# Patient Record
Sex: Male | Born: 1950 | Race: Black or African American | Hispanic: No | Smoking: Current some day smoker
Health system: Southern US, Community
[De-identification: ages and names within clinical notes are randomized; demographics above are authoritative.]

## PROBLEM LIST (undated history)

## (undated) DIAGNOSIS — E7800 Pure hypercholesterolemia, unspecified: Secondary | ICD-10-CM

## (undated) DIAGNOSIS — K219 Gastro-esophageal reflux disease without esophagitis: Secondary | ICD-10-CM

## (undated) DIAGNOSIS — C801 Malignant (primary) neoplasm, unspecified: Secondary | ICD-10-CM

## (undated) DIAGNOSIS — I10 Essential (primary) hypertension: Secondary | ICD-10-CM

## (undated) DIAGNOSIS — N189 Chronic kidney disease, unspecified: Secondary | ICD-10-CM

## (undated) DIAGNOSIS — J449 Chronic obstructive pulmonary disease, unspecified: Secondary | ICD-10-CM

## (undated) DIAGNOSIS — M1990 Unspecified osteoarthritis, unspecified site: Secondary | ICD-10-CM

## (undated) HISTORY — PX: KNEE SURGERY: SHX244

## (undated) HISTORY — PX: TOTAL SHOULDER ARTHROPLASTY: SHX126

## (undated) HISTORY — PX: VASCULAR SURGERY: SHX849

## (undated) HISTORY — PX: CARDIAC CATHETERIZATION: SHX172

## (undated) HISTORY — PX: COLON RESECTION: SHX5231

## (undated) HISTORY — PX: TOTAL HIP ARTHROPLASTY: SHX124

## (undated) HISTORY — PX: JOINT REPLACEMENT: SHX530

## (undated) HISTORY — PX: COLON SURGERY: SHX602

---

## 1999-02-28 ENCOUNTER — Emergency Department (HOSPITAL_COMMUNITY): Admission: EM | Admit: 1999-02-28 | Discharge: 1999-02-28 | Payer: Self-pay | Admitting: Emergency Medicine

## 1999-03-01 ENCOUNTER — Encounter: Payer: Self-pay | Admitting: Emergency Medicine

## 1999-03-01 ENCOUNTER — Emergency Department (HOSPITAL_COMMUNITY): Admission: EM | Admit: 1999-03-01 | Discharge: 1999-03-01 | Payer: Self-pay | Admitting: Emergency Medicine

## 1999-03-05 ENCOUNTER — Emergency Department (HOSPITAL_COMMUNITY): Admission: EM | Admit: 1999-03-05 | Discharge: 1999-03-05 | Payer: Self-pay | Admitting: Emergency Medicine

## 1999-04-23 ENCOUNTER — Ambulatory Visit (HOSPITAL_BASED_OUTPATIENT_CLINIC_OR_DEPARTMENT_OTHER): Admission: RE | Admit: 1999-04-23 | Discharge: 1999-04-24 | Payer: Self-pay | Admitting: Orthopedic Surgery

## 2001-12-15 ENCOUNTER — Encounter: Payer: Self-pay | Admitting: Surgery

## 2001-12-15 ENCOUNTER — Inpatient Hospital Stay (HOSPITAL_COMMUNITY): Admission: RE | Admit: 2001-12-15 | Discharge: 2001-12-18 | Payer: Self-pay | Admitting: Surgery

## 2004-05-12 ENCOUNTER — Ambulatory Visit: Payer: Self-pay | Admitting: Internal Medicine

## 2004-05-27 ENCOUNTER — Ambulatory Visit: Payer: Self-pay | Admitting: Internal Medicine

## 2004-09-12 ENCOUNTER — Ambulatory Visit: Payer: Self-pay | Admitting: Internal Medicine

## 2004-10-23 ENCOUNTER — Ambulatory Visit: Payer: Self-pay | Admitting: Internal Medicine

## 2005-01-15 ENCOUNTER — Ambulatory Visit: Payer: Self-pay | Admitting: Internal Medicine

## 2005-01-22 ENCOUNTER — Ambulatory Visit: Payer: Self-pay | Admitting: Internal Medicine

## 2007-08-31 ENCOUNTER — Emergency Department (HOSPITAL_COMMUNITY): Admission: EM | Admit: 2007-08-31 | Discharge: 2007-08-31 | Payer: Self-pay | Admitting: Emergency Medicine

## 2007-09-09 ENCOUNTER — Inpatient Hospital Stay (HOSPITAL_COMMUNITY): Admission: AD | Admit: 2007-09-09 | Discharge: 2007-09-11 | Payer: Self-pay | Admitting: Orthopedic Surgery

## 2009-10-10 ENCOUNTER — Ambulatory Visit: Payer: Self-pay | Admitting: Internal Medicine

## 2009-10-10 DIAGNOSIS — E785 Hyperlipidemia, unspecified: Secondary | ICD-10-CM | POA: Insufficient documentation

## 2009-10-10 DIAGNOSIS — R7309 Other abnormal glucose: Secondary | ICD-10-CM

## 2009-10-10 DIAGNOSIS — I10 Essential (primary) hypertension: Secondary | ICD-10-CM

## 2009-10-10 DIAGNOSIS — F528 Other sexual dysfunction not due to a substance or known physiological condition: Secondary | ICD-10-CM | POA: Insufficient documentation

## 2009-10-10 DIAGNOSIS — Z860100 Personal history of colon polyps, unspecified: Secondary | ICD-10-CM | POA: Insufficient documentation

## 2009-10-11 LAB — CONVERTED CEMR LAB
ALT: 20 U/L
AST: 22 U/L
Albumin: 3.6 g/dL
Alkaline Phosphatase: 59 U/L
BUN: 22 mg/dL
Bilirubin, Direct: 0.2 mg/dL
CO2: 24 meq/L
Calcium: 9 mg/dL
Chloride: 105 meq/L
Cholesterol: 112 mg/dL
Creatinine, Ser: 2.3 mg/dL — ABNORMAL HIGH
Direct LDL: 58.7 mg/dL
GFR calc non Af Amer: 30.71 mL/min
Glucose, Bld: 191 mg/dL — ABNORMAL HIGH
HDL: 26.2 mg/dL — ABNORMAL LOW
Hgb A1c MFr Bld: 7.1 % — ABNORMAL HIGH
PSA: 10.43 ng/mL — ABNORMAL HIGH
Potassium: 4.3 meq/L
Sodium: 139 meq/L
TSH: 0.96 u[IU]/mL
Testosterone: 274 ng/dL — ABNORMAL LOW
Total Bilirubin: 0.5 mg/dL
Total CHOL/HDL Ratio: 4
Total Protein: 6.3 g/dL
Triglycerides: 275 mg/dL — ABNORMAL HIGH
VLDL: 55 mg/dL — ABNORMAL HIGH

## 2009-11-01 ENCOUNTER — Ambulatory Visit: Payer: Self-pay | Admitting: Internal Medicine

## 2009-11-04 ENCOUNTER — Encounter: Payer: Self-pay | Admitting: Internal Medicine

## 2009-11-04 DIAGNOSIS — R972 Elevated prostate specific antigen [PSA]: Secondary | ICD-10-CM

## 2009-11-04 LAB — CONVERTED CEMR LAB
PSA, Free Pct: 18 — ABNORMAL LOW
PSA, Free: 2.1 ng/mL
PSA: 11.92 ng/mL — ABNORMAL HIGH

## 2009-11-06 ENCOUNTER — Ambulatory Visit: Payer: Self-pay | Admitting: Internal Medicine

## 2009-11-06 DIAGNOSIS — N183 Chronic kidney disease, stage 3 (moderate): Secondary | ICD-10-CM

## 2010-03-11 NOTE — Miscellaneous (Signed)
Summary: Urology Referral Order

## 2010-03-11 NOTE — Assessment & Plan Note (Signed)
Summary: PT WILL COME IN FASTING OK PER DOC/NJR   Vital Signs:  Patient profile:   60 year old male Height:      70 inches Weight:      228 pounds BMI:     32.83 Pulse rate:   80 / minute Pulse rhythm:   regular Resp:     12 per minute BP sitting:   152 / 88  (left arm) Cuff size:   regular  Vitals Entered By: Gladis Riffle, RN Nov 09, 2009 10:01 AM)  CC: to re-establish and have ears checked for itching Is Patient Diabetic? No Comments medication list is from import list, pt not sure names but takes BP med, cholesterol med, and prediabetes med   CC:  to re-establish and have ears checked for itching.  History of Present Illness: I have not seen in several years DM---tolerating meds---has had some f/u at [facility]: tolerating meds  Lipids: tolerating meds, needs f/u  ED: requests more viagra  All other systems reviewed and were negative   Preventive Screening-Counseling & Management  Alcohol-Tobacco     Smoking Status: current     Packs/Day: <0.25  Comments: 1-2 cigs per day  Current Problems (verified): 1)  Erectile Dysfunction  (ICD-302.72) 2)  Preventive Health Care  (ICD-V70.0) 3)  Colonic Polyps, Hx of  (ICD-V12.72) 4)  Prediabetes  (ICD-790.29) 5)  Hypertension  (ICD-401.9) 6)  Hyperlipidemia  (ICD-272.4)  Current Medications (verified): 1)  Lisinopril 40 Mg Tabs (Lisinopril) .... Take 1 Tablet By Mouth Once A Day 2)  Simvastatin 40 Mg Tabs (Simvastatin) .... Take 1 Tablet By Mouth At Bedtime 3)  Metformin Hcl 500 Mg Tabs (Metformin Hcl) .... 1/2 Tablet Once A Day 4)  Viagra 100 Mg Tabs (Sildenafil Citrate) .... One Tablet As Needed  Allergies (verified): No Known Drug Allergies  Past History:  Family History: Last updated: 11-09-2009 father deceased age 78 yo stroke Mother deceased CHF age 44  Social History: Last updated: 11/09/2009 disability Divorced 4 dtrs healthy Current Smoker 1ppd  Risk Factors: Smoking Status: current  (11/09/2009) Packs/Day: <0.25 (11-09-2009)  Past Medical History: Hyperlipidemia Hypertension prediabetic Colonic polyps, hx of  Past Surgical History: Colon polypectomy Total hip replacement--bilateral Inguinal herniorrhaphy  Family History: father deceased age 71 yo stroke Mother deceased CHF age 31  Social History: disability Divorced 4 dtrs healthy Current Smoker 1ppdSmoking Status:  current Packs/Day:  <0.25  Physical Exam  General:  alert and well-developed.   Head:  normocephalic and atraumatic.   Eyes:  pupils equal and pupils round.   Ears:  R ear normal and L ear normal.   Lungs:  normal respiratory effort and no intercostal retractions.   Heart:  normal rate and regular rhythm.   Abdomen:  soft and non-tender.   Msk:  No deformity or scoliosis noted of thoracic or lumbar spine.   Neurologic:  cranial nerves II-XII intact and gait normal.   Skin:  turgor normal and color normal.   Psych:  good eye contact and not anxious appearing.     Impression & Recommendations:  Problem # 1:  PREDIABETES (ICD-790.29) check labs His updated medication list for this problem includes:    Metformin Hcl 500 Mg Tabs (Metformin hcl) .Marland Kitchen... 1/2 tablet once a day  Orders: TLB-A1C / Hgb A1C (Glycohemoglobin) (83036-A1C)  Problem # 2:  HYPERTENSION (ICD-401.9) not controlled see meds side effects discussed His updated medication list for this problem includes:    Lisinopril 40 Mg Tabs (  Lisinopril) .Marland Kitchen... Take 1 tablet by mouth once a day  BP today: 152/88  Problem # 3:  HYPERLIPIDEMIA (ICD-272.4) check labs today His updated medication list for this problem includes:    Simvastatin 40 Mg Tabs (Simvastatin) .Marland Kitchen... Take 1 tablet by mouth at bedtime  Orders: Venipuncture (66440) TLB-Lipid Panel (80061-LIPID) TLB-BMP (Basic Metabolic Panel-BMET) (80048-METABOL) TLB-Hepatic/Liver Function Pnl (80076-HEPATIC) TLB-TSH (Thyroid Stimulating Hormone) (84443-TSH)  Problem #  4:  ERECTILE DYSFUNCTION (ICD-302.72) check labs Orders: TLB-Testosterone, Total (84403-TESTO)  Complete Medication List: 1)  Lisinopril 40 Mg Tabs (Lisinopril) .... Take 1 tablet by mouth once a day 2)  Simvastatin 40 Mg Tabs (Simvastatin) .... Take 1 tablet by mouth at bedtime 3)  Metformin Hcl 500 Mg Tabs (Metformin hcl) .... 1/2 tablet once a day 4)  Viagra 100 Mg Tabs (Sildenafil citrate) .... One tablet as needed 5)  Viagra 100 Mg Tabs (Sildenafil citrate) .... 1/2-1 by mouth 30 minutes prior to sexual intercourse 6)  Cipro 500 Mg Tabs (Ciprofloxacin hcl) .... Two times a day for 10 days  Other Orders: Tdap => 51yrs IM (34742) Admin 1st Vaccine (59563) TLB-PSA (Prostate Specific Antigen) (84153-PSA)  Patient Instructions: 1)  Please schedule a follow-up appointment in 4 months. medicare wellness visit Prescriptions: VIAGRA 100 MG TABS (SILDENAFIL CITRATE) 1/2-1 by mouth 30 minutes prior to sexual intercourse  #10 x 1   Entered and Authorized by:   Birdie Sons MD   Signed by:   Birdie Sons MD on 10/10/2009   Method used:   Print then Give to Patient   RxID:   [id number]   Prevention & Chronic Care Immunizations   Influenza vaccine: Not documented    Tetanus booster: 10/10/2009: Tdap    Pneumococcal vaccine: Not documented    H. zoster vaccine: Not documented  Colorectal Screening   Hemoccult: Not documented    Colonoscopy: Not documented  Other Screening   PSA: Not documented   PSA ordered.   Smoking status: current  (10/10/2009)  Lipids   Total Cholesterol: Not documented   LDL: Not documented   LDL Direct: Not documented   HDL: Not documented   Triglycerides: Not documented    SGOT (AST): Not documented   SGPT (ALT): Not documented   Alkaline phosphatase: Not documented   Total bilirubin: Not documented  Hypertension   Last Blood Pressure: 152 / 88  (10/10/2009)   Serum creatinine: Not documented   Serum potassium Not  documented  Self-Management Support :    Hypertension self-management support: Not documented    Lipid self-management support: Not documented     Immunizations Administered:  Tetanus Vaccine:    Vaccine Type: Tdap    Site: left deltoid    Mfr: GlaxoSmithKline    Dose: 0.5 ml    Route: IM    Given by: Gladis Riffle, RN    Exp. Date: 11/29/2011    Lot #: [id number]    VIS given: 12/28/07 version given October 10, 2009.

## 2010-03-11 NOTE — Assessment & Plan Note (Signed)
Summary: FU PSA LABS/PS   Vital Signs:  Patient profile:   60 year old male Height:      70 inches (177.80 cm) Weight:      216.50 pounds (98.41 kg) BMI:     31.18 Temp:     98.7 degrees F (37.06 degrees C) oral BP sitting:   130 / 70  (left arm) Cuff size:   large  Vitals Entered By: Lucious Groves CMA (November 06, 2009 9:15 AM) CC: Follow-up PSA labs./kb Is Patient Diabetic? Yes Pain Assessment Patient in pain? no      Comments Patient decline flu [facility]. [facility]   CC:  Follow-up PSA labs./kb.  History of Present Illness: elevated psa--see a/p  Current Medications (verified): 1)  Lisinopril 40 Mg Tabs (Lisinopril) .... Take 1 Tablet By Mouth Once A Day 2)  Crestor 20 Mg Tabs (Rosuvastatin Calcium) .... 0.5 Tab By Mouth Qhs 3)  Glyburide 5 Mg Tabs (Glyburide) .... 2 By Mouth Bid 4)  Viagra 100 Mg Tabs (Sildenafil Citrate) .... One Tablet As Needed 5)  Viagra 100 Mg Tabs (Sildenafil Citrate) .... 1/2-1 By Mouth 30 Minutes Prior To Sexual Intercourse 6)  Hydrocodone-Acetaminophen 5-500 Mg Tabs (Hydrocodone-Acetaminophen) .Marland Kitchen.. 1 By Mouth Two Times A Day Prn 7)  Norvasc 10 Mg Tabs (Amlodipine Besylate) .... 0.5 Tab By Mouth Qd  Allergies (verified): No Known Drug Allergies   Impression & Recommendations:  Problem # 1:  ELEVATED PROSTATE SPECIFIC ANTIGEN (ICD-790.93) discussed at length---have made appt with urology will copy labs and [facility] all [facility] counseling about elevated psa---18 minutes  Complete Medication List: 1)  Lisinopril 40 Mg Tabs (Lisinopril) .... Take 1 tablet by mouth once a day 2)  Crestor 20 Mg Tabs (Rosuvastatin calcium) .... 0.5 tab by mouth qhs 3)  Glyburide 5 Mg Tabs (Glyburide) .... 2 by mouth bid 4)  Viagra 100 Mg Tabs (Sildenafil citrate) .... One tablet as needed 5)  Viagra 100 Mg Tabs (Sildenafil citrate) .... 1/2-1 by mouth 30 minutes prior to sexual intercourse 6)   Hydrocodone-acetaminophen 5-500 Mg Tabs (Hydrocodone-acetaminophen) .Marland Kitchen.. 1 by mouth two times a day prn 7)  Norvasc 10 Mg Tabs (Amlodipine besylate) .... 0.5 tab by mouth qd   Not Administered:    Influenza Vaccine # 1 not given due to: declined

## 2010-06-24 NOTE — Op Note (Signed)
[name], [name]                   ACCOUNT NO.:  [id number]   MEDICAL RECORD NO.:  [id number]          PATIENT TYPE:  OIB   LOCATION:  0098                         FACILITY:  [facility]   PHYSICIAN:  Ollen Gross, M.D.    DATE OF BIRTH:  Nov 22, 1950   DATE OF PROCEDURE:  09/08/2007  DATE OF DISCHARGE:                               OPERATIVE REPORT   PREOPERATIVE DIAGNOSIS:  Left tibial plateau fracture.   POSTOPERATIVE DIAGNOSIS:  Left tibial plateau fracture.   OPERATION/PROCEDURE:  Open reduction and internal fixation, left tibial  plateau fracture.   SURGEON:  Ollen Gross, M.D.   ASSISTANT:  Alexzandrew L. Perkins, P.A.C.   ANESTHESIA:  General.   ESTIMATED BLOOD LOSS:  Minimal.   DRAINS:  None.   TOURNIQUET TIME:  41 minutes at 300 mmHg.   COMPLICATIONS:  None.   CONDITION:  Stable to recovery.   BRIEF CLINICAL NOTE:  [name] is a 60 year old male who sustained a  displaced left tibial plateau fracture approximately a week ago.  He had  moderate swelling initially.  It is felt that we had to let the swelling  go down prior to doing the surgery.  The swelling is adequately reduced  and he presents now for open reduction and internal fixation of  displaced intra-articular comminuted fracture.   PROCEDURE IN DETAIL:  After successful administration of general  anesthetic, a tourniquet was placed high on his left thigh and left  lower extremity was prepped and draped in the usual sterile fashion.  Extremity wrapped in Esmarch and tourniquet inflated to 300 mmHg.  A  right angle incision was made starting distally along the tibial crest,  coursing proximal to the joint line and coursing posterolaterally with  the fibular head.  Skin cut with a 10 blade through subcutaneous tissue.  Subcutaneous flap was elevated.  I then incised the periosteum and  subtalar periosteum, elevated tissue to reveal the bony structures.  There is a fracture of the anterolateral aspect and  another  fragment  posterolaterally.  I elevated the lateral meniscus to see the joint and  there was a depression of about 3-4 mm.  Elevated joint and packed  cancellus bone graft underneath to maintain that height.  The trial for  the long poly Ace locking plate is placed and a 2-hole plate is felt to  be sufficient.  Under fluoroscopic guidance, we reduced the fracture and  placed the plate and then placed one locking screw proximally and one  locking screw distally to hold the plate in place.  We fluoroscoped AP  and multiple obliques and lateral showed excellent reduction of this  fracture.  I then placed another locking screw proximally at the joint  lines, one just below that, 5.5 mm, then another locking screw halfway  down the plate coursing superomedially and then a cortical screw with  the distal-most hole in the plate.  All the screws had excellent  purchase.  We placed the knee through a range of motion and the segment  was very stable.  There is also posterolateral fragment that  was  affected that could not be captured by the plate.  I placed a guide pin  for a 6.5 mm cannulated screw through this fragment, reduced it and then  passed a guide pin all the way to the medial cortex of the tibia.  The  length is 80 mm.  An 80 mm 6.55 cannulated screw was placed with a  washer over the guide pin and tightened down with excellent purchase.  This really reduced the fracture nicely.  The guide pin is then removed.  Fluoroscopy views taken AP, two obliques and lateral showing near  anatomic reduction of this fracture.  The wound is then copiously  irrigated with saline solution.  The meniscus repaired with an  interrupted #1 Vicryl.  The periosteal flap is placed over the plate and  then the edges are repaired with interrupted #1 Vicryl.  This completely  covered the plate.  The tourniquet released.  Total time of 41 minutes.  Minimal bleeding was encountered.  Wounds further  irrigated and  subcutaneous tissue closed with interrupted 2-0 Vicryl and skin with  staples.  The incisions cleaned and dried and a bulky sterile dressing  applied.  He is placed back into a knee immobilizer, awakened and  transferred to recovery in stable condition.      Ollen Gross, M.D.  Electronically Signed     FA/MEDQ  D:  09/08/2007  T:  09/08/2007  Job:  [id number]

## 2010-06-27 NOTE — Op Note (Signed)
NAMEVON, QUINTANAR                             ACCOUNT NO.:  [id number]   MEDICAL RECORD NO.:  [id number]                   PATIENT TYPE:  INP   LOCATION:  0457                                 FACILITY:  [facility]   PHYSICIAN:  Sandria Bales. Ezzard Standing, M.D.               DATE OF BIRTH:  [birth year]   DATE OF PROCEDURE:  12/15/2001  DATE OF DISCHARGE:                                 OPERATIVE REPORT   PREOPERATIVE DIAGNOSIS:  Ventral incisional hernia.   POSTOPERATIVE DIAGNOSIS:  8 x 13 cm ventral incisional hernia.   PROCEDURE:  Laparoscopic repair of ventral incisional hernia with  enterolysis of adhesions and placement of 16 x 21 cm dual mesh.   SURGEON:  Sandria Bales. Ezzard Standing, M.D.   FIRST ASSISTANT:  Anselm Pancoast. Zachery Dakins, M.D.   ANESTHESIA:  General endotracheal.   ESTIMATED BLOOD LOSS:  50 cc.   DRAINS:  None.   HISTORY OF PRESENT ILLNESS:  Mr. Tanney is a 60 year old black male whose been  treated both in [place] and in the [place]. His most recent operation  was a laparotomy for removal of a benign colonic lesion at the [facility]. He  has developed an upper abdominal would incisional hernia which is to the  left of the umbilicus, to the left of his midline incision and probably 4 or  5 cm in diameter. Extensively counselled the patient about open versus  laparoscopic hernia repair. He now comes to the operating room for an  attempted laparoscopic ventral incisional hernia repair.   DESCRIPTION OF PROCEDURE:  The patient was given 1 gm of Ancef at the  initiation of the procedure, he had had a Foley catheter in place. His arms  were tucked by his side. He has PAS stockings in place and underwent general  endotracheal anesthesia. His abdomen was prepped with Betadine solution and  then sterilely draped and an Ioban drape was placed on the abdominal wall. I  then went through a left upper quadrant incision and got into the abdominal  cavity in an open manner. I placed a zero degree  scope through a 10 mm  Hasson trocar. The Hasson trocar was secured with a #0 Vicryl suture. I then  carried out a laparoscopic exploration. He had a lot of omentum stuck to the  midline of his incision. I could get around to the pelvis fairly easily. I  placed three additional trocars, the left lower quadrant a 5 mm trocar, the  right lower quadrant a 5 mm trocar and the right upper quadrant a 5 mm  trocar. I probably spent a good 45 minutes lysing adhesions and taking the  omentum down from the anterior abdominal wall. I ran into a few little  bleeders which controlled with Bovie electrocautery but I thought I was well  away from the bowel during this dissection. After the omentum had been taken  down, I inspected the omentum and bowel and there was a little bit of blood,  there was no active bleeding and there was no obvious bowel injury. I then  could identify what was a Swiss cheese defect of the abdominal wall. Using  the spinal needle, I measured this as an approximately 8 cm x 13 cm hernia  in the upper midline incision. I then tried to go in 3-4 cm in all  directions around the hernia and this was measured out to about 16 x 21 cm  and used a Gore-Tex dual mesh of that size cut to the dimensions of the  hernia. I then placed eight #0 Novofil sutures in a clockwise fashion around  the mesh. I inserted into the abdominal cavity. I was able to grab each of  the mesh with an Endocatch suture grabber continuously in a clockwise  fashion. Then we cinched the mesh up to the anterior abdominal wall and tied  it to the mesh in the stab wounds in the abdominal wall that we had done.   I then used the [name] surgical tacker and tacked 34 tacks around the edges of  this mesh and felt an outer rim that was about every centimeter and a half  on the outer edges and then an inner rim on the mesh tacked it down  smoothly. The mesh lay smoothly. I thought all the edges were close so there  was no way  small bowel could slip around for a bowel obstruction. I looked  at the bowel and the mesentery had been taken down and this again looked  good. I then removed each trocar under direct visualization. I actually used  an Endoclose to close the left upper quadrant trocar under direct  visualization. The other trocars I just removed. I painted the larger  incision and closed it with 5-0 Monocryl suture. The smaller incisions I  just closed with tinctured Benzoin and Steri-Strips and sterilely dressed  all the wounds. The patient was transferred to recovery room in good  condition wearing an abdominal binder. Mr. Hing tolerated the procedure well.  Sponge and needle count were correct at the end of the case. His estimated  blood loss was 50 cc.                                               Sandria Bales. Ezzard Standing, M.D.    DHN/MEDQ  D:  12/15/2001  T:  12/16/2001  Job:  [id number]
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
## 2010-06-27 NOTE — Discharge Summary (Signed)
NAMEVINCENZO, [name]                             ACCOUNT NO.:  [id number]   MEDICAL RECORD NO.:  [id number]                   PATIENT TYPE:  INP   LOCATION:  0457                                 FACILITY:  [facility]   PHYSICIAN:  Sandria Bales. Ezzard Standing, M.D.               DATE OF BIRTH:  [birth year]   DATE OF ADMISSION:  12/15/2001  DATE OF DISCHARGE:  12/18/2001                                 DISCHARGE SUMMARY   DISCHARGE DIAGNOSES:  1. Ventral incisional hernia approximately 8 x 13-cm in size.  2. 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  3. History of cigarette smoking.  4. Hypertension.  5. Benign prostatic hypertrophy.   OPERATION:  The patient underwent a laparoscopic ventral hernia repair on  12/15/2001.   HISTORY OF PRESENT ILLNESS:  [name] is a 60 year old black male who has no  true identified physician in [place] that he sees.  He is treated through  the [facility].  I did a right inguinal hernia on him in 1997.  However, in  April of this year, 2003, in [place] he was found to have blood in his stool.  He apparently was found to have a colonic polyp for which he had a  laparotomy and resection of the polyp and a piece of his colon.  He  subsequently developed an abdominal wound incisional hernia and presented  with complaints of discomfort and enlargement of this hernia and wants to  have this hernia repaired.   ALLERGIES:  He has no allergies.   MEDICATIONS:  1. Aleve p.r.n.  2. Nifedipine 30 mg daily.  3. Terazosin 2 mg nightly.   PAST MEDICAL HISTORY:  __________ significant symptoms.  He smokes  cigarettes and has a history of hypertension.   PHYSICAL EXAMINATION:  On physical exam he has a bulging out to the left of  his midline which is approximately 4 cm in diameter on physical exam, felt  to be an abdominal wall hernia.   HOSPITAL COURSE:  On the day of admission he was taken to the operating room  where he underwent a laparoscopic  exploration and repair of a ventral  incisional hernia.  Actually, the hernia was approximately 8 x 13-cm in  total size and was more of a Swiss cheese but had multiple defects rather  than one large defect.  Over this I placed a 16 x 21 cm dual Gore-Tex mesh.   Postoperatively the patient did well.  By his first postoperative day he was  up moving.  His blood pressure was well-controlled.  By the third  postoperative day he had tolerated his diet, he had passed some gas.  He  felt comfortable enough to go home.  He was sent home on an abdominal  binder.   He was given Tylox for pain.  He is to do no strenuous activity.  He can  wear his binder for at least one month.  He is going to see me in one to two  weeks for followup.  Call for any interval problem.  He is to do no heavy  lifting for four weeks.  No driving for two or three days until comfortable.                                               Sandria Bales. Ezzard Standing, M.D.    DHN/MEDQ  D:  12/28/2001  T:  12/28/2001  Job:  [id number]   cc:   [facility]

## 2010-11-07 LAB — URINALYSIS, ROUTINE W REFLEX MICROSCOPIC
Glucose, UA: 100 — AB
Leukocytes, UA: NEGATIVE
pH: 6

## 2010-11-07 LAB — GLUCOSE, CAPILLARY
Glucose-Capillary: 106 — ABNORMAL HIGH
Glucose-Capillary: 142 — ABNORMAL HIGH
Glucose-Capillary: 162 — ABNORMAL HIGH
Glucose-Capillary: 198 — ABNORMAL HIGH
Glucose-Capillary: 108 — ABNORMAL HIGH
Glucose-Capillary: 155 — ABNORMAL HIGH

## 2010-11-07 LAB — COMPREHENSIVE METABOLIC PANEL
ALT: 21
AST: 28
Albumin: 3.5
Alkaline Phosphatase: 56
Chloride: 107
GFR calc Af Amer: 43 — ABNORMAL LOW
Potassium: 4.4
Sodium: 139
Total Protein: 6.9

## 2010-11-07 LAB — HEMOGLOBIN AND HEMATOCRIT, BLOOD
HCT: 35.3 — ABNORMAL LOW
Hemoglobin: 11.7 — ABNORMAL LOW
Hemoglobin: 13.2

## 2010-11-07 LAB — CBC
Platelets: 290
RDW: 14.6
WBC: 10

## 2010-11-07 LAB — PROTIME-INR
INR: 1
INR: 1.7 — ABNORMAL HIGH
Prothrombin Time: 14.2

## 2010-11-07 LAB — URINE MICROSCOPIC-ADD ON

## 2011-05-18 ENCOUNTER — Emergency Department (HOSPITAL_COMMUNITY)
Admission: EM | Admit: 2011-05-18 | Discharge: 2011-05-18 | Disposition: A | Discharge status: Home / Self Care | Payer: Medicare Other | Attending: Emergency Medicine | Admitting: Emergency Medicine

## 2011-05-18 ENCOUNTER — Encounter (HOSPITAL_COMMUNITY): Payer: Self-pay | Admitting: Emergency Medicine

## 2011-05-18 DIAGNOSIS — E7800 Pure hypercholesterolemia, unspecified: Secondary | ICD-10-CM | POA: Diagnosis not present

## 2011-05-18 DIAGNOSIS — F17200 Nicotine dependence, unspecified, uncomplicated: Secondary | ICD-10-CM | POA: Diagnosis not present

## 2011-05-18 DIAGNOSIS — E119 Type 2 diabetes mellitus without complications: Secondary | ICD-10-CM | POA: Insufficient documentation

## 2011-05-18 DIAGNOSIS — I10 Essential (primary) hypertension: Secondary | ICD-10-CM | POA: Diagnosis not present

## 2011-05-18 DIAGNOSIS — L209 Atopic dermatitis, unspecified: Secondary | ICD-10-CM

## 2011-05-18 DIAGNOSIS — L2089 Other atopic dermatitis: Secondary | ICD-10-CM | POA: Insufficient documentation

## 2011-05-18 HISTORY — DX: Essential (primary) hypertension: I10

## 2011-05-18 HISTORY — DX: Pure hypercholesterolemia, unspecified: E78.00

## 2011-05-18 MED ORDER — DIPHENHYDRAMINE HCL 25 MG PO TABS: 25.0000 mg | ORAL_TABLET | Freq: Three times a day (TID) | ORAL | Status: DC | PRN

## 2011-05-18 MED ORDER — HYDROCORTISONE 1 % EX CREA: TOPICAL_CREAM | CUTANEOUS | Status: AC

## 2011-05-18 NOTE — ED Notes (Signed)
States no new meds. But has recently changed body soaps

## 2011-05-18 NOTE — ED Provider Notes (Signed)
History     CSN: [id number]  Arrival date & time 05/18/11  1245   First MD Initiated Contact with Patient 05/18/11 1311      Chief Complaint  Patient presents with  . Rash    (Consider location/radiation/quality/duration/timing/severity/associated sxs/prior treatment) HPI Comments: Patient reports pruritic rash to right neck, upper back, left lower leg, and left upper thigh.  The rash began about 10 days ago. States that he did use new towels about 2 weeks ago without washing them.  Denies any other change in personal hygiene products, detergents, colognes, or any environmental exposures.  Denies fevers, CP, SOB, abdominal pain.  States he otherwise feels very well.    Patient is a 61 y.o. male presenting with rash. The history is provided by the patient.  Rash     Past Medical History  Diagnosis Date  . Diabetes mellitus   . Hypertension   . High cholesterol     Past Surgical History  Procedure Date  . Total hip arthroplasty     bilat  . Knee surgery   . Colon resection   . Total shoulder arthroplasty     right    History reviewed. No pertinent family history.  History  Substance Use Topics  . Smoking status: Current Some Day Smoker    Types: Cigarettes  . Smokeless tobacco: Not on file  . Alcohol Use: No      Review of Systems  Constitutional: Negative for fever and fatigue.  Respiratory: Negative for cough and shortness of breath.   Cardiovascular: Negative for chest pain.  Gastrointestinal: Negative for abdominal pain.  Skin: Positive for rash.  All other systems reviewed and are negative.    Allergies  Review of patient's allergies indicates no known allergies.  Home Medications   Current Outpatient Rx  Name Route Sig Dispense Refill  . AMLODIPINE BESYLATE 10 MG PO TABS Oral Take 10 mg by mouth daily.    . GLYBURIDE 5 MG PO TABS Oral Take 5 mg by mouth daily with breakfast.    . LISINOPRIL 40 MG PO TABS Oral Take 40 mg by mouth daily.    Marland Kitchen  ROSUVASTATIN CALCIUM 20 MG PO TABS Oral Take 10 mg by mouth daily.      BP 144/74  Pulse 84  Temp(Src) 98.6 F (37 C) (Oral)  Resp 20  SpO2 96%  Physical Exam  Nursing note and vitals reviewed. Constitutional: He is oriented to person, place, and time. He appears well-developed and well-nourished.  HENT:  Head: Normocephalic and atraumatic.  Neck: Neck supple.  Cardiovascular: Normal rate, regular rhythm and normal heart sounds.   Pulmonary/Chest: Breath sounds normal. No respiratory distress. He has no wheezes. He has no rales. He exhibits no tenderness.  Abdominal: Soft. Bowel sounds are normal. There is no tenderness.  Neurological: He is alert and oriented to person, place, and time.  Skin: Rash noted.       ED Course  Procedures (including critical care time)  Labs Reviewed - No data to display No results found.   1. Atopic dermatitis       MDM  Afebrile, nontoxic patient with pruritic rash on small areas around his body.  No e/o infestation, no e/o systemic illness.  Pt has not tried anything for his symptoms.  Pt d/c home with benadryl and hydrocortisone cream.  Patient verbalizes understanding and agrees with plan.          Rise Patience, [place] 05/18/11 1355

## 2011-05-18 NOTE — ED Notes (Signed)
Pt repots rash starting on his neck about a week ago and spreading to his left arm and leg and back and groin area, itches sometimes, doesn't know anyone else having rash, denies new medications, started using dial b/c previous soap made him itch, denies new shampoos or laundry detergents.

## 2011-05-18 NOTE — Discharge Instructions (Signed)
Please follow up with your primary care provider.  Return to the ER immediately if you develop redness, swelling, pus draining from the wound, or fevers greater than 100.4.  You may return to the ER at any time for worsening condition or any new symptoms that concern you.  Contact Dermatitis Contact dermatitis is a rash that happens when something touches the skin. You touched something that irritates your skin, or you have allergies to something you touched. HOME CARE   Avoid the thing that caused your rash.   Keep your rash away from hot water, soap, sunlight, chemicals, and other things that might bother it.   Do not scratch your rash.   You can take cool baths to help stop itching.   Only take medicine as told by your doctor.   Keep all doctor visits as told.  GET HELP RIGHT AWAY IF:   Your rash is not better after 3 days.   Your rash gets worse.   Your rash is puffy (swollen), tender, red, sore, or warm.   You have problems with your medicine.  MAKE SURE YOU:   Understand these instructions.   Will watch your condition.   Will get help right away if you are not doing well or get worse.  Document Released: 11/23/2008 Document Revised: 01/15/2011 Document Reviewed: 07/01/2010 [facility] Patient Information 2012 [place], [place].

## 2011-05-19 NOTE — ED Provider Notes (Signed)
Medical screening examination/treatment/procedure(s) were performed by non-physician practitioner and as supervising physician I was immediately available for consultation/collaboration.  Kodi Guerrera, MD 05/19/11 2317 

## 2011-12-04 ENCOUNTER — Other Ambulatory Visit: Payer: Self-pay | Admitting: Internal Medicine

## 2011-12-04 NOTE — Telephone Encounter (Signed)
Medication refill from med 4 home in MD folder

## 2012-01-24 ENCOUNTER — Encounter (HOSPITAL_COMMUNITY): Payer: Self-pay | Admitting: Emergency Medicine

## 2012-01-24 ENCOUNTER — Emergency Department (INDEPENDENT_AMBULATORY_CARE_PROVIDER_SITE_OTHER)
Admission: EM | Admit: 2012-01-24 | Discharge: 2012-01-24 | Disposition: A | Discharge status: Home / Self Care | Payer: Medicare Other | Source: Home / Self Care | Attending: Emergency Medicine | Admitting: Emergency Medicine

## 2012-01-24 DIAGNOSIS — L2089 Other atopic dermatitis: Secondary | ICD-10-CM

## 2012-01-24 DIAGNOSIS — IMO0002 Reserved for concepts with insufficient information to code with codable children: Secondary | ICD-10-CM

## 2012-01-24 DIAGNOSIS — L209 Atopic dermatitis, unspecified: Secondary | ICD-10-CM

## 2012-01-24 DIAGNOSIS — S30811A Abrasion of abdominal wall, initial encounter: Secondary | ICD-10-CM

## 2012-01-24 MED ORDER — BACITRACIN ZINC 500 UNIT/GM EX OINT: TOPICAL_OINTMENT | Freq: Two times a day (BID) | CUTANEOUS | Status: DC

## 2012-01-24 MED ORDER — AQUAPHOR EX OINT: TOPICAL_OINTMENT | CUTANEOUS | Status: DC | PRN

## 2012-01-24 MED ORDER — TRIAMCINOLONE ACETONIDE 0.1 % EX CREA: TOPICAL_CREAM | Freq: Two times a day (BID) | CUTANEOUS | Status: DC

## 2012-01-24 NOTE — ED Notes (Signed)
Reports rash to arms and back, onset one week ago.  Patient reports itching

## 2012-01-24 NOTE — ED Provider Notes (Signed)
History     CSN: [id number]  Arrival date & time 01/24/12  1138   First MD Initiated Contact with Patient 01/24/12 1245      Chief Complaint  Patient presents with  . Rash    (Consider location/radiation/quality/duration/timing/severity/associated sxs/prior treatment) HPI Comments: Patient presents urgent care complaining of a somewhat itchy rash that appears on both of his upper arms mainly in near his elbows and the posterior aspect of both of his upper extremities some on his upper back it had been bothering him for about a week. He denies having used any new lotions or creams although he does admit that he applies and frequently. Also has this new lead develop tenderness on his left inguinal area and feels like the little bump is somewhat tender at touch. Patient denies any systemic symptoms such as fevers generalized malaise arthralgias or myalgias. Denies any allergy symptoms such as upper respiratory symptoms as a runny nose sneezing or sore throat.   Patient is a 61 y.o. male presenting with rash. The history is provided by the patient.  Rash  This is a new problem. The current episode started more than 1 week ago. The problem has not changed since onset.There has been no fever. The rash is present on the torso, right arm and left arm. The patient is experiencing no pain. The pain has been constant since onset. Associated symptoms include blisters and itching. Pertinent negatives include no pain and no weeping. He has tried nothing for the symptoms. The treatment provided no relief.    Past Medical History  Diagnosis Date  . Diabetes mellitus   . Hypertension   . High cholesterol     Past Surgical History  Procedure Date  . Total hip arthroplasty     bilat  . Knee surgery   . Colon resection   . Total shoulder arthroplasty     right    No family history on file.  History  Substance Use Topics  . Smoking status: Current Some Day Smoker    Types: Cigarettes  .  Smokeless tobacco: Not on file  . Alcohol Use: No      Review of Systems  Constitutional: Negative for fever, chills, diaphoresis, activity change and fatigue.  HENT: Negative for facial swelling, neck pain and neck stiffness.   Gastrointestinal: Negative for rectal pain.  Musculoskeletal: Negative for myalgias, back pain, joint swelling and arthralgias.  Skin: Positive for itching and rash. Negative for color change, pallor and wound.    Allergies  Review of patient's allergies indicates no known allergies.  Home Medications   Current Outpatient Rx  Name  Route  Sig  Dispense  Refill  . AMLODIPINE BESYLATE 10 MG PO TABS   Oral   Take 10 mg by mouth daily.         . GLYBURIDE 5 MG PO TABS   Oral   Take 5 mg by mouth daily with breakfast.         . HYDROCORTISONE 1 % EX CREA      Apply to affected area 2 times daily   15 g   0   . LISINOPRIL 40 MG PO TABS   Oral   Take 40 mg by mouth daily.         Marland Kitchen ROSUVASTATIN CALCIUM 20 MG PO TABS   Oral   Take 10 mg by mouth daily.         Marland Kitchen BACITRACIN ZINC 500 UNIT/GM EX OINT   Topical  Apply topically 2 (two) times daily. Apply bid affected area for 7 days   120 g   0   . DIPHENHYDRAMINE HCL 25 MG PO TABS   Oral   Take 1 tablet (25 mg total) by mouth every 8 (eight) hours as needed for itching.   20 tablet   0   . AQUAPHOR EX OINT   Topical   Apply topically as needed for dry skin. Apply daily after showers   420 g   0   . TRIAMCINOLONE ACETONIDE 0.1 % EX CREA   Topical   Apply topically 2 (two) times daily. Apply bid affected  areas   30 g   0     BP 135/72  Pulse 73  Temp 97.8 F (36.6 C) (Oral)  Resp 17  SpO2 97%  Physical Exam  Nursing note and vitals reviewed. Constitutional: Vital signs are normal. He appears well-developed and well-nourished.  Non-toxic appearance. He does not have a sickly appearance. He does not appear ill. No distress.  Pulmonary/Chest: Effort normal and breath  sounds normal.  Skin: Skin is warm. Rash noted. There is erythema.       ED Course  Procedures (including critical care time)  Labs Reviewed - No data to display No results found.   1. Atopic dermatitis and related condition   2. Abrasion of groin       MDM  Vague global mildly pruritic rash to the dorsal aspect of both his upper extremities upper back consistent with a topic dermatitis. Patient also has a new lead develop rash in his left inguinal region with tenderness appears to be superficial abrasions with some linear character to it so suspect trauma induced or friction. Prescribe both a course of steroid cream for 2 weeks to be a topic dermatitis along with aggressive skin hydration with Aquaphor ointment after showers. And a topical antibiotic to the newly develop left groin rash. Exam was not consistent with intertriginous candida gaseous or a localized infection such as a partial or folliculitis etc. Patient agree with treatment plan and followup care as necessary. This is his second visit to urgent care related to dermatological conditions I also advised him to followup with primary care Dr.       Jimmie Molly, MD 01/24/12 [phone]

## 2013-12-05 ENCOUNTER — Emergency Department (INDEPENDENT_AMBULATORY_CARE_PROVIDER_SITE_OTHER)
Admission: EM | Admit: 2013-12-05 | Discharge: 2013-12-05 | Disposition: A | Discharge status: Home / Self Care | Payer: Medicare Other | Source: Home / Self Care | Attending: Family Medicine | Admitting: Family Medicine

## 2013-12-05 ENCOUNTER — Encounter (HOSPITAL_COMMUNITY): Payer: Self-pay | Admitting: Emergency Medicine

## 2013-12-05 DIAGNOSIS — L0390 Cellulitis, unspecified: Secondary | ICD-10-CM | POA: Diagnosis not present

## 2013-12-05 DIAGNOSIS — B9562 Methicillin resistant Staphylococcus aureus infection as the cause of diseases classified elsewhere: Secondary | ICD-10-CM | POA: Diagnosis not present

## 2013-12-05 MED ORDER — MINOCYCLINE HCL 100 MG PO CAPS: 100.0000 mg | ORAL_CAPSULE | Freq: Two times a day (BID) | ORAL | Status: DC

## 2013-12-05 NOTE — ED Notes (Signed)
Pt states that he was biten by spider on right forearm and left leg. Pt is in no acute distress at this time.

## 2013-12-05 NOTE — Discharge Instructions (Signed)
Wash with sponge as adivised, take all of medicine, return if needed.

## 2013-12-05 NOTE — ED Provider Notes (Signed)
CSN: [id number]     Arrival date & time 12/05/13  1004 History   First MD Initiated Contact with Patient 12/05/13 1008     Chief Complaint  Patient presents with  . Insect Bite   (Consider location/radiation/quality/duration/timing/severity/associated sxs/prior Treatment) Patient is a 63 y.o. male presenting with rash. The history is provided by the patient.  Rash Location:  Shoulder/arm and leg Shoulder/arm rash location:  R forearm Leg rash location:  L upper leg Quality: painful and redness   Pain details:    Severity:  Mild Severity:  Mild Onset quality:  Gradual Duration:  1 week Progression:  Worsening Chronicity:  New Context: insect bite/sting   Context comment:  Redness and pustular lesions x2 on skin as noted. local cellulitis. Associated symptoms: induration     Past Medical History  Diagnosis Date  . Diabetes mellitus   . Hypertension   . High cholesterol    Past Surgical History  Procedure Laterality Date  . Total hip arthroplasty      bilat  . Knee surgery    . Colon resection    . Total shoulder arthroplasty      right   History reviewed. No pertinent family history. History  Substance Use Topics  . Smoking status: Current Some Day Smoker -- 0.03 packs/day    Types: Cigarettes  . Smokeless tobacco: Not on file  . Alcohol Use: No    Review of Systems  Skin: Positive for rash.    Allergies  Pravastatin and Simvastatin  Home Medications   Prior to Admission medications   Medication Sig Start Date End Date Taking? Authorizing Provider  amLODipine (NORVASC) 10 MG tablet Take 10 mg by mouth daily.   Yes Historical Provider, MD  atorvastatin (LIPITOR) 20 MG tablet Take 20 mg by mouth daily.   Yes Historical Provider, MD  furosemide (LASIX) 40 MG tablet Take 40 mg by mouth.   Yes Historical Provider, MD  glipiZIDE (GLUCOTROL) 10 MG tablet Take 10 mg by mouth 2 (two) times daily before a meal.   Yes Historical Provider, MD   HYDROcodone-acetaminophen (NORCO/VICODIN) 5-325 MG per tablet Take 1 tablet by mouth 2 (two) times daily.   Yes Historical Provider, MD  lisinopril (PRINIVIL,ZESTRIL) 40 MG tablet Take 40 mg by mouth daily.   Yes Historical Provider, MD  Omega-3 Fatty Acids (FISH OIL) 1000 MG CAPS Take 1,000 mg by mouth.   Yes Historical Provider, MD  sodium bicarbonate 650 MG tablet Take 1,300 mg by mouth 2 (two) times daily.   Yes Historical Provider, MD  bacitracin ointment Apply topically 2 (two) times daily. Apply bid affected area for 7 days 01/24/12   Rosana Hoes, MD  diphenhydrAMINE (BENADRYL) 25 MG tablet Take 1 tablet (25 mg total) by mouth every 8 (eight) hours as needed for itching. 05/18/11 06/17/11  Clayton Bibles, PA-C  glyBURIDE (DIABETA) 5 MG tablet Take 5 mg by mouth daily with breakfast.    Historical Provider, MD  mineral oil-hydrophilic petrolatum (AQUAPHOR) ointment Apply topically as needed for dry skin. Apply daily after showers 01/24/12   Rosana Hoes, MD  minocycline (MINOCIN,DYNACIN) 100 MG capsule Take 1 capsule (100 mg total) by mouth 2 (two) times daily. 12/05/13   Billy Fischer, MD  rosuvastatin (CRESTOR) 20 MG tablet Take 10 mg by mouth daily.    Historical Provider, MD  triamcinolone cream (KENALOG) 0.1 % Apply topically 2 (two) times daily. Apply bid affected  areas 01/24/12   Rosana Hoes, MD  BP 169/75  Pulse 86  Temp(Src) 98.2 F (36.8 C) (Oral)  Resp 16  Ht 5\' 11"  (1.803 m)  Wt 210 lb (95.255 kg)  BMI 29.30 kg/m2  SpO2 95% Physical Exam  Nursing note and vitals reviewed. Constitutional: He is oriented to person, place, and time. He appears well-developed and well-nourished.  Neck: Normal range of motion. Neck supple.  Neurological: He is alert and oriented to person, place, and time.  Skin: Skin is warm and dry. There is erythema.  2 pustular skin lesions on right arm and left upper thigh.    ED Course  Procedures (including critical care time) Labs Review Labs Reviewed -  No data to display  Imaging Review No results found.   MDM   1. Cellulitis due to MRSA        Billy Fischer, MD 12/05/13 1035

## 2013-12-21 DIAGNOSIS — R351 Nocturia: Secondary | ICD-10-CM | POA: Diagnosis not present

## 2013-12-21 DIAGNOSIS — R339 Retention of urine, unspecified: Secondary | ICD-10-CM | POA: Diagnosis not present

## 2013-12-21 DIAGNOSIS — R972 Elevated prostate specific antigen [PSA]: Secondary | ICD-10-CM | POA: Diagnosis not present

## 2015-02-26 ENCOUNTER — Other Ambulatory Visit (HOSPITAL_COMMUNITY): Payer: Self-pay

## 2015-02-26 ENCOUNTER — Other Ambulatory Visit: Payer: Self-pay

## 2015-03-08 ENCOUNTER — Inpatient Hospital Stay (HOSPITAL_COMMUNITY)
Admission: EM | Admit: 2015-03-08 | Discharge: 2015-03-14 | DRG: 673 | Disposition: A | Discharge status: Home / Self Care | Payer: Medicare Other | Attending: Internal Medicine | Admitting: Internal Medicine

## 2015-03-08 ENCOUNTER — Encounter (HOSPITAL_COMMUNITY): Payer: Self-pay | Admitting: Family Medicine

## 2015-03-08 ENCOUNTER — Emergency Department (HOSPITAL_COMMUNITY): Payer: Medicare Other

## 2015-03-08 DIAGNOSIS — N179 Acute kidney failure, unspecified: Secondary | ICD-10-CM | POA: Diagnosis not present

## 2015-03-08 DIAGNOSIS — E875 Hyperkalemia: Secondary | ICD-10-CM | POA: Diagnosis present

## 2015-03-08 DIAGNOSIS — J9601 Acute respiratory failure with hypoxia: Secondary | ICD-10-CM

## 2015-03-08 DIAGNOSIS — R350 Frequency of micturition: Secondary | ICD-10-CM | POA: Diagnosis not present

## 2015-03-08 DIAGNOSIS — N390 Urinary tract infection, site not specified: Secondary | ICD-10-CM | POA: Diagnosis not present

## 2015-03-08 DIAGNOSIS — I129 Hypertensive chronic kidney disease with stage 1 through stage 4 chronic kidney disease, or unspecified chronic kidney disease: Secondary | ICD-10-CM | POA: Diagnosis present

## 2015-03-08 DIAGNOSIS — R339 Retention of urine, unspecified: Secondary | ICD-10-CM | POA: Diagnosis present

## 2015-03-08 DIAGNOSIS — Z96643 Presence of artificial hip joint, bilateral: Secondary | ICD-10-CM | POA: Diagnosis present

## 2015-03-08 DIAGNOSIS — E538 Deficiency of other specified B group vitamins: Secondary | ICD-10-CM | POA: Diagnosis present

## 2015-03-08 DIAGNOSIS — Z888 Allergy status to other drugs, medicaments and biological substances status: Secondary | ICD-10-CM

## 2015-03-08 DIAGNOSIS — N189 Chronic kidney disease, unspecified: Secondary | ICD-10-CM

## 2015-03-08 DIAGNOSIS — D649 Anemia, unspecified: Secondary | ICD-10-CM | POA: Diagnosis present

## 2015-03-08 DIAGNOSIS — Z6830 Body mass index (BMI) 30.0-30.9, adult: Secondary | ICD-10-CM

## 2015-03-08 DIAGNOSIS — N183 Chronic kidney disease, stage 3 unspecified: Secondary | ICD-10-CM | POA: Diagnosis present

## 2015-03-08 DIAGNOSIS — J441 Chronic obstructive pulmonary disease with (acute) exacerbation: Secondary | ICD-10-CM | POA: Diagnosis not present

## 2015-03-08 DIAGNOSIS — J438 Other emphysema: Secondary | ICD-10-CM | POA: Diagnosis not present

## 2015-03-08 DIAGNOSIS — C189 Malignant neoplasm of colon, unspecified: Secondary | ICD-10-CM | POA: Diagnosis not present

## 2015-03-08 DIAGNOSIS — N185 Chronic kidney disease, stage 5: Secondary | ICD-10-CM

## 2015-03-08 DIAGNOSIS — E1165 Type 2 diabetes mellitus with hyperglycemia: Secondary | ICD-10-CM | POA: Diagnosis present

## 2015-03-08 DIAGNOSIS — Z923 Personal history of irradiation: Secondary | ICD-10-CM

## 2015-03-08 DIAGNOSIS — N289 Disorder of kidney and ureter, unspecified: Secondary | ICD-10-CM

## 2015-03-08 DIAGNOSIS — I3139 Other pericardial effusion (noninflammatory): Secondary | ICD-10-CM | POA: Diagnosis present

## 2015-03-08 DIAGNOSIS — Z8249 Family history of ischemic heart disease and other diseases of the circulatory system: Secondary | ICD-10-CM

## 2015-03-08 DIAGNOSIS — D72829 Elevated white blood cell count, unspecified: Secondary | ICD-10-CM | POA: Diagnosis present

## 2015-03-08 DIAGNOSIS — E669 Obesity, unspecified: Secondary | ICD-10-CM | POA: Diagnosis present

## 2015-03-08 DIAGNOSIS — N186 End stage renal disease: Secondary | ICD-10-CM

## 2015-03-08 DIAGNOSIS — F17210 Nicotine dependence, cigarettes, uncomplicated: Secondary | ICD-10-CM | POA: Diagnosis present

## 2015-03-08 DIAGNOSIS — E8889 Other specified metabolic disorders: Secondary | ICD-10-CM | POA: Diagnosis not present

## 2015-03-08 DIAGNOSIS — I10 Essential (primary) hypertension: Secondary | ICD-10-CM | POA: Diagnosis present

## 2015-03-08 DIAGNOSIS — E872 Acidosis: Secondary | ICD-10-CM | POA: Diagnosis present

## 2015-03-08 DIAGNOSIS — J449 Chronic obstructive pulmonary disease, unspecified: Secondary | ICD-10-CM | POA: Diagnosis present

## 2015-03-08 DIAGNOSIS — N184 Chronic kidney disease, stage 4 (severe): Secondary | ICD-10-CM | POA: Diagnosis present

## 2015-03-08 DIAGNOSIS — E1122 Type 2 diabetes mellitus with diabetic chronic kidney disease: Secondary | ICD-10-CM | POA: Diagnosis not present

## 2015-03-08 DIAGNOSIS — T380X5A Adverse effect of glucocorticoids and synthetic analogues, initial encounter: Secondary | ICD-10-CM | POA: Diagnosis present

## 2015-03-08 DIAGNOSIS — I313 Pericardial effusion (noninflammatory): Secondary | ICD-10-CM | POA: Diagnosis present

## 2015-03-08 DIAGNOSIS — D631 Anemia in chronic kidney disease: Secondary | ICD-10-CM | POA: Diagnosis present

## 2015-03-08 DIAGNOSIS — Z96611 Presence of right artificial shoulder joint: Secondary | ICD-10-CM | POA: Diagnosis present

## 2015-03-08 DIAGNOSIS — Z66 Do not resuscitate: Secondary | ICD-10-CM | POA: Diagnosis present

## 2015-03-08 DIAGNOSIS — R338 Other retention of urine: Secondary | ICD-10-CM | POA: Diagnosis present

## 2015-03-08 DIAGNOSIS — E7800 Pure hypercholesterolemia, unspecified: Secondary | ICD-10-CM | POA: Diagnosis present

## 2015-03-08 DIAGNOSIS — N401 Enlarged prostate with lower urinary tract symptoms: Secondary | ICD-10-CM | POA: Diagnosis present

## 2015-03-08 DIAGNOSIS — Z7984 Long term (current) use of oral hypoglycemic drugs: Secondary | ICD-10-CM

## 2015-03-08 DIAGNOSIS — R0602 Shortness of breath: Secondary | ICD-10-CM | POA: Diagnosis not present

## 2015-03-08 DIAGNOSIS — Z86018 Personal history of other benign neoplasm: Secondary | ICD-10-CM

## 2015-03-08 DIAGNOSIS — Z8546 Personal history of malignant neoplasm of prostate: Secondary | ICD-10-CM

## 2015-03-08 DIAGNOSIS — N320 Bladder-neck obstruction: Secondary | ICD-10-CM | POA: Diagnosis present

## 2015-03-08 DIAGNOSIS — Z9049 Acquired absence of other specified parts of digestive tract: Secondary | ICD-10-CM

## 2015-03-08 HISTORY — DX: Malignant (primary) neoplasm, unspecified: C80.1

## 2015-03-08 HISTORY — DX: Chronic obstructive pulmonary disease, unspecified: J44.9

## 2015-03-08 HISTORY — DX: Chronic kidney disease, unspecified: N18.9

## 2015-03-08 LAB — CBC WITH DIFFERENTIAL/PLATELET
BASOS ABS: 0 10*3/uL (ref 0.0–0.1)
BASOS PCT: 0 %
Eosinophils Absolute: 0.3 10*3/uL (ref 0.0–0.7)
Eosinophils Relative: 3 %
HEMATOCRIT: 29.6 % — AB (ref 39.0–52.0)
HEMOGLOBIN: 9.2 g/dL — AB (ref 13.0–17.0)
LYMPHS PCT: 17 %
Lymphs Abs: 1.3 10*3/uL (ref 0.7–4.0)
MCH: 31.2 pg (ref 26.0–34.0)
MCHC: 31.1 g/dL (ref 30.0–36.0)
MCV: 100.3 fL — AB (ref 78.0–100.0)
MONO ABS: 0.6 10*3/uL (ref 0.1–1.0)
Monocytes Relative: 8 %
NEUTROS ABS: 5.8 10*3/uL (ref 1.7–7.7)
NEUTROS PCT: 72 %
Platelets: 304 10*3/uL (ref 150–400)
RBC: 2.95 MIL/uL — AB (ref 4.22–5.81)
RDW: 14.7 % (ref 11.5–15.5)
WBC: 8.1 10*3/uL (ref 4.0–10.5)

## 2015-03-08 LAB — URINALYSIS, ROUTINE W REFLEX MICROSCOPIC
Bilirubin Urine: NEGATIVE
Glucose, UA: NEGATIVE mg/dL
HGB URINE DIPSTICK: NEGATIVE
KETONES UR: NEGATIVE mg/dL
LEUKOCYTES UA: NEGATIVE
NITRITE: POSITIVE — AB
Specific Gravity, Urine: 1.012 (ref 1.005–1.030)
pH: 7 (ref 5.0–8.0)

## 2015-03-08 LAB — BASIC METABOLIC PANEL
ANION GAP: 9 (ref 5–15)
BUN: 40 mg/dL — ABNORMAL HIGH (ref 6–20)
CO2: 25 mmol/L (ref 22–32)
Calcium: 8.6 mg/dL — ABNORMAL LOW (ref 8.9–10.3)
Chloride: 106 mmol/L (ref 101–111)
Creatinine, Ser: 5.4 mg/dL — ABNORMAL HIGH (ref 0.61–1.24)
GFR, EST AFRICAN AMERICAN: 12 mL/min — AB (ref >60)
GFR, EST NON AFRICAN AMERICAN: 10 mL/min — AB (ref >60)
GLUCOSE: 81 mg/dL (ref 65–99)
POTASSIUM: 4.9 mmol/L (ref 3.5–5.1)
Sodium: 140 mmol/L (ref 135–145)

## 2015-03-08 LAB — URINE MICROSCOPIC-ADD ON: RBC / HPF: NONE SEEN RBC/hpf (ref 0–5)

## 2015-03-08 LAB — I-STAT CG4 LACTIC ACID, ED
Lactic Acid, Venous: 0.87 mmol/L (ref 0.5–2.0)
Lactic Acid, Venous: 0.82 mmol/L (ref 0.5–2.0)

## 2015-03-08 LAB — I-STAT TROPONIN, ED: TROPONIN I, POC: 0.02 ng/mL (ref 0.00–0.08)

## 2015-03-08 MED ORDER — IPRATROPIUM-ALBUTEROL 0.5-2.5 (3) MG/3ML IN SOLN
3.0000 mL | Freq: Once | RESPIRATORY_TRACT | Status: AC
  Administered 2015-03-08: 3 mL via RESPIRATORY_TRACT
  Filled 2015-03-08: qty 3

## 2015-03-08 MED ORDER — SODIUM CHLORIDE 0.9 % IV BOLUS (SEPSIS)
1000.0000 mL | Freq: Once | INTRAVENOUS | Status: AC
  Administered 2015-03-08: 1000 mL via INTRAVENOUS

## 2015-03-08 MED ORDER — DEXTROSE 5 % IV SOLN
1.0000 g | Freq: Once | INTRAVENOUS | Status: AC
  Administered 2015-03-08: 1 g via INTRAVENOUS
  Filled 2015-03-08: qty 10

## 2015-03-08 MED ORDER — IOHEXOL 300 MG/ML  SOLN: 25.0000 mL | Freq: Once | INTRAMUSCULAR | Status: DC | PRN

## 2015-03-08 NOTE — ED Provider Notes (Signed)
CSN: [id number]     Arrival date & time 03/08/15  1622 History   First MD Initiated Contact with Patient 03/08/15 1945     Chief Complaint  Patient presents with  . Urinary Frequency     (Consider location/radiation/quality/duration/timing/severity/associated sxs/prior Treatment) The history is provided by the patient.  [name] is a 65 y.o. male hx of DM, HTN, HL, prostate and colon cancer here with abdominal bloating, dysuria, urinary incontinence. Patient states that for the last 3-4 days, he has been having some dysuria and urinary incontinence. His doctor called in [place] for him that did not relieve his symptoms. He also noticed progressive bloating in his abdomen but is still passing gas and had a normal bowel movement today. Denies any fever or vomiting. Patient had radiation for prostate cancer in the last radiation was 3 weeks ago. Currently is not on chemotherapy. He had previous inguinal hernia repair as well as colon cancer resection. He has some cough as well and subjective shortness of breath. Has subjective chills but no fever at home.    Past Medical History  Diagnosis Date  . Diabetes mellitus   . Hypertension   . High cholesterol    Past Surgical History  Procedure Laterality Date  . Total hip arthroplasty      bilat  . Knee surgery    . Colon resection    . Total shoulder arthroplasty      right   History reviewed. No pertinent family history. Social History  Substance Use Topics  . Smoking status: Current Some Day Smoker -- 0.03 packs/day    Types: Cigarettes  . Smokeless tobacco: None  . Alcohol Use: No    Review of Systems  Genitourinary: Positive for frequency.  All other systems reviewed and are negative.     Allergies  Pravastatin and Simvastatin  Home Medications   Prior to Admission medications   Medication Sig Start Date End Date Taking? Authorizing Provider  amLODipine (NORVASC) 10 MG tablet Take 10 mg by mouth daily.   Yes  Historical Provider, MD  atorvastatin (LIPITOR) 20 MG tablet Take 10 mg by mouth daily.   Yes Historical Provider, MD  calcium acetate (PHOSLO) 667 MG capsule Take 1,334 mg by mouth 3 (three) times daily with meals.   Yes Historical Provider, MD  Cholecalciferol (VITAMIN D-3) 1000 units CAPS Take 1 capsule by mouth 2 (two) times daily.   Yes Historical Provider, MD  furosemide (LASIX) 20 MG tablet Take 10 mg by mouth daily.   Yes Historical Provider, MD  glipiZIDE (GLUCOTROL) 10 MG tablet Take 20 mg by mouth 2 (two) times daily before a meal.    Yes Historical Provider, MD  lisinopril (PRINIVIL,ZESTRIL) 40 MG tablet Take 20 mg by mouth daily.   Yes Historical Provider, MD  phenazopyridine (PYRIDIUM) 100 MG tablet Take 100 mg by mouth 3 (three) times daily.   Yes Historical Provider, MD  sodium bicarbonate 650 MG tablet Take 1,300 mg by mouth 2 (two) times daily.   Yes Historical Provider, MD  tamsulosin (FLOMAX) 0.4 MG CAPS capsule Take 0.4 mg by mouth daily.   Yes Historical Provider, MD  bacitracin ointment Apply topically 2 (two) times daily. Apply bid affected area for 7 days Patient not taking: Reported on 03/08/2015 01/24/12   Rosana Hoes, MD  diphenhydrAMINE (BENADRYL) 25 MG tablet Take 1 tablet (25 mg total) by mouth every 8 (eight) hours as needed for itching. 05/18/11 06/17/11  Clayton Bibles, PA-C  mineral  oil-hydrophilic petrolatum (AQUAPHOR) ointment Apply topically as needed for dry skin. Apply daily after showers Patient not taking: Reported on 03/08/2015 01/24/12   Rosana Hoes, MD  minocycline (MINOCIN,DYNACIN) 100 MG capsule Take 1 capsule (100 mg total) by mouth 2 (two) times daily. Patient not taking: Reported on 03/08/2015 12/05/13   Billy Fischer, MD  triamcinolone cream (KENALOG) 0.1 % Apply topically 2 (two) times daily. Apply bid affected  areas Patient not taking: Reported on 03/08/2015 01/24/12   Rosana Hoes, MD   BP 139/76 mmHg  Pulse 91  Temp(Src) 98.6 F (37 C) (Oral)  Resp 17   SpO2 93% Physical Exam  Constitutional: He is oriented to person, place, and time.  Chronically ill, uncomfortable   HENT:  Head: Normocephalic.  MM dry   Eyes: Conjunctivae are normal. Pupils are equal, round, and reactive to light.  Neck: Normal range of motion.  Cardiovascular: Normal rate, regular rhythm and normal heart sounds.   Pulmonary/Chest: Effort normal.  Mild diffuse wheezing, no retractions   Abdominal:  Distended, mild diffuse tenderness, no rebound. No CVAT   Musculoskeletal: Normal range of motion. He exhibits no edema or tenderness.  Neurological: He is alert and oriented to person, place, and time.  Skin: Skin is warm and dry.  Psychiatric: He has a normal mood and affect. His behavior is normal. Thought content normal.  Nursing note and vitals reviewed.   ED Course  Procedures (including critical care time) Labs Review Labs Reviewed  URINALYSIS, ROUTINE W REFLEX MICROSCOPIC (NOT AT [facility]) - Abnormal; Notable for the following:    Protein, ur >300 (*)    Nitrite POSITIVE (*)    All other components within normal limits  URINE MICROSCOPIC-ADD ON - Abnormal; Notable for the following:    Squamous Epithelial / LPF 0-5 (*)    Bacteria, UA RARE (*)    All other components within normal limits  BASIC METABOLIC PANEL - Abnormal; Notable for the following:    BUN 40 (*)    Creatinine, Ser 5.40 (*)    Calcium 8.6 (*)    GFR calc non Af Amer 10 (*)    GFR calc Af Amer 12 (*)    All other components within normal limits  CBC WITH DIFFERENTIAL/PLATELET - Abnormal; Notable for the following:    RBC 2.95 (*)    Hemoglobin 9.2 (*)    HCT 29.6 (*)    MCV 100.3 (*)    All other components within normal limits  HEPATIC FUNCTION PANEL  LIPASE, BLOOD  I-STAT CG4 LACTIC ACID, ED  I-STAT TROPOININ, ED  I-STAT CG4 LACTIC ACID, ED    Imaging Review Ct Abdomen Pelvis Wo Contrast  03/08/2015  CLINICAL DATA:  Abdominal pain and bloating. Bladder leakage. Undergoing  radiation therapy for colon carcinoma. EXAM: CT ABDOMEN AND PELVIS WITHOUT CONTRAST TECHNIQUE: Multidetector CT imaging of the abdomen and pelvis was performed following the standard protocol without IV contrast. COMPARISON:  None. FINDINGS: Lower chest:  Normal heart size.  Small pericardial effusion noted. Hepatobiliary: No mass visualized on this un-enhanced exam. Gallbladder is unremarkable. Will Pancreas: No mass or inflammatory process identified on this un-enhanced exam. Spleen: Within normal limits in size. Adrenals/Urinary Tract: A 3 cm lesion is seen in the lower pole of the right kidney and a 2 cm lesion is seen in the upper pole the left kidney which have higher than fluid attenuation. These could represent a hemorrhagic cysts or solid renal masses. Other low-attenuation lesions cannot be characterized  but likely represent small renal cysts. No evidence of ureteral calculi or hydronephrosis. The urinary bladder is markedly distended, but otherwise unremarkable in appearance on this noncontrast study. Stomach/Bowel: No evidence of obstruction, inflammatory process, or abnormal fluid collections. Surgical clips seen from previous partial right colectomy. Surgical mesh seen in anterior abdominal wall, without evidence of recurrent hernia. Vascular/Lymphatic: No pathologically enlarged lymph nodes. No evidence of abdominal aortic aneurysm. Reproductive: No mass or other significant abnormality. Other: Bilateral hip prostheses result in significant beam hardening artifact through the inferior pelvis. Musculoskeletal:  No suspicious bone lesions identified. IMPRESSION: No evidence of urolithiasis or hydronephrosis. Markedly distended urinary bladder ; recommend clinical correlation for urinary retention. Indeterminate lesions in lower pole of right kidney in upper pole of left kidney, which could represent hemorrhagic cysts or solid renal masses. Nonemergent outpatient abdomen MRI without and with contrast  recommended for further characterization. Small pericardial effusion incidentally noted. Electronically Signed   By: Earle Gell M.D.   On: 03/08/2015 22:04   Dg Chest 2 View  03/08/2015  CLINICAL DATA:  Shortness of breath and congestion for 3 days. History of hypertension, diabetes, smoker. EXAM: CHEST  2 VIEW COMPARISON:  None. FINDINGS: Mild hyperinflation. Normal heart size and pulmonary vascularity. No focal airspace disease or consolidation in the lungs. No blunting of costophrenic angles. No pneumothorax. Degenerative changes in the spine. Mild thoracic scoliosis convex towards the right. IMPRESSION: No active cardiopulmonary disease. Electronically Signed   By: Lucienne Capers M.D.   On: 03/08/2015 22:47   I have personally reviewed and evaluated these images and lab results as part of my medical decision-making.   EKG Interpretation   Date/Time:  Friday March 08 2015 20:04:07 EST Ventricular Rate:  97 PR Interval:  176 QRS Duration: 67 QT Interval:  340 QTC Calculation: 432 R Axis:   -19 Text Interpretation:  Sinus rhythm Ventricular premature complex Low  voltage, extremity leads No significant change since last tracing  Confirmed by Iasha Mccalister  MD, Abiageal Blowe ([id number]) on 03/08/2015 9:47:39 PM      MDM   Final diagnoses:  None   [name] is a 65 y.o. male here with abdominal distention, cough. Consider SBO vs pyelo vs UTI vs bronchitis. Will get labs, CXR, UA, CT ab/pel.   10:51 PM Patient has acute renal failure. Cr 5.4, baseline around 2. UA + UTI. CT showed urinary retention. Foley placed. Has incidental small pericardial effusion on CT but CXR showed no obvious pulmonary edema or enlarged pericardium. Will admit to tele for monitoring for acute renal failure, UTI, retention.      Wandra Arthurs, MD 03/08/15 [phone]

## 2015-03-08 NOTE — H&P (Signed)
PCP:  [facility] [place] Dr. Gilmer Mor with Rad/onc  Referring provider Darl Householder   Chief Complaint: Abdominal pain and distention  HPI: [name] is a 65 y.o. male   has a past medical history of Diabetes mellitus; Hypertension; High cholesterol; Cancer ([place]); Chronic kidney disease; COPD (chronic obstructive pulmonary disease) ([place]); and Shortness of breath dyspnea.   Presented with abdominal pain and distention as well as some urinary incontinence. He has known history of colon cancer status post radiation therapy as well as history of prostate followed by urology at [facility] he has been prescribed in the past Flomax.  He has been having increased frequency of urination and straining. Reports some dysuria as well. He called his PCP and was called in P[place]  but this did not help. Patient had abdominal bloating but able to pass gas and have normal BMs.  Patient did endorse mild coughing non productive and wheezing slight dyspnea worse for the past 1 week. He continues to smoke he denies any fever or chills. IN ER: Creatinine was noted up to 5.4 from baseline of 2 he was noted to be anemic with hemoglobin down to 9.2 active calcium within normal limits 0.87 CT of abdomen and pelvis showed distended urinary bladder worrisome for urinary transient but no hydronephrosis was noted. Lesions in the lower  pole of the left kidney could not rule out solid masses recommend follow-up MRI. CT scan results also significant for small pericardial effusion which was not confirmed by chest x-ray.    Regarding pertinent past history: Patient is status post partial colectomy for benign tumor in April 2003 and his known history of benign prostatic hypertrophy and prostate cancer sp radiation therapy 3 weeks ago followed by [facility]. He has history of diabetes on by mouth medications. Patient apparently has history of chronic kidney disease stage III had elevated creatinine in the past  dating back to 2009 at baseline his creatinine is 2.0.   Hospitalist was called for admission for urinary retention resulting in acute on chronic renal failure  Review of Systems:    Pertinent positives include:  chills, wheezing. abdominal pain, dyspnea on exertion, dysuria, straining with urination  Constitutional:  No weight loss, night sweats, Fevers, fatigue, weight loss  HEENT:  No headaches, Difficulty swallowing,Tooth/dental problems,Sore throat,  No sneezing, itching, ear ache, nasal congestion, post nasal drip,  Cardio-vascular:  No chest pain, Orthopnea, PND, anasarca, dizziness, palpitations.no Bilateral lower extremity swelling  GI:  No heartburn, indigestion, nausea, vomiting, diarrhea, change in bowel habits, loss of appetite, melena, blood in stool, hematemesis Resp:  no shortness of breath at rest. No  No excess mucus, no productive cough, No non-productive cough, No coughing up of blood. No change in color of mucus.No Skin:  no rash or lesions. No jaundice GU:  no  change in color of urine, no urgency or frequency. No straining to urinate.  No flank pain.  Musculoskeletal:  No joint pain or no joint swelling. No decreased range of motion. No back pain.  Psych:  No change in mood or affect. No depression or anxiety. No memory loss.  Neuro: no localizing neurological complaints, no tingling, no weakness, no double vision, no gait abnormality, no slurred speech, no confusion  Otherwise ROS are negative except for above, 10 systems were reviewed  Past Medical History: Past Medical History  Diagnosis Date  . Diabetes mellitus   . Hypertension   . High cholesterol   .  Cancer ([place])   . Chronic kidney disease   . COPD (chronic obstructive pulmonary disease) ([place])   . Shortness of breath dyspnea    Past Surgical History  Procedure Laterality Date  . Total hip arthroplasty      bilat  . Knee surgery    . Colon resection    . Total shoulder arthroplasty       right     Medications: Prior to Admission medications   Medication Sig Start Date End Date Taking? Authorizing Provider  amLODipine (NORVASC) 10 MG tablet Take 10 mg by mouth daily.   Yes Historical Provider, MD  atorvastatin (LIPITOR) 20 MG tablet Take 10 mg by mouth daily.   Yes Historical Provider, MD  calcium acetate (PHOSLO) 667 MG capsule Take 1,334 mg by mouth 3 (three) times daily with meals.   Yes Historical Provider, MD  Cholecalciferol (VITAMIN D-3) 1000 units CAPS Take 1 capsule by mouth 2 (two) times daily.   Yes Historical Provider, MD  furosemide (LASIX) 20 MG tablet Take 10 mg by mouth daily.   Yes Historical Provider, MD  glipiZIDE (GLUCOTROL) 10 MG tablet Take 20 mg by mouth 2 (two) times daily before a meal.    Yes Historical Provider, MD  lisinopril (PRINIVIL,ZESTRIL) 40 MG tablet Take 20 mg by mouth daily.   Yes Historical Provider, MD  phenazopyridine (PYRIDIUM) 100 MG tablet Take 100 mg by mouth 3 (three) times daily.   Yes Historical Provider, MD  sodium bicarbonate 650 MG tablet Take 1,300 mg by mouth 2 (two) times daily.   Yes Historical Provider, MD  tamsulosin (FLOMAX) 0.4 MG CAPS capsule Take 0.4 mg by mouth daily.   Yes Historical Provider, MD  bacitracin ointment Apply topically 2 (two) times daily. Apply bid affected area for 7 days Patient not taking: Reported on 03/08/2015 01/24/12   Rosana Hoes, MD  diphenhydrAMINE (BENADRYL) 25 MG tablet Take 1 tablet (25 mg total) by mouth every 8 (eight) hours as needed for itching. 05/18/11 06/17/11  Clayton Bibles, PA-C  mineral oil-hydrophilic petrolatum (AQUAPHOR) ointment Apply topically as needed for dry skin. Apply daily after showers Patient not taking: Reported on 03/08/2015 01/24/12   Rosana Hoes, MD  minocycline (MINOCIN,DYNACIN) 100 MG capsule Take 1 capsule (100 mg total) by mouth 2 (two) times daily. Patient not taking: Reported on 03/08/2015 12/05/13   Billy Fischer, MD  triamcinolone cream (KENALOG) 0.1 % Apply  topically 2 (two) times daily. Apply bid affected  areas Patient not taking: Reported on 03/08/2015 01/24/12   Rosana Hoes, MD    Allergies:   Allergies  Allergen Reactions  . Pravastatin     Muscle pain  . Simvastatin     Muscle pain    Social History:  Ambulatory  independentlyd Lives at home alone divorced    reports that he has been smoking Cigarettes.  He has been smoking about 0.03 packs per day. He does not have any smokeless tobacco history on file. He reports that he does not drink alcohol or use illicit drugs.    Family History: family history includes CAD in his father; Heart failure in his mother. There is no history of Prostate cancer or Kidney cancer.    Physical Exam: Patient Vitals for the past 24 hrs:  BP Temp Temp src Pulse Resp SpO2  03/08/15 2300 142/78 mmHg - - 89 16 94 %  03/08/15 2130 139/76 mmHg - - 91 17 93 %  03/08/15 2115 152/73 mmHg - - 109 (!)  29 93 %  03/08/15 2100 149/80 mmHg - - 93 21 93 %  03/08/15 2045 162/69 mmHg - - 94 20 94 %  03/08/15 2015 131/74 mmHg - - 88 18 94 %  03/08/15 2000 142/79 mmHg - - 92 23 95 %  03/08/15 1945 161/92 mmHg - - 95 24 93 %  03/08/15 1938 131/72 mmHg 98.6 F (37 C) Oral 115 18 94 %  03/08/15 1640 142/65 mmHg 99.2 F (37.3 C) Oral 87 18 100 %    1. General:  in No Acute distress 2. Psychological: Alert and Oriented 3. Head/ENT:  Dry Mucous Membranes                          Head Non traumatic, neck supple                          Normal Dentition 4. SKIN: decreased Skin turgor,  Skin clean Dry and intact no rash 5. Heart: Regular rate and rhythm no Murmur, Rub or gallop 6. Lungs: Some wheezes no crackles   7. Abdomen: Soft, non-tender, somewhat distended 8. Lower extremities: no clubbing, cyanosis, or edema 9. Neurologically Grossly intact, moving all 4 extremities equally 10. MSK: Normal range of motion  body mass index is unknown because there is no weight on file.   Labs on Admission:    Results for orders placed or performed during the hospital encounter of 03/08/15 (from the past 24 hour(s))  Urinalysis, Routine w reflex microscopic-may I&O cath if menses (not at [facility])     Status: Abnormal   Collection Time: 03/08/15  4:44 PM  Result Value Ref Range   Color, Urine YELLOW YELLOW   APPearance CLEAR CLEAR   Specific Gravity, Urine 1.012 1.005 - 1.030   pH 7.0 5.0 - 8.0   Glucose, UA NEGATIVE NEGATIVE mg/dL   Hgb urine dipstick NEGATIVE NEGATIVE   Bilirubin Urine NEGATIVE NEGATIVE   Ketones, ur NEGATIVE NEGATIVE mg/dL   Protein, ur >300 (A) NEGATIVE mg/dL   Nitrite POSITIVE (A) NEGATIVE   Leukocytes, UA NEGATIVE NEGATIVE  Urine microscopic-add on     Status: Abnormal   Collection Time: 03/08/15  4:44 PM  Result Value Ref Range   Squamous Epithelial / LPF 0-5 (A) NONE SEEN   WBC, UA 0-5 0 - 5 WBC/hpf   RBC / HPF NONE SEEN 0 - 5 RBC/hpf   Bacteria, UA RARE (A) NONE SEEN  I-stat troponin, ED     Status: None   Collection Time: 03/08/15  8:10 PM  Result Value Ref Range   Troponin i, poc 0.02 0.00 - 0.08 ng/mL   Comment 3          I-Stat CG4 Lactic Acid, ED     Status: None   Collection Time: 03/08/15  8:13 PM  Result Value Ref Range   Lactic Acid, Venous 0.87 0.5 - 2.0 mmol/L  Basic metabolic panel     Status: Abnormal   Collection Time: 03/08/15  8:22 PM  Result Value Ref Range   Sodium 140 135 - 145 mmol/L   Potassium 4.9 3.5 - 5.1 mmol/L   Chloride 106 101 - 111 mmol/L   CO2 25 22 - 32 mmol/L   Glucose, Bld 81 65 - 99 mg/dL   BUN 40 (H) 6 - 20 mg/dL   Creatinine, Ser 5.40 (H) 0.61 - 1.24 mg/dL   Calcium 8.6 (L) 8.9 -  10.3 mg/dL   GFR calc non Af Amer 10 (L) >60 mL/min   GFR calc Af Amer 12 (L) >60 mL/min   Anion gap 9 5 - 15  CBC with Differential     Status: Abnormal   Collection Time: 03/08/15  8:22 PM  Result Value Ref Range   WBC 8.1 4.0 - 10.5 K/uL   RBC 2.95 (L) 4.22 - 5.81 MIL/uL   Hemoglobin 9.2 (L) 13.0 - 17.0 g/dL   HCT 29.6 (L) 39.0 -  52.0 %   MCV 100.3 (H) 78.0 - 100.0 fL   MCH 31.2 26.0 - 34.0 pg   MCHC 31.1 30.0 - 36.0 g/dL   RDW 14.7 11.5 - 15.5 %   Platelets 304 150 - 400 K/uL   Neutrophils Relative % 72 %   Neutro Abs 5.8 1.7 - 7.7 K/uL   Lymphocytes Relative 17 %   Lymphs Abs 1.3 0.7 - 4.0 K/uL   Monocytes Relative 8 %   Monocytes Absolute 0.6 0.1 - 1.0 K/uL   Eosinophils Relative 3 %   Eosinophils Absolute 0.3 0.0 - 0.7 K/uL   Basophils Relative 0 %   Basophils Absolute 0.0 0.0 - 0.1 K/uL  I-Stat CG4 Lactic Acid, ED     Status: None   Collection Time: 03/08/15 11:15 PM  Result Value Ref Range   Lactic Acid, Venous 0.82 0.5 - 2.0 mmol/L    UA no evidence of UTI, WBC 0-5 positive nitrites and rare bacteria  Lab Results  Component Value Date   HGBA1C 7.1* 10/10/2009    CrCl cannot be calculated (Unknown ideal weight.).  BNP (last 3 results) No results for input(s): PROBNP in the last 8760 hours.  Other results:  I have pearsonaly reviewed this: ECG REPORT  Rate: 97  Rhythm: Normal sinus rhythm with low voltage ST&T Change:  No ischemic changes QTC 432  There were no vitals filed for this visit.   Cultures: No results found for: SDES, SPECREQUEST, CULT, REPTSTATUS   Radiological Exams on Admission: Ct Abdomen Pelvis Wo Contrast  03/08/2015  CLINICAL DATA:  Abdominal pain and bloating. Bladder leakage. Undergoing radiation therapy for colon carcinoma. EXAM: CT ABDOMEN AND PELVIS WITHOUT CONTRAST TECHNIQUE: Multidetector CT imaging of the abdomen and pelvis was performed following the standard protocol without IV contrast. COMPARISON:  None. FINDINGS: Lower chest:  Normal heart size.  Small pericardial effusion noted. Hepatobiliary: No mass visualized on this un-enhanced exam. Gallbladder is unremarkable. Will Pancreas: No mass or inflammatory process identified on this un-enhanced exam. Spleen: Within normal limits in size. Adrenals/Urinary Tract: A 3 cm lesion is seen in the lower pole of the  right kidney and a 2 cm lesion is seen in the upper pole the left kidney which have higher than fluid attenuation. These could represent a hemorrhagic cysts or solid renal masses. Other low-attenuation lesions cannot be characterized but likely represent small renal cysts. No evidence of ureteral calculi or hydronephrosis. The urinary bladder is markedly distended, but otherwise unremarkable in appearance on this noncontrast study. Stomach/Bowel: No evidence of obstruction, inflammatory process, or abnormal fluid collections. Surgical clips seen from previous partial right colectomy. Surgical mesh seen in anterior abdominal wall, without evidence of recurrent hernia. Vascular/Lymphatic: No pathologically enlarged lymph nodes. No evidence of abdominal aortic aneurysm. Reproductive: No mass or other significant abnormality. Other: Bilateral hip prostheses result in significant beam hardening artifact through the inferior pelvis. Musculoskeletal:  No suspicious bone lesions identified. IMPRESSION: No evidence of urolithiasis or hydronephrosis.  Markedly distended urinary bladder ; recommend clinical correlation for urinary retention. Indeterminate lesions in lower pole of right kidney in upper pole of left kidney, which could represent hemorrhagic cysts or solid renal masses. Nonemergent outpatient abdomen MRI without and with contrast recommended for further characterization. Small pericardial effusion incidentally noted. Electronically Signed   By: Earle Gell M.D.   On: 03/08/2015 22:04   Dg Chest 2 View  03/08/2015  CLINICAL DATA:  Shortness of breath and congestion for 3 days. History of hypertension, diabetes, smoker. EXAM: CHEST  2 VIEW COMPARISON:  None. FINDINGS: Mild hyperinflation. Normal heart size and pulmonary vascularity. No focal airspace disease or consolidation in the lungs. No blunting of costophrenic angles. No pneumothorax. Degenerative changes in the spine. Mild thoracic scoliosis convex towards  the right. IMPRESSION: No active cardiopulmonary disease. Electronically Signed   By: Lucienne Capers M.D.   On: 03/08/2015 22:47    Chart has been reviewed  Family not  at  Bedside   Assessment/Plan  65 year old gentleman with a history of BPH as well as prostate cancer sp radiation therapy, chronic kidney disease stage III hypertension and diabetes being admitted for acute on chronic renal failure in the setting of urinary retention and mild COPD exacerbation  Present on Admission:  . Acute on chronic renal failure (HCC) most likely secondary to urinary retention, place Foley catheter obtain urine electrolytes, follow creatinine he will need follow up with urology and given chronic disease also nephrology . Will hold ACE inhibitor. If no improvement despite resolution of obstruction we'll need consult nephrology at this point no indication for dialysis  . Anemia - obtain anemia panel Hemoccult stool  . Urinary retention - Place Foley will need follow-up with urology  . Pericardial effusion - CT scan has a propensity to overestimate pericardial effusion but given dyspnea and low voltage EKG Will obtain echogram  . COPD (chronic obstructive pulmonary disease) Exacerbation  (HCC) - albuterol when necessary as well as Atrovent and Mucinex scheduled, prednisone taper, doxycycline 100 PO BID recommend tobacco cessation, make sure has  . Tobacco abuse  -spoke about importance of quitting . Essential hypertension - hold his ACE inhibitor continue otherwise home medications Renal lesion - once renal function improves will need MRI per renal  protocol  Prophylaxis: hep [place]  CODE STATUS:  DNR/DNI as per patient    Disposition: To home once workup is complete and patient is stable  Other plan as per orders.  I have spent a total of 57 min on this admission    Kem Hensen 03/09/2015, 12:18 AM    [facility]  Pager [phone]   after 2 AM please page floor coverage PA If 7AM-7PM,  please contact the day team taking care of the patient  [email or web address]  Password [email or web address]

## 2015-03-08 NOTE — ED Notes (Signed)
Pt here for bladder leakage. sts recent radiation for colon cancer and it started after that. Sts abd bloating.

## 2015-03-09 DIAGNOSIS — R350 Frequency of micturition: Secondary | ICD-10-CM | POA: Diagnosis present

## 2015-03-09 DIAGNOSIS — Z9049 Acquired absence of other specified parts of digestive tract: Secondary | ICD-10-CM | POA: Diagnosis not present

## 2015-03-09 DIAGNOSIS — E1165 Type 2 diabetes mellitus with hyperglycemia: Secondary | ICD-10-CM | POA: Diagnosis present

## 2015-03-09 DIAGNOSIS — E669 Obesity, unspecified: Secondary | ICD-10-CM | POA: Diagnosis present

## 2015-03-09 DIAGNOSIS — Z96611 Presence of right artificial shoulder joint: Secondary | ICD-10-CM | POA: Diagnosis present

## 2015-03-09 DIAGNOSIS — N179 Acute kidney failure, unspecified: Secondary | ICD-10-CM | POA: Diagnosis not present

## 2015-03-09 DIAGNOSIS — R0600 Dyspnea, unspecified: Secondary | ICD-10-CM | POA: Diagnosis not present

## 2015-03-09 DIAGNOSIS — N320 Bladder-neck obstruction: Secondary | ICD-10-CM | POA: Diagnosis present

## 2015-03-09 DIAGNOSIS — E538 Deficiency of other specified B group vitamins: Secondary | ICD-10-CM | POA: Diagnosis present

## 2015-03-09 DIAGNOSIS — N289 Disorder of kidney and ureter, unspecified: Secondary | ICD-10-CM | POA: Diagnosis present

## 2015-03-09 DIAGNOSIS — R338 Other retention of urine: Secondary | ICD-10-CM | POA: Diagnosis present

## 2015-03-09 DIAGNOSIS — E1122 Type 2 diabetes mellitus with diabetic chronic kidney disease: Secondary | ICD-10-CM | POA: Diagnosis not present

## 2015-03-09 DIAGNOSIS — Z86018 Personal history of other benign neoplasm: Secondary | ICD-10-CM | POA: Diagnosis not present

## 2015-03-09 DIAGNOSIS — E875 Hyperkalemia: Secondary | ICD-10-CM | POA: Diagnosis present

## 2015-03-09 DIAGNOSIS — Z8249 Family history of ischemic heart disease and other diseases of the circulatory system: Secondary | ICD-10-CM | POA: Diagnosis not present

## 2015-03-09 DIAGNOSIS — Z96643 Presence of artificial hip joint, bilateral: Secondary | ICD-10-CM | POA: Diagnosis present

## 2015-03-09 DIAGNOSIS — I120 Hypertensive chronic kidney disease with stage 5 chronic kidney disease or end stage renal disease: Secondary | ICD-10-CM | POA: Diagnosis not present

## 2015-03-09 DIAGNOSIS — E7800 Pure hypercholesterolemia, unspecified: Secondary | ICD-10-CM | POA: Diagnosis present

## 2015-03-09 DIAGNOSIS — N189 Chronic kidney disease, unspecified: Secondary | ICD-10-CM | POA: Diagnosis not present

## 2015-03-09 DIAGNOSIS — N390 Urinary tract infection, site not specified: Secondary | ICD-10-CM | POA: Diagnosis present

## 2015-03-09 DIAGNOSIS — D649 Anemia, unspecified: Secondary | ICD-10-CM | POA: Diagnosis not present

## 2015-03-09 DIAGNOSIS — F17210 Nicotine dependence, cigarettes, uncomplicated: Secondary | ICD-10-CM | POA: Diagnosis present

## 2015-03-09 DIAGNOSIS — E119 Type 2 diabetes mellitus without complications: Secondary | ICD-10-CM | POA: Diagnosis not present

## 2015-03-09 DIAGNOSIS — J441 Chronic obstructive pulmonary disease with (acute) exacerbation: Secondary | ICD-10-CM | POA: Diagnosis present

## 2015-03-09 DIAGNOSIS — Z6830 Body mass index (BMI) 30.0-30.9, adult: Secondary | ICD-10-CM | POA: Diagnosis not present

## 2015-03-09 DIAGNOSIS — Z7984 Long term (current) use of oral hypoglycemic drugs: Secondary | ICD-10-CM | POA: Diagnosis not present

## 2015-03-09 DIAGNOSIS — I10 Essential (primary) hypertension: Secondary | ICD-10-CM | POA: Diagnosis not present

## 2015-03-09 DIAGNOSIS — D631 Anemia in chronic kidney disease: Secondary | ICD-10-CM | POA: Diagnosis present

## 2015-03-09 DIAGNOSIS — Z888 Allergy status to other drugs, medicaments and biological substances status: Secondary | ICD-10-CM | POA: Diagnosis not present

## 2015-03-09 DIAGNOSIS — N401 Enlarged prostate with lower urinary tract symptoms: Secondary | ICD-10-CM | POA: Diagnosis present

## 2015-03-09 DIAGNOSIS — I319 Disease of pericardium, unspecified: Secondary | ICD-10-CM | POA: Diagnosis not present

## 2015-03-09 DIAGNOSIS — R339 Retention of urine, unspecified: Secondary | ICD-10-CM | POA: Diagnosis not present

## 2015-03-09 DIAGNOSIS — Z720 Tobacco use: Secondary | ICD-10-CM | POA: Diagnosis not present

## 2015-03-09 DIAGNOSIS — E1129 Type 2 diabetes mellitus with other diabetic kidney complication: Secondary | ICD-10-CM | POA: Diagnosis not present

## 2015-03-09 DIAGNOSIS — Z923 Personal history of irradiation: Secondary | ICD-10-CM | POA: Diagnosis not present

## 2015-03-09 DIAGNOSIS — T380X5A Adverse effect of glucocorticoids and synthetic analogues, initial encounter: Secondary | ICD-10-CM | POA: Diagnosis present

## 2015-03-09 DIAGNOSIS — N185 Chronic kidney disease, stage 5: Secondary | ICD-10-CM | POA: Diagnosis not present

## 2015-03-09 DIAGNOSIS — I129 Hypertensive chronic kidney disease with stage 1 through stage 4 chronic kidney disease, or unspecified chronic kidney disease: Secondary | ICD-10-CM | POA: Diagnosis not present

## 2015-03-09 DIAGNOSIS — J9601 Acute respiratory failure with hypoxia: Secondary | ICD-10-CM | POA: Diagnosis not present

## 2015-03-09 DIAGNOSIS — E872 Acidosis: Secondary | ICD-10-CM | POA: Diagnosis present

## 2015-03-09 DIAGNOSIS — Z8546 Personal history of malignant neoplasm of prostate: Secondary | ICD-10-CM | POA: Diagnosis not present

## 2015-03-09 DIAGNOSIS — E8889 Other specified metabolic disorders: Secondary | ICD-10-CM | POA: Diagnosis present

## 2015-03-09 DIAGNOSIS — D72829 Elevated white blood cell count, unspecified: Secondary | ICD-10-CM | POA: Diagnosis present

## 2015-03-09 DIAGNOSIS — J449 Chronic obstructive pulmonary disease, unspecified: Secondary | ICD-10-CM | POA: Diagnosis not present

## 2015-03-09 DIAGNOSIS — N183 Chronic kidney disease, stage 3 (moderate): Secondary | ICD-10-CM | POA: Diagnosis not present

## 2015-03-09 DIAGNOSIS — N184 Chronic kidney disease, stage 4 (severe): Secondary | ICD-10-CM | POA: Diagnosis not present

## 2015-03-09 DIAGNOSIS — Z66 Do not resuscitate: Secondary | ICD-10-CM | POA: Diagnosis present

## 2015-03-09 LAB — IRON AND TIBC
IRON: 55 ug/dL (ref 45–182)
Saturation Ratios: 23 % (ref 17.9–39.5)
TIBC: 244 ug/dL — ABNORMAL LOW (ref 250–450)
UIBC: 189 ug/dL

## 2015-03-09 LAB — TSH: TSH: 1.419 u[IU]/mL (ref 0.350–4.500)

## 2015-03-09 LAB — COMPREHENSIVE METABOLIC PANEL
ALBUMIN: 2.7 g/dL — AB (ref 3.5–5.0)
ALT: 13 U/L — ABNORMAL LOW (ref 17–63)
ANION GAP: 11 (ref 5–15)
AST: 17 U/L (ref 15–41)
Alkaline Phosphatase: 50 U/L (ref 38–126)
BUN: 37 mg/dL — ABNORMAL HIGH (ref 6–20)
CO2: 23 mmol/L (ref 22–32)
Calcium: 8.7 mg/dL — ABNORMAL LOW (ref 8.9–10.3)
Chloride: 108 mmol/L (ref 101–111)
Creatinine, Ser: 5.07 mg/dL — ABNORMAL HIGH (ref 0.61–1.24)
GFR calc non Af Amer: 11 mL/min — ABNORMAL LOW (ref >60)
GFR, EST AFRICAN AMERICAN: 12 mL/min — AB (ref >60)
GLUCOSE: 122 mg/dL — AB (ref 65–99)
POTASSIUM: 4.7 mmol/L (ref 3.5–5.1)
SODIUM: 142 mmol/L (ref 135–145)
TOTAL PROTEIN: 6 g/dL — AB (ref 6.5–8.1)
Total Bilirubin: 0.4 mg/dL (ref 0.3–1.2)

## 2015-03-09 LAB — CBC
HEMATOCRIT: 27.6 % — AB (ref 39.0–52.0)
HEMOGLOBIN: 8.5 g/dL — AB (ref 13.0–17.0)
MCH: 30.8 pg (ref 26.0–34.0)
MCHC: 30.8 g/dL (ref 30.0–36.0)
MCV: 100 fL (ref 78.0–100.0)
Platelets: 313 10*3/uL (ref 150–400)
RBC: 2.76 MIL/uL — AB (ref 4.22–5.81)
RDW: 14.8 % (ref 11.5–15.5)
WBC: 8.2 10*3/uL (ref 4.0–10.5)

## 2015-03-09 LAB — FERRITIN: FERRITIN: 348 ng/mL — AB (ref 24–336)

## 2015-03-09 LAB — GLUCOSE, CAPILLARY
GLUCOSE-CAPILLARY: 429 mg/dL — AB (ref 65–99)
GLUCOSE-CAPILLARY: 230 mg/dL — AB (ref 65–99)
Glucose-Capillary: 123 mg/dL — ABNORMAL HIGH (ref 65–99)
Glucose-Capillary: 231 mg/dL — ABNORMAL HIGH (ref 65–99)
Glucose-Capillary: 392 mg/dL — ABNORMAL HIGH (ref 65–99)

## 2015-03-09 LAB — PHOSPHORUS: Phosphorus: 4 mg/dL (ref 2.5–4.6)

## 2015-03-09 LAB — CREATININE, URINE, RANDOM: CREATININE, URINE: 39.9 mg/dL

## 2015-03-09 LAB — VITAMIN B12: Vitamin B-12: 117 pg/mL — ABNORMAL LOW (ref 180–914)

## 2015-03-09 LAB — FOLATE: Folate: 5.5 ng/mL — ABNORMAL LOW (ref >5.9)

## 2015-03-09 LAB — RETICULOCYTES
RBC.: 2.76 MIL/uL — AB (ref 4.22–5.81)
RETIC COUNT ABSOLUTE: 46.9 10*3/uL (ref 19.0–186.0)
Retic Ct Pct: 1.7 % (ref 0.4–3.1)

## 2015-03-09 LAB — MAGNESIUM: Magnesium: 1.6 mg/dL — ABNORMAL LOW (ref 1.7–2.4)

## 2015-03-09 LAB — SODIUM, URINE, RANDOM: Sodium, Ur: 87 mmol/L

## 2015-03-09 LAB — LIPASE, BLOOD: LIPASE: 54 U/L — AB (ref 11–51)

## 2015-03-09 MED ORDER — HEPARIN SODIUM (PORCINE) 5000 UNIT/ML IJ SOLN
5000.0000 [IU] | Freq: Three times a day (TID) | INTRAMUSCULAR | Status: DC
  Administered 2015-03-09 – 2015-03-14 (×15): 5000 [IU] via SUBCUTANEOUS
  Filled 2015-03-09 (×13): qty 1

## 2015-03-09 MED ORDER — INSULIN ASPART 100 UNIT/ML ~~LOC~~ SOLN
0.0000 [IU] | Freq: Three times a day (TID) | SUBCUTANEOUS | Status: DC
  Administered 2015-03-09 (×2): 15 [IU] via SUBCUTANEOUS
  Administered 2015-03-10: 11 [IU] via SUBCUTANEOUS
  Administered 2015-03-10: 5 [IU] via SUBCUTANEOUS
  Administered 2015-03-10: 8 [IU] via SUBCUTANEOUS
  Administered 2015-03-11: 5 [IU] via SUBCUTANEOUS
  Administered 2015-03-11 (×2): 3 [IU] via SUBCUTANEOUS
  Administered 2015-03-12: 2 [IU] via SUBCUTANEOUS
  Administered 2015-03-12: 8 [IU] via SUBCUTANEOUS
  Administered 2015-03-13: 5 [IU] via SUBCUTANEOUS
  Administered 2015-03-14 (×2): 2 [IU] via SUBCUTANEOUS
  Administered 2015-03-14: 3 [IU] via SUBCUTANEOUS

## 2015-03-09 MED ORDER — ACETAMINOPHEN 650 MG RE SUPP: 650.0000 mg | Freq: Four times a day (QID) | RECTAL | Status: DC | PRN

## 2015-03-09 MED ORDER — ALBUTEROL SULFATE (2.5 MG/3ML) 0.083% IN NEBU
2.5000 mg | INHALATION_SOLUTION | RESPIRATORY_TRACT | Status: DC | PRN
  Administered 2015-03-12: 2.5 mg via RESPIRATORY_TRACT
  Filled 2015-03-09 (×3): qty 3

## 2015-03-09 MED ORDER — INSULIN ASPART 100 UNIT/ML ~~LOC~~ SOLN
0.0000 [IU] | Freq: Every day | SUBCUTANEOUS | Status: DC
  Administered 2015-03-09 – 2015-03-11 (×3): 2 [IU] via SUBCUTANEOUS

## 2015-03-09 MED ORDER — SODIUM CHLORIDE 0.9 % IV SOLN
INTRAVENOUS | Status: DC
  Administered 2015-03-09 – 2015-03-10 (×2): via INTRAVENOUS

## 2015-03-09 MED ORDER — DOXYCYCLINE HYCLATE 100 MG PO TABS
100.0000 mg | ORAL_TABLET | Freq: Two times a day (BID) | ORAL | Status: DC
  Administered 2015-03-09 – 2015-03-14 (×12): 100 mg via ORAL
  Filled 2015-03-09 (×12): qty 1

## 2015-03-09 MED ORDER — SODIUM CHLORIDE 0.9% FLUSH
3.0000 mL | Freq: Two times a day (BID) | INTRAVENOUS | Status: DC
  Administered 2015-03-09 – 2015-03-14 (×7): 3 mL via INTRAVENOUS

## 2015-03-09 MED ORDER — NICOTINE 21 MG/24HR TD PT24
21.0000 mg | MEDICATED_PATCH | Freq: Every day | TRANSDERMAL | Status: DC
  Administered 2015-03-09 – 2015-03-14 (×6): 21 mg via TRANSDERMAL
  Filled 2015-03-09 (×6): qty 1

## 2015-03-09 MED ORDER — IPRATROPIUM BROMIDE 0.02 % IN SOLN
0.5000 mg | Freq: Four times a day (QID) | RESPIRATORY_TRACT | Status: DC
  Filled 2015-03-09: qty 2.5

## 2015-03-09 MED ORDER — TAMSULOSIN HCL 0.4 MG PO CAPS
0.4000 mg | ORAL_CAPSULE | Freq: Every day | ORAL | Status: DC
  Administered 2015-03-09 – 2015-03-14 (×6): 0.4 mg via ORAL
  Filled 2015-03-09 (×6): qty 1

## 2015-03-09 MED ORDER — IPRATROPIUM BROMIDE 0.02 % IN SOLN
0.5000 mg | Freq: Four times a day (QID) | RESPIRATORY_TRACT | Status: DC | PRN
  Administered 2015-03-09: 0.5 mg via RESPIRATORY_TRACT
  Filled 2015-03-09: qty 2.5

## 2015-03-09 MED ORDER — CALCIUM ACETATE (PHOS BINDER) 667 MG PO CAPS
1334.0000 mg | ORAL_CAPSULE | Freq: Three times a day (TID) | ORAL | Status: DC
  Administered 2015-03-09: 1334 mg via ORAL
  Administered 2015-03-09: 667 mg via ORAL
  Administered 2015-03-09 – 2015-03-14 (×12): 1334 mg via ORAL
  Filled 2015-03-09 (×15): qty 2

## 2015-03-09 MED ORDER — PREDNISONE 20 MG PO TABS
40.0000 mg | ORAL_TABLET | Freq: Two times a day (BID) | ORAL | Status: DC
  Administered 2015-03-09 (×2): 40 mg via ORAL
  Filled 2015-03-09 (×2): qty 2

## 2015-03-09 MED ORDER — INSULIN ASPART 100 UNIT/ML ~~LOC~~ SOLN
0.0000 [IU] | Freq: Three times a day (TID) | SUBCUTANEOUS | Status: DC
  Administered 2015-03-09: 3 [IU] via SUBCUTANEOUS

## 2015-03-09 MED ORDER — INSULIN ASPART 100 UNIT/ML ~~LOC~~ SOLN
0.0000 [IU] | Freq: Three times a day (TID) | SUBCUTANEOUS | Status: DC
Start: 2015-03-09 — End: 2015-03-09

## 2015-03-09 MED ORDER — ATORVASTATIN CALCIUM 10 MG PO TABS
10.0000 mg | ORAL_TABLET | Freq: Every day | ORAL | Status: DC
  Administered 2015-03-09 – 2015-03-14 (×6): 10 mg via ORAL
  Filled 2015-03-09 (×6): qty 1

## 2015-03-09 MED ORDER — IPRATROPIUM-ALBUTEROL 0.5-2.5 (3) MG/3ML IN SOLN
3.0000 mL | RESPIRATORY_TRACT | Status: DC
  Administered 2015-03-09 – 2015-03-11 (×14): 3 mL via RESPIRATORY_TRACT
  Filled 2015-03-09 (×15): qty 3

## 2015-03-09 MED ORDER — AMLODIPINE BESYLATE 10 MG PO TABS
10.0000 mg | ORAL_TABLET | Freq: Every day | ORAL | Status: DC
  Administered 2015-03-09 – 2015-03-14 (×6): 10 mg via ORAL
  Filled 2015-03-09 (×6): qty 1

## 2015-03-09 MED ORDER — GUAIFENESIN ER 600 MG PO TB12
600.0000 mg | ORAL_TABLET | Freq: Two times a day (BID) | ORAL | Status: DC
  Administered 2015-03-09 – 2015-03-14 (×12): 600 mg via ORAL
  Filled 2015-03-09 (×12): qty 1

## 2015-03-09 MED ORDER — FOLIC ACID 1 MG PO TABS
1.0000 mg | ORAL_TABLET | Freq: Every day | ORAL | Status: DC
  Administered 2015-03-09 – 2015-03-14 (×6): 1 mg via ORAL
  Filled 2015-03-09 (×6): qty 1

## 2015-03-09 MED ORDER — SODIUM BICARBONATE 650 MG PO TABS
1300.0000 mg | ORAL_TABLET | Freq: Two times a day (BID) | ORAL | Status: DC
  Administered 2015-03-09 – 2015-03-14 (×12): 1300 mg via ORAL
  Filled 2015-03-09 (×12): qty 2

## 2015-03-09 MED ORDER — INSULIN ASPART 100 UNIT/ML ~~LOC~~ SOLN: 0.0000 [IU] | Freq: Every day | SUBCUTANEOUS | Status: DC

## 2015-03-09 MED ORDER — VITAMIN B-12 100 MCG PO TABS
500.0000 ug | ORAL_TABLET | Freq: Every day | ORAL | Status: DC
  Administered 2015-03-09 – 2015-03-14 (×6): 500 ug via ORAL
  Filled 2015-03-09 (×6): qty 5

## 2015-03-09 MED ORDER — DIPHENHYDRAMINE HCL 25 MG PO CAPS
25.0000 mg | ORAL_CAPSULE | Freq: Once | ORAL | Status: AC
  Administered 2015-03-10: 25 mg via ORAL
  Filled 2015-03-09: qty 1

## 2015-03-09 MED ORDER — ONDANSETRON HCL 4 MG PO TABS
4.0000 mg | ORAL_TABLET | Freq: Four times a day (QID) | ORAL | Status: DC | PRN
  Filled 2015-03-09: qty 1

## 2015-03-09 MED ORDER — PREDNISONE 20 MG PO TABS
40.0000 mg | ORAL_TABLET | Freq: Every day | ORAL | Status: DC
  Administered 2015-03-10 – 2015-03-12 (×3): 40 mg via ORAL
  Filled 2015-03-09 (×3): qty 2

## 2015-03-09 MED ORDER — ACETAMINOPHEN 325 MG PO TABS: 650.0000 mg | ORAL_TABLET | Freq: Four times a day (QID) | ORAL | Status: DC | PRN

## 2015-03-09 MED ORDER — HYDROCODONE-ACETAMINOPHEN 5-325 MG PO TABS
1.0000 | ORAL_TABLET | ORAL | Status: DC | PRN
  Administered 2015-03-09 – 2015-03-13 (×11): 2 via ORAL
  Filled 2015-03-09 (×11): qty 2

## 2015-03-09 MED ORDER — ONDANSETRON HCL 4 MG/2ML IJ SOLN: 4.0000 mg | Freq: Four times a day (QID) | INTRAMUSCULAR | Status: DC | PRN

## 2015-03-09 NOTE — Progress Notes (Signed)
Called ED nurse for report. 

## 2015-03-09 NOTE — Progress Notes (Signed)
New Admission Note  Arrival:  Via Stretcher Mental Orientation: Alert & oriented X 4 Telemetry: Assessment: On tele Skin: Old Skin Scar on Mid abdomen [id number]  AC Pain: c/o of pain meds given Safety measures:  verbalized understanding. Bed in lowest position. Non-skid socks on.  Family:  No family at bedside. Orders have been reviewed and implemented. Will continue to monitor.

## 2015-03-09 NOTE — Progress Notes (Signed)
PROGRESS NOTE  [name] [id number] DOB: 02-05-1951 DOA: 03/08/2015 PCP: [facility]   HPI: 65 yo M with DM, HLD, prior partial colectomy for benign tumor, and prostate cancer recently finished XRT admitted 1/27 with abdominal pain and acute urinary retention.   Subjective / 24 H Interval events - abdominal discomfort improved - endorsing worsening wheezing this morning, and coughing  Assessment/Plan: Active Problems:   Essential hypertension   CHRONIC KIDNEY DISEASE STAGE III (MODERATE)   Diabetes mellitus (HCC)   Anemia   Acute on chronic renal failure (HCC)   Urinary retention   Kidney lesion, native, left   Pericardial effusion   COPD (chronic obstructive pulmonary disease) (HCC)   Tobacco abuse   Acute renal failure (ARF) (HCC)   Acute on chronic renal failure (HCC) on CKD III - most likely secondary to urinary retention - Foley placed overnight with good UOP, renal function with slight improvement today - hold ACEI - hydration overnight   Anemia due to CKD, folate and B12 deficiency - multifactorial in the setting of CKD, folate and B12 are low as well - start folic acid and [id number]  Urinary retention  - Place Foley will need it on discharge and outpatient follow-up with urology  Pericardial effusion  - CT scan has a propensity to overestimate pericardial effusion but given dyspnea and low voltage EKG Will obtain echogram   COPD (chronic obstructive pulmonary disease) Exacerbation (HCC)  - continue Doxycycline, prednisone - schedule duonebs given wheezing today   Tobacco abuse  - counseled again today   Essential hypertension  - hold his ACE inhibitor continue otherwise home medications  Renal lesion  - once renal function improves will need MRI per renal protocol, however this can be done in outpatient setting  DM - hyperglycemic today due to steroids likely - increase sliding scale to moderate  - change steroids to daily instead of  BID   Diet: Diet Carb Modified Fluid consistency:: Thin; Room service appropriate?: Yes Fluids: NS DVT Prophylaxis: heparin  Code Status: DNR Family Communication: no family bedside  Disposition Plan: home when ready  Barriers to discharge: COPD exacerbation, renal failure  Consultants:  None   Procedures:  None    Antibiotics Doxycycline 1/27 >>   Studies  Ct Abdomen Pelvis Wo Contrast  03/08/2015  CLINICAL DATA:  Abdominal pain and bloating. Bladder leakage. Undergoing radiation therapy for colon carcinoma. EXAM: CT ABDOMEN AND PELVIS WITHOUT CONTRAST TECHNIQUE: Multidetector CT imaging of the abdomen and pelvis was performed following the standard protocol without IV contrast. COMPARISON:  None. FINDINGS: Lower chest:  Normal heart size.  Small pericardial effusion noted. Hepatobiliary: No mass visualized on this un-enhanced exam. Gallbladder is unremarkable. Will Pancreas: No mass or inflammatory process identified on this un-enhanced exam. Spleen: Within normal limits in size. Adrenals/Urinary Tract: A 3 cm lesion is seen in the lower pole of the right kidney and a 2 cm lesion is seen in the upper pole the left kidney which have higher than fluid attenuation. These could represent a hemorrhagic cysts or solid renal masses. Other low-attenuation lesions cannot be characterized but likely represent small renal cysts. No evidence of ureteral calculi or hydronephrosis. The urinary bladder is markedly distended, but otherwise unremarkable in appearance on this noncontrast study. Stomach/Bowel: No evidence of obstruction, inflammatory process, or abnormal fluid collections. Surgical clips seen from previous partial right colectomy. Surgical mesh seen in anterior abdominal wall, without evidence of recurrent hernia. Vascular/Lymphatic: No pathologically enlarged lymph  nodes. No evidence of abdominal aortic aneurysm. Reproductive: No mass or other significant abnormality. Other: Bilateral  hip prostheses result in significant beam hardening artifact through the inferior pelvis. Musculoskeletal:  No suspicious bone lesions identified. IMPRESSION: No evidence of urolithiasis or hydronephrosis. Markedly distended urinary bladder ; recommend clinical correlation for urinary retention. Indeterminate lesions in lower pole of right kidney in upper pole of left kidney, which could represent hemorrhagic cysts or solid renal masses. Nonemergent outpatient abdomen MRI without and with contrast recommended for further characterization. Small pericardial effusion incidentally noted. Electronically Signed   By: Earle Gell M.D.   On: 03/08/2015 22:04   Dg Chest 2 View  03/08/2015  CLINICAL DATA:  Shortness of breath and congestion for 3 days. History of hypertension, diabetes, smoker. EXAM: CHEST  2 VIEW COMPARISON:  None. FINDINGS: Mild hyperinflation. Normal heart size and pulmonary vascularity. No focal airspace disease or consolidation in the lungs. No blunting of costophrenic angles. No pneumothorax. Degenerative changes in the spine. Mild thoracic scoliosis convex towards the right. IMPRESSION: No active cardiopulmonary disease. Electronically Signed   By: Lucienne Capers M.D.   On: 03/08/2015 22:47    Objective  Filed Vitals:   03/08/15 2300 03/09/15 0123 03/09/15 0510 03/09/15 0935  BP: 142/78 155/79 161/82 176/87  Pulse: 89 95 94 121  Temp:  98.7 F (37.1 C) 98.5 F (36.9 C) 98.2 F (36.8 C)  TempSrc:  Oral Oral Oral  Resp: 16 18 16 18   Height:  5' 11.5" (1.816 m)    Weight:  99.111 kg (218 lb 8 oz)    SpO2: 94% 94% 93% 96%    Intake/Output Summary (Last 24 hours) at 03/09/15 1133 Last data filed at 03/09/15 0900  Gross per 24 hour  Intake    240 ml  Output   1250 ml  Net  -1010 ml   Filed Weights   03/09/15 0123  Weight: 99.111 kg (218 lb 8 oz)    Exam:  GENERAL: NAD, audible wheezing   HEENT: no scleral icterus, PERRL  NECK: supple, no LAD  LUNGS: diffuse  wheezing bilateral lung fields  HEART: RRR without MRG  ABDOMEN: soft, non tender  EXTREMITIES: no clubbing / cyanosis  NEUROLOGIC: non focal   Data Reviewed: Basic Metabolic Panel:  Recent Labs Lab 03/08/15 2022 03/09/15 0442  NA 140 142  K 4.9 4.7  CL 106 108  CO2 25 23  GLUCOSE 81 122*  BUN 40* 37*  CREATININE 5.40* 5.07*  CALCIUM 8.6* 8.7*  MG  --  1.6*  PHOS  --  4.0   Liver Function Tests:  Recent Labs Lab 03/09/15 0442  AST 17  ALT 13*  ALKPHOS 50  BILITOT 0.4  PROT 6.0*  ALBUMIN 2.7*    Recent Labs Lab 03/09/15 0442  LIPASE 54*   CBC:  Recent Labs Lab 03/08/15 2022 03/09/15 0442  WBC 8.1 8.2  NEUTROABS 5.8  --   HGB 9.2* 8.5*  HCT 29.6* 27.6*  MCV 100.3* 100.0  PLT 304 313   CBG:  Recent Labs Lab 03/09/15 0130 03/09/15 0733  GLUCAP 123* 231*    Scheduled Meds: . amLODipine  10 mg Oral Daily  . atorvastatin  10 mg Oral Daily  . calcium acetate  1,334 mg Oral TID WC  . diphenhydrAMINE  25 mg Oral Once  . doxycycline  100 mg Oral Q12H  . guaiFENesin  600 mg Oral BID  . heparin  5,000 Units Subcutaneous 3 times per day  .  insulin aspart  0-5 Units Subcutaneous QHS  . insulin aspart  0-9 Units Subcutaneous TID WC  . insulin aspart  0-9 Units Subcutaneous TID WC  . ipratropium-albuterol  3 mL Nebulization Q4H  . nicotine  21 mg Transdermal Daily  . predniSONE  40 mg Oral BID  . sodium bicarbonate  1,300 mg Oral BID  . sodium chloride flush  3 mL Intravenous Q12H  . tamsulosin  0.4 mg Oral Daily   Continuous Infusions:   Marzetta Board, MD [facility] Pager [phone]. If 7 PM - 7 AM, please contact night-coverage at [email or web address], password [facility] 03/09/2015, 11:33 AM  LOS: 0 days

## 2015-03-10 DIAGNOSIS — J449 Chronic obstructive pulmonary disease, unspecified: Secondary | ICD-10-CM

## 2015-03-10 LAB — URINE CULTURE: Culture: 8000

## 2015-03-10 LAB — BASIC METABOLIC PANEL
ANION GAP: 7 (ref 5–15)
BUN: 56 mg/dL — ABNORMAL HIGH (ref 6–20)
CALCIUM: 8.5 mg/dL — AB (ref 8.9–10.3)
CO2: 20 mmol/L — AB (ref 22–32)
CREATININE: 5.18 mg/dL — AB (ref 0.61–1.24)
Chloride: 106 mmol/L (ref 101–111)
GFR, EST AFRICAN AMERICAN: 12 mL/min — AB (ref >60)
GFR, EST NON AFRICAN AMERICAN: 10 mL/min — AB (ref >60)
Glucose, Bld: 246 mg/dL — ABNORMAL HIGH (ref 65–99)
Potassium: 5.7 mmol/L — ABNORMAL HIGH (ref 3.5–5.1)
SODIUM: 133 mmol/L — AB (ref 135–145)

## 2015-03-10 LAB — GLUCOSE, CAPILLARY
GLUCOSE-CAPILLARY: 234 mg/dL — AB (ref 65–99)
GLUCOSE-CAPILLARY: 266 mg/dL — AB (ref 65–99)
GLUCOSE-CAPILLARY: 318 mg/dL — AB (ref 65–99)
GLUCOSE-CAPILLARY: 240 mg/dL — AB (ref 65–99)

## 2015-03-10 MED ORDER — SODIUM CHLORIDE 0.9 % IV SOLN
INTRAVENOUS | Status: DC
  Administered 2015-03-11: via INTRAVENOUS

## 2015-03-10 MED ORDER — DOCUSATE SODIUM 100 MG PO CAPS
100.0000 mg | ORAL_CAPSULE | Freq: Every day | ORAL | Status: DC | PRN
  Administered 2015-03-10 – 2015-03-11 (×3): 100 mg via ORAL
  Filled 2015-03-10 (×3): qty 1

## 2015-03-10 MED ORDER — POLYETHYLENE GLYCOL 3350 17 G PO PACK
17.0000 g | PACK | Freq: Every day | ORAL | Status: DC | PRN
  Administered 2015-03-10 – 2015-03-11 (×3): 17 g via ORAL
  Filled 2015-03-10 (×3): qty 1

## 2015-03-10 NOTE — Progress Notes (Signed)
PROGRESS NOTE  [name] [id number] DOB: 1950-02-25 DOA: 03/08/2015 PCP: [facility]   HPI: 65 yo M with DM, HLD, prior partial colectomy for benign tumor, and prostate cancer recently finished XRT admitted 1/27 with abdominal pain and acute urinary retention.   Subjective / 24 H Interval events - he is less short of breath today - no abdominal pain, nausea or vomiting - tells me that he wants all his care in [place]. Urology and Nephrology.  Assessment/Plan: Active Problems:   Essential hypertension   CHRONIC KIDNEY DISEASE STAGE III (MODERATE)   Diabetes mellitus (HCC)   Anemia   Acute on chronic renal failure (HCC)   Urinary retention   Kidney lesion, native, left   Pericardial effusion   COPD (chronic obstructive pulmonary disease) (HCC)   Tobacco abuse   Acute renal failure (ARF) (HCC)   Acute on chronic renal failure (HCC) on CKD III - most likely secondary to urinary retention - Foley placed in the ED with good UOP, renal function initially improving 1/28 however Cr is climbing slightly 1/29 and K is elevated to 5.7 - continue to hold ACEI - nephrology consulted, discussed with Dr. Jonnie Finner  Anemia due to CKD, folate and B12 deficiency - multifactorial in the setting of CKD, folate and B12 are low as well - start folic acid and 123456  Urinary retention  - Place Foley will need it on discharge and outpatient follow-up with urology  Pericardial effusion  - CT scan has a propensity to overestimate pericardial effusion but given dyspnea and low voltage EKG Will obtain echogram  - 2D echo still pending today   COPD (chronic obstructive pulmonary disease) Exacerbation (HCC)  - continue Doxycycline, prednisone - schedule duonebs  - wheezing still present however improved today  Tobacco abuse  - counseled again today, states that nicotine patches work and he has no cravings. - will prescribe on d/c  Essential hypertension  - hold his ACE  inhibitor continue otherwise home medications  Renal lesion  - once renal function improves will need MRI per renal protocol, however this can be done in outpatient setting  DM - hyperglycemic due to steroids likely, improved today after increasing sliding scale to moderate yesterday   Diet: Diet Carb Modified Fluid consistency:: Thin; Room service appropriate?: Yes Fluids: NS DVT Prophylaxis: heparin  Code Status: DNR Family Communication: no family bedside  Disposition Plan: home when ready, TBD Barriers to discharge: COPD exacerbation, renal failure  Consultants:  None   Procedures:  None    Antibiotics Doxycycline 1/27 >>   Studies  Ct Abdomen Pelvis Wo Contrast  03/08/2015  CLINICAL DATA:  Abdominal pain and bloating. Bladder leakage. Undergoing radiation therapy for colon carcinoma. EXAM: CT ABDOMEN AND PELVIS WITHOUT CONTRAST TECHNIQUE: Multidetector CT imaging of the abdomen and pelvis was performed following the standard protocol without IV contrast. COMPARISON:  None. FINDINGS: Lower chest:  Normal heart size.  Small pericardial effusion noted. Hepatobiliary: No mass visualized on this un-enhanced exam. Gallbladder is unremarkable. Will Pancreas: No mass or inflammatory process identified on this un-enhanced exam. Spleen: Within normal limits in size. Adrenals/Urinary Tract: A 3 cm lesion is seen in the lower pole of the right kidney and a 2 cm lesion is seen in the upper pole the left kidney which have higher than fluid attenuation. These could represent a hemorrhagic cysts or solid renal masses. Other low-attenuation lesions cannot be characterized but likely represent small renal cysts. No evidence of ureteral calculi  or hydronephrosis. The urinary bladder is markedly distended, but otherwise unremarkable in appearance on this noncontrast study. Stomach/Bowel: No evidence of obstruction, inflammatory process, or abnormal fluid collections. Surgical clips seen from  previous partial right colectomy. Surgical mesh seen in anterior abdominal wall, without evidence of recurrent hernia. Vascular/Lymphatic: No pathologically enlarged lymph nodes. No evidence of abdominal aortic aneurysm. Reproductive: No mass or other significant abnormality. Other: Bilateral hip prostheses result in significant beam hardening artifact through the inferior pelvis. Musculoskeletal:  No suspicious bone lesions identified. IMPRESSION: No evidence of urolithiasis or hydronephrosis. Markedly distended urinary bladder ; recommend clinical correlation for urinary retention. Indeterminate lesions in lower pole of right kidney in upper pole of left kidney, which could represent hemorrhagic cysts or solid renal masses. Nonemergent outpatient abdomen MRI without and with contrast recommended for further characterization. Small pericardial effusion incidentally noted. Electronically Signed   By: Earle Gell M.D.   On: 03/08/2015 22:04   Dg Chest 2 View  03/08/2015  CLINICAL DATA:  Shortness of breath and congestion for 3 days. History of hypertension, diabetes, smoker. EXAM: CHEST  2 VIEW COMPARISON:  None. FINDINGS: Mild hyperinflation. Normal heart size and pulmonary vascularity. No focal airspace disease or consolidation in the lungs. No blunting of costophrenic angles. No pneumothorax. Degenerative changes in the spine. Mild thoracic scoliosis convex towards the right. IMPRESSION: No active cardiopulmonary disease. Electronically Signed   By: Lucienne Capers M.D.   On: 03/08/2015 22:47    Objective  Filed Vitals:   03/10/15 0409 03/10/15 0448 03/10/15 0849 03/10/15 0941  BP:  145/74 150/73   Pulse:  97 95   Temp:  98.3 F (36.8 C) 98.5 F (36.9 C)   TempSrc:  Oral Oral   Resp:  16 18   Height:      Weight:      SpO2: 98% 93% 91% 86%    Intake/Output Summary (Last 24 hours) at 03/10/15 1010 Last data filed at 03/10/15 0849  Gross per 24 hour  Intake   2085 ml  Output   3650 ml    Net  -1565 ml   Filed Weights   03/09/15 0123 03/09/15 2016  Weight: 99.111 kg (218 lb 8 oz) 99.4 kg (219 lb 2.2 oz)    Exam:  GENERAL: NAD  HEENT: no scleral icterus, PERRL  NECK: supple, no LAD  LUNGS: diffuse wheezing bilateral lung fields, improved  HEART: RRR without MRG  ABDOMEN: soft, non tender  EXTREMITIES: no clubbing / cyanosis  NEUROLOGIC: non focal   Data Reviewed: Basic Metabolic Panel:  Recent Labs Lab 03/08/15 2022 03/09/15 0442 03/10/15 0507  NA 140 142 133*  K 4.9 4.7 5.7*  CL 106 108 106  CO2 25 23 20*  GLUCOSE 81 122* 246*  BUN 40* 37* 56*  CREATININE 5.40* 5.07* 5.18*  CALCIUM 8.6* 8.7* 8.5*  MG  --  1.6*  --   PHOS  --  4.0  --    Liver Function Tests:  Recent Labs Lab 03/09/15 0442  AST 17  ALT 13*  ALKPHOS 50  BILITOT 0.4  PROT 6.0*  ALBUMIN 2.7*    Recent Labs Lab 03/09/15 0442  LIPASE 54*   CBC:  Recent Labs Lab 03/08/15 2022 03/09/15 0442  WBC 8.1 8.2  NEUTROABS 5.8  --   HGB 9.2* 8.5*  HCT 29.6* 27.6*  MCV 100.3* 100.0  PLT 304 313   CBG:  Recent Labs Lab 03/09/15 0733 03/09/15 1138 03/09/15 1651 03/09/15 2127 03/10/15  0741  GLUCAP 231* 429* 392* 230* 234*    Scheduled Meds: . amLODipine  10 mg Oral Daily  . atorvastatin  10 mg Oral Daily  . calcium acetate  1,334 mg Oral TID WC  . doxycycline  100 mg Oral Q12H  . folic acid  1 mg Oral Daily  . guaiFENesin  600 mg Oral BID  . heparin  5,000 Units Subcutaneous 3 times per day  . insulin aspart  0-15 Units Subcutaneous TID WC  . insulin aspart  0-5 Units Subcutaneous QHS  . ipratropium-albuterol  3 mL Nebulization Q4H  . nicotine  21 mg Transdermal Daily  . predniSONE  40 mg Oral Q breakfast  . sodium bicarbonate  1,300 mg Oral BID  . sodium chloride flush  3 mL Intravenous Q12H  . tamsulosin  0.4 mg Oral Daily  . vitamin B-12  500 mcg Oral Daily   Continuous Infusions:   Marzetta Board, MD [facility] Pager [phone]. If 7  PM - 7 AM, please contact night-coverage at [email or web address], password [facility] 03/10/2015, 10:10 AM  LOS: 1 day

## 2015-03-10 NOTE — Progress Notes (Signed)
Utilization review completed.  

## 2015-03-10 NOTE — Consult Note (Signed)
Renal Service Consult Note [facility]  [name] 03/10/2015 [name] Requesting Physician:  Dr Cruzita Lederer  Reason for Consult:  Acute on CRF HPI: The patient is a 65 y.o. year-old with hx of HTN, DM2, COPD/ tobacco, HL, CKD who presented with abd pain/ distension and urinary incontinence.  In ED creat was up for 2 to 5.4, Hb down to 9.2.  CT abd/pelv showed distended bladder w/o hydro.  He has recent history of prostate cancer treated w radiation f/b [facility].  He was admitted and foley cath was placed. Creatinine came down some then went up by 0.10. Renal service asked to see for a/c renal failure.   Patient had prostate radiation 5 1/2 weeks, M-F , completed 2 wks ago.  Last week started to feel bad with lightheadedness and fatigue. Then developed some dysuria and was given Pyridium but still felt bad so came to ED on 1/27 here. Creat up 5 and renal US showed no hydronephrosis. UA on 1/27 was normal. He was found to have urinary retention and foley cath placed.  He had been having to wear diapers at night at home due to incontinence/ dripping of urine.   UA 1/27 > pos nitrite, >300 prot, pH 7.0, no rbcs, 0-5 wbcs, 1.012 CXR 1/27 no active disease  Date   Creat  eGFR  2009   1.94  36 2011   2.3  30 Mar 08, 2015  5.40 Jan 28   5.07 Jan 29   5.18  12   Prior hospital admissions: 12/2001 > vent hernia repair, hx partial colectomy for benign tumor Apr 2003, BPH, HTN, tobacco  ROS  no n/v/d  no abd pain  good appetite  Past Medical History  Past Medical History  Diagnosis Date  . Diabetes mellitus   . Hypertension   . High cholesterol   . Cancer ([place])   . Chronic kidney disease   . COPD (chronic obstructive pulmonary disease) ([place])   . Shortness of breath dyspnea    Past Surgical History  Past Surgical History  Procedure Laterality Date  . Total hip arthroplasty      bilat  . Knee surgery    . Colon resection    . Total shoulder arthroplasty       right   Family History  Family History  Problem Relation Age of Onset  . Heart failure Mother   . CAD Father   . Prostate cancer Neg Hx   . Kidney cancer Neg Hx    Social History  reports that he has been smoking Cigarettes.  He has been smoking about 0.03 packs per day. He does not have any smokeless tobacco history on file. He reports that he does not drink alcohol or use illicit drugs. Allergies  Allergies  Allergen Reactions  . Pravastatin     Muscle pain  . Simvastatin     Muscle pain   Home medications Prior to Admission medications   Medication Sig Start Date End Date Taking? Authorizing Provider  amLODipine (NORVASC) 10 MG tablet Take 10 mg by mouth daily.   Yes Historical Provider, MD  atorvastatin (LIPITOR) 20 MG tablet Take 10 mg by mouth daily.   Yes Historical Provider, MD  calcium acetate (PHOSLO) 667 MG capsule Take 1,334 mg by mouth 3 (three) times daily with meals.   Yes Historical Provider, MD  Cholecalciferol (VITAMIN D-3) 1000 units CAPS Take 1 capsule by mouth 2 (two) times daily.   Yes  Historical Provider, MD  furosemide (LASIX) 20 MG tablet Take 10 mg by mouth daily.   Yes Historical Provider, MD  glipiZIDE (GLUCOTROL) 10 MG tablet Take 20 mg by mouth 2 (two) times daily before a meal.    Yes Historical Provider, MD  lisinopril (PRINIVIL,ZESTRIL) 40 MG tablet Take 20 mg by mouth daily.   Yes Historical Provider, MD  phenazopyridine (PYRIDIUM) 100 MG tablet Take 100 mg by mouth 3 (three) times daily.   Yes Historical Provider, MD  sodium bicarbonate 650 MG tablet Take 1,300 mg by mouth 2 (two) times daily.   Yes Historical Provider, MD  tamsulosin (FLOMAX) 0.4 MG CAPS capsule Take 0.4 mg by mouth daily.   Yes Historical Provider, MD  bacitracin ointment Apply topically 2 (two) times daily. Apply bid affected area for 7 days Patient not taking: Reported on 03/08/2015 01/24/12   Rosana Hoes, MD  diphenhydrAMINE (BENADRYL) 25 MG tablet Take 1 tablet (25 mg  total) by mouth every 8 (eight) hours as needed for itching. 05/18/11 06/17/11  Clayton Bibles, PA-C  mineral oil-hydrophilic petrolatum (AQUAPHOR) ointment Apply topically as needed for dry skin. Apply daily after showers Patient not taking: Reported on 03/08/2015 01/24/12   Rosana Hoes, MD  minocycline (MINOCIN,DYNACIN) 100 MG capsule Take 1 capsule (100 mg total) by mouth 2 (two) times daily. Patient not taking: Reported on 03/08/2015 12/05/13   Billy Fischer, MD  triamcinolone cream (KENALOG) 0.1 % Apply topically 2 (two) times daily. Apply bid affected  areas Patient not taking: Reported on 03/08/2015 01/24/12   Rosana Hoes, MD   Liver Function Tests  Recent Labs Lab 03/09/15 0442  AST 17  ALT 13*  ALKPHOS 50  BILITOT 0.4  PROT 6.0*  ALBUMIN 2.7*    Recent Labs Lab 03/09/15 0442  LIPASE 54*   CBC  Recent Labs Lab 03/08/15 2022 03/09/15 0442  WBC 8.1 8.2  NEUTROABS 5.8  --   HGB 9.2* 8.5*  HCT 29.6* 27.6*  MCV 100.3* 100.0  PLT 304 478   Basic Metabolic Panel  Recent Labs Lab 03/08/15 2022 03/09/15 0442 03/10/15 0507  NA 140 142 133*  K 4.9 4.7 5.7*  CL 106 108 106  CO2 25 23 20*  GLUCOSE 81 122* 246*  BUN 40* 37* 56*  CREATININE 5.40* 5.07* 5.18*  CALCIUM 8.6* 8.7* 8.5*  PHOS  --  4.0  --     Filed Vitals:   03/10/15 0448 03/10/15 0849 03/10/15 0941 03/10/15 1233  BP: 145/74 150/73    Pulse: 97 95    Temp: 98.3 F (36.8 C) 98.5 F (36.9 C)    TempSrc: Oral Oral    Resp: 16 18    Height:      Weight:      SpO2: 93% 91% 86% 92%   Exam No distress, calm No rash, cyanosis or gangrene Sclera anicteric, throat clear No jvd or bruits Chest occ scattered wheezing, no rales, good air movement Cor reg tachy, soft SEM no RG ABd soft ntnd no ascites or hsm, obese GU foley in place, clear yellow urine MS no joint changes Ext no LE or UE edema Neuro R handed, nonfocal , Ox 3  A 1/27 > pos nitrite, >300 prot, pH 7.0, no rbcs, 0-5 wbcs, 1.012 CXR 1/27 no  active disease  Date   Creat  eGFR  2009   1.94  36 2011   2.3  30 Mar 08, 2015  5.40 Jan 28   5.07 Jan  29   5.18  12   Assessment: 1 Acute on chronic renal failure - unclear baseline, last creat here was 2.3 in 2011. F/B nephrologist at [facility] who has been talking w him about dialysis. Last appt 3 most ago. He says he has been resistant to consider dialysis. Not uremic now, hopefully w relief of obstruciton and holding lasix/ Acei and giving some IVF's he will recover some.  Either way he says he wants to move his kidney doctor to [place].  No vol excess, plan resume IVF's and repeat creat in am.  [facility] Monday.  2 Prostate Ca s/p XRT recently 3 BPH w urine retention, foley in place, good UOP 4 DM2 on oral meds 5 HTN acei on hold 6 Tobacco/ COPD 7 Volume no vol excess, CXR clear   Plan - IVF's, foley, [facility] records on Monday.   Kelly Splinter MD [employer] pager [phone]    cell [phone] 03/10/2015, 2:22 PM

## 2015-03-11 ENCOUNTER — Inpatient Hospital Stay (HOSPITAL_COMMUNITY): Payer: Medicare Other

## 2015-03-11 DIAGNOSIS — N185 Chronic kidney disease, stage 5: Secondary | ICD-10-CM

## 2015-03-11 DIAGNOSIS — R0600 Dyspnea, unspecified: Secondary | ICD-10-CM

## 2015-03-11 LAB — BASIC METABOLIC PANEL
ANION GAP: 8 (ref 5–15)
BUN: 60 mg/dL — ABNORMAL HIGH (ref 6–20)
CHLORIDE: 107 mmol/L (ref 101–111)
CO2: 21 mmol/L — AB (ref 22–32)
CREATININE: 5.1 mg/dL — AB (ref 0.61–1.24)
Calcium: 8.8 mg/dL — ABNORMAL LOW (ref 8.9–10.3)
GFR calc non Af Amer: 11 mL/min — ABNORMAL LOW (ref >60)
GFR, EST AFRICAN AMERICAN: 12 mL/min — AB (ref >60)
Glucose, Bld: 135 mg/dL — ABNORMAL HIGH (ref 65–99)
POTASSIUM: 5.1 mmol/L (ref 3.5–5.1)
Sodium: 136 mmol/L (ref 135–145)

## 2015-03-11 LAB — CBC
HEMATOCRIT: 26.1 % — AB (ref 39.0–52.0)
HEMOGLOBIN: 8.4 g/dL — AB (ref 13.0–17.0)
MCH: 31.8 pg (ref 26.0–34.0)
MCHC: 32.2 g/dL (ref 30.0–36.0)
MCV: 98.9 fL (ref 78.0–100.0)
Platelets: 313 10*3/uL (ref 150–400)
RBC: 2.64 MIL/uL — AB (ref 4.22–5.81)
RDW: 14.7 % (ref 11.5–15.5)
WBC: 13.1 10*3/uL — AB (ref 4.0–10.5)

## 2015-03-11 LAB — HEMOGLOBIN A1C
Hgb A1c MFr Bld: 6.2 % — ABNORMAL HIGH (ref 4.8–5.6)
Mean Plasma Glucose: 131 mg/dL

## 2015-03-11 LAB — GLUCOSE, CAPILLARY
GLUCOSE-CAPILLARY: 240 mg/dL — AB (ref 65–99)
Glucose-Capillary: 190 mg/dL — ABNORMAL HIGH (ref 65–99)
Glucose-Capillary: 194 mg/dL — ABNORMAL HIGH (ref 65–99)

## 2015-03-11 MED ORDER — IPRATROPIUM-ALBUTEROL 0.5-2.5 (3) MG/3ML IN SOLN
3.0000 mL | Freq: Four times a day (QID) | RESPIRATORY_TRACT | Status: DC
  Administered 2015-03-12 – 2015-03-14 (×8): 3 mL via RESPIRATORY_TRACT
  Filled 2015-03-11 (×9): qty 3

## 2015-03-11 MED ORDER — BENZONATATE 100 MG PO CAPS
100.0000 mg | ORAL_CAPSULE | Freq: Two times a day (BID) | ORAL | Status: DC | PRN
  Administered 2015-03-11 (×2): 100 mg via ORAL
  Filled 2015-03-11 (×2): qty 1

## 2015-03-11 MED ORDER — SODIUM CHLORIDE 0.9 % IV SOLN
510.0000 mg | Freq: Once | INTRAVENOUS | Status: AC
  Administered 2015-03-11: 510 mg via INTRAVENOUS
  Filled 2015-03-11 (×2): qty 17

## 2015-03-11 NOTE — Progress Notes (Signed)
  Echocardiogram 2D Echocardiogram has been performed.  Diamond Nickel 03/11/2015, 8:57 AM

## 2015-03-11 NOTE — Progress Notes (Signed)
PROGRESS NOTE  AMINE DRABIK [id number] DOB: 07/21/1950 DOA: 03/08/2015 PCP: [facility]   HPI: 65 yo M with DM, HLD, prior partial colectomy for benign tumor, and prostate cancer recently finished XRT admitted 1/27 with abdominal pain and acute urinary retention.   Subjective / 24 H Interval events - States breathing is better today, but still SOB with ambulation - No abdominal pain, nausea, or vomiting   Assessment/Plan: Active Problems:   Essential hypertension   CHRONIC KIDNEY DISEASE STAGE III (MODERATE)   Diabetes mellitus (HCC)   Anemia   Acute on chronic renal failure (HCC)   Urinary retention   Kidney lesion, native, left   Pericardial effusion   COPD (chronic obstructive pulmonary disease) (HCC)   Tobacco abuse   Acute renal failure (ARF) (HCC)  Acute on chronic renal failure (HCC) on CKD III - Most likely secondary to urinary retention and underlying chronic renal disease - Foley placed in the ED with good UOP, renal function initially improving 1/28, however Cr increased slightly on 1/29, and decreased to 5.10 on 1/30 - K was elevated to 5.7 on 1/29, and has decreased to 5.1 on 1/30 - Continue to hold ACEI - Nephrology consulted, discussed with Dr. Posey Pronto,  - vein mapping and VVS consult today  Anemia due to CKD, folate and B12 deficiency - Multifactorial in the setting of CKD, folate and B12 are low as well - Continue folic acid and 123456 - one dose Feraheme and will start ESA   Urinary retention  - Continue use of Foley, good UOP, will need it on discharge and outpatient follow-up with urology  Pericardial effusion  - CT scan has a propensity to overestimate pericardial effusion but given dyspnea and low voltage EKG, obtained 2D echocardiogram which shows normal EF and grade II diastolic dysfunction  COPD (chronic obstructive pulmonary disease) Exacerbation (HCC)  - Continue Doxycycline and Prednisone - Continue scheduled duonebs  -  Wheezing still present however improved today - mild leukocytosis likely steroid induced  Tobacco abuse  - Counseled again today - Has stated that nicotine patches work and he has no cravings - Will prescribe on d/c  Essential hypertension  - Hold his ACE inhibitor, continue otherwise home medications - BP fair today at 142/75, continue current regimen   Renal lesion  - Once renal function improves will need MRI per renal protocol, however this can be done in outpatient setting  DM - CBGs 130-190s, continue current regimen    Diet: Diet Carb Modified Fluid consistency:: Thin; Room service appropriate?: Yes Fluids: None DVT Prophylaxis: Heparin  Code Status: DNR Family Communication: None at bedside Disposition Plan: Home when stable, 1-2 days  Barriers to discharge: COPD exacerbation, renal failure  Consultants:  Nephrology   Procedures:  2D echo Study Conclusions - Left ventricle: The cavity size was normal. Wall thickness wasincreased in a pattern of mild LVH. Systolic function was normal.The estimated ejection fraction was in the range of 60% to 65%.Features are consistent with a pseudonormal left ventricularfilling pattern, with concomitant abnormal relaxation andincreased filling pressure (grade 2 diastolic dysfunction).   Antibiotics Doxycycline started 1/28 >>   Studies  No results found.  Objective  Filed Vitals:   03/11/15 0007 03/11/15 0306 03/11/15 0548 03/11/15 0927  BP:   138/86 142/75  Pulse:   107 112  Temp:   98.3 F (36.8 C) 98.4 F (36.9 C)  TempSrc:   Oral Oral  Resp:   18   Height:  Weight:      SpO2: 97% 89% 92% 90%    Intake/Output Summary (Last 24 hours) at 03/11/15 1043 Last data filed at 03/11/15 0900  Gross per 24 hour  Intake   1710 ml  Output   1875 ml  Net   -165 ml   Filed Weights   03/09/15 0123 03/09/15 2016  Weight: 99.111 kg (218 lb 8 oz) 99.4 kg (219 lb 2.2 oz)    Exam:  GENERAL: NAD  HEENT:  no scleral icterus, PERRL  NECK: supple, no LAD  LUNGS: diffuse wheezing bilateral lung fields, improved  HEART: RRR without MRG  ABDOMEN: soft, non tender  EXTREMITIES: no clubbing / cyanosis, no LE edema  NEUROLOGIC: non focal  PSYCHIATRIC: normal mood and affect   Data Reviewed: Basic Metabolic Panel:  Recent Labs Lab 03/08/15 2022 03/09/15 0442 03/10/15 0507 03/11/15 0550  NA 140 142 133* 136  K 4.9 4.7 5.7* 5.1  CL 106 108 106 107  CO2 25 23 20* 21*  GLUCOSE 81 122* 246* 135*  BUN 40* 37* 56* 60*  CREATININE 5.40* 5.07* 5.18* 5.10*  CALCIUM 8.6* 8.7* 8.5* 8.8*  MG  --  1.6*  --   --   PHOS  --  4.0  --   --    Liver Function Tests:  Recent Labs Lab 03/09/15 0442  AST 17  ALT 13*  ALKPHOS 50  BILITOT 0.4  PROT 6.0*  ALBUMIN 2.7*    Recent Labs Lab 03/09/15 0442  LIPASE 54*   CBC:  Recent Labs Lab 03/08/15 2022 03/09/15 0442 03/11/15 0550  WBC 8.1 8.2 13.1*  NEUTROABS 5.8  --   --   HGB 9.2* 8.5* 8.4*  HCT 29.6* 27.6* 26.1*  MCV 100.3* 100.0 98.9  PLT 304 313 313   CBG:  Recent Labs Lab 03/10/15 0741 03/10/15 1200 03/10/15 1655 03/10/15 2055 03/11/15 0738  GLUCAP 234* 266* 318* 240* 190*    Recent Results (from the past 240 hour(s))  Urine culture     Status: None   Collection Time: 03/08/15  4:42 PM  Result Value Ref Range Status   Specimen Description URINE, CLEAN CATCH  Final   Special Requests NONE  Final   Culture 8,000 COLONIES/mL INSIGNIFICANT GROWTH  Final   Report Status 03/10/2015 FINAL  Final     Scheduled Meds: . amLODipine  10 mg Oral Daily  . atorvastatin  10 mg Oral Daily  . calcium acetate  1,334 mg Oral TID WC  . doxycycline  100 mg Oral Q12H  . ferumoxytol  510 mg Intravenous Once  . folic acid  1 mg Oral Daily  . guaiFENesin  600 mg Oral BID  . heparin  5,000 Units Subcutaneous 3 times per day  . insulin aspart  0-15 Units Subcutaneous TID WC  . insulin aspart  0-5 Units Subcutaneous QHS  .  ipratropium-albuterol  3 mL Nebulization Q4H  . nicotine  21 mg Transdermal Daily  . predniSONE  40 mg Oral Q breakfast  . sodium bicarbonate  1,300 mg Oral BID  . sodium chloride flush  3 mL Intravenous Q12H  . tamsulosin  0.4 mg Oral Daily  . vitamin B-12  500 mcg Oral Daily   Continuous Infusions:    Marzetta Board, MD [facility] Pager [phone]. If 7 PM - 7 AM, please contact night-coverage at [email or web address], password [facility] 03/11/2015, 10:43 AM  LOS: 2 days

## 2015-03-11 NOTE — Progress Notes (Signed)
Patient ID: [name], male   DOB: September 18, 1950, 65 y.o.   MRN: [id number]  [facility] Progress Note   Assessment/ Plan:   1. Acute renal failure on chronic kidney disease stage IV: Suspected to be from a combination of bladder outlet obstruction status post relief with Foley catheter and hemodynamically mediated in the face of ongoing ACE inhibitor/furosemide. Renal function essentially unchanged overnight but fortunately without any acute electrolyte abnormality (he is on sodium bicarbonate for chronic metabolic acidosis of CKD), no uremic symptoms or critical volume overload to prompt need to intervene with dialysis. Will consult with vascular surgery for dialysis access planning. Discontinue intravenous fluids. Awaiting 2-D echocardiogram to evaluate his pericardial effusion further. 2. Prostate cancer status post radiation therapy: With urinary retention and status post Foley catheter placement with good urine output. 3. Hypertension: Diuretics and ACE inhibitor currently on hold because of acute renal failure, 4. 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 5. Metabolic bone disease: Phosphorus levels acceptable at 4.0 on calcium acetate 3 times a day before meals, await records regarding PTH.  Subjective:   Reports to be feeling fair-denies any chest pain or shortness of breath    Objective:   BP 142/75 mmHg  Pulse 112  Temp(Src) 98.4 F (36.9 C) (Oral)  Resp 18  Ht 5' 11.5" (1.816 m)  Wt 99.4 kg (219 lb 2.2 oz)  BMI 30.14 kg/m2  SpO2 90%  Intake/Output Summary (Last 24 hours) at 03/11/15 0949 Last data filed at 03/11/15 0900  Gross per 24 hour  Intake   1710 ml  Output   1875 ml  Net   -165 ml   Weight change:   Physical Exam: Gen: Comfortably resting in bed-watching television CVS: Pulse regular tachycardia, S1 and S2 without murmur or rub Resp:  Decreased breath sounds over bases otherwise clear to auscultation Abd: Soft, obese, nontender, bowel sounds normal Ext: No lower extremity edema noted  Imaging: No results found.  Labs: BMET  Recent Labs Lab 03/08/15 2022 03/09/15 0442 03/10/15 0507 03/11/15 0550  NA 140 142 133* 136  K 4.9 4.7 5.7* 5.1  CL 106 108 106 107  CO2 25 23 20* 21*  GLUCOSE 81 122* 246* 135*  BUN 40* 37* 56* 60*  CREATININE 5.40* 5.07* 5.18* 5.10*  CALCIUM 8.6* 8.7* 8.5* 8.8*  PHOS  --  4.0  --   --    CBC  Recent Labs Lab 03/08/15 2022 03/09/15 0442 03/11/15 0550  WBC 8.1 8.2 13.1*  NEUTROABS 5.8  --   --   HGB 9.2* 8.5* 8.4*  HCT 29.6* 27.6* 26.1*  MCV 100.3* 100.0 98.9  PLT 304 313 313   Medications:    . amLODipine  10 mg Oral Daily  . atorvastatin  10 mg Oral Daily  . calcium acetate  1,334 mg Oral TID WC  . doxycycline  100 mg Oral Q12H  . folic acid  1 mg Oral Daily  . guaiFENesin  600 mg Oral BID  . heparin  5,000 Units Subcutaneous 3 times per day  . insulin aspart  0-15 Units Subcutaneous TID WC  . insulin aspart  0-5 Units Subcutaneous QHS  . ipratropium-albuterol  3 mL Nebulization Q4H  . nicotine  21 mg Transdermal Daily  . predniSONE  40 mg Oral Q breakfast  . sodium bicarbonate  1,300 mg Oral BID  . sodium chloride  flush  3 mL Intravenous Q12H  . tamsulosin  0.4 mg Oral Daily  . vitamin B-12  500 mcg Oral Daily   Elmarie Shiley, MD 03/11/2015, 9:49 AM

## 2015-03-11 NOTE — Consult Note (Signed)
Hospital Consult    Reason for Consult:  In need of permanent HD access Referring Physician:  Dr. Posey Pronto MRN #:  [id number]  History of Present Illness: This is a 65 y.o. male who presented to the hospital 3 days ago with abdominal distension and pain as well as some urinary incontinence having to wear diapers at night.   He did have a CT scan, which revealed a distended bladder w/o hydronephrosis.  A foley catheter was placed.  In the ER his creatinine was up to 5.4 from 2.  After the foley was placed, his creatinine did improve, however it did worsen again.  Also on CT scan, he was found to have a pericardial effusion.  He underwent an echo this morning to evaluate this.  Pt has hx of colon cancer and Prostate cancer undergoing prostate radiation ~ 6 weeks ago and was completed 2 weeks ago at [facility].    The pt is on a statin for cholesterol management.   He is on glipidize for diabetes.  He is on Norvasc and was on Lisinopril (until discontinued this hospitalization) for hypertension.  He does use inhalers for COPD and states he is going to quit smoking.  He smokes about 1 pack every 2.5 days.  Past Medical History  Diagnosis Date  . Diabetes mellitus   . Hypertension   . High cholesterol   . Cancer ([place])   . Chronic kidney disease   . COPD (chronic obstructive pulmonary disease) ([place])   . Shortness of breath dyspnea     Past Surgical History  Procedure Laterality Date  . Total hip arthroplasty      bilat  . Knee surgery    . Colon resection    . Total shoulder arthroplasty      right    Allergies  Allergen Reactions  . Pravastatin     Muscle pain  . Simvastatin     Muscle pain    Prior to Admission medications   Medication Sig Start Date End Date Taking? Authorizing Provider  amLODipine (NORVASC) 10 MG tablet Take 10 mg by mouth daily.   Yes Historical Provider, MD  atorvastatin (LIPITOR) 20 MG tablet Take 10 mg by mouth daily.   Yes Historical Provider, MD    calcium acetate (PHOSLO) 667 MG capsule Take 1,334 mg by mouth 3 (three) times daily with meals.   Yes Historical Provider, MD  Cholecalciferol (VITAMIN D-3) 1000 units CAPS Take 1 capsule by mouth 2 (two) times daily.   Yes Historical Provider, MD  furosemide (LASIX) 20 MG tablet Take 10 mg by mouth daily.   Yes Historical Provider, MD  glipiZIDE (GLUCOTROL) 10 MG tablet Take 20 mg by mouth 2 (two) times daily before a meal.    Yes Historical Provider, MD  lisinopril (PRINIVIL,ZESTRIL) 40 MG tablet Take 20 mg by mouth daily.   Yes Historical Provider, MD  phenazopyridine (PYRIDIUM) 100 MG tablet Take 100 mg by mouth 3 (three) times daily.   Yes Historical Provider, MD  sodium bicarbonate 650 MG tablet Take 1,300 mg by mouth 2 (two) times daily.   Yes Historical Provider, MD  tamsulosin (FLOMAX) 0.4 MG CAPS capsule Take 0.4 mg by mouth daily.   Yes Historical Provider, MD  bacitracin ointment Apply topically 2 (two) times daily. Apply bid affected area for 7 days Patient not taking: Reported on 03/08/2015 01/24/12   Rosana Hoes, MD  diphenhydrAMINE (BENADRYL) 25 MG tablet Take 1 tablet (25 mg total) by mouth every  8 (eight) hours as needed for itching. 05/18/11 06/17/11  Clayton Bibles, PA-C  mineral oil-hydrophilic petrolatum (AQUAPHOR) ointment Apply topically as needed for dry skin. Apply daily after showers Patient not taking: Reported on 03/08/2015 01/24/12   Rosana Hoes, MD  minocycline (MINOCIN,DYNACIN) 100 MG capsule Take 1 capsule (100 mg total) by mouth 2 (two) times daily. Patient not taking: Reported on 03/08/2015 12/05/13   Billy Fischer, MD  triamcinolone cream (KENALOG) 0.1 % Apply topically 2 (two) times daily. Apply bid affected  areas Patient not taking: Reported on 03/08/2015 01/24/12   Rosana Hoes, MD    Social History   Social History  . Marital Status: Legally Separated    Spouse Name: N/A  . Number of Children: N/A  . Years of Education: N/A   Occupational History  . Not on  file.   Social History Main Topics  . Smoking status: Current Some Day Smoker -- 0.03 packs/day    Types: Cigarettes  . Smokeless tobacco: Not on file  . Alcohol Use: No  . Drug Use: No  . Sexual Activity: Not on file   Other Topics Concern  . Not on file   Social History Narrative     Family History  Problem Relation Age of Onset  . Heart failure Mother   . CAD Father   . Prostate cancer Neg Hx   . Kidney cancer Neg Hx     ROS: [x]  Positive   [ ]  Negative   [ ]  All sytems reviewed and are negative  Cardiovascular: []  chest pain/pressure []  palpitations []  SOB lying flat [x]  DOE []  pain in legs while walking []  pain in legs at rest []  pain in legs at night []  non-healing ulcers []  hx of DVT []  swelling in legs  Pulmonary: [x]   Cough/COPD [x]  wheezing []  home O2  Neurologic: []  weakness in []  arms []  legs []  numbness in []  arms []  legs []  hx of CVA []  mini stroke [] difficulty speaking or slurred speech []  temporary loss of vision in one eye []  dizziness  Hematologic: []  hx of cancer []  bleeding problems []  problems with blood clotting easily [x]  anemic  Endocrine:   [x]  diabetes []  thyroid disease  GI []  vomiting blood []  blood in stool [x]  abdominal pain  GU: [x]  CKD/renal failure []  HD--[]  M/W/F or []  T/T/S [x]  burning with urination [x]  straining with urination []  blood in urine  Psychiatric: []  anxiety []  depression  Musculoskeletal: []  arthritis []  joint pain  Integumentary: []  rashes []  ulcers  Constitutional: []  fever [x]  chills   Physical Examination  Filed Vitals:   03/11/15 0548 03/11/15 0927  BP: 138/86 142/75  Pulse: 107 112  Temp: 98.3 F (36.8 C) 98.4 F (36.9 C)  Resp: 18    Body mass index is 30.14 kg/(m^2).  General:  WDWN in NAD Gait: Not observed HENT: WNL, normocephalic Pulmonary: normal non-labored breathing, without Rales, rhonchi,  wheezing Cardiac: regular, without  Murmurs, rubs or gallops;  without carotid bruits Abdomen:  soft, NT/ND, no masses; well healed laparotomy scar Skin: without rashes Vascular Exam/Pulses:  Right Left  Radial 2+ (normal) 2+ (normal)  Ulnar Unable to palpate  Unable to palpate   Femoral 2+ (normal) 2+ (normal)  Popliteal Unable to palpate  Unable to palpate   DP 2+ (normal) 2+ (normal)  PT 2+ (normal) Unable to palpate    Extremities: without ischemic changes, without Gangrene , without cellulitis; without open wounds;  Musculoskeletal: no muscle wasting or  atrophy  Neurologic: A&O X 3; moving all extremities equally. Speech is fluent/normal Psychiatric:  Normal affect Lymph:  No inguinal lympadenopathy   CBC    Component Value Date/Time   WBC 13.1* 03/11/2015 0550   RBC 2.64* 03/11/2015 0550   RBC 2.76* 03/09/2015 0442   HGB 8.4* 03/11/2015 0550   HCT 26.1* 03/11/2015 0550   PLT 313 03/11/2015 0550   MCV 98.9 03/11/2015 0550   MCH 31.8 03/11/2015 0550   MCHC 32.2 03/11/2015 0550   RDW 14.7 03/11/2015 0550   LYMPHSABS 1.3 03/08/2015 2022   MONOABS 0.6 03/08/2015 2022   EOSABS 0.3 03/08/2015 2022   BASOSABS 0.0 03/08/2015 2022    BMET    Component Value Date/Time   NA 136 03/11/2015 0550   K 5.1 03/11/2015 0550   CL 107 03/11/2015 0550   CO2 21* 03/11/2015 0550   GLUCOSE 135* 03/11/2015 0550   BUN 60* 03/11/2015 0550   CREATININE 5.10* 03/11/2015 0550   CALCIUM 8.8* 03/11/2015 0550   GFRNONAA 11* 03/11/2015 0550   GFRAA 12* 03/11/2015 0550    COAGS: Lab Results  Component Value Date   INR 1.7* 09/11/2007   INR 1.5 09/10/2007   INR 1.1 09/09/2007     Non-Invasive Vascular Imaging:   Vein mapping ordered and pending.  Statin:  Yes.   Beta Blocker:  No. Aspirin:  No. ACEI:  Yes.   (was on this at home, but has been d/c'd due to renal dysfunction) ARB:  No. Other antiplatelets/anticoagulants:  Yes.   SQ heparin for DVT prophylaxis.   ASSESSMENT/PLAN: This is a 65 y.o. male with acute renal failure on CKD  4 in need of permanent HD access who is right arm dominant.   -his vein mapping is pending, but once done, we will determine best plan for access -He does have an IV in the left antecubital space.  We will have them move this to the left arm and restrict left arm. -discussed fistula vs graft and steal syndrome with pt as well as if he needs HD sooner, he would require tunneled catheter.   Leontine Locket, PA-C [facility] [phone]  Most likely multifactorial CKD.  Awaiting vein map.  Most likely left arm access later this week. 2+ brachial and radial pulses on exam.  No obvious veins visible on skin surface  Will follow up post vein map  Access options discussed with pt  [name], MD [facility] [place]: [phone] Pager: [phone]

## 2015-03-12 ENCOUNTER — Inpatient Hospital Stay (HOSPITAL_COMMUNITY): Payer: Medicare Other

## 2015-03-12 DIAGNOSIS — N185 Chronic kidney disease, stage 5: Secondary | ICD-10-CM

## 2015-03-12 LAB — RENAL FUNCTION PANEL
ALBUMIN: 2.5 g/dL — AB (ref 3.5–5.0)
ANION GAP: 10 (ref 5–15)
BUN: 66 mg/dL — AB (ref 6–20)
CALCIUM: 8.8 mg/dL — AB (ref 8.9–10.3)
CO2: 21 mmol/L — ABNORMAL LOW (ref 22–32)
Chloride: 106 mmol/L (ref 101–111)
Creatinine, Ser: 5.18 mg/dL — ABNORMAL HIGH (ref 0.61–1.24)
GFR calc Af Amer: 12 mL/min — ABNORMAL LOW (ref >60)
GFR calc non Af Amer: 10 mL/min — ABNORMAL LOW (ref >60)
GLUCOSE: 106 mg/dL — AB (ref 65–99)
PHOSPHORUS: 4.4 mg/dL (ref 2.5–4.6)
Potassium: 5.3 mmol/L — ABNORMAL HIGH (ref 3.5–5.1)
SODIUM: 137 mmol/L (ref 135–145)

## 2015-03-12 LAB — GLUCOSE, CAPILLARY
GLUCOSE-CAPILLARY: 96 mg/dL (ref 65–99)
GLUCOSE-CAPILLARY: 138 mg/dL — AB (ref 65–99)
GLUCOSE-CAPILLARY: 165 mg/dL — AB (ref 65–99)
Glucose-Capillary: 256 mg/dL — ABNORMAL HIGH (ref 65–99)

## 2015-03-12 MED ORDER — PREDNISONE 20 MG PO TABS
30.0000 mg | ORAL_TABLET | Freq: Every day | ORAL | Status: DC
  Administered 2015-03-13 – 2015-03-14 (×2): 30 mg via ORAL
  Filled 2015-03-12 (×2): qty 1

## 2015-03-12 MED ORDER — FUROSEMIDE 20 MG PO TABS
20.0000 mg | ORAL_TABLET | Freq: Every day | ORAL | Status: DC
  Administered 2015-03-12 – 2015-03-14 (×2): 20 mg via ORAL
  Filled 2015-03-12 (×3): qty 1

## 2015-03-12 MED ORDER — DEXTROSE 5 % IV SOLN
1.5000 g | INTRAVENOUS | Status: AC
  Filled 2015-03-12: qty 1.5

## 2015-03-12 MED ORDER — DARBEPOETIN ALFA 100 MCG/0.5ML IJ SOSY
100.0000 ug | PREFILLED_SYRINGE | INTRAMUSCULAR | Status: DC
  Administered 2015-03-12: 100 ug via SUBCUTANEOUS
  Filled 2015-03-12: qty 0.5

## 2015-03-12 NOTE — Care Management Important Message (Signed)
Important Message  Patient Details  Name: [name] MRN: [id number] Date of Birth: 02/14/1950   Medicare Important Message Given:  Yes    Barb Merino Shatavia Santor 03/12/2015, 3:32 PM

## 2015-03-12 NOTE — Progress Notes (Signed)
PROGRESS NOTE  [name] [id number] DOB: 1950/03/19 DOA: 03/08/2015 PCP: [facility]  HPI: 65 yo M with DM, HLD, prior partial colectomy for benign tumor, and prostate cancer recently finished XRT admitted 1/27 with abdominal pain and acute urinary retention.   Interim Summary Patient was admitted on 1/27 with post-obstructive renal failure in the setting of BPH as well as prostate cancer status post recent radiation therapy at the [facility]. Foley catheter was placed with improvement on his urine output and decompression of his bladder, however with essentially no improvement in his renal function. Nephrology was consulted at that time. Vascular surgery also consulted, patient is to undergo vein mapping and left arm AV access tomorrow. He also had COPD exacerbation on admission, and this is improving.  Subjective / 24 H Interval events - States his breathing is continuing to improve but still has intermittent SOB with ambulation - Denies HA, abdominal pain, nausea, or vomiting  Assessment/Plan: Active Problems:   Essential hypertension   CHRONIC KIDNEY DISEASE STAGE III (MODERATE)   Diabetes mellitus (HCC)   Anemia   Acute on chronic renal failure (HCC)   Urinary retention   Kidney lesion, native, left   Pericardial effusion   COPD (chronic obstructive pulmonary disease) (HCC)   Tobacco abuse   Acute renal failure (ARF) (HCC)   Acute on chronic renal failure (HCC) on CKD III - Most likely secondary to urinary retention and underlying chronic renal disease - Foley placed in the ED with good UOP, Cr remains elevated however - Continue to hold ACEI - Nephrology consulted, appreciate input - Vein mapping and VVS consult 1/30, pending results, plan for AV access tomorrow 02/01   Anemia due to CKD, folate and B12 deficiency - Multifactorial in the setting of CKD, folate and B12 are low as well - Continue folic acid and [id number] - One dose Feraheme 1/30 and will  start ESA   Urinary retention Due to BPH/prostate cancer status post XRT - Continue use of Foley, good UOP, will need it on discharge and outpatient follow-up with urology - Patient wants to establish with urology here in [place], will give information about local group on discharge  Pericardial effusion  - CT scan has a propensity to overestimate pericardial effusion but given dyspnea and low voltage EKG, obtained 2D echocardiogram which shows normal EF and grade II diastolic dysfunction and no evidence of significant pericardial effusion   COPD (chronic obstructive pulmonary disease) Exacerbation (HCC)  - Continue Doxycycline and Prednisone - Continue scheduled duonebs  - Wheezing still present however markedly improved today - Mild leukocytosis likely steroid induced - Start tapering the prednisone tomorrow   Tobacco abuse  - Counseled again today - Has stated that nicotine patches work and he has no cravings - Will prescribe on d/c  Essential hypertension  - Hold his ACE inhibitor, continue otherwise home medications - BP today at 145/71, continue current regimen   Renal lesion  - Once renal function improves will need MRI per renal protocol, however this can be done in outpatient setting  DM - CBGs 130-190s, continue current regimen  - CBG 106 on 1/31    Diet: Diet Carb Modified Fluid consistency:: Thin; Room service appropriate?: Yes Diet NPO time specified Except for: Sips with Meds Fluids: None DVT Prophylaxis: Heparin  Code Status: DNR Family Communication: None at bedside Disposition Plan: Remain inpatient, AV access planned for 02/01 Barriers to discharge: COPD exacerbation, renal failure  Consultants:  Nephrology  VVS  Procedures:  2D echo Study Conclusions - Left ventricle: The cavity size was normal. Wall thickness wasincreased in a pattern of mild LVH. Systolic function was normal.The estimated ejection fraction was in the range of 60%  to 65%.Features are consistent with a pseudonormal left ventricularfilling pattern, with concomitant abnormal relaxation andincreased filling pressure (grade 2 diastolic dysfunction).   Antibiotics Doxycycline started 1/28 >>   Studies  No results found.  Objective  Filed Vitals:   03/11/15 1920 03/12/15 0030 03/12/15 0809 03/12/15 0837  BP:   145/71   Pulse:   84   Temp:   98.6 F (37 C)   TempSrc:   Oral   Resp:   17   Height:      Weight:      SpO2: 88% 81% 97% 90%    Intake/Output Summary (Last 24 hours) at 03/12/15 1128 Last data filed at 03/12/15 1003  Gross per 24 hour  Intake    480 ml  Output   3525 ml  Net  -3045 ml   Filed Weights   03/09/15 0123 03/09/15 2016  Weight: 99.111 kg (218 lb 8 oz) 99.4 kg (219 lb 2.2 oz)    Exam:  GENERAL: Awake, alert in NAD  LUNGS: Diffuse wheezing bilateral lung fields, markedly improved   HEART: RRR without MRG  ABDOMEN: Soft, non tender  EXTREMITIES: No LE edema  Data Reviewed: Basic Metabolic Panel:  Recent Labs Lab 03/08/15 2022 03/09/15 0442 03/10/15 0507 03/11/15 0550 03/12/15 0734  NA 140 142 133* 136 137  K 4.9 4.7 5.7* 5.1 5.3*  CL 106 108 106 107 106  CO2 25 23 20* 21* 21*  GLUCOSE 81 122* 246* 135* 106*  BUN 40* 37* 56* 60* 66*  CREATININE 5.40* 5.07* 5.18* 5.10* 5.18*  CALCIUM 8.6* 8.7* 8.5* 8.8* 8.8*  MG  --  1.6*  --   --   --   PHOS  --  4.0  --   --  4.4   Liver Function Tests:  Recent Labs Lab 03/09/15 0442 03/12/15 0734  AST 17  --   ALT 13*  --   ALKPHOS 50  --   BILITOT 0.4  --   PROT 6.0*  --   ALBUMIN 2.7* 2.5*    Recent Labs Lab 03/09/15 0442  LIPASE 54*   CBC:  Recent Labs Lab 03/08/15 2022 03/09/15 0442 03/11/15 0550  WBC 8.1 8.2 13.1*  NEUTROABS 5.8  --   --   HGB 9.2* 8.5* 8.4*  HCT 29.6* 27.6* 26.1*  MCV 100.3* 100.0 98.9  PLT 304 313 313   CBG:  Recent Labs Lab 03/10/15 2055 03/11/15 0738 03/11/15 1143 03/11/15 1642 03/12/15 0808    GLUCAP 240* 190* 194* 240* 96    Recent Results (from the past 240 hour(s))  Urine culture     Status: None   Collection Time: 03/08/15  4:42 PM  Result Value Ref Range Status   Specimen Description URINE, CLEAN CATCH  Final   Special Requests NONE  Final   Culture 8,000 COLONIES/mL INSIGNIFICANT GROWTH  Final   Report Status 03/10/2015 FINAL  Final     Scheduled Meds: . amLODipine  10 mg Oral Daily  . atorvastatin  10 mg Oral Daily  . calcium acetate  1,334 mg Oral TID WC  . [START ON 03/13/2015] cefUROXime (ZINACEF)  IV  1.5 g Intravenous On Call to OR  . darbepoetin (ARANESP) injection - NON-DIALYSIS  100 mcg Subcutaneous Q  Tue-1800  . doxycycline  100 mg Oral Q12H  . folic acid  1 mg Oral Daily  . furosemide  20 mg Oral Daily  . guaiFENesin  600 mg Oral BID  . heparin  5,000 Units Subcutaneous 3 times per day  . insulin aspart  0-15 Units Subcutaneous TID WC  . insulin aspart  0-5 Units Subcutaneous QHS  . ipratropium-albuterol  3 mL Nebulization QID  . nicotine  21 mg Transdermal Daily  . predniSONE  40 mg Oral Q breakfast  . sodium bicarbonate  1,300 mg Oral BID  . sodium chloride flush  3 mL Intravenous Q12H  . tamsulosin  0.4 mg Oral Daily  . vitamin B-12  500 mcg Oral Daily   Continuous Infusions:   Marzetta Board, MD [facility] Pager [phone]. If 7 PM - 7 AM, please contact night-coverage at [email or web address], password [facility] 03/12/2015, 11:28 AM  LOS: 3 days

## 2015-03-12 NOTE — Progress Notes (Signed)
Right  Upper Extremity Vein Map    Cephalic  Segment Diameter Depth Comment  1. Axilla 2.27mm 14.17mm   2. Mid upper arm 4.38mm 2.43mm   3. Above [facility] 3.59mm 2.11mm Tortuous  4. In [facility] 5.4mm 3.50mm   5. Below AC 2.55mm 4.37mm Multiple branches  6. Mid forearm 3.64mm 2.64mm   7. Wrist 3.43mm 2.59mm                   Basilic  Segment Diameter Depth Comment  1. Axilla   Thrombosed  2. Mid upper arm   Unable to visualize  3. Above [facility]   Unable to visualize  4. In [facility]   Unable to visualize  5. Below AC   Unable to visualize  6. Mid forearm   Unable to visualize  7. Wrist   Unable to visualize                  Left Upper Extremity Vein Map    Cephalic  Segment Diameter Depth Comment  1. Axilla 3.4mm 10.54mm   2. Mid upper arm 2.40mm 2.78mm   3. Above [facility] 1.69mm 1.20mm   4. In [facility] 2.54mm 1.77mm   5. Below AC 2.25mm 4.65mm Multiple branches  6. Mid forearm 1.19mm 1.70mm Multiple branches  7. Wrist 2.63mm 2.68mm                   Basilic  Segment Diameter Depth Comment  1. Axilla 6.31mm 8.56mm   2. Mid upper arm 5.73mm 8.67mm Multiple branches  3. Above [facility] 5.43mm 5.93mm Branch  4. In AC 3.53mm 2.74mm Branch  5. Below AC 2.35mm 1.49mm Branch  6. Mid forearm 1.71mm 2.91mm   7. Wrist 2.77mm 2.58mm                   03/12/2015 2:38 PM [name], RVT, RDCS, RDMS

## 2015-03-12 NOTE — Consult Note (Signed)
Vein map still pending.  Plan for left arm AV access tomorrow fistula vs graft depending on vein map.  Plans discussed with pt  Consent  NPO p midnight  [name], MD [facility] [place] Office: [phone] Pager: [phone]

## 2015-03-12 NOTE — Progress Notes (Addendum)
Patient ID: F…LIX RIVERS, male   DOB: November 01, 1950, 65 y.o.   MRN: [id number]  [facility] Progress Note   Assessment/ Plan:   1. Acute renal failure on chronic kidney disease stage IV: Suspected to be from a combination of bladder outlet obstruction status post relief with Foley catheter and hemodynamically mediated in the face of ongoing ACE inhibitor/furosemide. Renal function unchanged and mild hyperkalemia noted. Trivial pericardial effusion on 2D Echo. Will restart furosemide at 20mg  daily for augmenting UOP and lowering potassium. Vein mapping pending. Appreciate input from vascular surgery. 2. Prostate cancer status post radiation therapy: With urinary retention and status post Foley catheter placement with good urine output. 3. Hypertension: Diuretics and ACE inhibitor held because of acute renal failure- restart low dose lasix 4. 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 5. Metabolic bone disease: Phosphorus levels acceptable at 4.0 on calcium acetate 3 times a day before meals, await records regarding PTH.  Subjective:   Reports to be feeling fair except for a productive cough-denies any chest pain or shortness of breath    Objective:   BP 145/71 mmHg  Pulse 84  Temp(Src) 98.6 F (37 C) (Oral)  Resp 17  Ht 5' 11.5" (1.816 m)  Wt 99.4 kg (219 lb 2.2 oz)  BMI 30.14 kg/m2  SpO2 90%  Intake/Output Summary (Last 24 hours) at 03/12/15 0959 Last data filed at 03/12/15 [id number]  Gross per 24 hour  Intake    360 ml  Output   3050 ml  Net  -2690 ml   Weight change:   Physical Exam: Gen: Comfortably resting in bed CVS: Pulse regular, normal rate, S1 and S2 without murmur or rub Resp: Decreased breath sounds over bases otherwise clear to auscultation Abd: Soft, obese, nontender, bowel sounds normal Ext: No lower extremity edema noted  Imaging: No results  found.  Labs: BMET  Recent Labs Lab 03/08/15 2022 03/09/15 0442 03/10/15 0507 03/11/15 0550 03/12/15 0734  NA 140 142 133* 136 137  K 4.9 4.7 5.7* 5.1 5.3*  CL 106 108 106 107 106  CO2 25 23 20* 21* 21*  GLUCOSE 81 122* 246* 135* 106*  BUN 40* 37* 56* 60* 66*  CREATININE 5.40* 5.07* 5.18* 5.10* 5.18*  CALCIUM 8.6* 8.7* 8.5* 8.8* 8.8*  PHOS  --  4.0  --   --  4.4   CBC  Recent Labs Lab 03/08/15 2022 03/09/15 0442 03/11/15 0550  WBC 8.1 8.2 13.1*  NEUTROABS 5.8  --   --   HGB 9.2* 8.5* 8.4*  HCT 29.6* 27.6* 26.1*  MCV 100.3* 100.0 98.9  PLT 304 313 313   Medications:    . amLODipine  10 mg Oral Daily  . atorvastatin  10 mg Oral Daily  . calcium acetate  1,334 mg Oral TID WC  . doxycycline  100 mg Oral Q12H  . folic acid  1 mg Oral Daily  . guaiFENesin  600 mg Oral BID  . heparin  5,000 Units Subcutaneous 3 times per day  . insulin aspart  0-15 Units Subcutaneous TID WC  . insulin aspart  0-5 Units Subcutaneous QHS  . ipratropium-albuterol  3 mL Nebulization QID  . nicotine  21 mg Transdermal Daily  . predniSONE  40 mg Oral Q breakfast  . sodium bicarbonate  1,300 mg Oral BID  . sodium chloride flush  3 mL Intravenous Q12H  .  tamsulosin  0.4 mg Oral Daily  . vitamin B-12  500 mcg Oral Daily   Elmarie Shiley, MD 03/12/2015, 9:59 AM

## 2015-03-13 ENCOUNTER — Inpatient Hospital Stay (HOSPITAL_COMMUNITY): Payer: Medicare Other | Admitting: Certified Registered Nurse Anesthetist

## 2015-03-13 ENCOUNTER — Encounter (HOSPITAL_COMMUNITY): Payer: Self-pay | Admitting: Certified Registered Nurse Anesthetist

## 2015-03-13 ENCOUNTER — Encounter (HOSPITAL_COMMUNITY)
Admission: EM | Disposition: A | Discharge status: Home / Self Care | Payer: Self-pay | Source: Home / Self Care | Attending: Internal Medicine

## 2015-03-13 ENCOUNTER — Other Ambulatory Visit: Payer: Self-pay | Admitting: *Deleted

## 2015-03-13 DIAGNOSIS — N184 Chronic kidney disease, stage 4 (severe): Secondary | ICD-10-CM

## 2015-03-13 DIAGNOSIS — N185 Chronic kidney disease, stage 5: Secondary | ICD-10-CM

## 2015-03-13 DIAGNOSIS — N189 Chronic kidney disease, unspecified: Secondary | ICD-10-CM

## 2015-03-13 DIAGNOSIS — N179 Acute kidney failure, unspecified: Secondary | ICD-10-CM

## 2015-03-13 DIAGNOSIS — I319 Disease of pericardium, unspecified: Secondary | ICD-10-CM

## 2015-03-13 DIAGNOSIS — I10 Essential (primary) hypertension: Secondary | ICD-10-CM

## 2015-03-13 DIAGNOSIS — Z4931 Encounter for adequacy testing for hemodialysis: Secondary | ICD-10-CM

## 2015-03-13 DIAGNOSIS — Z720 Tobacco use: Secondary | ICD-10-CM

## 2015-03-13 DIAGNOSIS — R339 Retention of urine, unspecified: Secondary | ICD-10-CM

## 2015-03-13 DIAGNOSIS — N186 End stage renal disease: Secondary | ICD-10-CM

## 2015-03-13 HISTORY — PX: AV FISTULA PLACEMENT: SHX1204

## 2015-03-13 LAB — SURGICAL PCR SCREEN
MRSA, PCR: POSITIVE — AB
Staphylococcus aureus: POSITIVE — AB

## 2015-03-13 LAB — BASIC METABOLIC PANEL
ANION GAP: 11 (ref 5–15)
Anion gap: 8 (ref 5–15)
BUN: 70 mg/dL — ABNORMAL HIGH (ref 6–20)
BUN: 68 mg/dL — ABNORMAL HIGH (ref 6–20)
CALCIUM: 9.1 mg/dL (ref 8.9–10.3)
CHLORIDE: 110 mmol/L (ref 101–111)
CO2: 21 mmol/L — AB (ref 22–32)
CO2: 22 mmol/L (ref 22–32)
Calcium: 9 mg/dL (ref 8.9–10.3)
Chloride: 107 mmol/L (ref 101–111)
Creatinine, Ser: 5.35 mg/dL — ABNORMAL HIGH (ref 0.61–1.24)
Creatinine, Ser: 5.46 mg/dL — ABNORMAL HIGH (ref 0.61–1.24)
GFR calc Af Amer: 12 mL/min — ABNORMAL LOW (ref >60)
GFR calc non Af Amer: 10 mL/min — ABNORMAL LOW (ref >60)
GFR, EST AFRICAN AMERICAN: 11 mL/min — AB (ref >60)
GFR, EST NON AFRICAN AMERICAN: 10 mL/min — AB (ref >60)
GLUCOSE: 90 mg/dL (ref 65–99)
GLUCOSE: 125 mg/dL — AB (ref 65–99)
POTASSIUM: 6.1 mmol/L — AB (ref 3.5–5.1)
Potassium: 5.6 mmol/L — ABNORMAL HIGH (ref 3.5–5.1)
SODIUM: 139 mmol/L (ref 135–145)
SODIUM: 140 mmol/L (ref 135–145)

## 2015-03-13 LAB — CBC
HCT: 27 % — ABNORMAL LOW (ref 39.0–52.0)
Hemoglobin: 8.9 g/dL — ABNORMAL LOW (ref 13.0–17.0)
MCH: 32.2 pg (ref 26.0–34.0)
MCHC: 33 g/dL (ref 30.0–36.0)
MCV: 97.8 fL (ref 78.0–100.0)
PLATELETS: 310 10*3/uL (ref 150–400)
RBC: 2.76 MIL/uL — AB (ref 4.22–5.81)
RDW: 14.8 % (ref 11.5–15.5)
WBC: 13.4 10*3/uL — AB (ref 4.0–10.5)

## 2015-03-13 LAB — GLUCOSE, CAPILLARY
GLUCOSE-CAPILLARY: 97 mg/dL (ref 65–99)
GLUCOSE-CAPILLARY: 115 mg/dL — AB (ref 65–99)
GLUCOSE-CAPILLARY: 234 mg/dL — AB (ref 65–99)
GLUCOSE-CAPILLARY: 176 mg/dL — AB (ref 65–99)
Glucose-Capillary: 158 mg/dL — ABNORMAL HIGH (ref 65–99)

## 2015-03-13 SURGERY — ARTERIOVENOUS (AV) FISTULA CREATION
Anesthesia: General | Site: Arm Upper | Laterality: Left

## 2015-03-13 MED ORDER — LIDOCAINE HCL (PF) 1 % IJ SOLN
INTRAMUSCULAR | Status: AC
  Filled 2015-03-13: qty 30

## 2015-03-13 MED ORDER — FENTANYL CITRATE (PF) 250 MCG/5ML IJ SOLN
INTRAMUSCULAR | Status: AC
  Filled 2015-03-13: qty 5

## 2015-03-13 MED ORDER — FENTANYL CITRATE (PF) 100 MCG/2ML IJ SOLN
INTRAMUSCULAR | Status: DC | PRN
  Administered 2015-03-13: 50 ug via INTRAVENOUS
  Administered 2015-03-13: 100 ug via INTRAVENOUS

## 2015-03-13 MED ORDER — ALBUTEROL SULFATE (2.5 MG/3ML) 0.083% IN NEBU: 2.5000 mg | INHALATION_SOLUTION | Freq: Once | RESPIRATORY_TRACT | Status: DC

## 2015-03-13 MED ORDER — MIDAZOLAM HCL 2 MG/2ML IJ SOLN
INTRAMUSCULAR | Status: AC
  Filled 2015-03-13: qty 2

## 2015-03-13 MED ORDER — HYDROMORPHONE HCL 1 MG/ML IJ SOLN
INTRAMUSCULAR | Status: AC
  Filled 2015-03-13: qty 1

## 2015-03-13 MED ORDER — LIDOCAINE HCL (CARDIAC) 20 MG/ML IV SOLN
INTRAVENOUS | Status: DC | PRN
  Administered 2015-03-13: 60 mg via INTRAVENOUS

## 2015-03-13 MED ORDER — CHLORHEXIDINE GLUCONATE CLOTH 2 % EX PADS
6.0000 | MEDICATED_PAD | Freq: Every day | CUTANEOUS | Status: DC
  Administered 2015-03-13 – 2015-03-14 (×2): 6 via TOPICAL

## 2015-03-13 MED ORDER — LIDOCAINE HCL (PF) 1 % IJ SOLN: INTRAMUSCULAR | Status: DC | PRN

## 2015-03-13 MED ORDER — SODIUM CHLORIDE 0.9 % IV SOLN
1.0000 g | Freq: Once | INTRAVENOUS | Status: AC
  Administered 2015-03-13: 1 g via INTRAVENOUS
  Filled 2015-03-13 (×3): qty 10

## 2015-03-13 MED ORDER — EPHEDRINE SULFATE 50 MG/ML IJ SOLN
INTRAMUSCULAR | Status: AC
  Filled 2015-03-13: qty 1

## 2015-03-13 MED ORDER — PROPOFOL 10 MG/ML IV BOLUS
INTRAVENOUS | Status: DC | PRN
  Administered 2015-03-13: 200 mg via INTRAVENOUS

## 2015-03-13 MED ORDER — FUROSEMIDE 10 MG/ML IJ SOLN
40.0000 mg | Freq: Once | INTRAMUSCULAR | Status: AC
  Administered 2015-03-13: 40 mg via INTRAVENOUS
  Filled 2015-03-13: qty 4

## 2015-03-13 MED ORDER — PROPOFOL 10 MG/ML IV BOLUS
INTRAVENOUS | Status: AC
  Filled 2015-03-13: qty 20

## 2015-03-13 MED ORDER — PHENYLEPHRINE 40 MCG/ML (10ML) SYRINGE FOR IV PUSH (FOR BLOOD PRESSURE SUPPORT)
PREFILLED_SYRINGE | INTRAVENOUS | Status: AC
  Filled 2015-03-13: qty 10

## 2015-03-13 MED ORDER — EPHEDRINE SULFATE 50 MG/ML IJ SOLN
INTRAMUSCULAR | Status: DC | PRN
  Administered 2015-03-13 (×4): 5 mg via INTRAVENOUS

## 2015-03-13 MED ORDER — SODIUM POLYSTYRENE SULFONATE 15 GM/60ML PO SUSP: 30.0000 g | Freq: Once | ORAL | Status: DC

## 2015-03-13 MED ORDER — 0.9 % SODIUM CHLORIDE (POUR BTL) OPTIME
TOPICAL | Status: DC | PRN
  Administered 2015-03-13: 1000 mL

## 2015-03-13 MED ORDER — LACTATED RINGERS IV SOLN: INTRAVENOUS | Status: DC | PRN

## 2015-03-13 MED ORDER — PHENYLEPHRINE HCL 10 MG/ML IJ SOLN
INTRAMUSCULAR | Status: DC | PRN
  Administered 2015-03-13 (×2): 40 ug via INTRAVENOUS
  Administered 2015-03-13: 80 ug via INTRAVENOUS
  Administered 2015-03-13 (×2): 40 ug via INTRAVENOUS

## 2015-03-13 MED ORDER — CEFAZOLIN SODIUM-DEXTROSE 2-3 GM-% IV SOLR
INTRAVENOUS | Status: DC | PRN
  Administered 2015-03-13: 2 g via INTRAVENOUS

## 2015-03-13 MED ORDER — SODIUM POLYSTYRENE SULFONATE 15 GM/60ML PO SUSP
30.0000 g | Freq: Once | ORAL | Status: AC
  Administered 2015-03-13: 30 g via ORAL
  Filled 2015-03-13: qty 120

## 2015-03-13 MED ORDER — HEPARIN SODIUM (PORCINE) 1000 UNIT/ML IJ SOLN
INTRAMUSCULAR | Status: DC | PRN
  Administered 2015-03-13: 5000 [IU] via INTRAVENOUS

## 2015-03-13 MED ORDER — MIDAZOLAM HCL 5 MG/5ML IJ SOLN
INTRAMUSCULAR | Status: DC | PRN
  Administered 2015-03-13: 2 mg via INTRAVENOUS

## 2015-03-13 MED ORDER — LIDOCAINE HCL (CARDIAC) 20 MG/ML IV SOLN
INTRAVENOUS | Status: AC
  Filled 2015-03-13: qty 5

## 2015-03-13 MED ORDER — SODIUM CHLORIDE 0.9 % IV SOLN
INTRAVENOUS | Status: DC
  Administered 2015-03-13: 14:00:00 via INTRAVENOUS

## 2015-03-13 MED ORDER — HEPARIN SODIUM (PORCINE) 5000 UNIT/ML IJ SOLN
INTRAMUSCULAR | Status: DC | PRN
  Administered 2015-03-13: 15:00:00

## 2015-03-13 MED ORDER — HYDROMORPHONE HCL 1 MG/ML IJ SOLN
0.2500 mg | INTRAMUSCULAR | Status: DC | PRN
  Administered 2015-03-13: 0.25 mg via INTRAVENOUS

## 2015-03-13 MED ORDER — MUPIROCIN 2 % EX OINT
1.0000 "application " | TOPICAL_OINTMENT | Freq: Two times a day (BID) | CUTANEOUS | Status: DC
  Administered 2015-03-13 – 2015-03-14 (×3): 1 via NASAL
  Filled 2015-03-13 (×2): qty 22

## 2015-03-13 SURGICAL SUPPLY — 39 items
ARMBAND PINK RESTRICT EXTREMIT (MISCELLANEOUS) ×2 IMPLANT
CANISTER SUCTION 2500CC (MISCELLANEOUS) ×2 IMPLANT
CANNULA VESSEL 3MM 2 BLNT TIP (CANNULA) ×4 IMPLANT
CLIP TI MEDIUM 6 (CLIP) ×2 IMPLANT
CLIP TI WIDE RED SMALL 6 (CLIP) ×4 IMPLANT
COVER PROBE W GEL 5X96 (DRAPES) IMPLANT
DECANTER SPIKE VIAL GLASS SM (MISCELLANEOUS) ×2 IMPLANT
DRAIN PENROSE 1/4X12 LTX STRL (WOUND CARE) ×2 IMPLANT
ELECT REM PT RETURN 9FT ADLT (ELECTROSURGICAL) ×2
ELECTRODE REM PT RTRN 9FT ADLT (ELECTROSURGICAL) ×1 IMPLANT
GLOVE BIO SURGEON STRL SZ 6.5 (GLOVE) ×4 IMPLANT
GLOVE BIO SURGEON STRL SZ7.5 (GLOVE) ×2 IMPLANT
GLOVE BIOGEL PI IND STRL 6.5 (GLOVE) ×4 IMPLANT
GLOVE BIOGEL PI IND STRL 7.5 (GLOVE) ×2 IMPLANT
GLOVE BIOGEL PI IND STRL 8 (GLOVE) ×1 IMPLANT
GLOVE BIOGEL PI INDICATOR 6.5 (GLOVE) ×4
GLOVE BIOGEL PI INDICATOR 7.5 (GLOVE) ×2
GLOVE BIOGEL PI INDICATOR 8 (GLOVE) ×1
GLOVE ECLIPSE 6.5 STRL STRAW (GLOVE) ×6 IMPLANT
GOWN STRL REUS W/ TWL LRG LVL3 (GOWN DISPOSABLE) ×6 IMPLANT
GOWN STRL REUS W/TWL LRG LVL3 (GOWN DISPOSABLE) ×6
KIT BASIN OR (CUSTOM PROCEDURE TRAY) ×2 IMPLANT
KIT ROOM TURNOVER OR (KITS) ×2 IMPLANT
LIQUID BAND (GAUZE/BANDAGES/DRESSINGS) ×2 IMPLANT
LOOP VESSEL MINI RED (MISCELLANEOUS) IMPLANT
NS IRRIG 1000ML POUR BTL (IV SOLUTION) ×2 IMPLANT
PACK CV ACCESS (CUSTOM PROCEDURE TRAY) ×2 IMPLANT
PAD ARMBOARD 7.5X6 YLW CONV (MISCELLANEOUS) ×4 IMPLANT
SPONGE SURGIFOAM ABS GEL 100 (HEMOSTASIS) IMPLANT
SUT PROLENE 7 0 BV 1 (SUTURE) ×2 IMPLANT
SUT SILK 2 0 SH (SUTURE) ×2 IMPLANT
SUT SILK 3 0 (SUTURE) ×1
SUT SILK 3-0 18XBRD TIE 12 (SUTURE) ×1 IMPLANT
SUT VIC AB 3-0 SH 27 (SUTURE) ×3
SUT VIC AB 3-0 SH 27X BRD (SUTURE) ×3 IMPLANT
SUT VICRYL 4-0 PS2 18IN ABS (SUTURE) ×10 IMPLANT
SYRINGE 20CC LL (MISCELLANEOUS) ×2 IMPLANT
UNDERPAD 30X30 INCONTINENT (UNDERPADS AND DIAPERS) ×2 IMPLANT
WATER STERILE IRR 1000ML POUR (IV SOLUTION) ×2 IMPLANT

## 2015-03-13 NOTE — H&P (View-Only) (Signed)
Vein map still pending.  Plan for left arm AV access tomorrow fistula vs graft depending on vein map.  Plans discussed with pt  Consent  NPO p midnight  [name], MD [facility] Office: [phone] Pager: [phone]

## 2015-03-13 NOTE — Transfer of Care (Signed)
Immediate Anesthesia Transfer of Care Note  Patient: [name]  Procedure(s) Performed: Procedure(s): Left arm basilic vein transposition . (Left)  Patient Location: PACU  Anesthesia Type:General  Level of Consciousness: awake, alert  and oriented  Airway & Oxygen Therapy: Patient Spontanous Breathing and Patient connected to nasal cannula oxygen  Post-op Assessment: Report given to RN and Post -op Vital signs reviewed and stable  Post vital signs: Reviewed and stable  Last Vitals:  Filed Vitals:   03/13/15 0900 03/13/15 1700  BP: 116/66 157/71  Pulse: 72 85  Temp: 36.7 C 36.7 C  Resp: 18 24    Complications: No apparent anesthesia complications

## 2015-03-13 NOTE — Progress Notes (Signed)
Patient ID: [name], male   DOB: [birth year], 65 y.o.   MRN: [id number]  [facility] Progress Note   Assessment/ Plan:   1. Acute renal failure on chronic kidney disease stage IV: Suspected to be from a combination of bladder outlet obstruction status post relief with Foley catheter and hemodynamically mediated in the face of ongoing ACE inhibitor/furosemide. Slightly higher creatinine noted from today's a.m. labs however, concerning hyperkalemia which has been treated with oral Kayexalate and intravenous furosemide-repeat labs pending at noon. He is then scheduled for left brachiobasilic transposition fistula later today. In my judgment, he does not have any compelling needs to start dialysis at this point and we can defer dialysis catheter placement for now. 2. Prostate cancer status post radiation therapy: With urinary retention and status post Foley catheter placement with good urine output. 3. Hypertension: Diuretics and ACE inhibitor held because of acute renal failure- restart low dose lasix 4. 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 5. Metabolic bone disease: Phosphorus levels acceptable at 4.4 on calcium acetate 3 times a day before meals.  Subjective:   Reports to be feeling well, denies any chest pain, nausea, vomiting or dysgeusia. Has some mild shortness of breath-not limiting to his ambulation.    Objective:   BP 116/66 mmHg  Pulse 72  Temp(Src) 98 F (36.7 C) (Oral)  Resp 18  Ht 5' 11.5" (1.816 m)  Wt 98.4 kg (216 lb 14.9 oz)  BMI 29.84 kg/m2  SpO2 95%  Intake/Output Summary (Last 24 hours) at 03/13/15 1025 Last data filed at 03/13/15 0900  Gross per 24 hour  Intake    960 ml  Output   2800 ml  Net  -1840 ml   Weight change:   Physical Exam: Gen: Comfortably resting in bed CVS: Pulse regular, normal rate, S1 and S2 without murmur or  rub Resp: Decreased breath sounds over bases otherwise clear to auscultation Abd: Soft, obese, nontender, bowel sounds normal Ext: No lower extremity edema noted  Imaging: No results found.  Labs: BMET  Recent Labs Lab 03/08/15 2022 03/09/15 0442 03/10/15 0507 03/11/15 0550 03/12/15 0734 03/13/15 0459  NA 140 142 133* 136 137 139  K 4.9 4.7 5.7* 5.1 5.3* 6.1*  CL 106 108 106 107 106 110  CO2 25 23 20* 21* 21* 21*  GLUCOSE 81 122* 246* 135* 106* 90  BUN 40* 37* 56* 60* 66* 70*  CREATININE 5.40* 5.07* 5.18* 5.10* 5.18* 5.35*  CALCIUM 8.6* 8.7* 8.5* 8.8* 8.8* 9.0  PHOS  --  4.0  --   --  4.4  --    CBC  Recent Labs Lab 03/08/15 2022 03/09/15 0442 03/11/15 0550 03/13/15 0459  WBC 8.1 8.2 13.1* 13.4*  NEUTROABS 5.8  --   --   --   HGB 9.2* 8.5* 8.4* 8.9*  HCT 29.6* 27.6* 26.1* 27.0*  MCV 100.3* 100.0 98.9 97.8  PLT 304 313 313 310   Medications:    . amLODipine  10 mg Oral Daily  . atorvastatin  10 mg Oral Daily  . calcium acetate  1,334 mg Oral TID WC  . calcium gluconate  1 g Intravenous Once  . cefUROXime (ZINACEF)  IV  1.5 g Intravenous To SS-Surg  . darbepoetin (ARANESP) injection - NON-DIALYSIS  100 mcg Subcutaneous Q Tue-1800  . doxycycline  100 mg Oral Q12H  . folic acid  1  mg Oral Daily  . furosemide  20 mg Oral Daily  . guaiFENesin  600 mg Oral BID  . heparin  5,000 Units Subcutaneous 3 times per day  . insulin aspart  0-15 Units Subcutaneous TID WC  . insulin aspart  0-5 Units Subcutaneous QHS  . ipratropium-albuterol  3 mL Nebulization QID  . nicotine  21 mg Transdermal Daily  . predniSONE  30 mg Oral Q breakfast  . sodium bicarbonate  1,300 mg Oral BID  . sodium chloride flush  3 mL Intravenous Q12H  . tamsulosin  0.4 mg Oral Daily  . vitamin B-12  500 mcg Oral Daily   Elmarie Shiley, MD 03/13/2015, 10:25 AM

## 2015-03-13 NOTE — Progress Notes (Signed)
[facility]  Pt with elevated potassium this morning.  Discussed with Dr Tat and Dr Posey Pronto.  They will try to drive potassium down this morning and try to attempt access procedure around noon today  Keep NPO Dr Posey Pronto wants [name] to hold off on catheter for now  Vein map looks like left basilic vein will be best option for fistula  [name] 03/13/2015 8:32 AM --  Laboratory Lab Results:  Recent Labs  03/11/15 0550 03/13/15 0459  WBC 13.1* 13.4*  HGB 8.4* 8.9*  HCT 26.1* 27.0*  PLT 313 310   BMET  Recent Labs  03/12/15 0734 03/13/15 0459  NA 137 139  K 5.3* 6.1*  CL 106 110  CO2 21* 21*  GLUCOSE 106* 90  BUN 66* 70*  CREATININE 5.18* 5.35*  CALCIUM 8.8* 9.0    COAG Lab Results  Component Value Date   INR 1.7* 09/11/2007   INR 1.5 09/10/2007   INR 1.1 09/09/2007   No results found for: PTT

## 2015-03-13 NOTE — Progress Notes (Signed)
CRITICAL VALUE ALERT  Critical value received:  K 6.1  Date of notification:  03/13/15  Time of notification:  0712  Critical value read back:Yes.    Nurse who received alert:  Beatrix Fetters  MD notified (1st page):  Dr. Carles Collet  Time of first page:  0715  Responding MD:  Dr. Carles Collet  Time MD responded:  [phone]

## 2015-03-13 NOTE — Anesthesia Procedure Notes (Signed)
Procedure Name: LMA Insertion Date/Time: 03/13/2015 2:32 PM Performed by: Clearnce Sorrel Pre-anesthesia Checklist: Patient identified, Timeout performed, Emergency Drugs available, Suction available and Patient being monitored Patient Re-evaluated:Patient Re-evaluated prior to inductionOxygen Delivery Method: Circle system utilized Preoxygenation: Pre-oxygenation with 100% oxygen Intubation Type: IV induction LMA: LMA inserted LMA Size: 5.0 Number of attempts: 1 Placement Confirmation: positive ETCO2 and breath sounds checked- equal and bilateral Tube secured with: Tape Dental Injury: Teeth and Oropharynx as per pre-operative assessment

## 2015-03-13 NOTE — Anesthesia Postprocedure Evaluation (Signed)
Anesthesia Post Note  Patient: [name]  Procedure(s) Performed: Procedure(s) (LRB): Left arm basilic vein transposition . (Left)  Patient location during evaluation: PACU Anesthesia Type: General Level of consciousness: awake and alert Pain management: pain level controlled Vital Signs Assessment: post-procedure vital signs reviewed and stable Respiratory status: spontaneous breathing, nonlabored ventilation, respiratory function stable and patient connected to nasal cannula oxygen Cardiovascular status: blood pressure returned to baseline and stable Postop Assessment: no signs of nausea or vomiting Anesthetic complications: no    Last Vitals:  Filed Vitals:   03/13/15 1750 03/13/15 1754  BP:    Pulse: 77 74  Temp:    Resp: 24 20    Last Pain:  Filed Vitals:   03/13/15 1754  PainSc: 0-No pain                 Takuya Lariccia,W. EDMOND

## 2015-03-13 NOTE — Op Note (Signed)
Procedure: Left basilic vein transposition fistula  Preoperative diagnosis: End-stage renal disease  Postoperative diagnosis: Same  Anesthesia: Gen.  Assistant: Gaye Alken, RNFA, Silva Bandy PA-c, Kayla Checkovich PA-student  Operative findings: 3-5 mm left basilic vein, 3 mm left brachial artery  Operative details: After obtaining informed consent, the patient was taken to the operating room. The patient was placed in supine position operating table. After induction of general anesthesia, the patient's entire left upper extremity was prepped and draped in the usual sterile fashion. Next ultrasound was used to identify the left basilic vein. A longitudinal incision was made just above the antecubital crease in order to expose the basilic vein. The vein was of good quality approximately 3-5 mm in diameter throughout its course. Several longitudinal skip incisions were made up the arm from just below the antecubital crease all the way up to the axilla to harvest the basilic vein. Care was taken to try to not injure any sensory nerves and all the motor nerves were identified and protected. The vein was dissected free circumferentially and small side branches ligated and divided between silk ties or clips. Next the brachial artery was exposed by deepening the basilic vein harvest incision just above the antecubital crease. The artery was approximately 3 mm in diameter. This was dissected free circumferentially. Vessel loops were placed around it. There was some spasm within the artery after dissecting it free. Next the distal basilic vein was ligated with a 2 silk tie and the vein transected. The vein was brought out throughout the skip incisions and gently distended with heparinized saline and marked for orientation. The vein was then tunneled subcutaneously in an arcing configuration out over the biceps muscle down to the level of the exposed brachial artery just above the antecubital crease. The patient  was given 5000 units of intravenous heparin. Vessel loops were used to control the artery proximally and distally. The vein was cut to length and sewn end of vein to side of artery using a running 7-0 Prolene suture. Just prior completion of the anastomosis, it was forebled backbled and thoroughly flushed. The anastomosis was secured Vesseloops were released.   Initially the flow was fairly sluggish due to spasm. However there was a palpable thrill after a few minutes of the proximal aspect of the fistula. The distal vein was also noted to fill fully. At this point all subcutaneous tissues were reapproximated using running 3-0 Vicryl suture. All skin incisions were closed with running 4  0 Vicryl subcuticular stitch. Dermabond was applied to all incisions. The patient tolerated the procedure well and there were no complications. Instrument sponge needle counts were correct at the end of the case. The patient was taken to the recovery room in stable condition.  [name], MD [facility] Office: [phone] Pager: [phone]

## 2015-03-13 NOTE — Anesthesia Preprocedure Evaluation (Addendum)
Anesthesia Evaluation  Patient identified by MRN, date of birth, ID band Patient awake    Reviewed: Allergy & Precautions, H&P , NPO status , Patient's Chart, lab work & pertinent test results  Airway Mallampati: III  TM Distance: >3 FB Neck ROM: Full    Dental no notable dental hx. (+) Edentulous Upper, Partial Lower, Dental Advisory Given   Pulmonary shortness of breath and with exertion, COPD,  COPD inhaler, Current Smoker,    Pulmonary exam normal breath sounds clear to auscultation       Cardiovascular hypertension, Pt. on medications  Rhythm:Regular Rate:Normal     Neuro/Psych negative neurological ROS  negative psych ROS   GI/Hepatic negative GI ROS, Neg liver ROS,   Endo/Other  diabetes, Type 2, Insulin Dependent, Oral Hypoglycemic Agents  Renal/GU Renal InsufficiencyRenal disease  negative genitourinary   Musculoskeletal   Abdominal   Peds  Hematology negative hematology ROS (+)   Anesthesia Other Findings   Reproductive/Obstetrics negative OB ROS                           Anesthesia Physical Anesthesia Plan  ASA: III  Anesthesia Plan: General   Post-op Pain Management:    Induction: Intravenous  Airway Management Planned: LMA  Additional Equipment:   Intra-op Plan:   Post-operative Plan: Extubation in OR  Informed Consent: I have reviewed the patients History and Physical, chart, labs and discussed the procedure including the risks, benefits and alternatives for the proposed anesthesia with the patient or authorized representative who has indicated his/her understanding and acceptance.   Dental advisory given  Plan Discussed with: Anesthesiologist and CRNA  Anesthesia Plan Comments:       Anesthesia Quick Evaluation

## 2015-03-13 NOTE — Progress Notes (Signed)
PROGRESS NOTE  [name] [id number] DOB: 02/03/1951 DOA: 03/08/2015 PCP: [place]  66 yo M with DM, HLD, prior partial colectomy for benign tumor, and prostate cancer recently finished XRT admitted 1/27 with abdominal pain and acute urinary retention.   Interim Summary Patient was admitted on 1/27 with post-obstructive renal failure in the setting of BPH as well as prostate cancer status post recent radiation therapy at the [facility]. Foley catheter was placed with improvement on his urine output and decompression of his bladder, however with essentially no improvement in his renal function. Nephrology was consulted at that time. Vascular surgery also consulted, patient is to undergo vein mapping and left arm AV access tomorrow. He also had COPD exacerbation on admission, and this is improving.  Subjective / 24 H Interval events - States his breathing is continuing to improve but still has intermittent SOB with ambulation - Denies  fevers, chills, chest pain, shortness breath, nausea, vomiting, diarrhea, abdominal pain, dysuria. Denies any headache or neck pain.   Acute on chronic renal failure (HCC) on CKD III - Most likely secondary to urinary retention and underlying chronic renal disease - Foley placed in the ED with good UOP, Cr remains elevated however - Continue to hold ACEI - Nephrology consulted, appreciate input - Vein mapping and VVS consult 1/30, pending results,  -03/12/15--AV graft surgery--Dr. Oneida Alar -03/12/15--case discussed with Dr. Graylon Gunning, Dr. Oneida Alar  Anemia due to CKD, folate and B12 deficiency - Multifactorial in the setting of CKD, folate and B12 are low as well - Continue folic acid and [id number] - One dose Feraheme 1/30 and will start ESA   Urinary retention Due to BPH/prostate cancer status post XRT - Continue use of Foley, good UOP,  -will need it on discharge and outpatient follow-up with urology - Patient wants to establish with  urology here in [place], will give information about local group on discharge  Pericardial effusion  - CT scan has a propensity to overestimate pericardial effusion but given dyspnea and low voltage EKG, obtained 2D echocardiogram which shows normal EF ([id number] grade II diastolic dysfunction and no evidence of significant pericardial effusion   COPD (chronic obstructive pulmonary disease) Exacerbation (HCC)  - Continue Doxycycline and Prednisone - Continue scheduled duonebs  - Wheezing still present however markedly improved today - Mild leukocytosis likely steroid induced - Start tapering the prednisone tomorrow   Tobacco abuse  - Counseled again today - Has stated that nicotine patches work and he has no cravings  Essential hypertension  - Hold his ACE inhibitor, continue amlodipine - BP today at 145/71, continue current regimen   Renal lesion  - Once renal function improves will need MRI per renal protocol, however this can be done in outpatient setting  DM2 - CBGs 130-190s, continue current regimen  - CBG 106 on 1/31  -03/09/2015 hemoglobin A1c 6.2 -Hold glipizide -Will need dose adjustment of glipizide due to worsening renal function   Diet: Diet Carb Modified Fluid consistency:: Thin; Room service appropriate?: Yes Diet NPO time specified Except for: Sips with Meds Fluids: None DVT Prophylaxis: Heparin  Code Status: DNR Family Communication: None at bedside Disposition Plan: Remain inpatient, AV access planned for 02/01 Barriers to discharge: COPD exacerbation, renal failure  Consultants:  Nephrology  VVS  Procedures:  2D echo Study Conclusions - Left ventricle: The cavity size was normal. Wall thickness wasincreased in a pattern of mild LVH. Systolic function was  normal.The estimated ejection fraction was in the range of 60% to 65%.Features are consistent with a pseudonormal left ventricularfilling pattern, with concomitant abnormal  relaxation andincreased filling pressure (grade 2 diastolic dysfunction).   Antibiotics Doxycycline started 1/28 >>   Studies   Imaging Results (Last 48 hours)            Procedures/Studies: Ct Abdomen Pelvis Wo Contrast  03/08/2015  CLINICAL DATA:  Abdominal pain and bloating. Bladder leakage. Undergoing radiation therapy for colon carcinoma. EXAM: CT ABDOMEN AND PELVIS WITHOUT CONTRAST TECHNIQUE: Multidetector CT imaging of the abdomen and pelvis was performed following the standard protocol without IV contrast. COMPARISON:  None. FINDINGS: Lower chest:  Normal heart size.  Small pericardial effusion noted. Hepatobiliary: No mass visualized on this un-enhanced exam. Gallbladder is unremarkable. Will Pancreas: No mass or inflammatory process identified on this un-enhanced exam. Spleen: Within normal limits in size. Adrenals/Urinary Tract: A 3 cm lesion is seen in the lower pole of the right kidney and a 2 cm lesion is seen in the upper pole the left kidney which have higher than fluid attenuation. These could represent a hemorrhagic cysts or solid renal masses. Other low-attenuation lesions cannot be characterized but likely represent small renal cysts. No evidence of ureteral calculi or hydronephrosis. The urinary bladder is markedly distended, but otherwise unremarkable in appearance on this noncontrast study. Stomach/Bowel: No evidence of obstruction, inflammatory process, or abnormal fluid collections. Surgical clips seen from previous partial right colectomy. Surgical mesh seen in anterior abdominal wall, without evidence of recurrent hernia. Vascular/Lymphatic: No pathologically enlarged lymph nodes. No evidence of abdominal aortic aneurysm. Reproductive: No mass or other significant abnormality. Other: Bilateral hip prostheses result in significant beam hardening artifact through the inferior pelvis. Musculoskeletal:  No suspicious bone lesions identified. IMPRESSION: No evidence of  urolithiasis or hydronephrosis. Markedly distended urinary bladder ; recommend clinical correlation for urinary retention. Indeterminate lesions in lower pole of right kidney in upper pole of left kidney, which could represent hemorrhagic cysts or solid renal masses. Nonemergent outpatient abdomen MRI without and with contrast recommended for further characterization. Small pericardial effusion incidentally noted. Electronically Signed   By: Earle Gell M.D.   On: 03/08/2015 22:04   Dg Chest 2 View  03/08/2015  CLINICAL DATA:  Shortness of breath and congestion for 3 days. History of hypertension, diabetes, smoker. EXAM: CHEST  2 VIEW COMPARISON:  None. FINDINGS: Mild hyperinflation. Normal heart size and pulmonary vascularity. No focal airspace disease or consolidation in the lungs. No blunting of costophrenic angles. No pneumothorax. Degenerative changes in the spine. Mild thoracic scoliosis convex towards the right. IMPRESSION: No active cardiopulmonary disease. Electronically Signed   By: Lucienne Capers M.D.   On: 03/08/2015 22:47         Subjective: Patient states that his breathing is improved. Denies fevers, chills, chest pain, nausea, vomiting, diarrhea, abdominal pain.  Objective: Filed Vitals:   03/12/15 2124 03/13/15 0526 03/13/15 0813 03/13/15 0900  BP: 168/77 154/86  116/66  Pulse: 88 79  72  Temp: 98.1 F (36.7 C) 98.5 F (36.9 C)  98 F (36.7 C)  TempSrc: Oral Oral  Oral  Resp: 19 17  18   Height:      Weight: 98.4 kg (216 lb 14.9 oz)     SpO2: 95% 94% 91% 95%    Intake/Output Summary (Last 24 hours) at 03/13/15 1449 Last data filed at 03/13/15 0900  Gross per 24 hour  Intake    720 ml  Output  2000 ml  Net  -1280 ml   Weight change:  Exam:   General:  Pt is alert, follows commands appropriately, not in acute distress  HEENT: No icterus, No thrush, No neck mass, [place]/AT  Cardiovascular: RRR, S1/S2, no rubs, no gallops  Respiratory: Bibasilar wheezing.  Good air movement  Abdomen: Soft/+BS, non tender, non distended, no guarding  Extremities: trace LE edema, No lymphangitis, No petechiae, No rashes, no synovitis  Data Reviewed: Basic Metabolic Panel:  Recent Labs Lab 03/09/15 0442 03/10/15 0507 03/11/15 0550 03/12/15 0734 03/13/15 0459 03/13/15 1220  NA 142 133* 136 137 139 140  K 4.7 5.7* 5.1 5.3* 6.1* 5.6*  CL 108 106 107 106 110 107  CO2 23 20* 21* 21* 21* 22  GLUCOSE 122* 246* 135* 106* 90 125*  BUN 37* 56* 60* 66* 70* 68*  CREATININE 5.07* 5.18* 5.10* 5.18* 5.35* 5.46*  CALCIUM 8.7* 8.5* 8.8* 8.8* 9.0 9.1  MG 1.6*  --   --   --   --   --   PHOS 4.0  --   --  4.4  --   --    Liver Function Tests:  Recent Labs Lab 03/09/15 0442 03/12/15 0734  AST 17  --   ALT 13*  --   ALKPHOS 50  --   BILITOT 0.4  --   PROT 6.0*  --   ALBUMIN 2.7* 2.5*    Recent Labs Lab 03/09/15 0442  LIPASE 54*   No results for input(s): AMMONIA in the last 168 hours. CBC:  Recent Labs Lab 03/08/15 2022 03/09/15 0442 03/11/15 0550 03/13/15 0459  WBC 8.1 8.2 13.1* 13.4*  NEUTROABS 5.8  --   --   --   HGB 9.2* 8.5* 8.4* 8.9*  HCT 29.6* 27.6* 26.1* 27.0*  MCV 100.3* 100.0 98.9 97.8  PLT 304 313 313 310   Cardiac Enzymes: No results for input(s): CKTOTAL, CKMB, CKMBINDEX, TROPONINI in the last 168 hours. BNP: Invalid input(s): POCBNP CBG:  Recent Labs Lab 03/12/15 1639 03/12/15 2123 03/13/15 0734 03/13/15 1144 03/13/15 1321  GLUCAP 256* 165* 97 115* 158*    Recent Results (from the past 240 hour(s))  Urine culture     Status: None   Collection Time: 03/08/15  4:42 PM  Result Value Ref Range Status   Specimen Description URINE, CLEAN CATCH  Final   Special Requests NONE  Final   Culture 8,000 COLONIES/mL INSIGNIFICANT GROWTH  Final   Report Status 03/10/2015 FINAL  Final  Surgical pcr screen     Status: Abnormal   Collection Time: 03/13/15  5:45 AM  Result Value Ref Range Status   MRSA, PCR POSITIVE (A)  NEGATIVE Final    Comment: RESULT CALLED TO, READ BACK BY AND VERIFIED WITH: J. SANDERS RN 10:55 03/13/15 ([email or web address])    Staphylococcus aureus POSITIVE (A) NEGATIVE Final    Comment:        The Xpert SA Assay (FDA approved for NASAL specimens in patients over 61 years of age), is one component of a comprehensive surveillance program.  Test performance has been validated by [facility] for patients greater than or equal to 55 year old. It is not intended to diagnose infection nor to guide or monitor treatment.      Scheduled Meds: . albuterol  2.5 mg Nebulization Once  . [MAR Hold] amLODipine  10 mg Oral Daily  . [MAR Hold] atorvastatin  10 mg Oral Daily  . [MAR Hold] calcium acetate  1,334 mg  Oral TID WC  . calcium gluconate  1 g Intravenous Once  . cefUROXime (ZINACEF)  IV  1.5 g Intravenous To SS-Surg  . [MAR Hold] Chlorhexidine Gluconate Cloth  6 each Topical Q0600  . [MAR Hold] darbepoetin (ARANESP) injection - NON-DIALYSIS  100 mcg Subcutaneous Q Tue-1800  . [MAR Hold] doxycycline  100 mg Oral Q12H  . [MAR Hold] folic acid  1 mg Oral Daily  . [MAR Hold] furosemide  20 mg Oral Daily  . [MAR Hold] guaiFENesin  600 mg Oral BID  . [MAR Hold] heparin  5,000 Units Subcutaneous 3 times per day  . [MAR Hold] insulin aspart  0-15 Units Subcutaneous TID WC  . [MAR Hold] insulin aspart  0-5 Units Subcutaneous QHS  . [MAR Hold] ipratropium-albuterol  3 mL Nebulization QID  . [MAR Hold] mupirocin ointment  1 application Nasal BID  . [MAR Hold] nicotine  21 mg Transdermal Daily  . [MAR Hold] predniSONE  30 mg Oral Q breakfast  . [MAR Hold] sodium bicarbonate  1,300 mg Oral BID  . [MAR Hold] sodium chloride flush  3 mL Intravenous Q12H  . [MAR Hold] tamsulosin  0.4 mg Oral Daily  . [MAR Hold] vitamin B-12  500 mcg Oral Daily   Continuous Infusions: . sodium chloride       Jilliam Bellmore, DO  [facility] Pager [phone]  If 7PM-7AM, please contact  night-coverage [email or web address] Password [email or web address] 03/13/2015, 2:49 PM   LOS: 4 days

## 2015-03-13 NOTE — Interval H&P Note (Signed)
History and Physical Interval Note:  03/13/2015 1:58 PM  [name]  has presented today for surgery, with the diagnosis of Stage IV Chronic Kidney Disease N18.4  The various methods of treatment have been discussed with the patient and family. After consideration of risks, benefits and other options for treatment, the patient has consented to  Procedure(s): ARTERIOVENOUS (AV) FISTULA CREATION (Left) as a surgical intervention .  The patient's history has been reviewed, patient examined, no change in status, stable for surgery.  I have reviewed the patient's chart and labs.  Questions were answered to the patient's satisfaction.     [name]

## 2015-03-14 ENCOUNTER — Encounter (HOSPITAL_COMMUNITY): Payer: Self-pay | Admitting: Vascular Surgery

## 2015-03-14 ENCOUNTER — Telehealth: Payer: Self-pay | Admitting: Vascular Surgery

## 2015-03-14 DIAGNOSIS — J441 Chronic obstructive pulmonary disease with (acute) exacerbation: Secondary | ICD-10-CM

## 2015-03-14 DIAGNOSIS — J9601 Acute respiratory failure with hypoxia: Secondary | ICD-10-CM

## 2015-03-14 DIAGNOSIS — J438 Other emphysema: Secondary | ICD-10-CM

## 2015-03-14 LAB — BASIC METABOLIC PANEL
Anion gap: 12 (ref 5–15)
BUN: 72 mg/dL — ABNORMAL HIGH (ref 6–20)
CHLORIDE: 102 mmol/L (ref 101–111)
CO2: 24 mmol/L (ref 22–32)
CREATININE: 5.53 mg/dL — AB (ref 0.61–1.24)
Calcium: 8.7 mg/dL — ABNORMAL LOW (ref 8.9–10.3)
GFR calc non Af Amer: 10 mL/min — ABNORMAL LOW (ref >60)
GFR, EST AFRICAN AMERICAN: 11 mL/min — AB (ref >60)
Glucose, Bld: 121 mg/dL — ABNORMAL HIGH (ref 65–99)
POTASSIUM: 5.6 mmol/L — AB (ref 3.5–5.1)
SODIUM: 138 mmol/L (ref 135–145)

## 2015-03-14 LAB — GLUCOSE, CAPILLARY
GLUCOSE-CAPILLARY: 123 mg/dL — AB (ref 65–99)
GLUCOSE-CAPILLARY: 140 mg/dL — AB (ref 65–99)
Glucose-Capillary: 189 mg/dL — ABNORMAL HIGH (ref 65–99)

## 2015-03-14 MED ORDER — GLIPIZIDE 10 MG PO TABS: 10.0000 mg | ORAL_TABLET | Freq: Every day | ORAL | Status: DC

## 2015-03-14 MED ORDER — IPRATROPIUM-ALBUTEROL 0.5-2.5 (3) MG/3ML IN SOLN
3.0000 mL | Freq: Four times a day (QID) | RESPIRATORY_TRACT | Status: DC
  Administered 2015-03-14: 3 mL via RESPIRATORY_TRACT

## 2015-03-14 MED ORDER — FOLIC ACID 1 MG PO TABS: 1.0000 mg | ORAL_TABLET | Freq: Every day | ORAL | Status: DC

## 2015-03-14 MED ORDER — DOXYCYCLINE HYCLATE 100 MG PO TABS: 100.0000 mg | ORAL_TABLET | Freq: Two times a day (BID) | ORAL | Status: DC

## 2015-03-14 MED ORDER — CYANOCOBALAMIN 500 MCG PO TABS: 500.0000 ug | ORAL_TABLET | Freq: Every day | ORAL | Status: DC

## 2015-03-14 MED ORDER — SODIUM POLYSTYRENE SULFONATE 15 GM/60ML PO SUSP
45.0000 g | Freq: Once | ORAL | Status: AC
  Administered 2015-03-14: 45 g via ORAL
  Filled 2015-03-14: qty 180

## 2015-03-14 MED ORDER — FUROSEMIDE 20 MG PO TABS: 20.0000 mg | ORAL_TABLET | Freq: Every day | ORAL | Status: DC

## 2015-03-14 MED ORDER — PREDNISONE 10 MG PO TABS: 30.0000 mg | ORAL_TABLET | Freq: Every day | ORAL | Status: DC

## 2015-03-14 NOTE — Telephone Encounter (Signed)
-----   Message from Mena Goes, RN sent at 03/13/2015  5:13 PM EST ----- Regarding: schedule   ----- Message -----    From: Alvia Grove, PA-C    Sent: 03/13/2015   4:43 PM      To: Vvs Charge Pool  S/p left BVT 03/13/15  F/u with Dr. Oneida Alar in 6 weeks with duplex  Thanks Maudie Mercury

## 2015-03-14 NOTE — Progress Notes (Signed)
[name] to be D/C'd Home per MD order.  Discussed prescriptions and follow up appointments with the patient. Prescriptions given to patient, medication list explained in detail. Pt verbalized understanding.    Medication List    STOP taking these medications        bacitracin ointment     diphenhydrAMINE 25 MG tablet  Commonly known as:  BENADRYL     lisinopril 40 MG tablet  Commonly known as:  PRINIVIL,ZESTRIL     mineral oil-hydrophilic petrolatum ointment     minocycline 100 MG capsule  Commonly known as:  MINOCIN,DYNACIN     phenazopyridine 100 MG tablet  Commonly known as:  PYRIDIUM     triamcinolone cream 0.1 %  Commonly known as:  KENALOG      TAKE these medications        amLODipine 10 MG tablet  Commonly known as:  NORVASC  Take 10 mg by mouth daily.     atorvastatin 20 MG tablet  Commonly known as:  LIPITOR  Take 10 mg by mouth daily.     calcium acetate 667 MG capsule  Commonly known as:  PHOSLO  Take 1,334 mg by mouth 3 (three) times daily with meals.     cyanocobalamin 500 MCG tablet  Take 1 tablet (500 mcg total) by mouth daily.     doxycycline 100 MG tablet  Commonly known as:  VIBRA-TABS  Take 1 tablet (100 mg total) by mouth every 12 (twelve) hours.     folic acid 1 MG tablet  Commonly known as:  FOLVITE  Take 1 tablet (1 mg total) by mouth daily.     furosemide 20 MG tablet  Commonly known as:  LASIX  Take 1 tablet (20 mg total) by mouth daily.     glipiZIDE 10 MG tablet  Commonly known as:  GLUCOTROL  Take 1 tablet (10 mg total) by mouth daily before breakfast.     predniSONE 10 MG tablet  Commonly known as:  DELTASONE  Take 3 tablets (30 mg total) by mouth daily with breakfast. And decrease by one tablet daily  Start taking on:  03/15/2015     sodium bicarbonate 650 MG tablet  Take 1,300 mg by mouth 2 (two) times daily.     tamsulosin 0.4 MG Caps capsule  Commonly known as:  FLOMAX  Take 0.4 mg by mouth daily.     Vitamin  D-3 1000 units Caps  Take 1 capsule by mouth 2 (two) times daily.        Filed Vitals:   03/14/15 1506 03/14/15 1648  BP:  146/74  Pulse: 85 89  Temp:  98.4 F (36.9 C)  Resp: 18 17    Skin clean, dry and intact without evidence of skin break down, no evidence of skin tears noted. IV catheter discontinued intact. Site without signs and symptoms of complications. Dressing and pressure applied. Pt denies pain at this time. No complaints noted.  An After Visit Summary was printed and given to the patient. Patient will be escorted via WC, and D/C home via private auto once patient's daughter arrives.   [name] 03/14/2015 6:35 PM

## 2015-03-14 NOTE — Progress Notes (Addendum)
     Patient doing well palpable thrill left BVT .  Incisions intact and palpable radial pulse.  Do not stick fistula for 12 weeks    S/P Left basilic vein transposition fistula  [name], Paul MAUREEN PA-C  Agree with above.  Easily palpable thrill.  No steal symptoms.  Will sign off  Follow up with me 4-6 weeks with fistula [name]  Paul Greek, MD [facility]: [phone] Pager: [phone]

## 2015-03-14 NOTE — Progress Notes (Signed)
SATURATION QUALIFICATIONS:   Patient Saturations on Room Air at Rest = 94% Patient Saturations on Room Air while Ambulating = 85%-92%

## 2015-03-14 NOTE — Progress Notes (Signed)
Patient ID: [name], male   DOB: 1950/02/29, 65 y.o.   MRN: [id number]  [facility] Progress Note   Assessment/ Plan:   1. Acute renal failure on chronic kidney disease stage IV: From BOO and hemodynamically mediated acute renal failure (previously on diuretics/ACE inhibitor)-appears to have established a new renal baseline and now status post left brachiobasilic fistula. No acute dialysis needs at this time. Would treat his hyperkalemia with a dose of Kayexalate and consider discharge later today to be followed as an outpatient by myself in the renal clinic. 2.  Prostate cancer status post radiation therapy: With urinary retention and status post Foley catheter placement with good urine output. To establish himself with urology locally for follow-up.  3. Hypertension: Blood pressures remained fairly controlled off of ACE inhibitors-continue to monitor for adjustment of diuretic therapy.  4. 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 5. Metabolic bone disease: Phosphorus levels acceptable at 4.4 on calcium acetate 3 times a day before meals. 6. Hyperkalemia: Mild, treated with a single dose of Kayexalate.   Subjective:   Reports to be feeling well-denies any obvious problems overnight. Discomfort over left upper arm.     Objective:   BP 135/70 mmHg  Pulse 85  Temp(Src) 98.1 F (36.7 C) (Oral)  Resp 18  Ht 5' 11.5" (1.816 m)  Wt 98.4 kg (216 lb 14.9 oz)  BMI 29.84 kg/m2  SpO2 94%  Intake/Output Summary (Last 24 hours) at 03/14/15 0955 Last data filed at 03/14/15 0320  Gross per 24 hour  Intake    360 ml  Output    615 ml  Net   -255 ml   Weight change:   Physical Exam: Gen: Comfortably resting in bed CVS: Pulse regular, normal rate, S1 and S2 without murmur or rub Resp: Decreased breath sounds over bases otherwise clear to auscultation Abd: Soft, obese,  nontender, bowel sounds normal Ext: No lower extremity edema noted. Left brachiobasilic fistula with palpable thrill. Surgical wounds intact.   Imaging: No results found.  Labs: BMET  Recent Labs Lab 03/09/15 0442 03/10/15 0507 03/11/15 0550 03/12/15 0734 03/13/15 0459 03/13/15 1220 03/14/15 0750  NA 142 133* 136 137 139 140 138  K 4.7 5.7* 5.1 5.3* 6.1* 5.6* 5.6*  CL 108 106 107 106 110 107 102  CO2 23 20* 21* 21* 21* 22 24  GLUCOSE 122* 246* 135* 106* 90 125* 121*  BUN 37* 56* 60* 66* 70* 68* 72*  CREATININE 5.07* 5.18* 5.10* 5.18* 5.35* 5.46* 5.53*  CALCIUM 8.7* 8.5* 8.8* 8.8* 9.0 9.1 8.7*  PHOS 4.0  --   --  4.4  --   --   --    CBC  Recent Labs Lab 03/08/15 2022 03/09/15 0442 03/11/15 0550 03/13/15 0459  WBC 8.1 8.2 13.1* 13.4*  NEUTROABS 5.8  --   --   --   HGB 9.2* 8.5* 8.4* 8.9*  HCT 29.6* 27.6* 26.1* 27.0*  MCV 100.3* 100.0 98.9 97.8  PLT 304 313 313 310   Medications:    . albuterol  2.5 mg Nebulization Once  . amLODipine  10 mg Oral Daily  . atorvastatin  10 mg Oral Daily  . calcium acetate  1,334 mg Oral TID WC  . Chlorhexidine Gluconate Cloth  6 each Topical Q0600  . darbepoetin (ARANESP) injection - NON-DIALYSIS  100 mcg Subcutaneous Q Tue-1800  . doxycycline  100 mg Oral Q12H  . folic acid  1 mg Oral Daily  . furosemide  20 mg Oral Daily  . guaiFENesin  600 mg Oral BID  . heparin  5,000 Units Subcutaneous 3 times per day  . insulin aspart  0-15 Units Subcutaneous TID WC  . insulin aspart  0-5 Units Subcutaneous QHS  . ipratropium-albuterol  3 mL Nebulization Q6H  . mupirocin ointment  1 application Nasal BID  . nicotine  21 mg Transdermal Daily  . predniSONE  30 mg Oral Q breakfast  . sodium bicarbonate  1,300 mg Oral BID  . sodium chloride flush  3 mL Intravenous Q12H  . sodium polystyrene  45 g Oral Once  . tamsulosin  0.4 mg Oral Daily  . vitamin B-12  500 mcg Oral Daily   Elmarie Shiley, MD 03/14/2015, 9:55 AM

## 2015-03-14 NOTE — Care Management Important Message (Signed)
Important Message  Patient Details  Name: SCHAEFER OSBURN MRN: [id number] Date of Birth: [birth year]   Medicare Important Message Given:  Yes    Jadie Comas, Rory Percy, RN 03/14/2015, 10:26 AM

## 2015-03-14 NOTE — Telephone Encounter (Signed)
LM for pt re appt, dpm °

## 2015-03-14 NOTE — Discharge Summary (Addendum)
Physician Discharge Summary  [name] [id number] DOB: [birth year] DOA: 03/08/2015  PCP: [place] date: 03/08/2015 Discharge date: 03/14/2015  Recommendations for Outpatient Follow-up:  1. Pt will need to follow up with PCP in 2 weeks post discharge 2. Please obtain BMP in one week Discharge Diagnoses:  Acute on chronic renal failure (HCC) on CKD III - Most likely secondary to urinary retention and underlying chronic renal disease - Foley placed in the ED with good UOP, Cr remains elevated however - Continue to hold ACEI - Nephrology consulted, appreciate input - Vein mapping and VVS consult 1/30, pending results,  -03/13/15--AV graft surgery--Dr. Oneida Alar -03/14/15--case discussed with Dr. Lenna Sciara. Patel-cleared pt for d/c with close outpt followup  Anemia due to CKD, folate and B12 deficiency - Multifactorial in the setting of CKD, folate and B12 are low as well - Continue folic acid and [id number] after d/c - One dose Feraheme 1/30  Urinary retention Due to BPH/prostate cancer status post XRT - Continue use of Foley, good UOP,  -maintain on discharge and outpatient follow-up with urology - Patient wants to establish with urology here in [place], will give information about local group on discharge -appt set up with [facility] for 03/21/15@845A   Pericardial effusion  - CT scan has a propensity to overestimate pericardial effusion but given dyspnea and low voltage EKG, obtained 2D echocardiogram which shows normal EF ([id number] grade II diastolic dysfunction and no evidence of significant pericardial effusion  Acute respiratory failure with hypoxia -Secondary to COPD exacerbation -Improved with doxycycline and prednisone -Stable on room air COPD (chronic obstructive pulmonary disease) Exacerbation (HCC)  - Continue Doxycycline and Prednisone -Home with prednisone taper - Continue scheduled duonebs  - Wheezing improved - Mild leukocytosis likely steroid  induced - Home with doxycycline 2 additional days (1 wk of tx)  Tobacco abuse  - Counseled again today - Has stated that nicotine patches work and he has no cravings  Essential hypertension  - Hold his ACE inhibitor, continue amlodipine -will not restart lisinopril in setting of hyperkalemia  Renal lesion  - Once renal function improves will need MRI per renal protocol, however this can be done in outpatient setting  DM2 - CBGs 130-190s, continue current regimen  - CBG 106 on 1/31  -03/09/2015 hemoglobin A1c 6.2 -Hold glipizide during hospitalization -Decreased of glipizide due to worsening renal function--follow up with PCP for continued adjustments  Discharge Condition: stable  Disposition: home Follow-up Information    Follow up with Ulla Potash., MD On 04/03/2015.   Specialty:  Nephrology   Why:  Your appointment is at 1:45 PM- You will be contacted by the office regarding labs to be drawn prior to your visit.   Contact information:   [address] [place] [address] [phone]       Follow up with [name], MD In 6 weeks.   Specialties:  Vascular Surgery, Cardiology   Why:  Our office will call you to arrange an appointment   Contact information:   [place] [address] [phone]       Follow up with Karen Kays, NP.   Specialty:  Nurse Practitioner   Why:  03/21/15@0845    Contact information:   [address] [place] [address] [phone]       Diet:renal, low potassium Wt Readings from Last 3 Encounters:  03/12/15 98.4 kg (216 lb 14.9 oz)  12/05/13 95.255 kg (210 lb)  11/06/09 98.204 kg (  216 lb 8 oz)    History of present illness:  Patient was admitted on 1/27 with post-obstructive renal failure in the setting of BPH as well as prostate cancer status post recent radiation therapy at the [facility]. Foley catheter was placed with improvement on his urine output and decompression of his bladder, however  with essentially no improvement in his renal function. Nephrology was consulted at that time. Vascular surgery also consulted, patient is to undergo vein mapping and left arm AV access tomorrow. He also had COPD exacerbation on admission, and this is improving with prednisone and doxycycline. The patient will be discharged with prednisone taper and doxycycline for 2 additional days.    Consultants: nephrololgy VVS  Discharge Exam: Filed Vitals:   03/14/15 0758 03/14/15 0923  BP: 135/70   Pulse: 73 85  Temp: 98.1 F (36.7 C)   Resp: 17 18   Filed Vitals:   03/13/15 2152 03/14/15 0628 03/14/15 0758 03/14/15 0923  BP:  148/68 135/70   Pulse:  72 73 85  Temp:  97.9 F (36.6 C) 98.1 F (36.7 C)   TempSrc:  Oral Oral   Resp:  16 17 18   Height:      Weight:      SpO2: 95% 94% 92% 94%   General: A&O x 3, NAD, pleasant, cooperative Cardiovascular: RRR, no rub, no gallop, no S3 Respiratory: Bibasilar rales. Minimal bibasilar wheeze. Good air movement. Abdomen:soft, nontender, nondistended, positive bowel sounds Extremities: No edema, No lymphangitis, no petechiae  Discharge Instructions      Discharge Instructions    Increase activity slowly    Complete by:  As directed             Medication List    STOP taking these medications        bacitracin ointment     diphenhydrAMINE 25 MG tablet  Commonly known as:  BENADRYL     lisinopril 40 MG tablet  Commonly known as:  PRINIVIL,ZESTRIL     mineral oil-hydrophilic petrolatum ointment     minocycline 100 MG capsule  Commonly known as:  MINOCIN,DYNACIN     phenazopyridine 100 MG tablet  Commonly known as:  PYRIDIUM     triamcinolone cream 0.1 %  Commonly known as:  KENALOG      TAKE these medications        amLODipine 10 MG tablet  Commonly known as:  NORVASC  Take 10 mg by mouth daily.     atorvastatin 20 MG tablet  Commonly known as:  LIPITOR  Take 10 mg by mouth daily.     calcium acetate 667 MG  capsule  Commonly known as:  PHOSLO  Take 1,334 mg by mouth 3 (three) times daily with meals.     cyanocobalamin 500 MCG tablet  Take 1 tablet (500 mcg total) by mouth daily.     doxycycline 100 MG tablet  Commonly known as:  VIBRA-TABS  Take 1 tablet (100 mg total) by mouth every 12 (twelve) hours.     folic acid 1 MG tablet  Commonly known as:  FOLVITE  Take 1 tablet (1 mg total) by mouth daily.     furosemide 20 MG tablet  Commonly known as:  LASIX  Take 1 tablet (20 mg total) by mouth daily.     glipiZIDE 10 MG tablet  Commonly known as:  GLUCOTROL  Take 1 tablet (10 mg total) by mouth daily before breakfast.     predniSONE 10 MG tablet  Commonly known as:  DELTASONE  Take 3 tablets (30 mg total) by mouth daily with breakfast. And decrease by one tablet daily  Start taking on:  03/15/2015     sodium bicarbonate 650 MG tablet  Take 1,300 mg by mouth 2 (two) times daily.     tamsulosin 0.4 MG Caps capsule  Commonly known as:  FLOMAX  Take 0.4 mg by mouth daily.     Vitamin D-3 1000 units Caps  Take 1 capsule by mouth 2 (two) times daily.         The results of significant diagnostics from this hospitalization (including imaging, microbiology, ancillary and laboratory) are listed below for reference.    Significant Diagnostic Studies: Ct Abdomen Pelvis Wo Contrast  03/08/2015  CLINICAL DATA:  Abdominal pain and bloating. Bladder leakage. Undergoing radiation therapy for colon carcinoma. EXAM: CT ABDOMEN AND PELVIS WITHOUT CONTRAST TECHNIQUE: Multidetector CT imaging of the abdomen and pelvis was performed following the standard protocol without IV contrast. COMPARISON:  None. FINDINGS: Lower chest:  Normal heart size.  Small pericardial effusion noted. Hepatobiliary: No mass visualized on this un-enhanced exam. Gallbladder is unremarkable. Will Pancreas: No mass or inflammatory process identified on this un-enhanced exam. Spleen: Within normal limits in size.  Adrenals/Urinary Tract: A 3 cm lesion is seen in the lower pole of the right kidney and a 2 cm lesion is seen in the upper pole the left kidney which have higher than fluid attenuation. These could represent a hemorrhagic cysts or solid renal masses. Other low-attenuation lesions cannot be characterized but likely represent small renal cysts. No evidence of ureteral calculi or hydronephrosis. The urinary bladder is markedly distended, but otherwise unremarkable in appearance on this noncontrast study. Stomach/Bowel: No evidence of obstruction, inflammatory process, or abnormal fluid collections. Surgical clips seen from previous partial right colectomy. Surgical mesh seen in anterior abdominal wall, without evidence of recurrent hernia. Vascular/Lymphatic: No pathologically enlarged lymph nodes. No evidence of abdominal aortic aneurysm. Reproductive: No mass or other significant abnormality. Other: Bilateral hip prostheses result in significant beam hardening artifact through the inferior pelvis. Musculoskeletal:  No suspicious bone lesions identified. IMPRESSION: No evidence of urolithiasis or hydronephrosis. Markedly distended urinary bladder ; recommend clinical correlation for urinary retention. Indeterminate lesions in lower pole of right kidney in upper pole of left kidney, which could represent hemorrhagic cysts or solid renal masses. Nonemergent outpatient abdomen MRI without and with contrast recommended for further characterization. Small pericardial effusion incidentally noted. Electronically Signed   By: Earle Gell M.D.   On: 03/08/2015 22:04   Dg Chest 2 View  03/08/2015  CLINICAL DATA:  Shortness of breath and congestion for 3 days. History of hypertension, diabetes, smoker. EXAM: CHEST  2 VIEW COMPARISON:  None. FINDINGS: Mild hyperinflation. Normal heart size and pulmonary vascularity. No focal airspace disease or consolidation in the lungs. No blunting of costophrenic angles. No pneumothorax.  Degenerative changes in the spine. Mild thoracic scoliosis convex towards the right. IMPRESSION: No active cardiopulmonary disease. Electronically Signed   By: Lucienne Capers M.D.   On: 03/08/2015 22:47     Microbiology: Recent Results (from the past 240 hour(s))  Urine culture     Status: None   Collection Time: 03/08/15  4:42 PM  Result Value Ref Range Status   Specimen Description URINE, CLEAN CATCH  Final   Special Requests NONE  Final   Culture 8,000 COLONIES/mL INSIGNIFICANT GROWTH  Final   Report Status 03/10/2015 FINAL  Final  Surgical pcr screen  Status: Abnormal   Collection Time: 03/13/15  5:45 AM  Result Value Ref Range Status   MRSA, PCR POSITIVE (A) NEGATIVE Final    Comment: RESULT CALLED TO, READ BACK BY AND VERIFIED WITH: J. SANDERS RN 10:55 03/13/15 ([email or web address])    Staphylococcus aureus POSITIVE (A) NEGATIVE Final    Comment:        The Xpert SA Assay (FDA approved for NASAL specimens in patients over 34 years of age), is one component of a comprehensive surveillance program.  Test performance has been validated by [facility] for patients greater than or equal to 23 year old. It is not intended to diagnose infection nor to guide or monitor treatment.      Labs: Basic Metabolic Panel:  Recent Labs Lab 03/09/15 0442  03/11/15 0550 03/12/15 0734 03/13/15 0459 03/13/15 1220 03/14/15 0750  NA 142  < > 136 137 139 140 138  K 4.7  < > 5.1 5.3* 6.1* 5.6* 5.6*  CL 108  < > 107 106 110 107 102  CO2 23  < > 21* 21* 21* 22 24  GLUCOSE 122*  < > 135* 106* 90 125* 121*  BUN 37*  < > 60* 66* 70* 68* 72*  CREATININE 5.07*  < > 5.10* 5.18* 5.35* 5.46* 5.53*  CALCIUM 8.7*  < > 8.8* 8.8* 9.0 9.1 8.7*  MG 1.6*  --   --   --   --   --   --   PHOS 4.0  --   --  4.4  --   --   --   < > = values in this interval not displayed. Liver Function Tests:  Recent Labs Lab 03/09/15 0442 03/12/15 0734  AST 17  --   ALT 13*  --   ALKPHOS 50  --   BILITOT 0.4  --     PROT 6.0*  --   ALBUMIN 2.7* 2.5*    Recent Labs Lab 03/09/15 0442  LIPASE 54*   No results for input(s): AMMONIA in the last 168 hours. CBC:  Recent Labs Lab 03/08/15 2022 03/09/15 0442 03/11/15 0550 03/13/15 0459  WBC 8.1 8.2 13.1* 13.4*  NEUTROABS 5.8  --   --   --   HGB 9.2* 8.5* 8.4* 8.9*  HCT 29.6* 27.6* 26.1* 27.0*  MCV 100.3* 100.0 98.9 97.8  PLT 304 313 313 310   Cardiac Enzymes: No results for input(s): CKTOTAL, CKMB, CKMBINDEX, TROPONINI in the last 168 hours. BNP: Invalid input(s): POCBNP CBG:  Recent Labs Lab 03/13/15 1144 03/13/15 1321 03/13/15 1830 03/13/15 2156 03/14/15 0757  GLUCAP 115* 158* 234* 176* 123*    Time coordinating discharge:  Greater than 30 minutes  Signed:  Devaney Segers, DO [facility] Pager: [phone] 03/14/2015, 11:30 AM

## 2015-03-18 ENCOUNTER — Telehealth: Payer: Self-pay | Admitting: Vascular Surgery

## 2015-03-18 NOTE — Telephone Encounter (Signed)
notified patient of change in time of his appointment on 03-13-15

## 2015-03-22 ENCOUNTER — Other Ambulatory Visit (HOSPITAL_COMMUNITY): Payer: Self-pay | Admitting: Urology

## 2015-03-22 ENCOUNTER — Telehealth: Payer: Self-pay | Admitting: *Deleted

## 2015-03-22 DIAGNOSIS — N139 Obstructive and reflux uropathy, unspecified: Secondary | ICD-10-CM | POA: Diagnosis not present

## 2015-03-22 DIAGNOSIS — N184 Chronic kidney disease, stage 4 (severe): Secondary | ICD-10-CM

## 2015-03-22 DIAGNOSIS — D4101 Neoplasm of uncertain behavior of right kidney: Secondary | ICD-10-CM

## 2015-03-22 DIAGNOSIS — D4102 Neoplasm of uncertain behavior of left kidney: Secondary | ICD-10-CM | POA: Diagnosis not present

## 2015-03-22 DIAGNOSIS — C61 Malignant neoplasm of prostate: Secondary | ICD-10-CM | POA: Diagnosis not present

## 2015-03-22 NOTE — Telephone Encounter (Signed)
Patient called in to triage to report occasional swelling in his left hand. He had a Left basilic vein transposition by Dr. Oneida Alar on 03-13-15. He is afebrile and is having no drainage, erythema or swelling around this incision. He describes the left hand swelling as intermittent; he has no pain or numbness in the hand. He is able to grasp objects without any difficulty. I discussed the need for elevation and also doing finger movement exercises to decrease fluid in his hand. He has his follow up appt with Dr. Oneida Alar on 04-18-15.  He will contact us if he has any more problems prior to that postop visit. Patient voiced understanding and agreement with this plan.

## 2015-03-26 NOTE — Telephone Encounter (Signed)
Patient called in wanting to know if he could take a shower now and get his left arm wet. I instructed him on this and he will use a plain Dial soap and gently pat the incision site dry. He voiced understanding and approval of this plan.

## 2015-03-27 DIAGNOSIS — N179 Acute kidney failure, unspecified: Secondary | ICD-10-CM | POA: Diagnosis not present

## 2015-04-01 ENCOUNTER — Ambulatory Visit (HOSPITAL_COMMUNITY)
Admission: RE | Admit: 2015-04-01 | Discharge: 2015-04-01 | Disposition: A | Discharge status: Home / Self Care | Payer: Medicare Other | Source: Ambulatory Visit | Attending: Urology | Admitting: Urology

## 2015-04-01 DIAGNOSIS — Z85038 Personal history of other malignant neoplasm of large intestine: Secondary | ICD-10-CM | POA: Insufficient documentation

## 2015-04-01 DIAGNOSIS — D49512 Neoplasm of unspecified behavior of left kidney: Secondary | ICD-10-CM | POA: Insufficient documentation

## 2015-04-01 DIAGNOSIS — N184 Chronic kidney disease, stage 4 (severe): Secondary | ICD-10-CM | POA: Diagnosis not present

## 2015-04-01 DIAGNOSIS — D49511 Neoplasm of unspecified behavior of right kidney: Secondary | ICD-10-CM | POA: Diagnosis not present

## 2015-04-01 DIAGNOSIS — N2889 Other specified disorders of kidney and ureter: Secondary | ICD-10-CM | POA: Diagnosis not present

## 2015-04-01 DIAGNOSIS — D4101 Neoplasm of uncertain behavior of right kidney: Secondary | ICD-10-CM

## 2015-04-01 DIAGNOSIS — D4102 Neoplasm of uncertain behavior of left kidney: Secondary | ICD-10-CM

## 2015-04-02 DIAGNOSIS — D631 Anemia in chronic kidney disease: Secondary | ICD-10-CM | POA: Diagnosis not present

## 2015-04-02 DIAGNOSIS — N2581 Secondary hyperparathyroidism of renal origin: Secondary | ICD-10-CM | POA: Diagnosis not present

## 2015-04-02 DIAGNOSIS — N184 Chronic kidney disease, stage 4 (severe): Secondary | ICD-10-CM | POA: Diagnosis not present

## 2015-04-02 DIAGNOSIS — I129 Hypertensive chronic kidney disease with stage 1 through stage 4 chronic kidney disease, or unspecified chronic kidney disease: Secondary | ICD-10-CM | POA: Diagnosis not present

## 2015-04-02 DIAGNOSIS — R339 Retention of urine, unspecified: Secondary | ICD-10-CM | POA: Diagnosis not present

## 2015-04-15 ENCOUNTER — Encounter: Payer: Self-pay | Admitting: Vascular Surgery

## 2015-04-18 ENCOUNTER — Ambulatory Visit (INDEPENDENT_AMBULATORY_CARE_PROVIDER_SITE_OTHER): Payer: Medicare Other | Admitting: Vascular Surgery

## 2015-04-18 ENCOUNTER — Encounter (HOSPITAL_COMMUNITY): Payer: BC Managed Care – PPO

## 2015-04-18 ENCOUNTER — Encounter: Payer: Self-pay | Admitting: Vascular Surgery

## 2015-04-18 VITALS — BP 126/67 | HR 87 | Temp 97.7°F | Resp 18 | Ht 71.5 in | Wt 214.4 lb

## 2015-04-18 DIAGNOSIS — N184 Chronic kidney disease, stage 4 (severe): Secondary | ICD-10-CM

## 2015-04-18 NOTE — Progress Notes (Signed)
Patient is 65 year old male who underwent left basilic vein transposition fistula 03/13/2015. He is not currently on hemodialysis. He states he was able to feel the thrill in the fistula on discharge from the hospital. However there is no thrill present currently. He does not recall when the thrill went away. He did state he was in the hospital recently with issues related to his diabetes. He was not unconscious and does not really recall any hypotensive episodes.   Physical exam:  Filed Vitals:   04/18/15 1157  BP: 126/67  Pulse: 87  Temp: 97.7 F (36.5 C)  TempSrc: Oral  Resp: 18  Height: 5' 11.5" (1.816 m)  Weight: 214 lb 6.4 oz (97.251 kg)  SpO2: 97%     Left upper extremity: thrombosed left  basilic vein transposition fistula no palpable thrill no audible bruit palpable cord within the vein   Assessment: Patient thrombosed fistula less than 6 weeks unknown etiology was a good quality vein difficult to know how long this is been thrombosed from the patient's history   Plan: Since the patient currently is not on hemodialysis. I would not place any access until he is close to the point of requiring it. His next option would be to consider placing a left upper arm AV graft. All this was discussed with the patient today. He'll call us on an as-needed basis if Dr. Posey Pronto thinks he needs an access at some point in the near future.  [name], MD [facility] Office: [phone] Pager: [phone]

## 2015-04-22 DIAGNOSIS — Z8546 Personal history of malignant neoplasm of prostate: Secondary | ICD-10-CM | POA: Diagnosis not present

## 2015-04-22 DIAGNOSIS — N401 Enlarged prostate with lower urinary tract symptoms: Secondary | ICD-10-CM | POA: Diagnosis not present

## 2015-04-22 DIAGNOSIS — Z0000 Encounter for general adult medical examination without abnormal findings: Secondary | ICD-10-CM | POA: Diagnosis not present

## 2015-04-22 DIAGNOSIS — R338 Other retention of urine: Secondary | ICD-10-CM | POA: Diagnosis not present

## 2015-04-22 DIAGNOSIS — C61 Malignant neoplasm of prostate: Secondary | ICD-10-CM | POA: Diagnosis not present

## 2015-05-27 DIAGNOSIS — N281 Cyst of kidney, acquired: Secondary | ICD-10-CM | POA: Diagnosis not present

## 2015-05-27 DIAGNOSIS — Z8546 Personal history of malignant neoplasm of prostate: Secondary | ICD-10-CM | POA: Diagnosis not present

## 2015-05-27 DIAGNOSIS — Z0000 Encounter for general adult medical examination without abnormal findings: Secondary | ICD-10-CM | POA: Diagnosis not present

## 2015-05-31 DIAGNOSIS — N2581 Secondary hyperparathyroidism of renal origin: Secondary | ICD-10-CM | POA: Diagnosis not present

## 2015-05-31 DIAGNOSIS — N184 Chronic kidney disease, stage 4 (severe): Secondary | ICD-10-CM | POA: Diagnosis not present

## 2015-05-31 DIAGNOSIS — N189 Chronic kidney disease, unspecified: Secondary | ICD-10-CM | POA: Diagnosis not present

## 2015-06-04 DIAGNOSIS — N2581 Secondary hyperparathyroidism of renal origin: Secondary | ICD-10-CM | POA: Diagnosis not present

## 2015-06-04 DIAGNOSIS — I129 Hypertensive chronic kidney disease with stage 1 through stage 4 chronic kidney disease, or unspecified chronic kidney disease: Secondary | ICD-10-CM | POA: Diagnosis not present

## 2015-06-04 DIAGNOSIS — N184 Chronic kidney disease, stage 4 (severe): Secondary | ICD-10-CM | POA: Diagnosis not present

## 2015-06-04 DIAGNOSIS — D631 Anemia in chronic kidney disease: Secondary | ICD-10-CM | POA: Diagnosis not present

## 2015-08-12 DIAGNOSIS — D631 Anemia in chronic kidney disease: Secondary | ICD-10-CM | POA: Diagnosis not present

## 2015-08-12 DIAGNOSIS — E669 Obesity, unspecified: Secondary | ICD-10-CM | POA: Diagnosis not present

## 2015-08-12 DIAGNOSIS — N185 Chronic kidney disease, stage 5: Secondary | ICD-10-CM | POA: Diagnosis not present

## 2015-08-12 DIAGNOSIS — N184 Chronic kidney disease, stage 4 (severe): Secondary | ICD-10-CM | POA: Diagnosis not present

## 2015-08-12 DIAGNOSIS — N189 Chronic kidney disease, unspecified: Secondary | ICD-10-CM | POA: Diagnosis not present

## 2015-08-12 DIAGNOSIS — I120 Hypertensive chronic kidney disease with stage 5 chronic kidney disease or end stage renal disease: Secondary | ICD-10-CM | POA: Diagnosis not present

## 2015-08-12 DIAGNOSIS — N2581 Secondary hyperparathyroidism of renal origin: Secondary | ICD-10-CM | POA: Diagnosis not present

## 2015-09-25 ENCOUNTER — Ambulatory Visit (HOSPITAL_COMMUNITY)
Admission: EM | Admit: 2015-09-25 | Discharge: 2015-09-25 | Disposition: A | Discharge status: Home / Self Care | Payer: Medicare Other | Attending: Family Medicine | Admitting: Family Medicine

## 2015-09-25 ENCOUNTER — Encounter (HOSPITAL_COMMUNITY): Payer: Self-pay | Admitting: *Deleted

## 2015-09-25 DIAGNOSIS — R21 Rash and other nonspecific skin eruption: Secondary | ICD-10-CM | POA: Diagnosis not present

## 2015-09-25 DIAGNOSIS — L821 Other seborrheic keratosis: Secondary | ICD-10-CM

## 2015-09-25 MED ORDER — HYDROCORTISONE 1 % EX CREA: 1.0000 "application " | TOPICAL_CREAM | Freq: Two times a day (BID) | CUTANEOUS | 0 refills | Status: DC

## 2015-09-25 NOTE — ED Provider Notes (Signed)
[place]    CSN: [id number] Arrival date & time: 09/25/15  1302  First Provider Contact:  First MD Initiated Contact with Patient 09/25/15 1358     History   Chief Complaint Chief Complaint  Patient presents with  . Abscess   HPI [name] is a 65 y.o. male presenting for inflammation on the left face.   He notes waking up 2 days of irregular redness and swelling that is minorly bothersome, somewhat itchy and painful. He notes no outdoor exposures, new creams/lotions, sunscreens, detergents/soaps. He denies numbness, tingling preceding or currently. He's tried nothing, but the areas are getting worse.   HPI  Past Medical History:  Diagnosis Date  . Cancer ([place])   . Chronic kidney disease   . COPD (chronic obstructive pulmonary disease) ([place])   . Diabetes mellitus   . High cholesterol   . Hypertension   . Shortness of breath dyspnea    Patient Active Problem List   Diagnosis Date Noted  . Acute respiratory failure with hypoxia ([place]) 03/14/2015  . Acute renal failure superimposed on stage 4 chronic kidney disease ([place]) 03/13/2015  . Acute renal failure (ARF) ([place]) 03/09/2015  . COPD exacerbation ([place])   . Diabetes mellitus ([place]) 03/08/2015  . Anemia 03/08/2015  . Acute on chronic renal failure ([place]) 03/08/2015  . Urinary retention 03/08/2015  . Kidney lesion, native, left 03/08/2015  . Pericardial effusion 03/08/2015  . COPD (chronic obstructive pulmonary disease) ([place]) 03/08/2015  . Tobacco abuse 03/08/2015  . CHRONIC KIDNEY DISEASE STAGE III (MODERATE) 11/06/2009  . ELEVATED PROSTATE SPECIFIC ANTIGEN 11/04/2009  . HYPERLIPIDEMIA 10/10/2009  . ERECTILE DYSFUNCTION 10/10/2009  . Essential hypertension 10/10/2009  . PREDIABETES 10/10/2009  . COLONIC POLYPS, HX OF 10/10/2009   Past Surgical History:  Procedure Laterality Date  . AV FISTULA PLACEMENT Left 03/13/2015   Procedure: Left arm basilic vein transposition .;  Surgeon: Elam Dutch, MD;   Location: [facility] OR;  Service: Vascular;  Laterality: Left;  . COLON RESECTION    . KNEE SURGERY    . TOTAL HIP ARTHROPLASTY     bilat  . TOTAL SHOULDER ARTHROPLASTY     right    Home Medications    Prior to Admission medications   Medication Sig Start Date End Date Taking? Authorizing Provider  amLODipine (NORVASC) 10 MG tablet Take 10 mg by mouth daily.    Historical Provider, MD  atorvastatin (LIPITOR) 20 MG tablet Take 10 mg by mouth daily.    Historical Provider, MD  bisacodyl (DULCOLAX) 5 MG EC tablet Take 5 mg by mouth daily as needed for moderate constipation.    Historical Provider, MD  calcium acetate (PHOSLO) 667 MG capsule Take 1,334 mg by mouth 3 (three) times daily with meals.    Historical Provider, MD  Cholecalciferol (VITAMIN D-3) 1000 units CAPS Take 1 capsule by mouth 2 (two) times daily.    Historical Provider, MD  doxycycline (VIBRA-TABS) 100 MG tablet Take 1 tablet (100 mg total) by mouth every 12 (twelve) hours. Patient not taking: Reported on 04/18/2015 03/14/15   Orson Eva, MD  folic acid (FOLVITE) 1 MG tablet Take 1 tablet (1 mg total) by mouth daily. Patient not taking: Reported on 04/18/2015 03/14/15   Orson Eva, MD  furosemide (LASIX) 20 MG tablet Take 1 tablet (20 mg total) by mouth daily. 03/14/15   Orson Eva, MD  glipiZIDE (GLUCOTROL) 10 MG tablet Take 1 tablet (10 mg total) by mouth daily before breakfast. 03/14/15  Orson Eva, MD  hydrocortisone cream 1 % Apply 1 application topically 2 (two) times daily. 09/25/15   Patrecia Pour, MD  oxybutynin (DITROPAN) 5 MG tablet 5 mg. Daily at bedtime 03/30/15   Historical Provider, MD  predniSONE (DELTASONE) 10 MG tablet Take 3 tablets (30 mg total) by mouth daily with breakfast. And decrease by one tablet daily Patient not taking: Reported on 04/18/2015 03/15/15   Orson Eva, MD  sodium bicarbonate 650 MG tablet Take 1,300 mg by mouth 2 (two) times daily.    Historical Provider, MD  tamsulosin (FLOMAX) 0.4 MG CAPS capsule Take 0.4 mg by  mouth daily.    Historical Provider, MD  vitamin B-12 500 MCG tablet Take 1 tablet (500 mcg total) by mouth daily. 03/14/15   Orson Eva, MD   Family History Family History  Problem Relation Age of Onset  . Heart failure Mother   . CAD Father   . Prostate cancer Neg Hx   . Kidney cancer Neg Hx     Social History Social History  Substance Use Topics  . Smoking status: Current Some Day Smoker    Packs/day: 0.03    Types: Cigarettes  . Smokeless tobacco: Not on file  . Alcohol use No     Allergies   Pravastatin and Simvastatin   Review of Systems Review of Systems As above  Physical Exam Triage Vital Signs ED Triage Vitals [09/25/15 1401]  Enc Vitals Group     BP 186/94     Pulse Rate 78     Resp      Temp 98.6 F (37 C)     Temp Source Oral     SpO2 100 %     Weight      Height      Head Circumference      Peak Flow      Pain Score      Pain Loc      Pain Edu?      Excl. in [place]?    No data found.   Updated Vital Signs BP 186/94 (BP Location: Right Arm)   Pulse 78   Temp 98.6 F (37 C) (Oral)   SpO2 100%   Physical Exam  Constitutional: He is oriented to person, place, and time. He appears well-developed and well-nourished. No distress.  HENT:  Head: Normocephalic and atraumatic.  Right Ear: External ear normal.  Left Ear: External ear normal.  Nose: Nose normal.  Mouth/Throat: Oropharynx is clear and moist. No oropharyngeal exudate.  Eyes: EOM are normal. Pupils are equal, round, and reactive to light. No scleral icterus.  left conjunctiva mildly injected with perilimbal sparing  Neck: Neck supple. No JVD present.  Cardiovascular: Normal rate, regular rhythm, normal heart sounds and intact distal pulses.   No murmur heard. Pulmonary/Chest: Effort normal and breath sounds normal. No respiratory distress.  Abdominal: Soft. Bowel sounds are normal. He exhibits no distension. There is no tenderness.  Musculoskeletal: Normal range of motion. He  exhibits no edema or tenderness.  Lymphadenopathy:    He has no cervical adenopathy.  Neurological: He is alert and oriented to person, place, and time. He exhibits normal muscle tone.  Skin: Skin is warm and dry.  Diffuse dermatosis papulosa nigra bilaterally with ~7mm erythema circumferentially around 5 of these areas on the left cheek. There's a single excoriated hyperpigmented papule without drainage, scabbing over. No vesicular lesions.   Vitals reviewed.    UC Treatments / Results  Labs (all  labs ordered are listed, but only abnormal results are displayed) Labs Reviewed - No data to display  EKG  EKG Interpretation None      Radiology No results found.  Procedures Procedures (including critical care time)  Medications Ordered in UC Medications - No data to display  Initial Impression / Assessment and Plan / UC Course  I have reviewed the triage vital signs and the nursing notes.  Pertinent labs & imaging results that were available during my care of the patient were reviewed by me and considered in my medical decision making (see chart for details).  Final Clinical Impressions(s) / UC Diagnoses   Final diagnoses:  Dermatosis papulosa nigra  Facial rash   65 y.o. male with irritation of dermatosis papulosa nigra lesions on the left face. No central lesion to suggest bite/infection. This irritation could be occurring due to scratching during sleep as symptoms began upon waking and worsen in the mornings. Will trial low potency steroid cream and he will follow up with PCP if not improving or new symptoms arise.   Doubt shingles with minimal pain, no vesicles, and involvement of both maxillary and mandibular trigeminal branches.   New Prescriptions New Prescriptions   HYDROCORTISONE CREAM 1 %    Apply 1 application topically 2 (two) times daily.     Patrecia Pour, MD 09/25/15 [phone]

## 2015-09-25 NOTE — Discharge Instructions (Signed)
Let's try the steroid cream to help with the inflammation of your skin. If this is not helping or your symptoms worsen over the next couple days, you need to see your PCP or return for care at the urgent care. If you experience any change in vision you need to proceed for urgent care.

## 2015-09-25 NOTE — ED Triage Notes (Signed)
Pty  Reports   Swelling   And   Tenderness to  l    Side  Of  Face     With  Swelling  Present   Symptoms

## 2015-10-30 DIAGNOSIS — D631 Anemia in chronic kidney disease: Secondary | ICD-10-CM | POA: Diagnosis not present

## 2015-10-30 DIAGNOSIS — N189 Chronic kidney disease, unspecified: Secondary | ICD-10-CM | POA: Diagnosis not present

## 2015-10-30 DIAGNOSIS — N2581 Secondary hyperparathyroidism of renal origin: Secondary | ICD-10-CM | POA: Diagnosis not present

## 2015-10-30 DIAGNOSIS — I120 Hypertensive chronic kidney disease with stage 5 chronic kidney disease or end stage renal disease: Secondary | ICD-10-CM | POA: Diagnosis not present

## 2015-10-30 DIAGNOSIS — Z6831 Body mass index (BMI) 31.0-31.9, adult: Secondary | ICD-10-CM | POA: Diagnosis not present

## 2015-10-30 DIAGNOSIS — N185 Chronic kidney disease, stage 5: Secondary | ICD-10-CM | POA: Diagnosis not present

## 2015-12-18 DIAGNOSIS — N185 Chronic kidney disease, stage 5: Secondary | ICD-10-CM | POA: Diagnosis not present

## 2015-12-31 DIAGNOSIS — Z23 Encounter for immunization: Secondary | ICD-10-CM | POA: Diagnosis not present

## 2015-12-31 DIAGNOSIS — I120 Hypertensive chronic kidney disease with stage 5 chronic kidney disease or end stage renal disease: Secondary | ICD-10-CM | POA: Diagnosis not present

## 2015-12-31 DIAGNOSIS — D631 Anemia in chronic kidney disease: Secondary | ICD-10-CM | POA: Diagnosis not present

## 2015-12-31 DIAGNOSIS — N185 Chronic kidney disease, stage 5: Secondary | ICD-10-CM | POA: Diagnosis not present

## 2015-12-31 DIAGNOSIS — N2581 Secondary hyperparathyroidism of renal origin: Secondary | ICD-10-CM | POA: Diagnosis not present

## 2016-01-06 ENCOUNTER — Encounter: Payer: Self-pay | Admitting: Vascular Surgery

## 2016-01-07 ENCOUNTER — Ambulatory Visit: Payer: Medicare Other | Admitting: Vascular Surgery

## 2016-01-07 DIAGNOSIS — I871 Compression of vein: Secondary | ICD-10-CM | POA: Diagnosis not present

## 2016-01-07 DIAGNOSIS — N186 End stage renal disease: Secondary | ICD-10-CM | POA: Diagnosis not present

## 2016-01-07 DIAGNOSIS — Z992 Dependence on renal dialysis: Secondary | ICD-10-CM | POA: Diagnosis not present

## 2016-01-08 DIAGNOSIS — N185 Chronic kidney disease, stage 5: Secondary | ICD-10-CM | POA: Diagnosis not present

## 2016-01-13 DIAGNOSIS — N186 End stage renal disease: Secondary | ICD-10-CM | POA: Diagnosis not present

## 2016-01-13 DIAGNOSIS — E1129 Type 2 diabetes mellitus with other diabetic kidney complication: Secondary | ICD-10-CM | POA: Diagnosis not present

## 2016-01-13 DIAGNOSIS — Z23 Encounter for immunization: Secondary | ICD-10-CM | POA: Diagnosis not present

## 2016-01-13 DIAGNOSIS — D509 Iron deficiency anemia, unspecified: Secondary | ICD-10-CM | POA: Diagnosis not present

## 2016-01-13 DIAGNOSIS — D631 Anemia in chronic kidney disease: Secondary | ICD-10-CM | POA: Diagnosis not present

## 2016-01-13 DIAGNOSIS — N2581 Secondary hyperparathyroidism of renal origin: Secondary | ICD-10-CM | POA: Diagnosis not present

## 2016-01-15 DIAGNOSIS — D631 Anemia in chronic kidney disease: Secondary | ICD-10-CM | POA: Diagnosis not present

## 2016-01-15 DIAGNOSIS — N186 End stage renal disease: Secondary | ICD-10-CM | POA: Diagnosis not present

## 2016-01-15 DIAGNOSIS — D509 Iron deficiency anemia, unspecified: Secondary | ICD-10-CM | POA: Diagnosis not present

## 2016-01-15 DIAGNOSIS — E1129 Type 2 diabetes mellitus with other diabetic kidney complication: Secondary | ICD-10-CM | POA: Diagnosis not present

## 2016-01-15 DIAGNOSIS — Z23 Encounter for immunization: Secondary | ICD-10-CM | POA: Diagnosis not present

## 2016-01-15 DIAGNOSIS — N2581 Secondary hyperparathyroidism of renal origin: Secondary | ICD-10-CM | POA: Diagnosis not present

## 2016-01-17 ENCOUNTER — Ambulatory Visit: Payer: Medicare Other | Admitting: Vascular Surgery

## 2016-01-17 DIAGNOSIS — E1129 Type 2 diabetes mellitus with other diabetic kidney complication: Secondary | ICD-10-CM | POA: Diagnosis not present

## 2016-01-17 DIAGNOSIS — D631 Anemia in chronic kidney disease: Secondary | ICD-10-CM | POA: Diagnosis not present

## 2016-01-17 DIAGNOSIS — Z23 Encounter for immunization: Secondary | ICD-10-CM | POA: Diagnosis not present

## 2016-01-17 DIAGNOSIS — N186 End stage renal disease: Secondary | ICD-10-CM | POA: Diagnosis not present

## 2016-01-17 DIAGNOSIS — N2581 Secondary hyperparathyroidism of renal origin: Secondary | ICD-10-CM | POA: Diagnosis not present

## 2016-01-17 DIAGNOSIS — D509 Iron deficiency anemia, unspecified: Secondary | ICD-10-CM | POA: Diagnosis not present

## 2016-01-20 DIAGNOSIS — D631 Anemia in chronic kidney disease: Secondary | ICD-10-CM | POA: Diagnosis not present

## 2016-01-20 DIAGNOSIS — Z23 Encounter for immunization: Secondary | ICD-10-CM | POA: Diagnosis not present

## 2016-01-20 DIAGNOSIS — N186 End stage renal disease: Secondary | ICD-10-CM | POA: Diagnosis not present

## 2016-01-20 DIAGNOSIS — E1129 Type 2 diabetes mellitus with other diabetic kidney complication: Secondary | ICD-10-CM | POA: Diagnosis not present

## 2016-01-20 DIAGNOSIS — N2581 Secondary hyperparathyroidism of renal origin: Secondary | ICD-10-CM | POA: Diagnosis not present

## 2016-01-20 DIAGNOSIS — D509 Iron deficiency anemia, unspecified: Secondary | ICD-10-CM | POA: Diagnosis not present

## 2016-01-24 DIAGNOSIS — D631 Anemia in chronic kidney disease: Secondary | ICD-10-CM | POA: Diagnosis not present

## 2016-01-24 DIAGNOSIS — E1129 Type 2 diabetes mellitus with other diabetic kidney complication: Secondary | ICD-10-CM | POA: Diagnosis not present

## 2016-01-24 DIAGNOSIS — N186 End stage renal disease: Secondary | ICD-10-CM | POA: Diagnosis not present

## 2016-01-24 DIAGNOSIS — Z23 Encounter for immunization: Secondary | ICD-10-CM | POA: Diagnosis not present

## 2016-01-24 DIAGNOSIS — D509 Iron deficiency anemia, unspecified: Secondary | ICD-10-CM | POA: Diagnosis not present

## 2016-01-24 DIAGNOSIS — N2581 Secondary hyperparathyroidism of renal origin: Secondary | ICD-10-CM | POA: Diagnosis not present

## 2016-01-27 DIAGNOSIS — E1129 Type 2 diabetes mellitus with other diabetic kidney complication: Secondary | ICD-10-CM | POA: Diagnosis not present

## 2016-01-27 DIAGNOSIS — D631 Anemia in chronic kidney disease: Secondary | ICD-10-CM | POA: Diagnosis not present

## 2016-01-27 DIAGNOSIS — D509 Iron deficiency anemia, unspecified: Secondary | ICD-10-CM | POA: Diagnosis not present

## 2016-01-27 DIAGNOSIS — N2581 Secondary hyperparathyroidism of renal origin: Secondary | ICD-10-CM | POA: Diagnosis not present

## 2016-01-27 DIAGNOSIS — N186 End stage renal disease: Secondary | ICD-10-CM | POA: Diagnosis not present

## 2016-01-27 DIAGNOSIS — Z23 Encounter for immunization: Secondary | ICD-10-CM | POA: Diagnosis not present

## 2016-01-29 DIAGNOSIS — E1129 Type 2 diabetes mellitus with other diabetic kidney complication: Secondary | ICD-10-CM | POA: Diagnosis not present

## 2016-01-29 DIAGNOSIS — N186 End stage renal disease: Secondary | ICD-10-CM | POA: Diagnosis not present

## 2016-01-29 DIAGNOSIS — D631 Anemia in chronic kidney disease: Secondary | ICD-10-CM | POA: Diagnosis not present

## 2016-01-29 DIAGNOSIS — D509 Iron deficiency anemia, unspecified: Secondary | ICD-10-CM | POA: Diagnosis not present

## 2016-01-29 DIAGNOSIS — Z23 Encounter for immunization: Secondary | ICD-10-CM | POA: Diagnosis not present

## 2016-01-29 DIAGNOSIS — N2581 Secondary hyperparathyroidism of renal origin: Secondary | ICD-10-CM | POA: Diagnosis not present

## 2016-01-30 ENCOUNTER — Encounter: Payer: Self-pay | Admitting: Vascular Surgery

## 2016-01-31 DIAGNOSIS — D509 Iron deficiency anemia, unspecified: Secondary | ICD-10-CM | POA: Diagnosis not present

## 2016-01-31 DIAGNOSIS — N2581 Secondary hyperparathyroidism of renal origin: Secondary | ICD-10-CM | POA: Diagnosis not present

## 2016-01-31 DIAGNOSIS — E1129 Type 2 diabetes mellitus with other diabetic kidney complication: Secondary | ICD-10-CM | POA: Diagnosis not present

## 2016-01-31 DIAGNOSIS — N186 End stage renal disease: Secondary | ICD-10-CM | POA: Diagnosis not present

## 2016-01-31 DIAGNOSIS — D631 Anemia in chronic kidney disease: Secondary | ICD-10-CM | POA: Diagnosis not present

## 2016-01-31 DIAGNOSIS — Z23 Encounter for immunization: Secondary | ICD-10-CM | POA: Diagnosis not present

## 2016-02-02 DIAGNOSIS — Z23 Encounter for immunization: Secondary | ICD-10-CM | POA: Diagnosis not present

## 2016-02-02 DIAGNOSIS — E1129 Type 2 diabetes mellitus with other diabetic kidney complication: Secondary | ICD-10-CM | POA: Diagnosis not present

## 2016-02-02 DIAGNOSIS — D631 Anemia in chronic kidney disease: Secondary | ICD-10-CM | POA: Diagnosis not present

## 2016-02-02 DIAGNOSIS — N2581 Secondary hyperparathyroidism of renal origin: Secondary | ICD-10-CM | POA: Diagnosis not present

## 2016-02-02 DIAGNOSIS — N186 End stage renal disease: Secondary | ICD-10-CM | POA: Diagnosis not present

## 2016-02-02 DIAGNOSIS — D509 Iron deficiency anemia, unspecified: Secondary | ICD-10-CM | POA: Diagnosis not present

## 2016-02-05 DIAGNOSIS — N2581 Secondary hyperparathyroidism of renal origin: Secondary | ICD-10-CM | POA: Diagnosis not present

## 2016-02-05 DIAGNOSIS — N186 End stage renal disease: Secondary | ICD-10-CM | POA: Diagnosis not present

## 2016-02-05 DIAGNOSIS — Z23 Encounter for immunization: Secondary | ICD-10-CM | POA: Diagnosis not present

## 2016-02-05 DIAGNOSIS — D509 Iron deficiency anemia, unspecified: Secondary | ICD-10-CM | POA: Diagnosis not present

## 2016-02-05 DIAGNOSIS — E1129 Type 2 diabetes mellitus with other diabetic kidney complication: Secondary | ICD-10-CM | POA: Diagnosis not present

## 2016-02-05 DIAGNOSIS — D631 Anemia in chronic kidney disease: Secondary | ICD-10-CM | POA: Diagnosis not present

## 2016-02-07 DIAGNOSIS — Z23 Encounter for immunization: Secondary | ICD-10-CM | POA: Diagnosis not present

## 2016-02-07 DIAGNOSIS — E1129 Type 2 diabetes mellitus with other diabetic kidney complication: Secondary | ICD-10-CM | POA: Diagnosis not present

## 2016-02-07 DIAGNOSIS — N2581 Secondary hyperparathyroidism of renal origin: Secondary | ICD-10-CM | POA: Diagnosis not present

## 2016-02-07 DIAGNOSIS — N186 End stage renal disease: Secondary | ICD-10-CM | POA: Diagnosis not present

## 2016-02-07 DIAGNOSIS — D631 Anemia in chronic kidney disease: Secondary | ICD-10-CM | POA: Diagnosis not present

## 2016-02-07 DIAGNOSIS — D509 Iron deficiency anemia, unspecified: Secondary | ICD-10-CM | POA: Diagnosis not present

## 2016-02-09 DIAGNOSIS — E1122 Type 2 diabetes mellitus with diabetic chronic kidney disease: Secondary | ICD-10-CM | POA: Diagnosis not present

## 2016-02-09 DIAGNOSIS — N186 End stage renal disease: Secondary | ICD-10-CM | POA: Diagnosis not present

## 2016-02-09 DIAGNOSIS — Z992 Dependence on renal dialysis: Secondary | ICD-10-CM | POA: Diagnosis not present

## 2016-02-09 DIAGNOSIS — D631 Anemia in chronic kidney disease: Secondary | ICD-10-CM | POA: Diagnosis not present

## 2016-02-09 DIAGNOSIS — E1129 Type 2 diabetes mellitus with other diabetic kidney complication: Secondary | ICD-10-CM | POA: Diagnosis not present

## 2016-02-09 DIAGNOSIS — D509 Iron deficiency anemia, unspecified: Secondary | ICD-10-CM | POA: Diagnosis not present

## 2016-02-09 DIAGNOSIS — N2581 Secondary hyperparathyroidism of renal origin: Secondary | ICD-10-CM | POA: Diagnosis not present

## 2016-02-09 DIAGNOSIS — Z23 Encounter for immunization: Secondary | ICD-10-CM | POA: Diagnosis not present

## 2016-02-11 ENCOUNTER — Encounter: Payer: Self-pay | Admitting: Vascular Surgery

## 2016-02-11 ENCOUNTER — Ambulatory Visit (INDEPENDENT_AMBULATORY_CARE_PROVIDER_SITE_OTHER): Payer: Medicare Other | Admitting: Vascular Surgery

## 2016-02-11 VITALS — BP 145/78 | HR 81 | Temp 98.6°F | Resp 16 | Ht 71.5 in | Wt 202.0 lb

## 2016-02-11 DIAGNOSIS — N186 End stage renal disease: Secondary | ICD-10-CM

## 2016-02-11 NOTE — Progress Notes (Signed)
Vascular and Vein Specialist of [place]  Patient name: [name] MRN: [id number] DOB: 1950-07-11 Sex: male  REASON FOR VISIT: Discuss access for hemodialysis  HPI: [name] is a 66 y.o. male seen today for discussion of access for hemodialysis. He initially had a left arm basilic vein transposition fistula with Dr. Oneida Alar in March 2017 while he was an inpatient. At that time he was stage IV renal. This subsequently failed and since he was not on hemodialysis decision was made to defer placement. He now has renal failure and is being dialyzed via a right IJ hemodialysis catheter. He is left-handed.  Past Medical History:  Diagnosis Date  . Cancer ([place])   . Chronic kidney disease   . COPD (chronic obstructive pulmonary disease) ([place])   . Diabetes mellitus   . High cholesterol   . Hypertension   . Shortness of breath dyspnea     Family History  Problem Relation Age of Onset  . Heart failure Mother   . CAD Father   . Prostate cancer Neg Hx   . Kidney cancer Neg Hx     SOCIAL HISTORY: Social History  Substance Use Topics  . Smoking status: Current Some Day Smoker    Packs/day: 0.03    Types: Cigarettes  . Smokeless tobacco: Never Used  . Alcohol use No    Allergies  Allergen Reactions  . Pravastatin     Muscle pain  . Simvastatin     Muscle pain    Current Outpatient Prescriptions  Medication Sig Dispense Refill  . amLODipine (NORVASC) 10 MG tablet Take 10 mg by mouth daily.    Marland Kitchen atorvastatin (LIPITOR) 20 MG tablet Take 10 mg by mouth daily.    . bisacodyl (DULCOLAX) 5 MG EC tablet Take 5 mg by mouth daily as needed for moderate constipation.    . calcium acetate (PHOSLO) 667 MG capsule Take 1,334 mg by mouth 3 (three) times daily with meals.    . Cholecalciferol (VITAMIN D-3) 1000 units CAPS Take 1 capsule by mouth 2 (two) times daily.    Marland Kitchen doxycycline (VIBRA-TABS) 100 MG tablet Take 1 tablet (100 mg total) by mouth every  12 (twelve) hours. 4 tablet 0  . folic acid (FOLVITE) 1 MG tablet Take 1 tablet (1 mg total) by mouth daily. 30 tablet 0  . furosemide (LASIX) 20 MG tablet Take 1 tablet (20 mg total) by mouth daily. 30 tablet 0  . glipiZIDE (GLUCOTROL) 10 MG tablet Take 1 tablet (10 mg total) by mouth daily before breakfast. 30 tablet 0  . hydrocortisone cream 1 % Apply 1 application topically 2 (two) times daily. 30 g 0  . oxybutynin (DITROPAN) 5 MG tablet 5 mg. Daily at bedtime    . predniSONE (DELTASONE) 10 MG tablet Take 3 tablets (30 mg total) by mouth daily with breakfast. And decrease by one tablet daily 6 tablet 0  . sodium bicarbonate 650 MG tablet Take 1,300 mg by mouth 2 (two) times daily.    . tamsulosin (FLOMAX) 0.4 MG CAPS capsule Take 0.4 mg by mouth daily.    . vitamin B-12 500 MCG tablet Take 1 tablet (500 mcg total) by mouth daily. 30 tablet 0   No current facility-administered medications for this visit.     REVIEW OF SYSTEMS:  [X]  denotes positive finding, [ ]  denotes negative finding Cardiac  Comments:  Chest pain or chest pressure:    Shortness of breath upon exertion:  Short of breath when lying flat:    Irregular heart rhythm:        Vascular    Pain in calf, thigh, or hip brought on by ambulation:    Pain in feet at night that wakes you up from your sleep:     Blood clot in your veins:    Leg swelling:           PHYSICAL EXAM: Vitals:   02/11/16 1535  BP: (!) 145/78  Pulse: 81  Resp: 16  Temp: 98.6 F (37 C)  TempSrc: Oral  SpO2: 95%  Weight: 202 lb (91.6 kg)  Height: 5' 11.5" (1.816 m)    GENERAL: The patient is a well-nourished male, in no acute distress. The vital signs are documented above. CARDIOVASCULAR: 2+ radial pulses bilaterally. Well-healed left antecubital incision with no thrill present. He does have a very large cephalic vein at the level of his wrist and this is very superficial throughout his forearm. PULMONARY: There is good air exchange    MUSCULOSKELETAL: There are no major deformities or cyanosis. NEUROLOGIC: No focal weakness or paresthesias are detected. SKIN: There are no ulcers or rashes noted. PSYCHIATRIC: The patient has a normal affect.  DATA:  Vein mapping shows patency of the cysts cephalic vein with good caliber throughout the forearm.  MEDICAL ISSUES: Discussed options with patient. Facility is an excellent candidate for a radiocephalic fistula. Image this with SonoSite myself does have a very nice caliber throughout the forearm extending into the antecubital vein. He currently undergoes hemodialysis on Monday Wednesday and Friday. Understands that one of my partners will be doing his surgery on a Tuesday or Thursday since he can be placed on our schedule.    Rosetta Posner, MD FACS [facility] and Vein Specialists of [facility] Tel [phone] Pager [phone]

## 2016-02-12 ENCOUNTER — Other Ambulatory Visit: Payer: Self-pay

## 2016-02-17 ENCOUNTER — Encounter (HOSPITAL_COMMUNITY): Payer: Self-pay | Admitting: *Deleted

## 2016-02-17 NOTE — Progress Notes (Signed)
Spoke with pt for pre-op call. Pt denies cardiac history, chest pain or sob. Pt is diabetic. He can't remember when or what is A1C was done. States his fasting blood sugar is usually between 124-145. Instructed pt to check blood sugar in the AM. If blood sugar is 70 or below, treat with 1/2 cup of clear juice (apple or cranberry) and recheck blood sugar 15 minutes after drinking juice.

## 2016-02-17 NOTE — Anesthesia Preprocedure Evaluation (Addendum)
Anesthesia Evaluation  Patient identified by MRN, date of birth, ID band Patient awake    Reviewed: Allergy & Precautions, NPO status , Patient's Chart, lab work & pertinent test results  History of Anesthesia Complications Negative for: history of anesthetic complications  Airway Mallampati: II  TM Distance: >3 FB Neck ROM: Full    Dental  (+) Poor Dentition, Missing, Chipped, Dental Advisory Given   Pulmonary COPD,  COPD inhaler, Current Smoker,    breath sounds clear to auscultation       Cardiovascular hypertension, Pt. on medications (-) angina Rhythm:Regular Rate:Normal  1/7 ECHO:  EF 60-65%, valves OK   Neuro/Psych negative neurological ROS     GI/Hepatic Neg liver ROS, GERD  Controlled,  Endo/Other  diabetes (glu 96), Oral Hypoglycemic Agents, Insulin Dependent  Renal/GU ESRF and DialysisRenal disease (MWF, K+ 4.0)   Prostate cancer    Musculoskeletal   Abdominal   Peds  Hematology  (+) Blood dyscrasia (Hb 10.5), anemia ,   Anesthesia Other Findings   Reproductive/Obstetrics                            Anesthesia Physical Anesthesia Plan  ASA: III  Anesthesia Plan: MAC   Post-op Pain Management:    Induction:   Airway Management Planned: Natural Airway  Additional Equipment:   Intra-op Plan:   Post-operative Plan:   Informed Consent: I have reviewed the patients History and Physical, chart, labs and discussed the procedure including the risks, benefits and alternatives for the proposed anesthesia with the patient or authorized representative who has indicated his/her understanding and acceptance.   Dental advisory given  Plan Discussed with: CRNA and Surgeon  Anesthesia Plan Comments: (Plan routine monitors, MAC)        Anesthesia Quick Evaluation

## 2016-02-18 ENCOUNTER — Encounter (HOSPITAL_COMMUNITY)
Admission: RE | Disposition: A | Discharge status: Home / Self Care | Payer: Self-pay | Source: Ambulatory Visit | Attending: Vascular Surgery

## 2016-02-18 ENCOUNTER — Encounter (HOSPITAL_COMMUNITY): Payer: Self-pay | Admitting: Certified Registered"

## 2016-02-18 ENCOUNTER — Ambulatory Visit (HOSPITAL_COMMUNITY)
Admission: RE | Admit: 2016-02-18 | Discharge: 2016-02-18 | Disposition: A | Discharge status: Home / Self Care | Payer: Medicare Other | Source: Ambulatory Visit | Attending: Vascular Surgery | Admitting: Vascular Surgery

## 2016-02-18 ENCOUNTER — Ambulatory Visit (HOSPITAL_COMMUNITY): Payer: Medicare Other | Admitting: Anesthesiology

## 2016-02-18 ENCOUNTER — Telehealth: Payer: Self-pay | Admitting: *Deleted

## 2016-02-18 ENCOUNTER — Other Ambulatory Visit: Payer: Self-pay | Admitting: *Deleted

## 2016-02-18 DIAGNOSIS — E1122 Type 2 diabetes mellitus with diabetic chronic kidney disease: Secondary | ICD-10-CM | POA: Insufficient documentation

## 2016-02-18 DIAGNOSIS — N186 End stage renal disease: Secondary | ICD-10-CM | POA: Insufficient documentation

## 2016-02-18 DIAGNOSIS — Z7984 Long term (current) use of oral hypoglycemic drugs: Secondary | ICD-10-CM | POA: Insufficient documentation

## 2016-02-18 DIAGNOSIS — I120 Hypertensive chronic kidney disease with stage 5 chronic kidney disease or end stage renal disease: Secondary | ICD-10-CM | POA: Insufficient documentation

## 2016-02-18 DIAGNOSIS — J449 Chronic obstructive pulmonary disease, unspecified: Secondary | ICD-10-CM | POA: Insufficient documentation

## 2016-02-18 DIAGNOSIS — E7800 Pure hypercholesterolemia, unspecified: Secondary | ICD-10-CM | POA: Diagnosis not present

## 2016-02-18 DIAGNOSIS — Z79899 Other long term (current) drug therapy: Secondary | ICD-10-CM | POA: Insufficient documentation

## 2016-02-18 DIAGNOSIS — Z8546 Personal history of malignant neoplasm of prostate: Secondary | ICD-10-CM | POA: Diagnosis not present

## 2016-02-18 DIAGNOSIS — F17210 Nicotine dependence, cigarettes, uncomplicated: Secondary | ICD-10-CM | POA: Insufficient documentation

## 2016-02-18 DIAGNOSIS — Z4931 Encounter for adequacy testing for hemodialysis: Secondary | ICD-10-CM

## 2016-02-18 DIAGNOSIS — N185 Chronic kidney disease, stage 5: Secondary | ICD-10-CM | POA: Diagnosis not present

## 2016-02-18 HISTORY — PX: AV FISTULA PLACEMENT: SHX1204

## 2016-02-18 HISTORY — DX: Gastro-esophageal reflux disease without esophagitis: K21.9

## 2016-02-18 LAB — GLUCOSE, CAPILLARY: Glucose-Capillary: 96 mg/dL (ref 65–99)

## 2016-02-18 LAB — SURGICAL PCR SCREEN
MRSA, PCR: NEGATIVE
STAPHYLOCOCCUS AUREUS: NEGATIVE

## 2016-02-18 LAB — POCT I-STAT 4, (NA,K, GLUC, HGB,HCT)
Glucose, Bld: 96 mg/dL (ref 65–99)
HEMATOCRIT: 31 % — AB (ref 39.0–52.0)
Hemoglobin: 10.5 g/dL — ABNORMAL LOW (ref 13.0–17.0)
Potassium: 4 mmol/L (ref 3.5–5.1)
Sodium: 136 mmol/L (ref 135–145)

## 2016-02-18 SURGERY — ARTERIOVENOUS (AV) FISTULA CREATION
Anesthesia: Monitor Anesthesia Care | Site: Arm Lower | Laterality: Right

## 2016-02-18 MED ORDER — MIDAZOLAM HCL 2 MG/2ML IJ SOLN
INTRAMUSCULAR | Status: AC
  Filled 2016-02-18: qty 2

## 2016-02-18 MED ORDER — PROPOFOL 10 MG/ML IV BOLUS
INTRAVENOUS | Status: DC | PRN
  Administered 2016-02-18: 20 mg via INTRAVENOUS

## 2016-02-18 MED ORDER — SODIUM CHLORIDE 0.9 % IV SOLN
INTRAVENOUS | Status: DC
  Administered 2016-02-18: 07:00:00 via INTRAVENOUS

## 2016-02-18 MED ORDER — LIDOCAINE HCL 1 % IJ SOLN
INTRAMUSCULAR | Status: DC | PRN
  Administered 2016-02-18: 4 mL

## 2016-02-18 MED ORDER — CHLORHEXIDINE GLUCONATE CLOTH 2 % EX PADS: 6.0000 | MEDICATED_PAD | Freq: Once | CUTANEOUS | Status: DC

## 2016-02-18 MED ORDER — MUPIROCIN 2 % EX OINT
1.0000 "application " | TOPICAL_OINTMENT | Freq: Once | CUTANEOUS | Status: AC
  Administered 2016-02-18: 1 via TOPICAL

## 2016-02-18 MED ORDER — FENTANYL CITRATE (PF) 100 MCG/2ML IJ SOLN: 25.0000 ug | INTRAMUSCULAR | Status: DC | PRN

## 2016-02-18 MED ORDER — PROMETHAZINE HCL 25 MG/ML IJ SOLN: 6.2500 mg | INTRAMUSCULAR | Status: DC | PRN

## 2016-02-18 MED ORDER — DEXTROSE 5 % IV SOLN
1.5000 g | INTRAVENOUS | Status: AC
  Administered 2016-02-18: 1.5 g via INTRAVENOUS

## 2016-02-18 MED ORDER — MIDAZOLAM HCL 5 MG/5ML IJ SOLN
INTRAMUSCULAR | Status: DC | PRN
  Administered 2016-02-18: 2 mg via INTRAVENOUS

## 2016-02-18 MED ORDER — FENTANYL CITRATE (PF) 100 MCG/2ML IJ SOLN
INTRAMUSCULAR | Status: DC | PRN
  Administered 2016-02-18: 50 ug via INTRAVENOUS

## 2016-02-18 MED ORDER — MIDAZOLAM HCL 2 MG/2ML IJ SOLN: 0.5000 mg | Freq: Once | INTRAMUSCULAR | Status: DC | PRN

## 2016-02-18 MED ORDER — 0.9 % SODIUM CHLORIDE (POUR BTL) OPTIME
TOPICAL | Status: DC | PRN
  Administered 2016-02-18: 1000 mL

## 2016-02-18 MED ORDER — PROPOFOL 10 MG/ML IV BOLUS
INTRAVENOUS | Status: AC
  Filled 2016-02-18: qty 20

## 2016-02-18 MED ORDER — PROPOFOL 500 MG/50ML IV EMUL
INTRAVENOUS | Status: DC | PRN
  Administered 2016-02-18: 75 ug/kg/min via INTRAVENOUS

## 2016-02-18 MED ORDER — MUPIROCIN 2 % EX OINT
TOPICAL_OINTMENT | CUTANEOUS | Status: AC
  Administered 2016-02-18: 1 via TOPICAL
  Filled 2016-02-18: qty 22

## 2016-02-18 MED ORDER — FENTANYL CITRATE (PF) 100 MCG/2ML IJ SOLN
INTRAMUSCULAR | Status: AC
  Filled 2016-02-18: qty 2

## 2016-02-18 MED ORDER — MEPERIDINE HCL 25 MG/ML IJ SOLN: 6.2500 mg | INTRAMUSCULAR | Status: DC | PRN

## 2016-02-18 MED ORDER — OXYCODONE-ACETAMINOPHEN 5-325 MG PO TABS: 1.0000 | ORAL_TABLET | Freq: Four times a day (QID) | ORAL | 0 refills | Status: DC | PRN

## 2016-02-18 MED ORDER — HEPARIN SODIUM (PORCINE) 1000 UNIT/ML IJ SOLN
2.1000 mL | Freq: Once | INTRAMUSCULAR | Status: AC
  Administered 2016-02-18: 2100 [IU] via INTRAVENOUS
  Filled 2016-02-18: qty 2.1

## 2016-02-18 MED ORDER — DEXTROSE 5 % IV SOLN
INTRAVENOUS | Status: AC
  Filled 2016-02-18: qty 1.5

## 2016-02-18 MED ORDER — LIDOCAINE HCL (PF) 1 % IJ SOLN
INTRAMUSCULAR | Status: AC
  Filled 2016-02-18: qty 30

## 2016-02-18 MED ORDER — LIDOCAINE 2% (20 MG/ML) 5 ML SYRINGE
INTRAMUSCULAR | Status: AC
  Filled 2016-02-18: qty 5

## 2016-02-18 MED ORDER — SODIUM CHLORIDE 0.9 % IV SOLN
INTRAVENOUS | Status: DC | PRN
  Administered 2016-02-18: 07:00:00

## 2016-02-18 SURGICAL SUPPLY — 25 items
ARMBAND PINK RESTRICT EXTREMIT (MISCELLANEOUS) ×3 IMPLANT
CANISTER SUCTION 2500CC (MISCELLANEOUS) ×3 IMPLANT
CLIP TI MEDIUM 6 (CLIP) ×3 IMPLANT
CLIP TI WIDE RED SMALL 6 (CLIP) ×3 IMPLANT
COVER PROBE W GEL 5X96 (DRAPES) IMPLANT
DERMABOND ADVANCED (GAUZE/BANDAGES/DRESSINGS) ×2
DERMABOND ADVANCED .7 DNX12 (GAUZE/BANDAGES/DRESSINGS) ×1 IMPLANT
ELECT REM PT RETURN 9FT ADLT (ELECTROSURGICAL) ×3
ELECTRODE REM PT RTRN 9FT ADLT (ELECTROSURGICAL) ×1 IMPLANT
GLOVE BIO SURGEON STRL SZ7.5 (GLOVE) ×3 IMPLANT
GOWN STRL REUS W/ TWL LRG LVL3 (GOWN DISPOSABLE) ×2 IMPLANT
GOWN STRL REUS W/ TWL XL LVL3 (GOWN DISPOSABLE) ×1 IMPLANT
GOWN STRL REUS W/TWL LRG LVL3 (GOWN DISPOSABLE) ×4
GOWN STRL REUS W/TWL XL LVL3 (GOWN DISPOSABLE) ×2
KIT BASIN OR (CUSTOM PROCEDURE TRAY) ×3 IMPLANT
KIT ROOM TURNOVER OR (KITS) ×3 IMPLANT
NS IRRIG 1000ML POUR BTL (IV SOLUTION) ×3 IMPLANT
PACK CV ACCESS (CUSTOM PROCEDURE TRAY) ×3 IMPLANT
PAD ARMBOARD 7.5X6 YLW CONV (MISCELLANEOUS) ×6 IMPLANT
SUT MNCRL AB 4-0 PS2 18 (SUTURE) ×3 IMPLANT
SUT PROLENE 6 0 BV (SUTURE) ×3 IMPLANT
SUT VIC AB 3-0 SH 27 (SUTURE) ×2
SUT VIC AB 3-0 SH 27X BRD (SUTURE) ×1 IMPLANT
UNDERPAD 30X30 (UNDERPADS AND DIAPERS) ×3 IMPLANT
WATER STERILE IRR 1000ML POUR (IV SOLUTION) ×3 IMPLANT

## 2016-02-18 NOTE — Op Note (Signed)
    OPERATIVE NOTE   PROCEDURE: right radiocephalic arteriovenous fistula placement  PRE-OPERATIVE DIAGNOSIS: esrd  POST-OPERATIVE DIAGNOSIS: same  SURGEON: Roald Lukacs C. Donzetta Matters, MD  ANESTHESIA: local and MAC  ESTIMATED BLOOD LOSS: 20 cc  FINDING(S): Vein dilated to 3.5 mm, 2.5 mm radial artery  SPECIMEN(S):  none  INDICATIONS:   [name] is a 66 y.o. male who presents with esrd.  The patient is scheduled for right radiocephalic arteriovenous fistula placement.  The patient is aware the risks include but are not limited to: bleeding, infection, steal syndrome, nerve damage, ischemic monomelic neuropathy, failure to mature, and need for additional procedures.  The patient is aware of the risks of the procedure and elects to proceed forward.  DESCRIPTION: After full informed written consent was obtained from the patient, the patient was brought back to the operating room and placed supine upon the operating table.  Prior to induction, the patient received IV antibiotics.   After obtaining adequate anesthesia, the patient was then prepped and draped in the standard fashion for a right arm access procedure.  I turned my attention first to identifying the patient's cephalic vein and radial artery.  Using SonoSite guidance, the location of these vessels were marked out on the skin.   At this point, I injected local anesthetic to obtain a field block of the antecubitum.  In total, I injected about 4 mL of 1% lidocaine plain. A longitudinal incision was created and the vein dissected for several cm's. The fascia was then incised and radial artery dissected and vessel loop placed around it. The vein was then divided, dilated to 3.50m and flushed with heparinized saline. The artery was clamped, opened longitudinally and similarly flushed proximally and distally. The vein was then spatulated and sewn end to side with 6-0 prolene suture. Prior to releasing the clamps flushing maneuvers were undertaken.  The anastomosis was completed and doppler used to identify continuous flow in the runoff vein. There was a palpable radial pulse that augmented with compression of the fistula. Hemostasis was obtained and the subcutaneous tissue was reapproximated with a running stitch of 3-0 Vicryl.  The skin was then reapproximated with a running subcuticular stitch of 4-0 Vicryl.  The skin was then cleaned, dried, and reinforced with Dermabond.  The patient tolerated this procedure well.   COMPLICATIONS: none immediate  CONDITION: stable for discharge   [name] C. CDonzetta Matters MD [facility] [place]: [phone]: [phone] 02/18/2016, 8:44 AM

## 2016-02-18 NOTE — Transfer of Care (Signed)
Immediate Anesthesia Transfer of Care Note  Patient: [name]  Procedure(s) Performed: Procedure(s): RADIOCEPHALIC ARTERIOVENOUS (AV) FISTULA CREATION (Right)  Patient Location: PACU  Anesthesia Type:MAC  Level of Consciousness: awake, oriented and patient cooperative  Airway & Oxygen Therapy: Patient Spontanous Breathing  Post-op Assessment: Report given to RN, Post -op Vital signs reviewed and stable and Patient moving all extremities  Post vital signs: Reviewed and stable  Last Vitals:  Vitals:   02/18/16 0624  BP: (!) 155/72  Pulse: 99  Resp: 20  Temp: 37.2 C    Last Pain: There were no vitals filed for this visit.    Patients Stated Pain Goal: 3 (38/88/75 7972)  Complications: No apparent anesthesia complications

## 2016-02-18 NOTE — Telephone Encounter (Signed)
[name], pharmacist at [facility] #[id number], called [name] to report that this patient is currently getting Hydrocodone 5-325mg  from the [facility] per Dr. Lucille Passy. Patient received his monthly prescription of 60 tablets yesterday. Patient just presented a Rx for postop Oxycodone 5-325mg  #30 1-2 tablets q 6 hours prn; he had right radiocephalic AVF creation this morning by Dr. Donzetta Matters.   I ran the patient info in the [place] and it appears that this patient is on a contract for pain medications with Dr. Waldemar Dickens. He has gotten monthly prescriptions of #60 tablets for at least the last 6 months.   With the patient just receiving his Hydrocodone yesterday, I have instructed the pharmacist not to fill our Rx from this morning. She will dispose of the prescription . She will instruct the patient to call me if he has any questions.

## 2016-02-18 NOTE — Addendum Note (Signed)
Addendum  created 02/18/16 1002 by Annye Asa, MD   Visit Navigator Flowsheet section accepted

## 2016-02-18 NOTE — Anesthesia Procedure Notes (Signed)
Procedure Name: MAC Date/Time: 02/18/2016 7:30 AM Performed by: Melina Copa, Catha Ontko R Pre-anesthesia Checklist: Patient identified, Emergency Drugs available, Suction available, Patient being monitored and Timeout performed Oxygen Delivery Method: Nasal cannula Placement Confirmation: positive ETCO2 Dental Injury: Teeth and Oropharynx as per pre-operative assessment

## 2016-02-18 NOTE — Progress Notes (Signed)
PATIENT STATED THAT HE DOES NOT HAVE ANYONE TO STAY WITH HIM AFTER HIS SURGERY TODAY.

## 2016-02-18 NOTE — Anesthesia Postprocedure Evaluation (Addendum)
Anesthesia Post Note  Patient: [name]  Procedure(s) Performed: Procedure(s) (LRB): RADIOCEPHALIC ARTERIOVENOUS (AV) FISTULA CREATION (Right)  Patient location during evaluation: PACU Anesthesia Type: MAC Level of consciousness: awake and alert, patient cooperative and oriented Pain management: pain level controlled Vital Signs Assessment: post-procedure vital signs reviewed and stable Respiratory status: spontaneous breathing, nonlabored ventilation and respiratory function stable Cardiovascular status: blood pressure returned to baseline and stable Postop Assessment: no signs of nausea or vomiting Anesthetic complications: no       Last Vitals:  Vitals:   02/18/16 0915 02/18/16 0918  BP: 128/72 (!) 160/79  Pulse: 91 91  Resp: 16   Temp: 37.2 C     Last Pain:  Vitals:   02/18/16 0918  PainSc: 0-No pain                 Marvine Encalade,E. Japhet Morgenthaler

## 2016-02-18 NOTE — H&P (Signed)
     History and Physical Update  The patient was interviewed and re-examined.  The patient's previous History and Physical has been reviewed and is unchanged.  There is no change in the plan of care other than patient does not have anyone to be with him tonight and will need overnight observation.   Devani Odonnel C. Donzetta Matters, MD [facility] Vein Specialists of [place] Office: [phone] Pager: [phone]   02/18/2016, 7:16 AM

## 2016-02-19 ENCOUNTER — Encounter (HOSPITAL_COMMUNITY): Payer: Self-pay | Admitting: Vascular Surgery

## 2016-02-19 DIAGNOSIS — E1129 Type 2 diabetes mellitus with other diabetic kidney complication: Secondary | ICD-10-CM | POA: Diagnosis not present

## 2016-02-28 ENCOUNTER — Emergency Department (HOSPITAL_COMMUNITY): Payer: Medicare Other

## 2016-02-28 ENCOUNTER — Inpatient Hospital Stay (HOSPITAL_COMMUNITY)
Admission: EM | Admit: 2016-02-28 | Discharge: 2016-03-05 | DRG: 190 | Disposition: A | Discharge status: Home / Self Care | Payer: Medicare Other | Attending: Family Medicine | Admitting: Family Medicine

## 2016-02-28 ENCOUNTER — Encounter (HOSPITAL_COMMUNITY): Payer: Self-pay | Admitting: Emergency Medicine

## 2016-02-28 DIAGNOSIS — E1122 Type 2 diabetes mellitus with diabetic chronic kidney disease: Secondary | ICD-10-CM | POA: Diagnosis present

## 2016-02-28 DIAGNOSIS — E872 Acidosis: Secondary | ICD-10-CM | POA: Diagnosis present

## 2016-02-28 DIAGNOSIS — Z96643 Presence of artificial hip joint, bilateral: Secondary | ICD-10-CM | POA: Diagnosis present

## 2016-02-28 DIAGNOSIS — D631 Anemia in chronic kidney disease: Secondary | ICD-10-CM | POA: Diagnosis present

## 2016-02-28 DIAGNOSIS — Z992 Dependence on renal dialysis: Secondary | ICD-10-CM | POA: Diagnosis not present

## 2016-02-28 DIAGNOSIS — I120 Hypertensive chronic kidney disease with stage 5 chronic kidney disease or end stage renal disease: Secondary | ICD-10-CM | POA: Diagnosis not present

## 2016-02-28 DIAGNOSIS — R0603 Acute respiratory distress: Secondary | ICD-10-CM

## 2016-02-28 DIAGNOSIS — R0902 Hypoxemia: Secondary | ICD-10-CM | POA: Diagnosis present

## 2016-02-28 DIAGNOSIS — J441 Chronic obstructive pulmonary disease with (acute) exacerbation: Secondary | ICD-10-CM | POA: Diagnosis present

## 2016-02-28 DIAGNOSIS — I5032 Chronic diastolic (congestive) heart failure: Secondary | ICD-10-CM | POA: Diagnosis present

## 2016-02-28 DIAGNOSIS — M898X9 Other specified disorders of bone, unspecified site: Secondary | ICD-10-CM | POA: Diagnosis present

## 2016-02-28 DIAGNOSIS — R339 Retention of urine, unspecified: Secondary | ICD-10-CM | POA: Diagnosis present

## 2016-02-28 DIAGNOSIS — I132 Hypertensive heart and chronic kidney disease with heart failure and with stage 5 chronic kidney disease, or end stage renal disease: Secondary | ICD-10-CM | POA: Diagnosis present

## 2016-02-28 DIAGNOSIS — Z7984 Long term (current) use of oral hypoglycemic drugs: Secondary | ICD-10-CM | POA: Diagnosis not present

## 2016-02-28 DIAGNOSIS — J111 Influenza due to unidentified influenza virus with other respiratory manifestations: Secondary | ICD-10-CM

## 2016-02-28 DIAGNOSIS — R0602 Shortness of breath: Secondary | ICD-10-CM | POA: Diagnosis present

## 2016-02-28 DIAGNOSIS — N2581 Secondary hyperparathyroidism of renal origin: Secondary | ICD-10-CM | POA: Diagnosis not present

## 2016-02-28 DIAGNOSIS — Z79899 Other long term (current) drug therapy: Secondary | ICD-10-CM | POA: Diagnosis not present

## 2016-02-28 DIAGNOSIS — N186 End stage renal disease: Secondary | ICD-10-CM | POA: Diagnosis not present

## 2016-02-28 DIAGNOSIS — Z96611 Presence of right artificial shoulder joint: Secondary | ICD-10-CM | POA: Diagnosis present

## 2016-02-28 DIAGNOSIS — F17210 Nicotine dependence, cigarettes, uncomplicated: Secondary | ICD-10-CM | POA: Diagnosis present

## 2016-02-28 LAB — BASIC METABOLIC PANEL
Anion gap: 13 (ref 5–15)
BUN: 36 mg/dL — ABNORMAL HIGH (ref 6–20)
CHLORIDE: 99 mmol/L — AB (ref 101–111)
CO2: 23 mmol/L (ref 22–32)
Calcium: 8.2 mg/dL — ABNORMAL LOW (ref 8.9–10.3)
Creatinine, Ser: 6.78 mg/dL — ABNORMAL HIGH (ref 0.61–1.24)
GFR calc non Af Amer: 7 mL/min — ABNORMAL LOW (ref >60)
GFR, EST AFRICAN AMERICAN: 9 mL/min — AB (ref >60)
Glucose, Bld: 182 mg/dL — ABNORMAL HIGH (ref 65–99)
POTASSIUM: 3.9 mmol/L (ref 3.5–5.1)
SODIUM: 135 mmol/L (ref 135–145)

## 2016-02-28 LAB — I-STAT CHEM 8, ED
BUN: 35 mg/dL — ABNORMAL HIGH (ref 6–20)
CHLORIDE: 99 mmol/L — AB (ref 101–111)
Calcium, Ion: 1 mmol/L — ABNORMAL LOW (ref 1.15–1.40)
Creatinine, Ser: 7.1 mg/dL — ABNORMAL HIGH (ref 0.61–1.24)
Glucose, Bld: 181 mg/dL — ABNORMAL HIGH (ref 65–99)
HEMATOCRIT: 37 % — AB (ref 39.0–52.0)
Hemoglobin: 12.6 g/dL — ABNORMAL LOW (ref 13.0–17.0)
POTASSIUM: 3.8 mmol/L (ref 3.5–5.1)
SODIUM: 133 mmol/L — AB (ref 135–145)
TCO2: 23 mmol/L (ref 0–100)

## 2016-02-28 LAB — INFLUENZA PANEL BY PCR (TYPE A & B)
Influenza A By PCR: POSITIVE — AB
Influenza B By PCR: NEGATIVE

## 2016-02-28 LAB — CBC WITH DIFFERENTIAL/PLATELET
BASOS ABS: 0 10*3/uL (ref 0.0–0.1)
Basophils Relative: 1 %
Eosinophils Absolute: 0 10*3/uL (ref 0.0–0.7)
Eosinophils Relative: 0 %
HEMATOCRIT: 36.1 % — AB (ref 39.0–52.0)
Hemoglobin: 11.7 g/dL — ABNORMAL LOW (ref 13.0–17.0)
LYMPHS ABS: 1.2 10*3/uL (ref 0.7–4.0)
LYMPHS PCT: 18 %
MCH: 32.4 pg (ref 26.0–34.0)
MCHC: 32.4 g/dL (ref 30.0–36.0)
MCV: 100 fL (ref 78.0–100.0)
Monocytes Absolute: 0.8 10*3/uL (ref 0.1–1.0)
Monocytes Relative: 12 %
NEUTROS ABS: 4.8 10*3/uL (ref 1.7–7.7)
Neutrophils Relative %: 69 %
Platelets: 294 10*3/uL (ref 150–400)
RBC: 3.61 MIL/uL — AB (ref 4.22–5.81)
RDW: 16.4 % — ABNORMAL HIGH (ref 11.5–15.5)
WBC: 6.9 10*3/uL (ref 4.0–10.5)

## 2016-02-28 LAB — GLUCOSE, CAPILLARY: Glucose-Capillary: 312 mg/dL — ABNORMAL HIGH (ref 65–99)

## 2016-02-28 LAB — CREATININE, SERUM
Creatinine, Ser: 7.12 mg/dL — ABNORMAL HIGH (ref 0.61–1.24)
GFR calc non Af Amer: 7 mL/min — ABNORMAL LOW (ref >60)
GFR, EST AFRICAN AMERICAN: 8 mL/min — AB (ref >60)

## 2016-02-28 LAB — CBC
HCT: 36.1 % — ABNORMAL LOW (ref 39.0–52.0)
Hemoglobin: 11.4 g/dL — ABNORMAL LOW (ref 13.0–17.0)
MCH: 32.1 pg (ref 26.0–34.0)
MCHC: 31.6 g/dL (ref 30.0–36.0)
MCV: 101.7 fL — AB (ref 78.0–100.0)
PLATELETS: 297 10*3/uL (ref 150–400)
RBC: 3.55 MIL/uL — ABNORMAL LOW (ref 4.22–5.81)
RDW: 16.4 % — AB (ref 11.5–15.5)
WBC: 7.7 10*3/uL (ref 4.0–10.5)

## 2016-02-28 LAB — BLOOD GAS, ARTERIAL
ACID-BASE DEFICIT: 2.7 mmol/L — AB (ref 0.0–2.0)
BICARBONATE: 23.2 mmol/L (ref 20.0–28.0)
Drawn by: 28338
FIO2: 0.28
O2 SAT: 89.9 %
PCO2 ART: 52.4 mmHg — AB (ref 32.0–48.0)
Patient temperature: 98.6
pH, Arterial: 7.269 — ABNORMAL LOW (ref 7.350–7.450)
pO2, Arterial: 68.2 mmHg — ABNORMAL LOW (ref 83.0–108.0)

## 2016-02-28 LAB — LACTIC ACID, PLASMA: Lactic Acid, Venous: 2.1 mmol/L (ref 0.5–1.9)

## 2016-02-28 MED ORDER — SODIUM CHLORIDE 0.9% FLUSH
3.0000 mL | Freq: Two times a day (BID) | INTRAVENOUS | Status: DC
  Administered 2016-02-29 – 2016-03-05 (×10): 3 mL via INTRAVENOUS

## 2016-02-28 MED ORDER — LEVOFLOXACIN IN D5W 500 MG/100ML IV SOLN
500.0000 mg | INTRAVENOUS | Status: DC
  Administered 2016-02-28: 500 mg via INTRAVENOUS
  Filled 2016-02-28: qty 100

## 2016-02-28 MED ORDER — ALBUTEROL (5 MG/ML) CONTINUOUS INHALATION SOLN: 10.0000 mg/h | INHALATION_SOLUTION | Freq: Once | RESPIRATORY_TRACT | Status: DC

## 2016-02-28 MED ORDER — SODIUM CHLORIDE 0.9 % IV SOLN
INTRAVENOUS | Status: DC
  Administered 2016-02-28: 22:00:00 via INTRAVENOUS

## 2016-02-28 MED ORDER — AMLODIPINE BESYLATE 10 MG PO TABS
10.0000 mg | ORAL_TABLET | Freq: Every day | ORAL | Status: DC
  Administered 2016-02-29 – 2016-03-05 (×6): 10 mg via ORAL
  Filled 2016-02-28 (×3): qty 1
  Filled 2016-02-28: qty 2
  Filled 2016-02-28 (×3): qty 1

## 2016-02-28 MED ORDER — INSULIN ASPART 100 UNIT/ML ~~LOC~~ SOLN
0.0000 [IU] | Freq: Three times a day (TID) | SUBCUTANEOUS | Status: DC
  Administered 2016-02-29 – 2016-03-03 (×5): 2 [IU] via SUBCUTANEOUS
  Administered 2016-03-04: 3 [IU] via SUBCUTANEOUS
  Administered 2016-03-05: 2 [IU] via SUBCUTANEOUS

## 2016-02-28 MED ORDER — INSULIN ASPART 100 UNIT/ML ~~LOC~~ SOLN
0.0000 [IU] | Freq: Every day | SUBCUTANEOUS | Status: DC
  Administered 2016-02-28: 4 [IU] via SUBCUTANEOUS
  Administered 2016-02-29 – 2016-03-04 (×3): 2 [IU] via SUBCUTANEOUS

## 2016-02-28 MED ORDER — PREDNISONE 20 MG PO TABS: 20.0000 mg | ORAL_TABLET | Freq: Two times a day (BID) | ORAL | 0 refills | Status: DC

## 2016-02-28 MED ORDER — FUROSEMIDE 20 MG PO TABS
20.0000 mg | ORAL_TABLET | Freq: Every day | ORAL | Status: DC
  Administered 2016-02-29: 20 mg via ORAL
  Filled 2016-02-28: qty 1

## 2016-02-28 MED ORDER — ALBUTEROL SULFATE HFA 108 (90 BASE) MCG/ACT IN AERS: 1.0000 | INHALATION_SPRAY | Freq: Four times a day (QID) | RESPIRATORY_TRACT | 0 refills | Status: DC | PRN

## 2016-02-28 MED ORDER — ACETAMINOPHEN 650 MG RE SUPP: 650.0000 mg | Freq: Four times a day (QID) | RECTAL | Status: DC | PRN

## 2016-02-28 MED ORDER — ALBUTEROL SULFATE (2.5 MG/3ML) 0.083% IN NEBU
5.0000 mg | INHALATION_SOLUTION | Freq: Once | RESPIRATORY_TRACT | Status: AC
  Administered 2016-02-28: 5 mg via RESPIRATORY_TRACT
  Filled 2016-02-28: qty 6

## 2016-02-28 MED ORDER — OSELTAMIVIR PHOSPHATE 30 MG PO CAPS
30.0000 mg | ORAL_CAPSULE | Freq: Once | ORAL | Status: AC
  Administered 2016-02-28: 30 mg via ORAL
  Filled 2016-02-28: qty 1

## 2016-02-28 MED ORDER — HYDROCOD POLST-CPM POLST ER 10-8 MG/5ML PO SUER: 5.0000 mL | Freq: Two times a day (BID) | ORAL | 0 refills | Status: DC

## 2016-02-28 MED ORDER — HYDRALAZINE HCL 20 MG/ML IJ SOLN
5.0000 mg | INTRAMUSCULAR | Status: DC | PRN
  Administered 2016-02-28: 5 mg via INTRAVENOUS
  Filled 2016-02-28: qty 1

## 2016-02-28 MED ORDER — ATORVASTATIN CALCIUM 10 MG PO TABS
10.0000 mg | ORAL_TABLET | Freq: Every day | ORAL | Status: DC
  Administered 2016-02-29 – 2016-03-05 (×6): 10 mg via ORAL
  Filled 2016-02-28 (×6): qty 1

## 2016-02-28 MED ORDER — OSELTAMIVIR PHOSPHATE 30 MG PO CAPS
30.0000 mg | ORAL_CAPSULE | Freq: Every day | ORAL | Status: AC
  Administered 2016-02-28 – 2016-03-02 (×4): 30 mg via ORAL
  Filled 2016-02-28 (×4): qty 1

## 2016-02-28 MED ORDER — IPRATROPIUM-ALBUTEROL 0.5-2.5 (3) MG/3ML IN SOLN
3.0000 mL | Freq: Three times a day (TID) | RESPIRATORY_TRACT | Status: DC
  Administered 2016-02-29 (×2): 3 mL via RESPIRATORY_TRACT
  Filled 2016-02-28 (×2): qty 3

## 2016-02-28 MED ORDER — ALBUTEROL SULFATE (2.5 MG/3ML) 0.083% IN NEBU
2.5000 mg | INHALATION_SOLUTION | RESPIRATORY_TRACT | Status: DC | PRN
  Administered 2016-03-02 – 2016-03-03 (×2): 2.5 mg via RESPIRATORY_TRACT
  Filled 2016-02-28 (×2): qty 3

## 2016-02-28 MED ORDER — HEPARIN SODIUM (PORCINE) 5000 UNIT/ML IJ SOLN
5000.0000 [IU] | Freq: Three times a day (TID) | INTRAMUSCULAR | Status: DC
  Administered 2016-02-28 – 2016-03-03 (×11): 5000 [IU] via SUBCUTANEOUS
  Filled 2016-02-28 (×12): qty 1

## 2016-02-28 MED ORDER — IPRATROPIUM-ALBUTEROL 0.5-2.5 (3) MG/3ML IN SOLN
3.0000 mL | Freq: Four times a day (QID) | RESPIRATORY_TRACT | Status: DC
  Administered 2016-02-28: 3 mL via RESPIRATORY_TRACT
  Filled 2016-02-28: qty 3

## 2016-02-28 MED ORDER — METHYLPREDNISOLONE SODIUM SUCC 125 MG IJ SOLR
125.0000 mg | Freq: Once | INTRAMUSCULAR | Status: AC
  Administered 2016-02-28: 125 mg via INTRAVENOUS
  Filled 2016-02-28: qty 2

## 2016-02-28 MED ORDER — ALBUTEROL SULFATE HFA 108 (90 BASE) MCG/ACT IN AERS: 2.0000 | INHALATION_SPRAY | RESPIRATORY_TRACT | Status: DC | PRN

## 2016-02-28 MED ORDER — ACETAMINOPHEN 325 MG PO TABS: 650.0000 mg | ORAL_TABLET | Freq: Four times a day (QID) | ORAL | Status: DC | PRN

## 2016-02-28 MED ORDER — PREDNISONE 20 MG PO TABS
40.0000 mg | ORAL_TABLET | Freq: Every day | ORAL | Status: DC
  Administered 2016-02-29 – 2016-03-05 (×6): 40 mg via ORAL
  Filled 2016-02-28 (×6): qty 2

## 2016-02-28 MED ORDER — TAMSULOSIN HCL 0.4 MG PO CAPS
0.4000 mg | ORAL_CAPSULE | Freq: Every day | ORAL | Status: DC
  Administered 2016-02-29 – 2016-03-05 (×6): 0.4 mg via ORAL
  Filled 2016-02-28 (×6): qty 1

## 2016-02-28 NOTE — H&P (Signed)
[facility] Admission History and Physical Service Pager: [phone]  Patient name: [name] Medical record number: [id number] Date of birth: 1950/04/08 Age: 66 y.o. Gender: male  Primary Care Provider: [facility] Consultants: nephrology Code Status: Full  Chief Complaint: cough, shortness of breath and increased sputum production  Assessment and Plan: [name] is a 67 y.o. male presenting with cough, shortness of breath and increased sputum production. PMH is significant for ESRD, HTN, DM, COPD, tobacco use  Respiratory distress- No evidence of respiratory failure. Blood oxygen levels in the los 90s on room air on presentation, this improved with nasal cannula to 98%. Shortness of breath is likely mulifactorial given history of COPD with continued smoking as well as + Influenza. While he did not have a fever here, his temperature is elevated to 99.4. WBC 6.9. Lactic acid 2.1. qSOFA is 1 for tachypnea to 26. While there is concern for development of sepsis with this score, he does not clinically appear to be septic at this time.  - admit to telemetry, Dr Andria Frames attending - supplemental oxygen as needed - will continue tamilfu- renally dosed at 30 qD x5 days - droplet precautions - follow ABG  COPD- concern for exacerbation as above - prednisone 40 qD x5 days - levoquin dosed per pharm x 5 days, given degree of shortness of breath on presentation and medical co-morbidities - duonebs q6, abluterol q2 prn - supplemental O2 as above  ESRD- missed dialysis today. No evidence of fluid overload on exam, K 3.9, Na 135 - nephrology consult, anticipate dialysis in the AM - follow AM renal function panel - continue home lasix 20 mg qD  Diabetes- no A1c in epic, on glipizide at home - sensitive SSI - ACHS CBGs - will order A1c  HTN- blood pressure stable in the ED - will continue home norvasc 10, lasix 20 mg qD - hydral 5 mg IV PRN  Urinary  retention - continue tamsulosin  FEN/GI: carb/heart healthy, IVF 75 cc/hr Prophylaxis: heparin  Disposition: pending clinical improvement  History of Present Illness:  [name] is a 66 y.o. male presenting with cough, shortness of breath and increased sputum production  He started to have tactile fevers yesterday with cough, increased sputum production and rhinorrea. He also reports shortness of breath that started at that time and continued to worsen prompting him to come to the ED today. He denies chest pain. He reports some nausea during that time but no emesis.  He does have ESRD and gets dialysis M/W/F. He did not go to dialysis today as he felt unwell. He denies missing dialysis on a regular basis.  He also reports a history of COPD and while he does not use oxygen at home, he reports needing oxygen at dialysis as his blood oxygen goes to the 90s and he develops shortness of breath.    Review Of Systems: Per HPI with the following additions: Denies diarrhea, headache  ROS  Patient Active Problem List   Diagnosis Date Noted  . Influenza 02/28/2016  . Acute respiratory failure with hypoxia ([place]) 03/14/2015  . Acute renal failure superimposed on stage 4 chronic kidney disease ([place]) 03/13/2015  . Acute renal failure (ARF) ([place]) 03/09/2015  . COPD exacerbation ([place])   . Diabetes mellitus ([place]) 03/08/2015  . Anemia 03/08/2015  . Acute on chronic renal failure ([place]) 03/08/2015  . Urinary retention 03/08/2015  . Kidney lesion, native, left 03/08/2015  . Pericardial effusion 03/08/2015  .  COPD (chronic obstructive pulmonary disease) ([place]) 03/08/2015  . Tobacco abuse 03/08/2015  . CHRONIC KIDNEY DISEASE STAGE III (MODERATE) 11/06/2009  . ELEVATED PROSTATE SPECIFIC ANTIGEN 11/04/2009  . HYPERLIPIDEMIA 10/10/2009  . ERECTILE DYSFUNCTION 10/10/2009  . Essential hypertension 10/10/2009  . PREDIABETES 10/10/2009  . COLONIC POLYPS, HX OF 10/10/2009    Past Medical History: Past  Medical History:  Diagnosis Date  . Cancer [facility])    prostate  . Chronic kidney disease    Dialysis M/w/F  . COPD (chronic obstructive pulmonary disease) ([place])   . Diabetes mellitus    Type 2   . GERD (gastroesophageal reflux disease)   . High cholesterol   . Hypertension   . Shortness of breath dyspnea     Past Surgical History: Past Surgical History:  Procedure Laterality Date  . AV FISTULA PLACEMENT Left 03/13/2015   Procedure: Left arm basilic vein transposition .;  Surgeon: Elam Dutch, MD;  Location: [place];  Service: Vascular;  Laterality: Left;  . AV FISTULA PLACEMENT Right 02/18/2016   Procedure: RADIOCEPHALIC ARTERIOVENOUS (AV) FISTULA CREATION;  Surgeon: Waynetta Sandy, MD;  Location: [place];  Service: Vascular;  Laterality: Right;  . COLON RESECTION    . KNEE SURGERY    . TOTAL HIP ARTHROPLASTY     bilat  . TOTAL SHOULDER ARTHROPLASTY     right    Social History: Social History  Substance Use Topics  . Smoking status: Current Some Day Smoker    Packs/day: 0.03    Types: Cigarettes  . Smokeless tobacco: Never Used  . Alcohol use No   Additional social history: + tobacco use, denies etoh or elicit drug use Please also refer to relevant sections of EMR.  Family History: Family History  Problem Relation Age of Onset  . Heart failure Mother   . CAD Father   . Prostate cancer Neg Hx   . Kidney cancer Neg Hx     Allergies and Medications: Allergies  Allergen Reactions  . Pravastatin Other (See Comments)    Muscle pain  . Simvastatin Other (See Comments)    Muscle pain   No current facility-administered medications on file prior to encounter.    Current Outpatient Prescriptions on File Prior to Encounter  Medication Sig Dispense Refill  . amLODipine (NORVASC) 10 MG tablet Take 10 mg by mouth daily.    Marland Kitchen atorvastatin (LIPITOR) 20 MG tablet Take 10 mg by mouth daily.    . furosemide (LASIX) 20 MG tablet Take 1 tablet (20 mg total) by mouth  daily. 30 tablet 0  . glipiZIDE (GLUCOTROL) 10 MG tablet Take 1 tablet (10 mg total) by mouth daily before breakfast. 30 tablet 0  . oxyCODONE-acetaminophen (ROXICET) 5-325 MG tablet Take 1-2 tablets by mouth every 6 (six) hours as needed for moderate pain or severe pain. 30 tablet 0  . tamsulosin (FLOMAX) 0.4 MG CAPS capsule Take 0.4 mg by mouth daily.      Objective: BP 163/87   Pulse 117   Temp 99.4 F (37.4 C) (Oral)   Resp 26   Ht 5\' 11"  (1.803 m)   Wt 202 lb (91.6 kg)   SpO2 98%   BMI 28.17 kg/m  Exam: General: NAD, lying in bed Eyes: EOMI, PERRL ENTM: MMM Cardiovascular: tachycardic, regular rhythm, + DP pulses Respiratory: diffusely coarse and rhochorus breath sounds, normal work of breathing Gastrointestinal: abdomen soft and non tender Extremities- no LE edema  Derm: no rashes or lesions noted Neuro: no focal  deficit Psych: normal mood and affect  Labs and Imaging: CBC BMET   Recent Labs Lab 02/28/16 1451 02/28/16 1819  WBC 6.9  --   HGB 11.7* 12.6*  HCT 36.1* 37.0*  PLT 294  --     Recent Labs Lab 02/28/16 1809 02/28/16 1819  NA 135 133*  K 3.9 3.8  CL 99* 99*  CO2 23  --   BUN 36* 35*  CREATININE 6.78* 7.10*  GLUCOSE 182* 181*  CALCIUM 8.2*  --      CXR  No active cardiopulmonary disease. Veatrice Bourbon, MD 02/28/2016, 7:38 PM PGY-3, [place] [facility] pager: [phone], text pages welcome

## 2016-02-28 NOTE — ED Notes (Signed)
Patient transported to X-ray 

## 2016-02-28 NOTE — ED Notes (Signed)
Pt c/o on ambulation to restroom; pt placed on 1L [place].

## 2016-02-28 NOTE — ED Notes (Signed)
Lab called and informed RN that BMP "hemolyzed." BMP reordered, phleb tech made aware.

## 2016-02-28 NOTE — Progress Notes (Signed)
Late entry: Pt lactic acid 2.1. MD made aware, stated to continue to monitor.   Eleanora Neighbor, RN

## 2016-02-28 NOTE — ED Triage Notes (Signed)
Pt BIB ems from home where pt reports flu like symptoms x 1 day; fever/chills, productive cough with yellow sputum. Pt wheezing/SOB; received 10 albuterol and 0.5 atrovent with EMS. Pt recieves dialysis M,W,F, did not receive dialysis today. A&Ox4; NAD noted at this time.

## 2016-02-28 NOTE — ED Provider Notes (Signed)
  Physical Exam  BP 172/81   Pulse 110   Resp (!) 31   Ht 5\' 11"  (1.803 m)   Wt 202 lb (91.6 kg)   SpO2 100%   BMI 28.17 kg/m   Physical Exam  ED Course  Procedures  MDM Received patient in signout from Dr. Jeneen Rinks. Cough and URI symptoms shortness of breath. Fevers with some yellow production. Wheezing shortness of breath. Not on oxygen at baseline. Has a resting tachycardia. Resting tachypnea. Sats running right around 90% on room air. Continued dyspnea. Will give another breathing treatment. Positive flu. Dialysis patient that did not receive dialysis today. Will admit to internal medicine.       Davonna Belling, MD 02/28/16 [phone]

## 2016-02-29 DIAGNOSIS — Z992 Dependence on renal dialysis: Secondary | ICD-10-CM

## 2016-02-29 DIAGNOSIS — N186 End stage renal disease: Secondary | ICD-10-CM

## 2016-02-29 LAB — LACTIC ACID, PLASMA
Lactic Acid, Venous: 3.1 mmol/L (ref 0.5–1.9)
Lactic Acid, Venous: 1 mmol/L (ref 0.5–1.9)
Lactic Acid, Venous: 0.6 mmol/L (ref 0.5–1.9)

## 2016-02-29 LAB — CBC
HEMATOCRIT: 33 % — AB (ref 39.0–52.0)
Hemoglobin: 10.3 g/dL — ABNORMAL LOW (ref 13.0–17.0)
MCH: 31.8 pg (ref 26.0–34.0)
MCHC: 31.2 g/dL (ref 30.0–36.0)
MCV: 101.9 fL — AB (ref 78.0–100.0)
PLATELETS: 281 10*3/uL (ref 150–400)
RBC: 3.24 MIL/uL — ABNORMAL LOW (ref 4.22–5.81)
RDW: 16.4 % — AB (ref 11.5–15.5)
WBC: 6.4 10*3/uL (ref 4.0–10.5)

## 2016-02-29 LAB — RENAL FUNCTION PANEL
Albumin: 2.7 g/dL — ABNORMAL LOW (ref 3.5–5.0)
Anion gap: 14 (ref 5–15)
BUN: 42 mg/dL — AB (ref 6–20)
CHLORIDE: 100 mmol/L — AB (ref 101–111)
CO2: 21 mmol/L — AB (ref 22–32)
Calcium: 8.3 mg/dL — ABNORMAL LOW (ref 8.9–10.3)
Creatinine, Ser: 7.27 mg/dL — ABNORMAL HIGH (ref 0.61–1.24)
GFR calc non Af Amer: 7 mL/min — ABNORMAL LOW (ref >60)
GFR, EST AFRICAN AMERICAN: 8 mL/min — AB (ref >60)
Glucose, Bld: 169 mg/dL — ABNORMAL HIGH (ref 65–99)
POTASSIUM: 4.4 mmol/L (ref 3.5–5.1)
Phosphorus: 7.6 mg/dL — ABNORMAL HIGH (ref 2.5–4.6)
Sodium: 135 mmol/L (ref 135–145)

## 2016-02-29 LAB — HEMOGLOBIN A1C
Hgb A1c MFr Bld: 5.5 % (ref 4.8–5.6)
MEAN PLASMA GLUCOSE: 111 mg/dL

## 2016-02-29 LAB — GLUCOSE, CAPILLARY
GLUCOSE-CAPILLARY: 94 mg/dL (ref 65–99)
Glucose-Capillary: 75 mg/dL (ref 65–99)
Glucose-Capillary: 217 mg/dL — ABNORMAL HIGH (ref 65–99)

## 2016-02-29 LAB — MRSA PCR SCREENING: MRSA by PCR: NEGATIVE

## 2016-02-29 MED ORDER — LIDOCAINE HCL (PF) 1 % IJ SOLN: 5.0000 mL | INTRAMUSCULAR | Status: DC | PRN

## 2016-02-29 MED ORDER — PENTAFLUOROPROP-TETRAFLUOROETH EX AERO: 1.0000 "application " | INHALATION_SPRAY | CUTANEOUS | Status: DC | PRN

## 2016-02-29 MED ORDER — SODIUM CHLORIDE 0.9 % IV SOLN
62.5000 mg | INTRAVENOUS | Status: DC
  Administered 2016-03-04: 62.5 mg via INTRAVENOUS
  Filled 2016-02-29 (×2): qty 5

## 2016-02-29 MED ORDER — PRO-STAT SUGAR FREE PO LIQD
30.0000 mL | Freq: Two times a day (BID) | ORAL | Status: DC
  Administered 2016-02-29 – 2016-03-05 (×9): 30 mL via ORAL
  Filled 2016-02-29 (×8): qty 30

## 2016-02-29 MED ORDER — HEPARIN SODIUM (PORCINE) 1000 UNIT/ML DIALYSIS: 1000.0000 [IU] | INTRAMUSCULAR | Status: DC | PRN

## 2016-02-29 MED ORDER — SODIUM CHLORIDE 0.9 % IV SOLN: 100.0000 mL | INTRAVENOUS | Status: DC | PRN

## 2016-02-29 MED ORDER — IPRATROPIUM-ALBUTEROL 0.5-2.5 (3) MG/3ML IN SOLN
3.0000 mL | RESPIRATORY_TRACT | Status: DC
  Administered 2016-02-29 – 2016-03-03 (×18): 3 mL via RESPIRATORY_TRACT
  Filled 2016-02-29 (×19): qty 3

## 2016-02-29 MED ORDER — ALTEPLASE 2 MG IJ SOLR: 2.0000 mg | Freq: Once | INTRAMUSCULAR | Status: DC | PRN

## 2016-02-29 MED ORDER — CALCITRIOL 0.5 MCG PO CAPS: 1.0000 ug | ORAL_CAPSULE | ORAL | Status: DC | PRN

## 2016-02-29 MED ORDER — LIDOCAINE-PRILOCAINE 2.5-2.5 % EX CREA: 1.0000 "application " | TOPICAL_CREAM | CUTANEOUS | Status: DC | PRN

## 2016-02-29 MED ORDER — SUCROFERRIC OXYHYDROXIDE 500 MG PO CHEW
500.0000 mg | CHEWABLE_TABLET | Freq: Three times a day (TID) | ORAL | Status: DC
  Administered 2016-02-29 – 2016-03-01 (×4): 500 mg via ORAL
  Filled 2016-02-29 (×16): qty 1

## 2016-02-29 NOTE — Procedures (Signed)
Patient seen and examined on Hemodialysis.  No issues today.  Feeling better. QB 400 UF goal 2 liters Treatment adjusted as needed.  Madelon Lips MD [facility] 4:28 PM

## 2016-02-29 NOTE — Progress Notes (Signed)
CRITICAL VALUE ALERT  Critical value received:  Lactic Acid 3.1  Date of notification:  02/29/2016  Time of notification:  0018  Critical value read back:Yes.    Nurse who received alert:  Jyl Heinz RN  Spoke with MD on call for Family Medicine at [place] and he stated to continue to monitor. Pt is supposed to have a dialysis treatment tomorrow per MD and stated levels can be elevated d/t missing his dialysis treatment. Pt is currently asymptomatic. Will continue to monitor.   Eleanora Neighbor, RN

## 2016-02-29 NOTE — Progress Notes (Signed)
Family Medicine Teaching Service Daily Progress Note Intern Pager: [phone]  Patient name: [name] Medical record number: [id number] Date of birth: 06/11/1950 Age: 66 y.o. Gender: male  Primary Care Provider: [facility] Consultants: Nephrology Code Status: Full  Pt Overview and Major Events to Date:  1/19- admission for flu and COPD exacerbation  Assessment and Plan: [name] is a 66 y.o. male presenting with cough, shortness of breath and increased sputum production found to have + flu also concern for COPD exacerbation. PMH is significant for ESRD, HTN, DM, COPD, tobacco useJerry ANSON [name] is a 66 y.o. male presenting with cough, shortness of breath and increased sputum production. PMH is significant for ESRD, HTN, DM, COPD, tobacco use  Respiratory Distress/+Influenza- Afebrile overnight, WBC improving. Improved likely due to mixture of COPD, + flu. Less likely pulm edema as CXR inconsistent with that picture. Lacti acid increased to 3.1 from 2.1 in setting of missed dialysis there is concern that he is not able to renally clear lactate - continue supplemental oxygen as needed - continue droplet precautins - tamiflu 30 mg qD x5 days (D2) - trend lactate  COPD-stable - prednisone 40 qD x5 days(D2) - levoquin dosed per pharm x 5 days, given degree of shortness of breath on presentation and medical co-morbidities ( D2) - duonebs q6, abluterol q2 prn - supplemental O2 as above - may have a baseline O2 requirement, assess prior to discharge - not on a controller medication, will likely need to start a LABA + steroid  ESRD- missed dialysis on 1/19. No evidence of fluid overload on exam, K 4.4, Na 135, Cr 7.27 - nephrology consult, anticipate dialysis in the AM - follow AM renal function panel - continue home lasix 20 mg qD  Diabetes- no A1c in epic, on glipizide at home - sensitive SSI- will continue while on steroids inpateint - ACHS CBGs - A1c- 5.5  HTN-  Stable - will continue home norvasc 10, lasix 20 mg qD - hydral 5 mg IV PRN  Urinary retention - continue tamsulosin  FEN/GI: carb/heart healthy, SLIV as tolerating PO Prophylaxis: heparin  Disposition: pending clinical improvement  Subjective:  Reports breathing difficulty unchanged, denies chest pain  Objective: Temp:  [98.5 F (36.9 C)-99.4 F (37.4 C)] 98.5 F (36.9 C) (01/20 0417) Pulse Rate:  [96-122] 99 (01/20 0417) Resp:  [19-32] 20 (01/20 0417) BP: (139-197)/(57-105) 139/66 (01/20 0417) SpO2:  [89 %-100 %] 93 % (01/20 0417) Weight:  [191 lb 12.8 oz (87 kg)-202 lb (91.6 kg)] 191 lb 12.8 oz (87 kg) (01/19 2121) Physical Exam: General: NAD, lying in bed initially sleeping but awakens easily Cardiovascular: RRR Respiratory: coarse breath sounds difficulty but improved since last exam yesterday, wheezes throughout, normal work of bre Extremities: no LE edema  Laboratory:  Recent Labs Lab 02/28/16 1451 02/28/16 1819 02/28/16 2057 02/29/16 0402  WBC 6.9  --  7.7 6.4  HGB 11.7* 12.6* 11.4* 10.3*  HCT 36.1* 37.0* 36.1* 33.0*  PLT 294  --  297 281    Recent Labs Lab 02/28/16 1809 02/28/16 1819 02/28/16 2057  NA 135 133*  --   K 3.9 3.8  --   CL 99* 99*  --   CO2 23  --   --   BUN 36* 35*  --   CREATININE 6.78* 7.10* 7.12*  CALCIUM 8.2*  --   --   GLUCOSE 182* 181*  --       Veatrice Bourbon, MD 02/29/2016,  6:19 AM PGY-3, [place] Intern pager: [phone], text pages welcome

## 2016-02-29 NOTE — Progress Notes (Addendum)
New Admission Note: Late Entry  Arrival Method: By Bed from ED around 2045 Mental Orientation: Alert and oriented Telemetry: Box 6e04, CCMD notified Assessment: Completed Skin: Complete, refer to flowsheets Iv: Left AC  Pain: Denies Tubes: None Safety Measures: Safety Fall Prevention Plan was given, discussed. Admission: Completed [facility] [place] Orientation: Patient has been orientated to the room, unit and the staff. Family: None at bedside  Orders have been reviewed and implemented. Will continue to monitor the patient. Call light has been placed within reach.  Perry Mount, RN  Phone Number: [phone]

## 2016-02-29 NOTE — Consult Note (Signed)
Reason for Consult: To manage dialysis and dialysis related needs Referring Physician: Dr. Andria Frames [facility]  [name] is an 66 y.o. male.  HPI: 78M with a PMH significant for ESRD on HD MWF at [facility], COPD, DM II and HTN who was admitted to [facility] for SOB and fevers.  Currently being treated for influenza and COPD exac.  Asked to see for management of HD and dialysis related needs.  Has no complaints now.  He states that he is feeling better since coming into the hospital.    Dialyzes at [facility]  EDW 88.5 kg HD 4 hr 15 min Bath 2K / 2.5 Ca, Dialyzer F180 Heparin 5000 u bolus Access TDC. Mircera 100 q 2 weeks last given 02/19/2016 Calcitriol 1.0 mcg each treatment Venofer 50 mg q week  Past Medical History:  Diagnosis Date  . Cancer [facility])    prostate  . Chronic kidney disease    Dialysis M/w/F  . COPD (chronic obstructive pulmonary disease) ([place])   . Diabetes mellitus    Type 2   . GERD (gastroesophageal reflux disease)   . High cholesterol   . Hypertension   . Shortness of breath dyspnea     Past Surgical History:  Procedure Laterality Date  . AV FISTULA PLACEMENT Left 03/13/2015   Procedure: Left arm basilic vein transposition .;  Surgeon: Elam Dutch, MD;  Location: [place];  Service: Vascular;  Laterality: Left;  . AV FISTULA PLACEMENT Right 02/18/2016   Procedure: RADIOCEPHALIC ARTERIOVENOUS (AV) FISTULA CREATION;  Surgeon: Waynetta Sandy, MD;  Location: [place];  Service: Vascular;  Laterality: Right;  . COLON RESECTION    . KNEE SURGERY    . TOTAL HIP ARTHROPLASTY     bilat  . TOTAL SHOULDER ARTHROPLASTY     right    Family History  Problem Relation Age of Onset  . Heart failure Mother   . CAD Father   . Prostate cancer Neg Hx   . Kidney cancer Neg Hx     Social History:  reports that he has been smoking Cigarettes.  He has been smoking about 0.03 packs per day. He has never used smokeless tobacco. He reports that he does not drink alcohol or use  drugs.  Allergies:  Allergies  Allergen Reactions  . Pravastatin Other (See Comments)    Muscle pain  . Simvastatin Other (See Comments)    Muscle pain    Medications: I have reviewed the patient's current medications.   Results for orders placed or performed during the hospital encounter of 02/28/16 (from the past 48 hour(s))  CBC with Differential/Platelet     Status: Abnormal   Collection Time: 02/28/16  2:51 PM  Result Value Ref Range   WBC 6.9 4.0 - 10.5 K/uL   RBC 3.61 (L) 4.22 - 5.81 MIL/uL   Hemoglobin 11.7 (L) 13.0 - 17.0 g/dL   HCT 36.1 (L) 39.0 - 52.0 %   MCV 100.0 78.0 - 100.0 fL   MCH 32.4 26.0 - 34.0 pg   MCHC 32.4 30.0 - 36.0 g/dL   RDW 16.4 (H) 11.5 - 15.5 %   Platelets 294 150 - 400 K/uL   Neutrophils Relative % 69 %   Neutro Abs 4.8 1.7 - 7.7 K/uL   Lymphocytes Relative 18 %   Lymphs Abs 1.2 0.7 - 4.0 K/uL   Monocytes Relative 12 %   Monocytes Absolute 0.8 0.1 - 1.0 K/uL   Eosinophils Relative 0 %   Eosinophils  Absolute 0.0 0.0 - 0.7 K/uL   Basophils Relative 1 %   Basophils Absolute 0.0 0.0 - 0.1 K/uL  Influenza panel by PCR (type A & B)     Status: Abnormal   Collection Time: 02/28/16  3:52 PM  Result Value Ref Range   Influenza A By PCR POSITIVE (A) NEGATIVE   Influenza B By PCR NEGATIVE NEGATIVE    Comment: (NOTE) The Xpert Xpress Flu assay is intended as an aid in the diagnosis of  influenza and should not be used as a sole basis for treatment.  This  assay is FDA approved for nasopharyngeal swab specimens only. Nasal  washings and aspirates are unacceptable for Xpert Xpress Flu testing.   Basic metabolic panel     Status: Abnormal   Collection Time: 02/28/16  6:09 PM  Result Value Ref Range   Sodium 135 135 - 145 mmol/L   Potassium 3.9 3.5 - 5.1 mmol/L   Chloride 99 (L) 101 - 111 mmol/L   CO2 23 22 - 32 mmol/L   Glucose, Bld 182 (H) 65 - 99 mg/dL   BUN 36 (H) 6 - 20 mg/dL   Creatinine, Ser 6.78 (H) 0.61 - 1.24 mg/dL   Calcium 8.2 (L)  8.9 - 10.3 mg/dL   GFR calc non Af Amer 7 (L) >60 mL/min   GFR calc Af Amer 9 (L) >60 mL/min    Comment: (NOTE) The eGFR has been calculated using the CKD EPI equation. This calculation has not been validated in all clinical situations. eGFR's persistently <60 mL/min signify possible Chronic Kidney Disease.    Anion gap 13 5 - 15  I-stat Chem 8, ED     Status: Abnormal   Collection Time: 02/28/16  6:19 PM  Result Value Ref Range   Sodium 133 (L) 135 - 145 mmol/L   Potassium 3.8 3.5 - 5.1 mmol/L   Chloride 99 (L) 101 - 111 mmol/L   BUN 35 (H) 6 - 20 mg/dL   Creatinine, Ser 7.10 (H) 0.61 - 1.24 mg/dL   Glucose, Bld 181 (H) 65 - 99 mg/dL   Calcium, Ion 1.00 (L) 1.15 - 1.40 mmol/L   TCO2 23 0 - 100 mmol/L   Hemoglobin 12.6 (L) 13.0 - 17.0 g/dL   HCT 37.0 (L) 39.0 - 52.0 %  Lactic acid, plasma     Status: Abnormal   Collection Time: 02/28/16  8:11 PM  Result Value Ref Range   Lactic Acid, Venous 2.1 (HH) 0.5 - 1.9 mmol/L    Comment: CRITICAL RESULT CALLED TO, READ BACK BY AND VERIFIED WITH: DAVIS C,RN 02/28/16 2108 WAYK   CBC     Status: Abnormal   Collection Time: 02/28/16  8:57 PM  Result Value Ref Range   WBC 7.7 4.0 - 10.5 K/uL   RBC 3.55 (L) 4.22 - 5.81 MIL/uL   Hemoglobin 11.4 (L) 13.0 - 17.0 g/dL   HCT 36.1 (L) 39.0 - 52.0 %   MCV 101.7 (H) 78.0 - 100.0 fL   MCH 32.1 26.0 - 34.0 pg   MCHC 31.6 30.0 - 36.0 g/dL   RDW 16.4 (H) 11.5 - 15.5 %   Platelets 297 150 - 400 K/uL  Creatinine, serum     Status: Abnormal   Collection Time: 02/28/16  8:57 PM  Result Value Ref Range   Creatinine, Ser 7.12 (H) 0.61 - 1.24 mg/dL   GFR calc non Af Amer 7 (L) >60 mL/min   GFR calc Af Amer 8 (L) >  60 mL/min    Comment: (NOTE) The eGFR has been calculated using the CKD EPI equation. This calculation has not been validated in all clinical situations. eGFR's persistently <60 mL/min signify possible Chronic Kidney Disease.   Hemoglobin A1c     Status: None   Collection Time: 02/28/16   8:57 PM  Result Value Ref Range   Hgb A1c MFr Bld 5.5 4.8 - 5.6 %    Comment: (NOTE)         Pre-diabetes: 5.7 - 6.4         Diabetes: >6.4         Glycemic control for adults with diabetes: <7.0    Mean Plasma Glucose 111 mg/dL    Comment: (NOTE) Performed At: [facility] [place], [place] [id number] Lindon Romp MD NW:[id number]   Blood gas, arterial     Status: Abnormal   Collection Time: 02/28/16  9:15 PM  Result Value Ref Range   FIO2 0.28    Delivery systems NASAL CANNULA    pH, Arterial 7.269 (L) 7.350 - 7.450   pCO2 arterial 52.4 (H) 32.0 - 48.0 mmHg   pO2, Arterial 68.2 (L) 83.0 - 108.0 mmHg   Bicarbonate 23.2 20.0 - 28.0 mmol/L   Acid-base deficit 2.7 (H) 0.0 - 2.0 mmol/L   O2 Saturation 89.9 %   Patient temperature 98.6    Collection site LEFT RADIAL    Drawn by [phone]    Sample type ARTERIAL    Allens test (pass/fail) PASS PASS   Mechanical Rate ARTERIAL   Glucose, capillary     Status: Abnormal   Collection Time: 02/28/16  9:18 PM  Result Value Ref Range   Glucose-Capillary 312 (H) 65 - 99 mg/dL  Lactic acid, plasma     Status: Abnormal   Collection Time: 02/28/16 11:28 PM  Result Value Ref Range   Lactic Acid, Venous 3.1 (HH) 0.5 - 1.9 mmol/L    Comment: CRITICAL RESULT CALLED TO, READ BACK BY AND VERIFIED WITH: DAVIS C,RN 02/29/16 0010 WAYK   MRSA PCR Screening     Status: None   Collection Time: 02/29/16 12:03 AM  Result Value Ref Range   MRSA by PCR NEGATIVE NEGATIVE    Comment:        The GeneXpert MRSA Assay (FDA approved for NASAL specimens only), is one component of a comprehensive MRSA colonization surveillance program. It is not intended to diagnose MRSA infection nor to guide or monitor treatment for MRSA infections.   CBC     Status: Abnormal   Collection Time: 02/29/16  4:02 AM  Result Value Ref Range   WBC 6.4 4.0 - 10.5 K/uL   RBC 3.24 (L) 4.22 - 5.81 MIL/uL   Hemoglobin 10.3 (L) 13.0 - 17.0 g/dL    HCT 33.0 (L) 39.0 - 52.0 %   MCV 101.9 (H) 78.0 - 100.0 fL   MCH 31.8 26.0 - 34.0 pg   MCHC 31.2 30.0 - 36.0 g/dL   RDW 16.4 (H) 11.5 - 15.5 %   Platelets 281 150 - 400 K/uL  Renal function panel     Status: Abnormal   Collection Time: 02/29/16  4:02 AM  Result Value Ref Range   Sodium 135 135 - 145 mmol/L   Potassium 4.4 3.5 - 5.1 mmol/L   Chloride 100 (L) 101 - 111 mmol/L   CO2 21 (L) 22 - 32 mmol/L   Glucose, Bld 169 (H) 65 - 99 mg/dL   BUN  42 (H) 6 - 20 mg/dL   Creatinine, Ser 7.27 (H) 0.61 - 1.24 mg/dL   Calcium 8.3 (L) 8.9 - 10.3 mg/dL   Phosphorus 7.6 (H) 2.5 - 4.6 mg/dL   Albumin 2.7 (L) 3.5 - 5.0 g/dL   GFR calc non Af Amer 7 (L) >60 mL/min   GFR calc Af Amer 8 (L) >60 mL/min    Comment: (NOTE) The eGFR has been calculated using the CKD EPI equation. This calculation has not been validated in all clinical situations. eGFR's persistently <60 mL/min signify possible Chronic Kidney Disease.    Anion gap 14 5 - 15  Lactic acid, plasma     Status: None   Collection Time: 02/29/16  7:28 AM  Result Value Ref Range   Lactic Acid, Venous 1.0 0.5 - 1.9 mmol/L  Glucose, capillary     Status: None   Collection Time: 02/29/16  7:55 AM  Result Value Ref Range   Glucose-Capillary 75 65 - 99 mg/dL  Glucose, capillary     Status: None   Collection Time: 02/29/16  2:11 PM  Result Value Ref Range   Glucose-Capillary 94 65 - 99 mg/dL  Lactic acid, plasma     Status: None   Collection Time: 02/29/16  2:28 PM  Result Value Ref Range   Lactic Acid, Venous 0.6 0.5 - 1.9 mmol/L    Dg Chest 2 View  Result Date: 02/28/2016 CLINICAL DATA:  Cough, congestion, shortness of breath EXAM: CHEST  2 VIEW COMPARISON:  03/08/2015 FINDINGS: Interval placement of a dual-lumen right-sided central venous catheter with the tip projecting over the cavoatrial junction. There is no focal parenchymal opacity. There is no pleural effusion or pneumothorax. The heart and mediastinal contours are  unremarkable. The osseous structures are unremarkable. IMPRESSION: No active cardiopulmonary disease. Electronically Signed   By: Kathreen Devoid   On: 02/28/2016 15:42    ROS: all other systems reviewed and are negative  Blood pressure (!) 143/75, pulse 99, temperature 98.2 F (36.8 C), temperature source Oral, resp. rate 20, height 5' 11" (1.803 m), weight 86.3 kg (190 lb 4.1 oz), SpO2 99 %. .  GEN NAD, intermittently coughing HEENT EOMI, PERRL NECK no JVD PULM diffuse expiratory wheezes CV RRR ABD soft, nontender EXT trace LE edema NEURO AAO x 3  Assessment/Plan: 1 SOB/ cough: oseltamivir- flu swab positive, continuing for 5 days; also getting levofloxacin.  Nebs, etc  2 ESRD: will dialyze off schedule today then next schedule planned on Monday 3 Hypertension: on amlodipine 10 mg daily, lisinopril 40 mg daily, doxazosin 2 mg daily, per [facility]. Would add back lisinopril 40 mg daily for now and see if he needs the doxazosin.  Lasix not on MAR from [facility] 4. Anemia of ESRD: Last outpatient Hgb 10.8 02/19/16, has recently received Mircera 5. Metabolic Bone Disease: Continue Calcitriol in Hd, velphoro 500 mg TID Ac, last PTH 599 02/19/16  6.  Nutrition: last albumin 3.4 02/19/16, Prostat  Vickee Mormino 02/29/2016, 4:10 PM

## 2016-03-01 LAB — RENAL FUNCTION PANEL
ANION GAP: 10 (ref 5–15)
Albumin: 2.8 g/dL — ABNORMAL LOW (ref 3.5–5.0)
BUN: 25 mg/dL — ABNORMAL HIGH (ref 6–20)
CHLORIDE: 98 mmol/L — AB (ref 101–111)
CO2: 23 mmol/L (ref 22–32)
CREATININE: 4.82 mg/dL — AB (ref 0.61–1.24)
Calcium: 8 mg/dL — ABNORMAL LOW (ref 8.9–10.3)
GFR, EST AFRICAN AMERICAN: 13 mL/min — AB (ref >60)
GFR, EST NON AFRICAN AMERICAN: 11 mL/min — AB (ref >60)
Glucose, Bld: 129 mg/dL — ABNORMAL HIGH (ref 65–99)
Phosphorus: 5.3 mg/dL — ABNORMAL HIGH (ref 2.5–4.6)
Potassium: 4.3 mmol/L (ref 3.5–5.1)
Sodium: 131 mmol/L — ABNORMAL LOW (ref 135–145)

## 2016-03-01 LAB — GLUCOSE, CAPILLARY
GLUCOSE-CAPILLARY: 164 mg/dL — AB (ref 65–99)
GLUCOSE-CAPILLARY: 152 mg/dL — AB (ref 65–99)
Glucose-Capillary: 112 mg/dL — ABNORMAL HIGH (ref 65–99)
Glucose-Capillary: 102 mg/dL — ABNORMAL HIGH (ref 65–99)

## 2016-03-01 LAB — CBC
HCT: 34.4 % — ABNORMAL LOW (ref 39.0–52.0)
HEMOGLOBIN: 10.8 g/dL — AB (ref 13.0–17.0)
MCH: 32 pg (ref 26.0–34.0)
MCHC: 31.4 g/dL (ref 30.0–36.0)
MCV: 101.8 fL — AB (ref 78.0–100.0)
PLATELETS: 255 10*3/uL (ref 150–400)
RBC: 3.38 MIL/uL — AB (ref 4.22–5.81)
RDW: 16 % — ABNORMAL HIGH (ref 11.5–15.5)
WBC: 6.2 10*3/uL (ref 4.0–10.5)

## 2016-03-01 MED ORDER — LEVOFLOXACIN 500 MG PO TABS
500.0000 mg | ORAL_TABLET | ORAL | Status: DC
  Administered 2016-03-01 – 2016-03-03 (×2): 500 mg via ORAL
  Filled 2016-03-01 (×3): qty 1

## 2016-03-01 MED ORDER — LISINOPRIL 20 MG PO TABS: 20.0000 mg | ORAL_TABLET | Freq: Every day | ORAL | Status: DC

## 2016-03-01 NOTE — Progress Notes (Signed)
  [facility] Progress Note   Assessment/ Plan:   1 SOB/ cough: oseltamivir- flu swab positive, continuing for 5 days; also getting levofloxacin.  Nebs, etc   2 ESRD: MWF normally, dialyzed off schedule Saturday then next schedule planned on Monday.  3 Hypertension: on amlodipine 10 mg daily, lisinopril 40 mg daily, doxazosin 2 mg daily, per [facility]. ? If still takes lisinopril.  Lasix not on MAR from [facility]  4. Anemia of ESRD: Last outpatient Hgb 10.8 02/19/16, has recently received Mircera  5. Metabolic Bone Disease: Continue Calcitriol in Hd, velphoro 500 mg TID Ac, last PTH 599 02/19/16   6.  Nutrition: last albumin 3.4 02/19/16, Prostat  Subjective:    Sleeping and doesn't want to be bothered this AM .  Breathing better   Objective:   BP (!) 116/55 (BP Location: Left Arm)   Pulse 89   Temp 98.5 F (36.9 C) (Oral)   Resp 20   Ht 5\' 11"  (1.803 m)   Wt 86.7 kg (191 lb 1.6 oz)   SpO2 93%   BMI 26.65 kg/m   Physical Exam: GEN NAD, sleeping but arousable HEENT EOMI, PERRL NECK no JVD PULM diffuse expiratory wheezes CV RRR ABD soft, nontender EXT trace LE edema NEURO AAO x 3   Labs: BMET  Recent Labs Lab 02/28/16 1809 02/28/16 1819 02/28/16 2057 02/29/16 0402 03/01/16 0404  NA 135 133*  --  135 131*  K 3.9 3.8  --  4.4 4.3  CL 99* 99*  --  100* 98*  CO2 23  --   --  21* 23  GLUCOSE 182* 181*  --  169* 129*  BUN 36* 35*  --  42* 25*  CREATININE 6.78* 7.10* 7.12* 7.27* 4.82*  CALCIUM 8.2*  --   --  8.3* 8.0*  PHOS  --   --   --  7.6* 5.3*   CBC  Recent Labs Lab 02/28/16 1451 02/28/16 1819 02/28/16 2057 02/29/16 0402 03/01/16 0404  WBC 6.9  --  7.7 6.4 6.2  NEUTROABS 4.8  --   --   --   --   HGB 11.7* 12.6* 11.4* 10.3* 10.8*  HCT 36.1* 37.0* 36.1* 33.0* 34.4*  MCV 100.0  --  101.7* 101.9* 101.8*  PLT 294  --  297 281 255    @IMGRELPRIORS @ Medications:    . amLODipine  10 mg Oral Daily  . atorvastatin  10 mg Oral Daily  .  feeding supplement (PRO-STAT SUGAR FREE 64)  30 mL Oral BID  . [START ON 03/04/2016] ferric gluconate (FERRLECIT/NULECIT) IV  62.5 mg Intravenous Weekly  . heparin  5,000 Units Subcutaneous Q8H  . insulin aspart  0-5 Units Subcutaneous QHS  . insulin aspart  0-9 Units Subcutaneous TID WC  . ipratropium-albuterol  3 mL Nebulization Q4H  . levofloxacin  500 mg Oral Q48H  . oseltamivir  30 mg Oral QHS  . predniSONE  40 mg Oral Q breakfast  . sodium chloride flush  3 mL Intravenous Q12H  . sucroferric oxyhydroxide  500 mg Oral TID WC  . tamsulosin  0.4 mg Oral Daily     Madelon Lips MD [facility] 03/01/2016, 12:50 PM

## 2016-03-01 NOTE — Discharge Summary (Signed)
[facility] Discharge Summary  Patient name: [name] Medical record number: [id number] Date of birth: 1950/05/20 Age: 66 y.o. Gender: male Date of Admission: 02/28/2016  Date of Discharge: 03/05/2016 Admitting Physician: Zenia Resides, MD  Primary Care Provider: [facility] Consultants: Nephrology  Indication for Hospitalization: Dyspnea with hypoxia  Discharge Diagnoses/Problem List:  Dyspnea with hypoxemia Influenza positive HFpEF ESRD Non-insulin-dependent diabetes mellitus Hypertension Urinary retention  Disposition: Home  Discharge Condition: Stable, improved  Discharge Exam:  General: elderly male laying in bed receiving dialysis, no acute distress with non-toxic appearance HEENT: normocephalic, atraumatic, moist mucous membranes Neck: supple, non-tender without lymphadenopathy CV: regular rate and rhythm without murmurs, rubs, or gallops, non-edematous Lungs: transmitted upper airway sounds bilaterally with normal work of breathing on 2 L [place], decreased movement of air throughout lung fields bilaterally Abdomen: soft, non-tender, normoactive bowel sounds Skin: warm, dry, no rashes or lesions, cap refill < 2 seconds Extremities: warm and well perfused, normal tone  Brief Hospital Course:  [name] a 65 y.o.male with a past medical history significant for ESRD, HTN, DM, COPD, tobacco use presenting with cough, shortness of breath and increased sputum production found to have + flu also concern for COPD exacerbation.   Patient presented with cough, shortness of breath, and sputum production after missing HD session. Patient endorsed URI symptoms earlier in the week. His shortness of breath prompted him to come to the [facility] ED. Patient denied chest pain.  Upon arrival to ED, patient without signs of respiratory failure. Oxygen levels in the low 90s on room air. Patient remained afebrile and stable. Pertinent labs include no  leukocytosis but positive lactic acid 2.1 likely secondary to ESRD. Patient is noted flu positive and started on Tamiflu. Picture consistent with COPD exacerbation. Patient treated on prednisone and Levaquin for 5 days. Oxygen was weaned over the course of the hospitalization however patient required oxygen upon discharge. Patient was treated with HD 3 during hospitalization. Patient was safe for discharge with home O2.  Issues for Follow Up:  1. Patients hypoxia improved with weaning O2, however patient did require home oxygen upon discharge. 2. Patient completed Tamiflu course during hospitalization given positive influenza. 3. Patient treated for COPD exacerbation with prednisone 40 mg QD, Levaquin 500 mg q48h, with renally dosed. Patient discharged on Spiriva and Pulmicort for controller medication. 4. Patient would benefit from sleep study outpatient given likelihood of OSA. 5. Continue receiving HD M/W/F. Patient continued on home Lasix 20 mg QD for diuresis and pro-stat 30 mL BID per nutrition. 6. Physical therapy cleared patient without needs for follow-up outpatient. 7. Patient instructed to restart home glipizide upon discharge.  Significant Procedures: Hemodialysis on 02/29/16.   Significant Labs and Imaging:   Recent Labs Lab 03/03/16 0453 03/04/16 0626 03/05/16 0614  WBC 6.9 8.5 11.4*  HGB 10.7* 10.6* 11.1*  HCT 33.5* 33.1* 35.0*  PLT 277 287 259    Recent Labs Lab 03/01/16 0404 03/02/16 0840 03/03/16 0453 03/04/16 0626 03/05/16 0614  NA 131* 132* 130* 132* 132*  K 4.3 3.5 4.0 3.9 4.6  CL 98* 97* 96* 97* 98*  CO2 23 25 25 25 24   GLUCOSE 129* 81 119* 91 116*  BUN 25* 46* 30* 52* 37*  CREATININE 4.82* 6.49* 4.78* 6.31* 4.34*  CALCIUM 8.0* 7.9* 8.1* 8.2* 8.2*  PHOS 5.3* 5.2* 4.8* 5.8* 3.9  ALBUMIN 2.8* 2.8* 2.9* 2.8* 2.7*   Influenza: Positive strain A Lactic acid: 2.1>3.1>1.0>0.6 A1c:  5.5 ABG: pH 7.269, pCO2 52.4, pCO2 68.2, bicarb 23.2, O2  89.9%  Imaging/Diagnostic Tests: DG Chest Port 1 View (03/02/2016) FINDINGS: Cardiac shadow is within normal limits. A dialysis catheter is again seen. The lungs are well aerated bilaterally. No focal infiltrate or sizable effusion is seen. No bony abnormality is noted.  IMPRESSION: No active disease.  DG Chest 2 View (02/28/2016) FINDINGS: Interval placement of a dual-lumen right-sided central venous catheter with the tip projecting over the cavoatrial junction. There is no focal parenchymal opacity. There is no pleural effusion or pneumothorax. The heart and mediastinal contours are unremarkable. The osseous structures are unremarkable.  IMPRESSION: No active cardiopulmonary disease.  Results/Tests Pending at Time of Discharge: None  Discharge Medications:  Allergies as of 03/05/2016      Reactions   Pravastatin Other (See Comments)   Muscle pain   Simvastatin Other (See Comments)   Muscle pain      Medication List    TAKE these medications   albuterol 108 (90 Base) MCG/ACT inhaler Commonly known as:  PROVENTIL HFA;VENTOLIN HFA Inhale 1-2 puffs into the lungs every 6 (six) hours as needed for wheezing.   amLODipine 10 MG tablet Commonly known as:  NORVASC Take 10 mg by mouth daily.   atorvastatin 20 MG tablet Commonly known as:  LIPITOR Take 10 mg by mouth daily.   Budesonide 90 MCG/ACT inhaler Commonly known as:  PULMICORT FLEXHALER Inhale 1 puff into the lungs 2 (two) times daily.   feeding supplement (PRO-STAT SUGAR FREE 64) Liqd Take 30 mLs by mouth 2 (two) times daily.   furosemide 20 MG tablet Commonly known as:  LASIX Take 1 tablet (20 mg total) by mouth daily.   glipiZIDE 10 MG tablet Commonly known as:  GLUCOTROL Take 1 tablet (10 mg total) by mouth daily before breakfast.   oxyCODONE-acetaminophen 5-325 MG tablet Commonly known as:  ROXICET Take 1-2 tablets by mouth every 6 (six) hours as needed for moderate pain or severe pain.    tamsulosin 0.4 MG Caps capsule Commonly known as:  FLOMAX Take 0.4 mg by mouth daily.   tiotropium 18 MCG inhalation capsule Commonly known as:  SPIRIVA Place 1 capsule (18 mcg total) into inhaler and inhale daily. Start taking on:  03/06/2016       Discharge Instructions: Please refer to Patient Instructions section of EMR for full details.  Patient was counseled important signs and symptoms that should prompt return to medical care, changes in medications, dietary instructions, activity restrictions, and follow up appointments.   Follow-Up Appointments: Follow-up Information    [facility] Follow up.   Specialty:  General Practice Contact information: [place] [id number] [place], DO 03/05/2016, 10:14 PM PGY-1, [place]

## 2016-03-01 NOTE — Progress Notes (Addendum)
Family Medicine Teaching Service Daily Progress Note Intern Pager: [phone]  Patient name: [name] Medical record number: [id number] Date of birth: 04-01-1950 Age: 66 y.o. Gender: male  Primary Care Provider: [facility] Consultants: Nephrology Code Status: Full  Pt Overview and Major Events to Date:  1/19- admission for flu and COPD exacerbation 1/20- dialyzed in the hospital.   Assessment and Plan: [name] a 65 y.o.malepresenting with cough, shortness of breath and increased sputum production found to have + flu also concern for COPD exacerbation. PMH is significant for ESRD, HTN, DM, COPD, tobacco useJerry W Leeis a 66 y.o.malepresenting with cough, shortness of breath and increased sputum production. PMH is significant for ESRD, HTN, DM, COPD, tobacco use  Respiratory Distress with Influenza Infection: This appears to be improving as his lung exam has improved from admission, he has remained afebrile and his leukocytosis and lactic acidosis have resolved. We think his presenting symptom of dyspnea was likely due to this infection as well as a COPD exacerbation.  - Continue supplemental oxygen as needed - Continue droplet precautins - Tamiflu 30 mg qD x5 days (1/19>>). Renally dosed - COPD as below  Dyspnea: improving.  Likely due to COPD Exacerbation in the setting of influenza infection:  but doesn't appear to be moving air well. He has some transmitted upper airway sounds on exam which concerns me for sleep apnea. He is on 2 L by nasal cannula. Lactic acidosis resolved. He has no leukocytosis and remains afebrile - Prednisone 40 qD x5 days(1/19>>) - Levoquin 500 mg q48h (1/19>>). Renally dosed - Tamiflu 30 mg twice a day (1/19>>). Renally dosed - Duonebs q6, abluterol q2 prn - Supplemental O2 as above - May have a baseline O2 requirement, assess prior to discharge - Not on a controller medication, will likely need to start a LABA + LAMA - Consider  Sleep study as outpatient  HFpEF: last Echo in 02/2015 with EF of 60-65%, G2DD. He doesn't appear to be fluid overloaded at this time. His weight is the lowest it has been in years.  Wt Readings from Last 10 Encounters:  02/29/16 191 lb 1.6 oz (86.7 kg)  02/11/16 202 lb (91.6 kg)  04/18/15 214 lb 6.4 oz (97.3 kg)  03/12/15 216 lb 14.9 oz (98.4 kg)  12/05/13 210 lb (95.3 kg)  11/06/09 216 lb 8 oz (98.2 kg)  10/10/09 (!) 228 lb (103.4 kg)  -Continue monitoring  ESRD: He had missed dialysis on 1/19 (scheduled M/W/F), but was dialyzed yesterday. Today he does not appear fluid overloaded on exam. Weight is stable.  - Follow AM renal function panel - Nephrology following and appreciate their recs.  - Continue home lasix 20 mg qD  Diabetes: On glipizide at home and Hgb A1c on 02/28/16 was 5.5 - Sensitive SSI- will continue while on steroids inpateint - ACHS CBGs  HTN: Normotensive at this time.  - Continue home norvasc 10 mg QD, Lasix 20 mg QD  Urinary retention - Continue tamsulosin - Bladder scan (post void)  FEN/GI: carb/heart healthy, SLIV as tolerating PO  Prophylaxis: heparin  Disposition: Admission to floor, [facility].   Subjective:  Yesterday patient had hemodialysis while in the hospital. He states that he does not wear oxygen at home, but that the oxygen here in the hospital is helping with his shortness of breath. Whenever, he gets out of bed and takes off his He reports no fevers, but he does feel chilled at times. He denies any abdominal  pain, constipation, diarrhea, nausea or vomiting.   Objective: Temp:  [98 F (36.7 C)-98.6 F (37 C)] 98.6 F (37 C) (01/21 0526) Pulse Rate:  [77-117] 77 (01/21 0526) Resp:  [20-27] 21 (01/21 0526) BP: (109-163)/(53-99) 109/53 (01/21 0526) SpO2:  [85 %-99 %] 85 % (01/21 0745) FiO2 (%):  [28 %] 28 % (01/20 1640) Weight:  [86.3 kg (190 lb 4.1 oz)-86.7 kg (191 lb 1.6 oz)] 86.7 kg (191 lb 1.6 oz) (01/20 2134) Physical  Exam: General: Well-appearing male laying in the bed in no apparent distress. Cardiovascular: He has a regular rate and rhythm without any murmurs, rubs or gallops. Respiratory: He has a good work of breathing while on 2L O2 via [place]. He does have decreased breath sounds, but is moving air. He has some mild wheezing throughout. He has transmitted upper airway sounds Abdomen: Soft, non-tender and non-distended. Extremities: No lower extremity swelling.   Laboratory:  Recent Labs Lab 02/28/16 2057 02/29/16 0402 03/01/16 0404  WBC 7.7 6.4 6.2  HGB 11.4* 10.3* 10.8*  HCT 36.1* 33.0* 34.4*  PLT 297 281 255    Recent Labs Lab 02/28/16 1809 02/28/16 1819 02/28/16 2057 02/29/16 0402 03/01/16 0404  NA 135 133*  --  135 131*  K 3.9 3.8  --  4.4 4.3  CL 99* 99*  --  100* 98*  CO2 23  --   --  21* 23  BUN 36* 35*  --  42* 25*  CREATININE 6.78* 7.10* 7.12* 7.27* 4.82*  CALCIUM 8.2*  --   --  8.3* 8.0*  GLUCOSE 182* 181*  --  169* 129*   Jacklynn Ganong, MS4 03/01/2016, 9:56 AM [place] Intern pager: [phone], text pages welcome  I have seen and evaluated the patient with Student Dr. Trilby Drummer. I am in agreement with the note above in its revised form. My additions are in red.  Wendee Beavers, MD, PGY-2 03/01/2016 1:34 PM

## 2016-03-02 ENCOUNTER — Inpatient Hospital Stay (HOSPITAL_COMMUNITY): Payer: Medicare Other

## 2016-03-02 LAB — BLOOD GAS, ARTERIAL
Acid-Base Excess: 3 mmol/L — ABNORMAL HIGH (ref 0.0–2.0)
Bicarbonate: 28.2 mmol/L — ABNORMAL HIGH (ref 20.0–28.0)
DRAWN BY: 362771
O2 CONTENT: 4 L/min
O2 SAT: 90.7 %
Patient temperature: 98.6
pCO2 arterial: 53.1 mmHg — ABNORMAL HIGH (ref 32.0–48.0)
pH, Arterial: 7.345 — ABNORMAL LOW (ref 7.350–7.450)
pO2, Arterial: 65.1 mmHg — ABNORMAL LOW (ref 83.0–108.0)

## 2016-03-02 LAB — RENAL FUNCTION PANEL
Albumin: 2.8 g/dL — ABNORMAL LOW (ref 3.5–5.0)
Anion gap: 10 (ref 5–15)
BUN: 46 mg/dL — ABNORMAL HIGH (ref 6–20)
CALCIUM: 7.9 mg/dL — AB (ref 8.9–10.3)
CO2: 25 mmol/L (ref 22–32)
Chloride: 97 mmol/L — ABNORMAL LOW (ref 101–111)
Creatinine, Ser: 6.49 mg/dL — ABNORMAL HIGH (ref 0.61–1.24)
GFR calc non Af Amer: 8 mL/min — ABNORMAL LOW (ref >60)
GFR, EST AFRICAN AMERICAN: 9 mL/min — AB (ref >60)
GLUCOSE: 81 mg/dL (ref 65–99)
Phosphorus: 5.2 mg/dL — ABNORMAL HIGH (ref 2.5–4.6)
Potassium: 3.5 mmol/L (ref 3.5–5.1)
SODIUM: 132 mmol/L — AB (ref 135–145)

## 2016-03-02 LAB — CBC
HCT: 34 % — ABNORMAL LOW (ref 39.0–52.0)
Hemoglobin: 10.8 g/dL — ABNORMAL LOW (ref 13.0–17.0)
MCH: 31.8 pg (ref 26.0–34.0)
MCHC: 31.8 g/dL (ref 30.0–36.0)
MCV: 100 fL (ref 78.0–100.0)
Platelets: 267 10*3/uL (ref 150–400)
RBC: 3.4 MIL/uL — AB (ref 4.22–5.81)
RDW: 15.8 % — ABNORMAL HIGH (ref 11.5–15.5)
WBC: 6.6 10*3/uL (ref 4.0–10.5)

## 2016-03-02 LAB — GLUCOSE, CAPILLARY
GLUCOSE-CAPILLARY: 93 mg/dL (ref 65–99)
Glucose-Capillary: 117 mg/dL — ABNORMAL HIGH (ref 65–99)
Glucose-Capillary: 168 mg/dL — ABNORMAL HIGH (ref 65–99)
Glucose-Capillary: 193 mg/dL — ABNORMAL HIGH (ref 65–99)
Glucose-Capillary: 202 mg/dL — ABNORMAL HIGH (ref 65–99)

## 2016-03-02 MED ORDER — TIOTROPIUM BROMIDE MONOHYDRATE 18 MCG IN CAPS
18.0000 ug | ORAL_CAPSULE | Freq: Every day | RESPIRATORY_TRACT | Status: DC
  Administered 2016-03-03 – 2016-03-05 (×3): 18 ug via RESPIRATORY_TRACT
  Filled 2016-03-02: qty 5

## 2016-03-02 MED ORDER — BUDESONIDE 0.25 MG/2ML IN SUSP
0.2500 mg | Freq: Two times a day (BID) | RESPIRATORY_TRACT | Status: DC
  Administered 2016-03-02 – 2016-03-05 (×7): 0.25 mg via RESPIRATORY_TRACT
  Filled 2016-03-02 (×7): qty 2

## 2016-03-02 NOTE — Progress Notes (Signed)
Initial Nutrition Assessment  DOCUMENTATION CODES:   Not applicable  INTERVENTION:  Continue 30 ml Prostat po BID, each supplement provides 100 kcal and 15 grams of protein.   Encourage adequate PO intake.   NUTRITION DIAGNOSIS:   Increased nutrient needs related to chronic illness as evidenced by estimated needs.  GOAL:   Patient will meet greater than or equal to 90% of their needs  MONITOR:   PO intake, Supplement acceptance, Weight trends, Skin, Labs, I & O's  REASON FOR ASSESSMENT:   Malnutrition Screening Tool    ASSESSMENT:   66 y.o. male presenting with cough, shortness of breath and increased sputum production found to have + flu also concern for COPD exacerbation. PMH is significant for ESRD, HTN, DM, COPD, tobacco useJerry PERLIE [name] is a 66 y.o. male presenting with cough, shortness of breath and increased sputum production. PMH is significant for ESRD, HTN, DM, COPD, tobacco use.  Pt was very fatigued and unable to stay awake during nutritional RD assessment. No family at bedside. Pt reports usually consuming at least 3 meals a day however RD unable to obtain further information. Meal completion has been 100%. Pt currently has Prostat ordered and has been consuming them. RD to continue with current orders. Pt with weight loss since admission. Weight loss likely related to fluid status.   Pt with no observed significant fat or muscle mass loss.   Labs and medications reviewed. Phosphorous elevated at 5.2.  Diet Order:  Diet heart healthy/carb modified Room service appropriate? Yes; Fluid consistency: Thin  Skin:   (Incision on R arm)  Last BM:  1/16  Height:   Ht Readings from Last 1 Encounters:  02/28/16 5\' 11"  (1.803 m)    Weight:   Wt Readings from Last 1 Encounters:  03/02/16 192 lb 0.3 oz (87.1 kg)    Ideal Body Weight:  78 kg  BMI:  Body mass index is 26.78 kg/m.  Estimated Nutritional Needs:   Kcal:  2150-2350  Protein:  110-120  grams  Fluid:  Per MD  EDUCATION NEEDS:   No education needs identified at this time  Corrin Parker, MS, RD, LDN Pager # [phone] After hours/ weekend pager # [phone]

## 2016-03-02 NOTE — Care Management Important Message (Signed)
Important Message  Patient Details  Name: [name] MRN: [id number] Date of Birth: 12-24-1950   Medicare Important Message Given:  Yes    Elizbeth Posa, Rory Percy, RN 03/02/2016, 10:32 AM

## 2016-03-02 NOTE — Significant Event (Addendum)
Rapid Response Event Note  Overview:   Called by respiratory d/t respiratory distress.  Time Called: 1930 Arrival Time: 1945 Event Type: Respiratory  Initial Focused Assessment: Pt alert and oriented with SOB and accessory muscle use. Pt lungs with exp wheezes in upper lobes and very decreased air movement in lower lobes. Wheezing audible from door of room. Pt sating 92% on 4L.  Lung sounds earlier today charted as rhonchi with sats 93% on 3L.  Duonebs and pulmicort treatments given at [employer]. Skin warm and dry.  T-98.5., HR-100, BP-145/74, sats-92, RR-24  Interventions: PCXR and ABG ordered, RT to give albuterol prn treatment at 2120.  Plan of Care (if not transferred): Monitor sats, q2h prn albuterol treatments.  Check CXR and ABG results.  Call RRT if needed.    Event Summary:Dr. Mckeag notified @ 2000 and will come see patient.  Ordered PCXR and ABG.     2050- update-pt sats 99%, patient appears less SOB, lungs still wheezy but more air movement heard t/o all lung fields.    Dillard Essex

## 2016-03-02 NOTE — Progress Notes (Signed)
Family Medicine Teaching Service Daily Progress Note Intern Pager: [phone]  Patient name: [name] Medical record number: [id number] Date of birth: 01-Sep-1950 Age: 66 y.o. Gender: male  Primary Care Provider: [facility] Consultants: Nephrology Code Status: Full  Pt Overview and Major Events to Date:  01/19: Admit for flu and COPD exacerbation 01/20: HD 01/22: HD  Assessment and Plan: [name] a 65 y.o.malepresenting with cough, shortness of breath and increased sputum production found to have + flu also concern for COPD exacerbation. PMH is significant for ESRD, HTN, DM, COPD, tobacco useJerry W Leeis a 66 y.o.malepresenting with cough, shortness of breath and increased sputum production. PMH is significant for ESRD, HTN, DM, COPD, tobacco use.  #Respiratory Distress with Influenza Infection: This appears to be improving as his lung exam has improved from admission, he has remained afebrile and his leukocytosis and lactic acidosis have resolved. We think his presenting symptom of dyspnea was likely due to this infection as well as a COPD exacerbation.  --Continue supplemental oxygen as needed --Continue droplet precautins --Tamiflu 30 mg qD x5 days (1/19>>). Renally dosed --COPD as below  #Dyspnea with hypoxia: Acute, improving. Likely secondary to influenza and COPD exacerbation. He has some transmitted upper airway sounds on exam which concerns me for sleep apnea. He is on 5 L [place]. Lactic acidosis resolved. He has no leukocytosis and remains afebrile. --Prednisone 40 mg QD (day 4 of 5, 1/19>) --Levoquin 500 mg q48h (day 4 of 5, 1/19>), renally dosed --Tamiflu 30 mg BID (day 4 of 5, 1/19>), renally dosed --Duonebs q6h, abluterol q2h PRN --Supplemental O2 as above --May have a baseline O2 requirement, assess prior to discharge --Not on a controller medication, initiating Pulmicort and Spiriva --Consider Sleep study as outpatient --Will ambulate to  evaluate for home O2  #HFpEF: last Echo in 02/2015 with EF of 60-65%, G2DD. He doesn't appear to be fluid overloaded at this time. His weight is the lowest it has been in years. Weighed 191 lb on 1/21. Wt Readings from Last 10 Encounters:  03/01/16 191 lb 1.6 oz (86.7 kg)  02/11/16 202 lb (91.6 kg)  04/18/15 214 lb 6.4 oz (97.3 kg)  03/12/15 216 lb 14.9 oz (98.4 kg)  12/05/13 210 lb (95.3 kg)  11/06/09 216 lb 8 oz (98.2 kg)  10/10/09 (!) 228 lb (103.4 kg)  --Continue monitoring  #ESRD: He had missed dialysis on 1/19 (scheduled M/W/F), but was dialyzed 1/20. Today he does not appear fluid overloaded on exam. Weight is stable.  --Following renal function daily --Nephrology following, appreciate recommendations  --Continue home lasix 20 mg QD  #Non-insulin-dependent diabetes mellitus: Chronic, stable. On glipizide at home and Hgb A1c on 02/28/16 was 5.5 --Sensitive SSI, will continue while on steroids inpateint --ACHS CBGs  #Hypertension: Chronic, stable. Normotensive at this time.  --Continue home norvasc 10 mg QD, Lasix 20 mg QD  #Urinary retention: Chronic, stable. On tenuous and at home. --Continue tamsulosin --Bladder scan (post void)  FEN/GI: Heart healthy/carb modified diet Prophylaxis: Heparin SQ  Disposition: Admission to floor, [facility].   Subjective:  Patient states he continues to have shortness of breath but overall feels improved. Denies palpitations or chest pain. No new complaints as of this morning.  Objective: Temp:  [98.5 F (36.9 C)-99.3 F (37.4 C)] 98.7 F (37.1 C) (01/22 0516) Pulse Rate:  [87-106] 87 (01/22 0516) Resp:  [18-20] 18 (01/22 0516) BP: (116-154)/(55-81) 125/75 (01/22 0516) SpO2:  [85 %-97 %] 94 % (01/22 0516)  Weight:  [191 lb 1.6 oz (86.7 kg)] 191 lb 1.6 oz (86.7 kg) (01/21 2152) Physical Exam: General: elderly male receiving dialysis, well developed, in no acute distress with non-toxic appearance HEENT: normocephalic, atraumatic,  moist mucous membranes Neck: supple, non-tender without lymphadenopathy CV: regular rate and rhythm without murmurs, rubs, or gallops, non-edematous Lungs: transmitted upper airway sounds bilaterally with normal work of breathing on 5 L [place] Abdomen: soft, non-tender, normoactive bowel sounds Skin: warm, dry, no rashes or lesions, cap refill < 2 seconds Extremities: warm and well perfused, normal tone  Laboratory:  Recent Labs Lab 02/28/16 2057 02/29/16 0402 03/01/16 0404  WBC 7.7 6.4 6.2  HGB 11.4* 10.3* 10.8*  HCT 36.1* 33.0* 34.4*  PLT 297 281 255    Recent Labs Lab 02/28/16 1809 02/28/16 1819 02/28/16 2057 02/29/16 0402 03/01/16 0404  NA 135 133*  --  135 131*  K 3.9 3.8  --  4.4 4.3  CL 99* 99*  --  100* 98*  CO2 23  --   --  21* 23  BUN 36* 35*  --  42* 25*  CREATININE 6.78* 7.10* 7.12* 7.27* 4.82*  CALCIUM 8.2*  --   --  8.3* 8.0*  GLUCOSE 182* 181*  --  169* 129*   DG Chest 2 View (02/28/2016) FINDINGS: Interval placement of a dual-lumen right-sided central venous catheter with the tip projecting over the cavoatrial junction. There is no focal parenchymal opacity. There is no pleural effusion or pneumothorax. The heart and mediastinal contours are unremarkable. The osseous structures are unremarkable.  IMPRESSION: No active cardiopulmonary disease.    Harriet Butte, DO, PGY-1 03/02/2016 7:23 AM

## 2016-03-02 NOTE — Progress Notes (Signed)
  [name] Progress Note   Subjective: still wheezing a lot and coughing  Vitals:   03/02/16 1130 03/02/16 1200 03/02/16 1206 03/02/16 1253  BP: 132/75 130/80 133/78 136/78  Pulse: 95 93 89 (!) 109  Resp:  (!) 25 (!) 25 (!) 22  Temp:   98.1 F (36.7 C) 98 F (36.7 C)  TempSrc:   Oral Oral  SpO2:   100% 95%  Weight:   87.1 kg (192 lb 0.3 oz)   Height:        Inpatient medications: . amLODipine  10 mg Oral Daily  . atorvastatin  10 mg Oral Daily  . budesonide (PULMICORT) nebulizer solution  0.25 mg Nebulization BID  . feeding supplement (PRO-STAT SUGAR FREE 64)  30 mL Oral BID  . [START ON 03/04/2016] ferric gluconate (FERRLECIT/NULECIT) IV  62.5 mg Intravenous Weekly  . heparin  5,000 Units Subcutaneous Q8H  . insulin aspart  0-5 Units Subcutaneous QHS  . insulin aspart  0-9 Units Subcutaneous TID WC  . ipratropium-albuterol  3 mL Nebulization Q4H  . levofloxacin  500 mg Oral Q48H  . oseltamivir  30 mg Oral QHS  . predniSONE  40 mg Oral Q breakfast  . sodium chloride flush  3 mL Intravenous Q12H  . sucroferric oxyhydroxide  500 mg Oral TID WC  . tamsulosin  0.4 mg Oral Daily  . tiotropium  18 mcg Inhalation Daily    sodium chloride, sodium chloride, acetaminophen **OR** acetaminophen, albuterol, alteplase, calcitRIOL, heparin, heparin, lidocaine (PF), lidocaine-prilocaine, pentafluoroprop-tetrafluoroeth  Exam: Alert, no distress, audible wheezing across the room No jvd Chest diffuse wheezing post and ant, rhonchi bilat anterior RRR no mrg Abd soft ntnd Trace LE edema NF, ox 3  Dialysis: [facility] MWF  4h 63min   2/2.5 bath  Hep 5000  TDC   88.5kg  - M100 last 1/10  - V50 weekly  - Calc 1.0 ug tiw      Assessment: 1. SOB/ cough - flu A+, underlying COPD, lots of bronchospasm.  On po pred/ nebs / levaquin 2. ESRD HD today,. MWF 3. Volume - at dry wt 4. HTN no chg 5. 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8*  HCT 36.1*  < > 33.0* 34.4* 34.0*  MCV 100.0  < > 101.9* 101.8* 100.0  PLT 294  < > 281 255 267  < > = values in this interval not displayed. Iron/TIBC/Ferritin/ %Sat    Component Value Date/Time   IRON 55 03/09/2015 0442   TIBC 244 (L) 03/09/2015 0442   FERRITIN 348 (H) 03/09/2015 0442   IRONPCTSAT 23 03/09/2015 0442

## 2016-03-02 NOTE — Progress Notes (Signed)
PHARMACY NOTE:  ANTIMICROBIAL RENAL DOSAGE ADJUSTMENT  Current antimicrobial regimen includes a mismatch between antimicrobial dosage and estimated renal function.  As per policy approved by the Pharmacy & Therapeutics and Medical Executive Committees, the antimicrobial dosage will be adjusted accordingly.  Current antimicrobial dosage:  Tamiflu 30mg  PO qhs  Indication: Infleunza  Renal Function:  Estimated Creatinine Clearance: 11.8 mL/min (by C-G formula based on SCr of 6.49 mg/dL (H)). [x]      On intermittent HD, scheduled: MWF (did go off schedule on Saturday 1/20) []      On CRRT    Antimicrobial dosage has been changed to:  ESRD dosing of Tamiflu is qHD (usually x3 doses to cover 5 days of therapy).   Additional comments: will stop Tamiflu after tonight's dose as patient received doses on 1/19, 1/20 and 1/21 and only got HD on 1/20 and again today (1/22).   Thank you for allowing pharmacy to be a part of this patient's care.  Verna Czech, [facility] 03/02/2016 11:00 AM

## 2016-03-02 NOTE — Evaluation (Signed)
Physical Therapy Evaluation Patient Details Name: [name] MRN: [id number] DOB: 07/12/1950 Today's Date: 03/02/2016   History of Present Illness  66 yo male with onset of flu was admitted with low O2 sats, PMHx:  ESRD, DM, COPD  Clinical Impression  Pt is admitted with flu and is experiencing low O2 sats with no supplemental O2 at evaluation, but BS was 91 which pt reports is low for him.  THe combination is making him light headed and should feel better after eating and resting.  Left for HD after PT eval, and will follow acutely to work on increasing endurance with less O2 if able.  For now due to dropping to 88% with short walk with no O2 will need to continue O2 with therapy.    Follow Up Recommendations No PT follow up    Equipment Recommendations  None recommended by PT    Recommendations for Other Services       Precautions / Restrictions Precautions Precautions: Fall (telemetry, HD) Restrictions Weight Bearing Restrictions: No      Mobility  Bed Mobility Overal bed mobility: Modified Independent                Transfers Overall transfer level: Modified independent Equipment used: 1 person hand held assist (to provide min guard)                Ambulation/Gait Ambulation/Gait assistance: Min guard (for safety due to "light headed" complaints) Ambulation Distance (Feet): 60 Feet (30 x 2) Assistive device: 1 person hand held assist (min guard) Gait Pattern/deviations: Step-through pattern;Narrow base of support;Decreased stride length Gait velocity: reduced Gait velocity interpretation: Below normal speed for age/gender General Gait Details: pt did not lose balance but is mildly unsteady at times, low BS per nsg ck  Stairs            Wheelchair Mobility    Modified Rankin (Stroke Patients Only)       Balance Overall balance assessment: Needs assistance Sitting-balance support: Feet supported Sitting balance-Leahy Scale: Good      Standing balance support: Single extremity supported Standing balance-Leahy Scale: Fair Standing balance comment: pt is mainly struggling as he is light headed with low BS                             Pertinent Vitals/Pain Pain Assessment: No/denies pain    Home Living Family/patient expects to be discharged to:: Private residence Living Arrangements: Alone   Type of Home: House         Home Equipment: None      Prior Function Level of Independence: Independent               Hand Dominance   Dominant Hand: Right    Extremity/Trunk Assessment   Upper Extremity Assessment Upper Extremity Assessment: Overall WFL for tasks assessed    Lower Extremity Assessment Lower Extremity Assessment: Overall WFL for tasks assessed    Cervical / Trunk Assessment Cervical / Trunk Assessment: Normal  Communication   Communication: No difficulties  Cognition Arousal/Alertness: Awake/alert Behavior During Therapy: WFL for tasks assessed/performed Overall Cognitive Status: Within Functional Limits for tasks assessed                      General Comments General comments (skin integrity, edema, etc.): Pt is demonstrating loss of O2 sats to 88% with no O2 when initially walking and recovered to 97% with 2L O2  during gait.  Left with HD team after evaluation and being up to chair asking for breakfast    Exercises     Assessment/Plan    PT Assessment Patient needs continued PT services  PT Problem List Decreased activity tolerance;Decreased balance;Decreased mobility;Cardiopulmonary status limiting activity          PT Treatment Interventions DME instruction;Gait training;Stair training;Functional mobility training;Therapeutic activities;Therapeutic exercise;Balance training;Neuromuscular re-education;Patient/family education    PT Goals (Current goals can be found in the Care Plan section)  Acute Rehab PT Goals Patient Stated Goal: to get some food  and feel better PT Goal Formulation: With patient Time For Goal Achievement: 03/16/16 Potential to Achieve Goals: Good    Frequency Min 3X/week   Barriers to discharge Decreased caregiver support home alone    Co-evaluation               End of Session Equipment Utilized During Treatment: Oxygen;Gait belt Activity Tolerance: Patient tolerated treatment well;Treatment limited secondary to medical complications (Comment) (low BS and low O2 with no supplemental O2) Patient left: in bed;with call bell/phone within reach;with nursing/sitter in room;Other (comment) (leaving for HD) Nurse Communication: Mobility status         Time: [id number] PT Time Calculation (min) (ACUTE ONLY): 28 min   Charges:   PT Evaluation $PT Eval Moderate Complexity: 1 Procedure PT Treatments $Gait Training: 8-22 mins   PT G Codes:        Ramond Dial 03/06/2016, 9:15 AM   Mee Hives, PT MS Acute Rehab Dept. Number: [place]

## 2016-03-03 DIAGNOSIS — R0603 Acute respiratory distress: Secondary | ICD-10-CM

## 2016-03-03 LAB — GLUCOSE, CAPILLARY
GLUCOSE-CAPILLARY: 101 mg/dL — AB (ref 65–99)
GLUCOSE-CAPILLARY: 159 mg/dL — AB (ref 65–99)
GLUCOSE-CAPILLARY: 162 mg/dL — AB (ref 65–99)
Glucose-Capillary: 205 mg/dL — ABNORMAL HIGH (ref 65–99)

## 2016-03-03 LAB — RENAL FUNCTION PANEL
ALBUMIN: 2.9 g/dL — AB (ref 3.5–5.0)
ANION GAP: 9 (ref 5–15)
BUN: 30 mg/dL — ABNORMAL HIGH (ref 6–20)
CALCIUM: 8.1 mg/dL — AB (ref 8.9–10.3)
CO2: 25 mmol/L (ref 22–32)
Chloride: 96 mmol/L — ABNORMAL LOW (ref 101–111)
Creatinine, Ser: 4.78 mg/dL — ABNORMAL HIGH (ref 0.61–1.24)
GFR, EST AFRICAN AMERICAN: 13 mL/min — AB (ref >60)
GFR, EST NON AFRICAN AMERICAN: 11 mL/min — AB (ref >60)
GLUCOSE: 119 mg/dL — AB (ref 65–99)
PHOSPHORUS: 4.8 mg/dL — AB (ref 2.5–4.6)
POTASSIUM: 4 mmol/L (ref 3.5–5.1)
SODIUM: 130 mmol/L — AB (ref 135–145)

## 2016-03-03 LAB — CBC
HEMATOCRIT: 33.5 % — AB (ref 39.0–52.0)
HEMOGLOBIN: 10.7 g/dL — AB (ref 13.0–17.0)
MCH: 31.8 pg (ref 26.0–34.0)
MCHC: 31.9 g/dL (ref 30.0–36.0)
MCV: 99.7 fL (ref 78.0–100.0)
Platelets: 277 10*3/uL (ref 150–400)
RBC: 3.36 MIL/uL — AB (ref 4.22–5.81)
RDW: 15.2 % (ref 11.5–15.5)
WBC: 6.9 10*3/uL (ref 4.0–10.5)

## 2016-03-03 MED ORDER — DARBEPOETIN ALFA 60 MCG/0.3ML IJ SOSY
60.0000 ug | PREFILLED_SYRINGE | INTRAMUSCULAR | Status: DC
Start: 2016-03-04 — End: 2016-03-05
  Administered 2016-03-04: 60 ug via INTRAVENOUS

## 2016-03-03 MED ORDER — IPRATROPIUM-ALBUTEROL 0.5-2.5 (3) MG/3ML IN SOLN
3.0000 mL | Freq: Four times a day (QID) | RESPIRATORY_TRACT | Status: DC
  Administered 2016-03-04 – 2016-03-05 (×7): 3 mL via RESPIRATORY_TRACT
  Filled 2016-03-03 (×5): qty 3

## 2016-03-03 NOTE — Progress Notes (Signed)
Subjective:  Tolerated hd yest  On schedule , breathing better still some wheezing   Objective Vital signs in last 24 hours: Vitals:   03/02/16 2343 03/03/16 0143 03/03/16 0430 03/03/16 0432  BP: 140/78   (!) 148/74  Pulse: 95   93  Resp: 16   16  Temp: 98 F (36.7 C)   98.3 F (36.8 C)  TempSrc: Oral   Oral  SpO2: 93% 94% 94% 94%  Weight:      Height:       Weight change: 1.118 kg (2 lb 7.4 oz)  Physical Exam: General:Pleasant , alert , NAD,Ox3 Heart: RRR no m,r,g Lungs:Scattered Wheezing , non labored breathing  Abdomen: soft nt, nd Extremities:no pedal edema Dialysis Access: R ij pcath / pos bruit RFA AVF   Dialysis: [facility] MWF  4h 67min   2/2.5 bath  Hep 5000  TDC   88.5kg  - M100 last 1/10  - V50 weekly  - Calc 1.0 ug tiw      Problem/Plan 1. SOB/ cough - flu A+ With  underlying COPD= Improving   On po pred/ nebs / levaquin 2. ESRD HD on schedule = MWF 3. Volume -  Yesterday post wt 1kg below dry wt tolerated  4. HTN = stable and Amlodipine 10 mg  qday - No  chg 5. Anemia= hgb 10.7 nest esa tomorrow Aranesp / wkly fe on hd  no chg 6. 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  Kelly Splinter MD [facility] pager [phone]   03/03/2016, 4:28 PM    Labs: Basic Metabolic Panel:  Recent Labs Lab 03/01/16 0404 03/02/16 0840 03/03/16 0453  NA 131* 132* 130*  K 4.3 3.5 4.0  CL 98* 97* 96*  CO2 23 25 25   GLUCOSE 129* 81 119*  BUN 25* 46* 30*  CREATININE 4.82* 6.49* 4.78*  CALCIUM 8.0* 7.9* 8.1*  PHOS 5.3* 5.2* 4.8*   Liver Function Tests:  Recent Labs Lab 03/01/16 0404 03/02/16 0840 03/03/16 0453  ALBUMIN 2.8* 2.8* 2.9*   No results for input(s): LIPASE, AMYLASE in the last 168 hours. No results for input(s): AMMONIA in the last 168 hours. CBC:  Recent Labs Lab  02/28/16 1451  02/28/16 2057 02/29/16 0402 03/01/16 0404 03/02/16 0840 03/03/16 0453  WBC 6.9  --  7.7 6.4 6.2 6.6 6.9  NEUTROABS 4.8  --   --   --   --   --   --   HGB 11.7*  < > 11.4* 10.3* 10.8* 10.8* 10.7*  HCT 36.1*  < > 36.1* 33.0* 34.4* 34.0* 33.5*  MCV 100.0  --  101.7* 101.9* 101.8* 100.0 99.7  PLT 294  --  297 281 255 267 277  < > = values in this interval not displayed. Cardiac Enzymes: No results for input(s): CKTOTAL, CKMB, CKMBINDEX, TROPONINI in the last 168 hours. CBG:  Recent Labs Lab 03/02/16 0759 03/02/16 1252 03/02/16 1644 03/02/16 2341 03/03/16 0810  GLUCAP 93 117* 193* 202* 101*    Studies/Results: Dg Chest Port 1 View  Result Date: 03/02/2016 CLINICAL DATA:  Shortness of Breath EXAM: PORTABLE CHEST 1 VIEW COMPARISON:  02/28/2016 FINDINGS: Cardiac shadow is within normal limits. A dialysis catheter is again seen. The lungs are well aerated bilaterally. No focal infiltrate or sizable effusion is seen.  No bony abnormality is noted. IMPRESSION: No active disease. Electronically Signed   By: Inez Catalina M.D.   On: 03/02/2016 20:49   Medications:  . amLODipine  10 mg Oral Daily  . atorvastatin  10 mg Oral Daily  . budesonide (PULMICORT) nebulizer solution  0.25 mg Nebulization BID  . feeding supplement (PRO-STAT SUGAR FREE 64)  30 mL Oral BID  . [START ON 03/04/2016] ferric gluconate (FERRLECIT/NULECIT) IV  62.5 mg Intravenous Weekly  . heparin  5,000 Units Subcutaneous Q8H  . insulin aspart  0-5 Units Subcutaneous QHS  . insulin aspart  0-9 Units Subcutaneous TID WC  . ipratropium-albuterol  3 mL Nebulization Q4H  . levofloxacin  500 mg Oral Q48H  . predniSONE  40 mg Oral Q breakfast  . sodium chloride flush  3 mL Intravenous Q12H  . sucroferric oxyhydroxide  500 mg Oral TID WC  . tamsulosin  0.4 mg Oral Daily  . tiotropium  18 mcg Inhalation Daily

## 2016-03-03 NOTE — Progress Notes (Signed)
Family Medicine Teaching Service Daily Progress Note Intern Pager: [phone]  Patient name: [name] Medical record number: [id number] Date of birth: [birth year] Age: 66 y.o. Gender: male  Primary Care Provider: [facility] Consultants: Nephrology Code Status: Full  Pt Overview and Major Events to Date:  01/19: Admit for flu and COPD exacerbation 01/20: HD 01/22: HD  Assessment and Plan: [name] a 65 y.o.male with a past medical history significant for ESRD, HTN, DM, COPD, tobacco use presenting with cough, shortness of breath and increased sputum production found to have + flu also concern for COPD exacerbation.   #Respiratory Distress with Influenza Infection, improving  Currently on 5L, patient with rapid response yesterday evening. Patient's oxygen  dipped to 92% and patient had extensive audible wheezes. Patient ABG showed some mild hypoxia and CXR was unremarkable. Patient received duoneb treatment throughout the night q2 and improved back to baseline. --Continue supplemental oxygen as needed --Continue droplet precautions --Tamiflu 30 mg bid last day 1/23.  Renally dosed   #Dyspnea with hypoxia, stable   Likely secondary to influenza and COPD exacerbation. He has some transmitted upper airway sounds on exam which concerns me for sleep apnea. He is on 5 L [name]. Will be weaned from oxygen as much as tolerated by patient but most likely will be d/c on some oxygen. --Prednisone 40 mg QD, last day 1/23 --Levoquin 500 mg q48h last day 1/23 renally dosed --Tamiflu 30 mg BID last day 1/23 renally dosed --Duonebs q6h, abluterol q2h PRN --Supplemental O2 as above --May have a baseline O2 requirement, assess prior to discharge --Continue Spiriva --Continue Pulmicort  --Consider Sleep study as outpatient  --Will ambulate to evaluate for home O2  #HFpEF Last Echo in 02/2015 with EF of 60-65%, G2DD. He doesn't appear to be fluid overloaded at this time. His  weight is the lowest it has been in years. Weighed 192 lb on 1/23. Wt Readings from Last 10 Encounters:  03/02/16 192 lb 0.3 oz (87.1 kg)  02/11/16 202 lb (91.6 kg)  04/18/15 214 lb 6.4 oz (97.3 kg)  03/12/15 216 lb 14.9 oz (98.4 kg)  12/05/13 210 lb (95.3 kg)  11/06/09 216 lb 8 oz (98.2 kg)  10/10/09 (!) 228 lb (103.4 kg)  --Continue monitoring  #ESRD, stable  He had missed dialysis on 1/19 (scheduled M/W/F), but was dialyzed 1/20. Today he does not appear fluid overloaded on exam. Weight is stable.  --Following renal function daily --Nephrology following, appreciate recommendations  --Continue home lasix 20 mg QD --Schedule for a session tomorrow 1/24  #Non-insulin-dependent diabetes mellitus, well controlled  On glipizide at home and Hgb A1c on 02/28/16 was 5.5 --Sensitive SSI, will continue while on steroids inpatient --ACHS CBGs  #Hypertension  Normotensive at this time.  --Continue norvasc 10 mg daily  #Urinary retention, Chronic, stable. --Continue tamsulosin 0.4 mg --Bladder scan (post void) as needed  FEN/GI: Heart healthy/carb modified diet Prophylaxis: Heparin SQ  Disposition: Home vs SNF   Subjective:  Patient reports improved breathing this morning. Patient with good appetite this morning. Patient had a rapid response yesterday evening after desaturation and some wheezing. Patient was stable throughout the event.  Objective:  Temp:  [97.8 F (36.6 C)-98.3 F (36.8 C)] 97.8 F (36.6 C) (01/23 0847) Pulse Rate:  [88-102] 88 (01/23 0847) Resp:  [16-18] 16 (01/23 0847) BP: (119-148)/(57-78) 119/61 (01/23 0847) SpO2:  [92 %-96 %] 96 % (01/23 0847)   Physical Exam:  General: elderly male in bed  having breakfast, well developed, in no acute distress with non-toxic appearance HEENT: normocephalic, atraumatic, moist mucous membranes Neck: supple, non-tender without lymphadenopathy CV: regular rate and rhythm without murmurs, rubs, or gallops,  non-edematous Lungs: transmitted upper airway sounds bilaterally with normal work of breathing on 5L [place]. Mild expiratory wheezing noted. Abdomen: soft, non-tender, normoactive bowel sounds Skin: warm, dry, no rashes or lesions, cap refill < 2 seconds Extremities: warm and well perfused, normal tone  Laboratory:  Recent Labs Lab 03/01/16 0404 03/02/16 0840 03/03/16 0453  WBC 6.2 6.6 6.9  HGB 10.8* 10.8* 10.7*  HCT 34.4* 34.0* 33.5*  PLT 255 267 277    Recent Labs Lab 03/01/16 0404 03/02/16 0840 03/03/16 0453  NA 131* 132* 130*  K 4.3 3.5 4.0  CL 98* 97* 96*  CO2 23 25 25   BUN 25* 46* 30*  CREATININE 4.82* 6.49* 4.78*  CALCIUM 8.0* 7.9* 8.1*  GLUCOSE 129* 81 119*   DG Chest 2 View (02/28/2016) Interval placement of a dual-lumen right-sided central venous catheter with the tip projecting over the cavoatrial junction. There is no focal parenchymal opacity. There is no pleural effusion or pneumothorax. The heart and mediastinal contours are unremarkable. The osseous structures are unremarkable.IMPRESSION: No active cardiopulmonary disease.  Dg Chest Port 1 View FINDINGS: Cardiac shadow is within normal limits. A dialysis catheter is again seen. The lungs are well aerated bilaterally. No focal infiltrate or sizable effusion is seen. No bony abnormality is noted. IMPRESSION: No active disease.   Marjie Skiff, MD, PGY-1 03/03/2016 3:26 PM

## 2016-03-04 LAB — RENAL FUNCTION PANEL
ALBUMIN: 2.8 g/dL — AB (ref 3.5–5.0)
ANION GAP: 10 (ref 5–15)
BUN: 52 mg/dL — ABNORMAL HIGH (ref 6–20)
CALCIUM: 8.2 mg/dL — AB (ref 8.9–10.3)
CO2: 25 mmol/L (ref 22–32)
Chloride: 97 mmol/L — ABNORMAL LOW (ref 101–111)
Creatinine, Ser: 6.31 mg/dL — ABNORMAL HIGH (ref 0.61–1.24)
GFR calc Af Amer: 9 mL/min — ABNORMAL LOW (ref >60)
GFR, EST NON AFRICAN AMERICAN: 8 mL/min — AB (ref >60)
Glucose, Bld: 91 mg/dL (ref 65–99)
PHOSPHORUS: 5.8 mg/dL — AB (ref 2.5–4.6)
POTASSIUM: 3.9 mmol/L (ref 3.5–5.1)
Sodium: 132 mmol/L — ABNORMAL LOW (ref 135–145)

## 2016-03-04 LAB — GLUCOSE, CAPILLARY
GLUCOSE-CAPILLARY: 101 mg/dL — AB (ref 65–99)
GLUCOSE-CAPILLARY: 242 mg/dL — AB (ref 65–99)
GLUCOSE-CAPILLARY: 240 mg/dL — AB (ref 65–99)

## 2016-03-04 LAB — CBC
HEMATOCRIT: 33.1 % — AB (ref 39.0–52.0)
HEMOGLOBIN: 10.6 g/dL — AB (ref 13.0–17.0)
MCH: 31.6 pg (ref 26.0–34.0)
MCHC: 32 g/dL (ref 30.0–36.0)
MCV: 98.8 fL (ref 78.0–100.0)
Platelets: 287 10*3/uL (ref 150–400)
RBC: 3.35 MIL/uL — AB (ref 4.22–5.81)
RDW: 15.1 % (ref 11.5–15.5)
WBC: 8.5 10*3/uL (ref 4.0–10.5)

## 2016-03-04 NOTE — Progress Notes (Signed)
Transitions of Care Pharmacy Note  Plan:  Educated on new medications: pulmicort, spiriva, and velphoro Recommend reinforcing compliance and use of new medications  Recommend resuming other home medication (lasix) when and if clinically indicated  Recommend discontinuing prednisone, when clinically appropriate/course is completed  Updated bedside nursing re: reinforcing teaching with inhalers, should he be d/c'd with these  --------------------------------------------- [name] is an 66 y.o. male who presents with a chief complaint COPD exacerbation and influenza. In anticipation of discharge, pharmacy has reviewed this patient's prior to admission medication history, as well as current inpatient medications listed per the [facility].  Current medication indications, dosing, frequency, and notable side effects reviewed with patient. Patient verbalized understanding of current inpatient medication regimen and is aware that the After Visit Summary when presented, will represent the most accurate medication list at discharge.   [name] did not express concerns. The patient was not very engaged in teaching and reports taking care of his own medications at home. He reports he lives alone. We discussed his new medications he is receiving inpatient, including pulmicort, spiriva, and velphoro. He does report some GI side effects, likely associated with velphoro.   While we talked about these aforementioned medications, I think he would benefit from re-education whenever possible to ensure compliance and correct use, if these are to be continued at discharge.   Assessment: Understanding of regimen: fair Understanding of indications: poor Potential of compliance: fair Barriers to Obtaining Medications: No  Patient instructed to contact inpatient pharmacy team with further questions or concerns if needed.    Time spent preparing for discharge counseling: 15 min  Time spent counseling patient: 20 min     Thank you for allowing pharmacy to be a part of this patient's care.  Argie Ramming, PharmD Pharmacy Resident  Pager [phone] 03/04/16 5:35 PM

## 2016-03-04 NOTE — Progress Notes (Signed)
Physical Therapy Treatment Patient Details Name: [name] MRN: [id number] DOB: [birth year] Today's Date: 03/04/2016    History of Present Illness 66 yo male with onset of flu was admitted with low O2 sats, PMHx:  ESRD, DM, COPD    PT Comments    Improving steadily.  Noticeably dyspneic when Sats are low.  Can maintain sats on RA during gait.   Follow Up Recommendations  No PT follow up     Equipment Recommendations  None recommended by PT    Recommendations for Other Services       Precautions / Restrictions Precautions Precautions: Fall    Mobility  Bed Mobility Overal bed mobility: Modified Independent                Transfers Overall transfer level: Modified independent                  Ambulation/Gait Ambulation/Gait assistance: Supervision Ambulation Distance (Feet): 45 Feet (60 feet then 100 feet pulling portable O2) Assistive device: None Gait Pattern/deviations: Step-through pattern Gait velocity: reduced Gait velocity interpretation: Below normal speed for age/gender General Gait Details: pt mildly unsteady at times, but no LOB.  Pt unable to maintain adequate oxygenation on RA.  On 4LNC, pt can maintain at least 90/91% during gait.   Stairs            Wheelchair Mobility    Modified Rankin (Stroke Patients Only)       Balance Overall balance assessment: Needs assistance   Sitting balance-Leahy Scale: Good       Standing balance-Leahy Scale: Fair                      Cognition Arousal/Alertness: Awake/alert Behavior During Therapy: WFL for tasks assessed/performed Overall Cognitive Status: Within Functional Limits for tasks assessed                      Exercises      General Comments General comments (skin integrity, edema, etc.): see oxygen qualification.  Pt down to 85% on RA after 2 min.  HR 90 bpm      Pertinent Vitals/Pain Pain Assessment: No/denies pain    Home Living                       Prior Function            PT Goals (current goals can now be found in the care plan section) Acute Rehab PT Goals Patient Stated Goal: to get some food and feel better PT Goal Formulation: With patient Time For Goal Achievement: 03/16/16 Potential to Achieve Goals: Good Progress towards PT goals: Progressing toward goals    Frequency    Min 3X/week      PT Plan Current plan remains appropriate    Co-evaluation             End of Session Equipment Utilized During Treatment: Oxygen Activity Tolerance: Patient tolerated treatment well;Treatment limited secondary to medical complications (Comment) Patient left: in bed;with call bell/phone within reach     Time: 1158-1216 PT Time Calculation (min) (ACUTE ONLY): 18 min  Charges:  $Gait Training: 8-22 mins                    G CodesTessie Fass Hermann Dottavio 03/04/2016, 12:22 PM  03/04/2016  Donnella Sham, PT [phone]  (pager)

## 2016-03-04 NOTE — Consult Note (Signed)
            [facility] CM Primary Care Navigator  03/04/2016  [name] 10/24/1950 [id number]   Went to see patient in the room to identify possible discharge needs but staff states that patient is currently in hemodialysis and would take about 3 hours for the procedure.  Will try another time to meet with patient when available.  For additional questions please contact:  Edwena Felty A. Shakur Lembo, BSN, RN-BC [facility] PRIMARY CARE Navigator Cell: [phone]

## 2016-03-04 NOTE — Progress Notes (Signed)
SATURATION QUALIFICATIONS: (This note is used to comply with regulatory documentation for home oxygen)  Patient Saturations on Room Air at Rest = 85%  Patient Saturations on Room Air while Ambulating = 83%  Patient Saturations on 4 Liters of oxygen while Ambulating = 91-95%  Please briefly explain why patient needs home oxygen.  Pt's SpO2 rises and is maintained in the low to mid 90's with lowering on pt's HR with supplementary oxygen is used. 03/04/2016  Donnella Sham, [place] [phone]  (pager)

## 2016-03-04 NOTE — Progress Notes (Deleted)
Subjective:  Tolerated hd yest  On schedule , breathing better still some wheezing   Objective Vital signs in last 24 hours: Vitals:   03/04/16 1100 03/04/16 1108 03/04/16 1149 03/04/16 1150  BP: 92/67 (!) 142/65  140/69  Pulse: 88 88    Resp:  20  18  Temp:  98.6 F (37 C)  98.2 F (36.8 C)  TempSrc:  Oral  Oral  SpO2:  99% 98% 98%  Weight:  85.5 kg (188 lb 7.9 oz)    Height:       Weight change: -0.8 kg (-1 lb 12.2 oz)  Physical Exam: General:Pleasant , alert , NAD,Ox3 Heart: RRR no m,r,g Lungs:Scattered Wheezing , non labored breathing  Abdomen: soft nt, nd Extremities:no pedal edema Dialysis Access: R ij pcath / pos bruit RFA AVF   Dialysis: [facility] MWF  4h 105min   2/2.5 bath  Hep 5000  TDC   88.5kg  - M100 last 1/10  - V50 weekly  - Calc 1.0 ug tiw      Problem/Plan 1. SOB/ cough/ COPD  - flu A+ With  underlying COPD, still hypoxic and wheezing.   On po pred/ nebs / levaquin 2. ESRD HD on schedule = MWF. HD today 3. Volume - wt's down 3kg under dry wt, bp's good 4. HTN = stable and Amlodipine 10 mg  qday - No  chg 5. Anemia= hgb 10.7 nest esa tomorrow Aranesp / wkly fe on hd  no chg 6. 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 No results for input(s): AMMONIA in the last 168 hours. CBC:  Recent Labs Lab 02/28/16 1451  02/29/16 0402 03/01/16 0404 03/02/16 0840 03/03/16 0453 03/04/16 0626  WBC 6.9  < >  6.4 6.2 6.6 6.9 8.5  NEUTROABS 4.8  --   --   --   --   --   --   HGB 11.7*  < > 10.3* 10.8* 10.8* 10.7* 10.6*  HCT 36.1*  < > 33.0* 34.4* 34.0* 33.5* 33.1*  MCV 100.0  < > 101.9* 101.8* 100.0 99.7 98.8  PLT 294  < > 281 255 267 277 287  < > = values in this interval not displayed. Cardiac Enzymes: No results for input(s): CKTOTAL, CKMB, CKMBINDEX, TROPONINI in the last 168 hours. CBG:  Recent Labs Lab 03/03/16 0810 03/03/16 1202 03/03/16 1630 03/03/16 2045 03/04/16 1135  GLUCAP 101* 159* 162* 205* 101*    Studies/Results: Dg Chest Port 1 View  Result Date: 03/02/2016 CLINICAL DATA:  Shortness of Breath EXAM: PORTABLE CHEST 1 VIEW COMPARISON:  02/28/2016 FINDINGS: Cardiac shadow is within normal limits. A dialysis catheter is again seen. The lungs are well aerated bilaterally. No focal infiltrate or sizable effusion is seen. No bony abnormality is noted. IMPRESSION: No active disease. Electronically Signed   By: Elta [place]  Lukens M.D.   On: 03/02/2016 20:49   Medications:  . amLODipine  10 mg Oral Daily  . atorvastatin  10 mg Oral Daily  . budesonide (PULMICORT) nebulizer solution  0.25 mg Nebulization BID  . darbepoetin (ARANESP) injection - DIALYSIS  60 mcg Intravenous Q Wed-HD  . feeding supplement (PRO-STAT SUGAR FREE 64)  30 mL Oral BID  . ferric gluconate (FERRLECIT/NULECIT) IV  62.5 mg Intravenous Weekly  . heparin  5,000 Units Subcutaneous Q8H  . insulin aspart  0-5 Units Subcutaneous QHS  . insulin aspart  0-9 Units Subcutaneous TID WC  . ipratropium-albuterol  3 mL Nebulization QID  . predniSONE  40 mg Oral Q breakfast  . sodium chloride flush  3 mL Intravenous Q12H  . sucroferric oxyhydroxide  500 mg Oral TID WC  . tamsulosin  0.4 mg Oral Daily  . tiotropium  18 mcg Inhalation Daily

## 2016-03-04 NOTE — Progress Notes (Signed)
Subjective:  Tolerated hd yest  On schedule , breathing better still some wheezing   Objective Vital signs in last 24 hours: Vitals:   03/04/16 1100 03/04/16 1108 03/04/16 1149 03/04/16 1150  BP: 92/67 (!) 142/65  140/69  Pulse: 88 88    Resp:  20  18  Temp:  98.6 F (37 C)  98.2 F (36.8 C)  TempSrc:  Oral  Oral  SpO2:  99% 98% 98%  Weight:  85.5 kg (188 lb 7.9 oz)    Height:       Weight change: -0.8 kg (-1 lb 12.2 oz)  Physical Exam: General:Pleasant , alert , NAD,Ox3 Heart: RRR no m,r,g Lungs:Scattered Wheezing , non labored breathing  Abdomen: soft nt, nd Extremities:no pedal edema Dialysis Access: R ij pcath / pos bruit RFA AVF   Dialysis: [facility] MWF  4h 56min   2/2.5 bath  Hep 5000  TDC   88.5kg  - M100 last 1/10  - V50 weekly  - Calc 1.0 ug tiw      Problem/Plan 1. SOB/ cough/ COPD  - flu A+ With  underlying COPD, still hypoxic and wheezing.   On po pred/ nebs / levaquin 2. ESRD HD on schedule = MWF. HD today 3. Volume - wt's down 3kg under dry wt, bp's good 4. HTN = stable and Amlodipine 10 mg  qday - No  chg 5. Anemia= hgb 10.7 nest esa tomorrow Aranesp / wkly fe on hd  no chg 6. 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 No results for input(s): AMMONIA in the last 168 hours. CBC:  Recent Labs Lab 02/28/16 1451  02/29/16 0402 03/01/16 0404 03/02/16 0840 03/03/16 0453 03/04/16 0626  WBC 6.9  < >  6.4 6.2 6.6 6.9 8.5  NEUTROABS 4.8  --   --   --   --   --   --   HGB 11.7*  < > 10.3* 10.8* 10.8* 10.7* 10.6*  HCT 36.1*  < > 33.0* 34.4* 34.0* 33.5* 33.1*  MCV 100.0  < > 101.9* 101.8* 100.0 99.7 98.8  PLT 294  < > 281 255 267 277 287  < > = values in this interval not displayed. Cardiac Enzymes: No results for input(s): CKTOTAL, CKMB, CKMBINDEX, TROPONINI in the last 168 hours. CBG:  Recent Labs Lab 03/03/16 0810 03/03/16 1202 03/03/16 1630 03/03/16 2045 03/04/16 1135  GLUCAP 101* 159* 162* 205* 101*    Studies/Results: Dg Chest Port 1 View  Result Date: 03/02/2016 CLINICAL DATA:  Shortness of Breath EXAM: PORTABLE CHEST 1 VIEW COMPARISON:  02/28/2016 FINDINGS: Cardiac shadow is within normal limits. A dialysis catheter is again seen. The lungs are well aerated bilaterally. No focal infiltrate or sizable effusion is seen. No bony abnormality is noted. IMPRESSION: No active disease. Electronically Signed   By: Elta [place]  Lukens M.D.   On: 03/02/2016 20:49   Medications:  . amLODipine  10 mg Oral Daily  . atorvastatin  10 mg Oral Daily  . budesonide (PULMICORT) nebulizer solution  0.25 mg Nebulization BID  . darbepoetin (ARANESP) injection - DIALYSIS  60 mcg Intravenous Q Wed-HD  . feeding supplement (PRO-STAT SUGAR FREE 64)  30 mL Oral BID  . ferric gluconate (FERRLECIT/NULECIT) IV  62.5 mg Intravenous Weekly  . heparin  5,000 Units Subcutaneous Q8H  . insulin aspart  0-5 Units Subcutaneous QHS  . insulin aspart  0-9 Units Subcutaneous TID WC  . ipratropium-albuterol  3 mL Nebulization QID  . predniSONE  40 mg Oral Q breakfast  . sodium chloride flush  3 mL Intravenous Q12H  . sucroferric oxyhydroxide  500 mg Oral TID WC  . tamsulosin  0.4 mg Oral Daily  . tiotropium  18 mcg Inhalation Daily

## 2016-03-04 NOTE — Progress Notes (Signed)
Family Medicine Teaching Service Daily Progress Note Intern Pager: [phone]  Patient name: [name] Medical record number: [id number] Date of birth: [birth year] Age: 66 y.o. Gender: male  Primary Care Provider: [facility] Consultants: Nephrology Code Status: Full  Pt Overview and Major Events to Date:  01/19: Admit for flu and COPD exacerbation 01/20: HD 01/22: HD 01/24: HD  Assessment and Plan: [name] a 65 y.o.male with a past medical history significant for ESRD, HTN, DM, COPD, tobacco use presenting with cough, shortness of breath and increased sputum production found to have + flu also concern for COPD exacerbation.   #Respiratory Distress with Influenza Infection: Acute, improving. Currently on 4 L. Patient ABG showed some mild hypoxia and CXR was unremarkable. Given concomitant influenza infection with COPD, will be slow to discharge given severity. Will likely need home O2 upon discharge given frequent use during dialysis sessions outpatient. --Continue supplemental oxygen as needed --Continue droplet precautions --S/p Tamiflu 30 mg BID last day 1/23, renally dosed  #Dyspnea with hypoxia, stable: Acute, improving. Likely secondary to influenza and COPD exacerbation. He has some transmitted upper airway sounds on exam which concerns me for sleep apnea. He is on 5 L [place]. Will be weaned from oxygen as much as tolerated by patient but most likely will be d/c on some oxygen. --S/p Prednisone 40 mg QD, last day 1/23 --S/p Levoquin 500 mg q48h last day 1/23 renally dosed --S/p Tamiflu 30 mg BID last day 1/23 renally dosed --Duonebs q6h, abluterol q2h PRN --Supplemental O2 as above --May have a baseline O2 requirement, assess prior to discharge --Continue Spiriva --Continue Pulmicort  --Consider Sleep study as outpatient  --Will ambulate to evaluate for home O2 --PT consultation, no follow-up needed  #HFpEF: Chronic, stable. Last Echo in 02/2015 with EF of  60-65%, G2DD. He doesn't appear to be fluid overloaded at this time. His weight is the lowest it has been in years. Weight down 192 lb from 202 on admission. --Continue monitoring  #ESRD: Chronic, stable. He had missed dialysis on 1/19 (scheduled M/W/F), but was dialyzed 1/20. Today he does not appear fluid overloaded on exam. Weight is stable.  --HD today --Following renal function daily --Nephrology following, appreciate recommendations  --Continue home lasix 20 mg QD --Continue 30 ml Prostat po BID per nutrition  #Non-insulin-dependent diabetes mellitus: Chronic, stable. On glipizide at home and Hgb A1c on 02/28/16 was 5.5 --Sensitive SSI, will continue while on steroids inpatient --ACHS CBGs  #Hypertension: Chronic, stable. Normotensive at this time.  --Continue norvasc 10 mg daily  #Urinary retention: Chronic, stable. --Continue tamsulosin 0.4 mg --Bladder scan (post void) as needed  FEN/GI: Heart healthy/carb modified diet Prophylaxis: Heparin SQ  Disposition: Home vs SNF   Subjective:  Patient receiving dialysis saying he continues to feel weak. Dyspnea has not improved since yesterday. Denies chest pain, palpitations, nausea or vomiting, abdominal pain.  Objective: Temp:  [97.4 F (36.3 C)-98.6 F (37 C)] 98.6 F (37 C) (01/23 2048) Pulse Rate:  [84-88] 86 (01/23 2048) Resp:  [16-18] 18 (01/23 2048) BP: (107-136)/(61-70) 136/70 (01/23 2048) SpO2:  [96 %-97 %] 97 % (01/23 2256)   Physical Exam: General: elderly male laying in bed receiving dialysis, no acute distress with non-toxic appearance HEENT: normocephalic, atraumatic, moist mucous membranes Neck: supple, non-tender without lymphadenopathy CV: regular rate and rhythm without murmurs, rubs, or gallops, non-edematous Lungs: transmitted upper airway sounds bilaterally with normal work of breathing on 4 L [place], decreased movement of air throughout  lung fields bilaterally Abdomen: soft, non-tender, normoactive  bowel sounds Skin: warm, dry, no rashes or lesions, cap refill < 2 seconds Extremities: warm and well perfused, normal tone  Laboratory:  Recent Labs Lab 03/01/16 0404 03/02/16 0840 03/03/16 0453  WBC 6.2 6.6 6.9  HGB 10.8* 10.8* 10.7*  HCT 34.4* 34.0* 33.5*  PLT 255 267 277    Recent Labs Lab 03/01/16 0404 03/02/16 0840 03/03/16 0453  NA 131* 132* 130*  K 4.3 3.5 4.0  CL 98* 97* 96*  CO2 23 25 25   BUN 25* 46* 30*  CREATININE 4.82* 6.49* 4.78*  CALCIUM 8.0* 7.9* 8.1*  GLUCOSE 129* 81 119*   Influenza: Positive strain A Lactic acid: 2.1>3.1>1.0>0.6 A1c: 5.5 ABG: pH 7.269, pCO2 52.4, pCO2 68.2, bicarb 23.2, O2 89.9%  Imaging/Diagnostic Tests: DG Chest Port 1 View (03/02/2016) FINDINGS: Cardiac shadow is within normal limits. A dialysis catheter is again seen. The lungs are well aerated bilaterally. No focal infiltrate or sizable effusion is seen. No bony abnormality is noted.  IMPRESSION: No active disease.  DG Chest 2 View (02/28/2016) FINDINGS: Interval placement of a dual-lumen right-sided central venous catheter with the tip projecting over the cavoatrial junction. There is no focal parenchymal opacity. There is no pleural effusion or pneumothorax. The heart and mediastinal contours are unremarkable. The osseous structures are unremarkable.  IMPRESSION: No active cardiopulmonary disease.     Harriet Butte, DO [facility], PGY-1 03/04/2016 6:24 AM

## 2016-03-05 LAB — GLUCOSE, CAPILLARY
GLUCOSE-CAPILLARY: 161 mg/dL — AB (ref 65–99)
Glucose-Capillary: 96 mg/dL (ref 65–99)

## 2016-03-05 LAB — RENAL FUNCTION PANEL
ANION GAP: 10 (ref 5–15)
Albumin: 2.7 g/dL — ABNORMAL LOW (ref 3.5–5.0)
BUN: 37 mg/dL — ABNORMAL HIGH (ref 6–20)
CALCIUM: 8.2 mg/dL — AB (ref 8.9–10.3)
CHLORIDE: 98 mmol/L — AB (ref 101–111)
CO2: 24 mmol/L (ref 22–32)
Creatinine, Ser: 4.34 mg/dL — ABNORMAL HIGH (ref 0.61–1.24)
GFR calc Af Amer: 15 mL/min — ABNORMAL LOW (ref >60)
GFR calc non Af Amer: 13 mL/min — ABNORMAL LOW (ref >60)
GLUCOSE: 116 mg/dL — AB (ref 65–99)
Phosphorus: 3.9 mg/dL (ref 2.5–4.6)
Potassium: 4.6 mmol/L (ref 3.5–5.1)
SODIUM: 132 mmol/L — AB (ref 135–145)

## 2016-03-05 LAB — CBC
HEMATOCRIT: 35 % — AB (ref 39.0–52.0)
Hemoglobin: 11.1 g/dL — ABNORMAL LOW (ref 13.0–17.0)
MCH: 31.8 pg (ref 26.0–34.0)
MCHC: 31.7 g/dL (ref 30.0–36.0)
MCV: 100.3 fL — AB (ref 78.0–100.0)
Platelets: 259 10*3/uL (ref 150–400)
RBC: 3.49 MIL/uL — AB (ref 4.22–5.81)
RDW: 15.5 % (ref 11.5–15.5)
WBC: 11.4 10*3/uL — AB (ref 4.0–10.5)

## 2016-03-05 MED ORDER — ALBUTEROL SULFATE HFA 108 (90 BASE) MCG/ACT IN AERS: 1.0000 | INHALATION_SPRAY | Freq: Four times a day (QID) | RESPIRATORY_TRACT | 0 refills | Status: AC | PRN

## 2016-03-05 MED ORDER — TIOTROPIUM BROMIDE MONOHYDRATE 18 MCG IN CAPS: 18.0000 ug | ORAL_CAPSULE | Freq: Every day | RESPIRATORY_TRACT | 0 refills | Status: DC

## 2016-03-05 MED ORDER — PRO-STAT SUGAR FREE PO LIQD: 30.0000 mL | Freq: Two times a day (BID) | ORAL | 0 refills | Status: AC

## 2016-03-05 MED ORDER — BUDESONIDE 90 MCG/ACT IN AEPB: 1.0000 | INHALATION_SPRAY | Freq: Two times a day (BID) | RESPIRATORY_TRACT | 0 refills | Status: DC

## 2016-03-05 NOTE — Care Management Note (Signed)
Case Management Note  Patient Details  Name: [name] MRN: [id number] Date of Birth: 17-Dec-1950  Subjective/Objective:          CM following for progression and d/c planning.          Action/Plan: 03/05/2016  This CM notified of pt need for home oxygen. [place] notified of order and qualifying sats.  Oxygen delivered to pt room, and pt for d/c to home. No other HH or  DME orders noted.   Expected Discharge Date:  03/05/16               Expected Discharge Plan:  Home/Self Care  In-House Referral:  NA  Discharge planning Services  CM Consult  Post Acute Care Choice:  Durable Medical Equipment Choice offered to:  Patient  DME Arranged:  Oxygen DME Agency:  [place]:  NA [place] Agency:  NA  Status of Service:  Completed, signed off  If discussed at [place] of Stay Meetings, dates discussed:    Additional Comments:  Adron Bene, RN 03/05/2016, 4:50 PM

## 2016-03-05 NOTE — Progress Notes (Signed)
O2 93% while ambulating on 2 L/min oxygen.

## 2016-03-05 NOTE — Progress Notes (Signed)
Discharge instructions and medications discussed with patient.  All questions answered. Oxygen tank delivered to room.

## 2016-03-05 NOTE — Progress Notes (Signed)
Family Medicine Teaching Service Daily Progress Note Intern Pager: [phone]  Patient name: [name] Medical record number: [id number] Date of birth: 1950/06/06 Age: 66 y.o. Gender: male  Primary Care Provider: [facility] Consultants: Nephrology Code Status: Full  Pt Overview and Major Events to Date:  01/19: Admit for flu and COPD exacerbation 01/20: HD 01/22: HD 01/24: HD  Assessment and Plan: [name] a 65 y.o.male with a past medical history significant for ESRD, HTN, DM, COPD, tobacco use presenting with cough, shortness of breath and increased sputum production found to have + flu also concern for COPD exacerbation.   #Respiratory Distress with Influenza Infection: Acute, improving. Currently on 4 L. Patient ABG showed some mild hypoxia and CXR was unremarkable. Given concomitant influenza infection with COPD, will be slow to discharge given severity. Will likely need home O2 upon discharge given frequent use during dialysis sessions outpatient. --Continue supplemental oxygen as needed --Continue droplet precautions --S/p Tamiflu 30 mg BID last day 1/23, renally dosed  #Dyspnea with hypoxia, stable: Acute, improving. Likely secondary to influenza and COPD exacerbation. He has some transmitted upper airway sounds on exam which concerns me for sleep apnea. He is on 5 L [place]. Will be weaned from oxygen as much as tolerated by patient but most likely will be d/c on some oxygen. --S/p Prednisone 40 mg QD, last day 1/23 --S/p Levoquin 500 mg q48h last day 1/23 renally dosed --S/p Tamiflu 30 mg BID last day 1/23 renally dosed --Duonebs q6h, abluterol q2h PRN --Supplemental O2 as above --May have a baseline O2 requirement, assess prior to discharge --Continue Spiriva --Continue Pulmicort  --Consider Sleep study as outpatient  --Will ambulate to evaluate for home O2 --PT consultation, no follow-up needed  #HFpEF: Chronic, stable. Last Echo in 02/2015 with EF of  60-65%, G2DD. He doesn't appear to be fluid overloaded at this time. His weight is the lowest it has been in years. Weight down 192 lb from 202 on admission. --Continue monitoring  #ESRD: Chronic, stable. He had missed dialysis on 1/19 (scheduled M/W/F), but was dialyzed 1/20. Today he does not appear fluid overloaded on exam. Weight is stable.  --HD M/W/F --Following renal function daily --Nephrology following, appreciate recommendations  --Continue home lasix 20 mg QD --Continue 30 ml Prostat po BID per nutrition  #Non-insulin-dependent diabetes mellitus: Chronic, stable. On glipizide at home and Hgb A1c on 02/28/16 was 5.5 --Sensitive SSI, will continue while on steroids inpatient --ACHS CBGs  #Hypertension: Chronic, stable. Normotensive at this time.  --Continue norvasc 10 mg daily  #Urinary retention: Chronic, stable. --Continue tamsulosin 0.4 mg --Bladder scan (post void) as needed  FEN/GI: Heart healthy/carb modified diet Prophylaxis: Heparin SQ  Disposition: Home vs SNF   Subjective:  Patient says he is doing much better this morning. When feeling less weak and not as short of breath. Denies symptoms of chest pain, nausea or vomiting, abdominal pain. Says he feels okay for discharge.  Objective: Temp:  [98.2 F (36.8 C)-99.1 F (37.3 C)] 98.6 F (37 C) (01/25 0442) Pulse Rate:  [81-103] 91 (01/25 0442) Resp:  [18-21] 21 (01/25 0442) BP: (92-164)/(49-92) 148/76 (01/25 0442) SpO2:  [93 %-100 %] 93 % (01/25 0442) FiO2 (%):  [36 %] 36 % (01/24 1502) Weight:  [188 lb 7.9 oz (85.5 kg)-191 lb 12.8 oz (87 kg)] 188 lb 7.9 oz (85.5 kg) (01/24 1108)   Physical Exam: General: elderly male laying in bed receiving dialysis, no acute distress with non-toxic appearance HEENT:  normocephalic, atraumatic, moist mucous membranes Neck: supple, non-tender without lymphadenopathy CV: regular rate and rhythm without murmurs, rubs, or gallops, non-edematous Lungs: transmitted upper  airway sounds bilaterally with normal work of breathing on 2 L [place], decreased movement of air throughout lung fields bilaterally Abdomen: soft, non-tender, normoactive bowel sounds Skin: warm, dry, no rashes or lesions, cap refill < 2 seconds Extremities: warm and well perfused, normal tone  Laboratory:  Recent Labs Lab 03/02/16 0840 03/03/16 0453 03/04/16 0626  WBC 6.6 6.9 8.5  HGB 10.8* 10.7* 10.6*  HCT 34.0* 33.5* 33.1*  PLT 267 277 287    Recent Labs Lab 03/02/16 0840 03/03/16 0453 03/04/16 0626  NA 132* 130* 132*  K 3.5 4.0 3.9  CL 97* 96* 97*  CO2 25 25 25   BUN 46* 30* 52*  CREATININE 6.49* 4.78* 6.31*  CALCIUM 7.9* 8.1* 8.2*  GLUCOSE 81 119* 91   Influenza: Positive strain A Lactic acid: 2.1>3.1>1.0>0.6 A1c: 5.5 ABG: pH 7.269, pCO2 52.4, pCO2 68.2, bicarb 23.2, O2 89.9%  Imaging/Diagnostic Tests: DG Chest Port 1 View (03/02/2016) FINDINGS: Cardiac shadow is within normal limits. A dialysis catheter is again seen. The lungs are well aerated bilaterally. No focal infiltrate or sizable effusion is seen. No bony abnormality is noted.  IMPRESSION: No active disease.  DG Chest 2 View (02/28/2016) FINDINGS: Interval placement of a dual-lumen right-sided central venous catheter with the tip projecting over the cavoatrial junction. There is no focal parenchymal opacity. There is no pleural effusion or pneumothorax. The heart and mediastinal contours are unremarkable. The osseous structures are unremarkable.  IMPRESSION: No active cardiopulmonary disease.     Harriet Butte, DO [facility], PGY-1 03/05/2016 6:57 AM

## 2016-03-05 NOTE — Discharge Instructions (Signed)
You were admitted for COPD exacerbation and found to have the flu. This was likely the source of your exacerbation. You required oxygen therapy while at the hospital. We were able to wean it down to 2 L but he will need to continue this upon discharge at home. This will help better control your COPD and prevent future exacerbations. I have also given new prescription for controller medications including Pulmicort, Pulmicort, and albuterol inhaler. Please take these as instructed. We can restart her glipizide for your diabetes at home. Please schedule a hospital follow-up appointment with your PCP upon discharge. Continue your dialysis treatments every Monday/Wednesday/Friday as scheduled. Please do not miss these appointments as this can lead to another hospitalization.   Chronic Obstructive Pulmonary Disease Exacerbation Chronic obstructive pulmonary disease (COPD) is a common lung problem. In COPD, the flow of air from the lungs is limited. COPD exacerbations are times that breathing gets worse and you need extra treatment. Without treatment they can be life threatening. If they happen often, your lungs can become more damaged. If your COPD gets worse, your doctor may treat you with:  Medicines.  Oxygen.  Different ways to clear your airway, such as using a mask. Follow these instructions at home:  Do not smoke.  Avoid tobacco smoke and other things that bother your lungs.  If given, take your antibiotic medicine as told. Finish the medicine even if you start to feel better.  Only take medicines as told by your doctor.  Drink enough fluids to keep your pee (urine) clear or pale yellow (unless your doctor has told you not to).  Use a cool mist machine (vaporizer).  If you use oxygen or a machine that turns liquid medicine into a mist (nebulizer), continue to use them as told.  Keep up with shots (vaccinations) as told by your doctor.  Exercise regularly.  Eat healthy foods.  Keep  all doctor visits as told. Get help right away if:  You are very short of breath and it gets worse.  You have trouble talking.  You have bad chest pain.  You have blood in your spit (sputum).  You have a fever.  You keep throwing up (vomiting).  You feel weak, or you pass out (faint).  You feel confused.  You keep getting worse. This information is not intended to replace advice given to you by your health care provider. Make sure you discuss any questions you have with your health care provider. Document Released: 01/15/2011 Document Revised: 07/04/2015 Document Reviewed: 09/30/2012 Elsevier Interactive Patient Education  2017 [employer].

## 2016-03-05 NOTE — Progress Notes (Signed)
Subjective:  Feeling better today   Objective Vital signs in last 24 hours: Vitals:   03/05/16 0442 03/05/16 0740 03/05/16 1001 03/05/16 1131  BP: (!) 148/76  (!) 148/67   Pulse: 91     Resp: (!) 21  18   Temp: 98.6 F (37 C)  98 F (36.7 C)   TempSrc:   Oral   SpO2: 93% 99% 99% 96%  Weight:      Height:       Weight change: -1.5 kg (-3 lb 4.9 oz)  Physical Exam: General:Pleasant , alert , NAD,Ox3 Heart: RRR no m,r,g Lungs: diffuse mild exp wheezing, improving Abdomen: soft nt, nd Extremities:no pedal edema Dialysis Access: R ij pcath / pos bruit RFA AVF   Dialysis: [facility] MWF  4h 4min   2/2.5 bath  Hep 5000  TDC   88.5kg  - M100 last 1/10  - V50 weekly  - Calc 1.0 ug tiw      Assessment: 1. SOB/ cough/ COPD  - flu A+, on pred/ nebs / levaquin 2. ESRD HD mwf 3. Volume - bp's good, under dry wt 4. HTN = stable and Amlodipine 10 mg  qday - No  chg 5. 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    Kelly Splinter MD [facility] pager [phone]   03/05/2016, 1:33 PM    Labs: Basic Metabolic Panel:  Recent Labs Lab 03/03/16 0453 03/04/16 0626 03/05/16 0614  NA 130* 132* 132*  K 4.0 3.9 4.6  CL 96* 97* 98*  CO2 25 25 24   GLUCOSE 119* 91 116*  BUN 30* 52* 37*  CREATININE 4.78* 6.31* 4.34*  CALCIUM 8.1* 8.2* 8.2*  PHOS 4.8* 5.8* 3.9   Liver Function Tests:  Recent Labs Lab 03/03/16 0453 03/04/16 0626 03/05/16 0614  ALBUMIN 2.9* 2.8* 2.7*   No results for input(s): LIPASE, AMYLASE in the last 168 hours. No results for input(s): AMMONIA in the last 168 hours. CBC:  Recent Labs Lab 02/28/16 1451  03/01/16 0404 03/02/16 0840 03/03/16 0453 03/04/16 0626 03/05/16 0614  WBC 6.9  < > 6.2 6.6 6.9 8.5 11.4*  NEUTROABS 4.8  --   --   --   --   --   --   HGB 11.7*  < > 10.8* 10.8* 10.7* 10.6* 11.1*  HCT 36.1*  < > 34.4* 34.0*  33.5* 33.1* 35.0*  MCV 100.0  < > 101.8* 100.0 99.7 98.8 100.3*  PLT 294  < > 255 267 277 287 259  < > = values in this interval not displayed. Cardiac Enzymes: No results for input(s): CKTOTAL, CKMB, CKMBINDEX, TROPONINI in the last 168 hours. CBG:  Recent Labs Lab 03/04/16 1135 03/04/16 1652 03/04/16 2133 03/05/16 0801 03/05/16 1159  GLUCAP 101* 242* 240* 96 161*    Studies/Results: No results found. Medications:  . amLODipine  10 mg Oral Daily  . atorvastatin  10 mg Oral Daily  . budesonide (PULMICORT) nebulizer solution  0.25 mg Nebulization BID  . darbepoetin (ARANESP) injection - DIALYSIS  60 mcg Intravenous Q Wed-HD  . feeding supplement (PRO-STAT SUGAR FREE 64)  30 mL Oral BID  . ferric gluconate (FERRLECIT/NULECIT) IV  62.5 mg Intravenous Weekly  . heparin  5,000 Units Subcutaneous Q8H  . insulin aspart  0-5 Units Subcutaneous QHS  . insulin aspart  0-9 Units Subcutaneous TID WC  . ipratropium-albuterol  3 mL Nebulization QID  . predniSONE  40 mg Oral Q breakfast  . sodium chloride flush  3 mL Intravenous Q12H  . sucroferric oxyhydroxide  500 mg Oral TID WC  . tamsulosin  0.4 mg Oral Daily  . tiotropium  18 mcg Inhalation Daily

## 2016-03-10 ENCOUNTER — Telehealth: Payer: Self-pay | Admitting: Emergency Medicine

## 2016-03-10 ENCOUNTER — Institutional Professional Consult (permissible substitution): Payer: Medicare Other | Admitting: Emergency Medicine

## 2016-03-10 NOTE — Telephone Encounter (Signed)
Spoke with patient while looking in his chart. RB has never seen patient nor prescribed medication for patient. Advised patient to call the hospital and speak with their staff regarding medications. Patient verbalized understanding. Nothing else was needed.

## 2016-03-11 DIAGNOSIS — E1122 Type 2 diabetes mellitus with diabetic chronic kidney disease: Secondary | ICD-10-CM | POA: Diagnosis not present

## 2016-03-11 DIAGNOSIS — Z992 Dependence on renal dialysis: Secondary | ICD-10-CM | POA: Diagnosis not present

## 2016-03-11 DIAGNOSIS — N186 End stage renal disease: Secondary | ICD-10-CM | POA: Diagnosis not present

## 2016-03-12 NOTE — ED Provider Notes (Signed)
[facility] DEPT Provider Note   CSN: [id number] Arrival date & time: 02/28/16  1434     History   Chief Complaint Chief Complaint  Patient presents with  . Influenza  . Shortness of Breath    HPI [name] is a 66 y.o. male. Complaint shortness of breath  HPI is a dialysis patient. Normally gets 3 times a week dialysis. Did not get analysis a because he felt poorly. He has had flulike symptoms with fever chills and a cough at home. Was given nebulized abnormal in route and states he does have some improvement.  Occasion productive of thin yellow sputum. No GI complaints. No chest pain.  Past Medical History:  Diagnosis Date  . Cancer [facility])    prostate  . Chronic kidney disease    Dialysis M/w/F  . COPD (chronic obstructive pulmonary disease) ([place])   . Diabetes mellitus    Type 2   . GERD (gastroesophageal reflux disease)   . High cholesterol   . Hypertension   . Shortness of breath dyspnea     Patient Active Problem List   Diagnosis Date Noted  . Acute respiratory distress   . End stage renal disease on dialysis ([place])   . Influenza 02/28/2016  . Acute respiratory failure with hypoxia ([place]) 03/14/2015  . Acute renal failure superimposed on stage 4 chronic kidney disease ([place]) 03/13/2015  . Acute renal failure (ARF) ([place]) 03/09/2015  . COPD exacerbation ([place])   . Diabetes mellitus ([place]) 03/08/2015  . Anemia 03/08/2015  . Acute on chronic renal failure ([place]) 03/08/2015  . Urinary retention 03/08/2015  . Kidney lesion, native, left 03/08/2015  . Pericardial effusion 03/08/2015  . COPD (chronic obstructive pulmonary disease) ([place]) 03/08/2015  . Tobacco abuse 03/08/2015  . CHRONIC KIDNEY DISEASE STAGE III (MODERATE) 11/06/2009  . ELEVATED PROSTATE SPECIFIC ANTIGEN 11/04/2009  . HYPERLIPIDEMIA 10/10/2009  . ERECTILE DYSFUNCTION 10/10/2009  . Essential hypertension 10/10/2009  . PREDIABETES 10/10/2009  . COLONIC POLYPS, HX OF 10/10/2009    Past Surgical  History:  Procedure Laterality Date  . AV FISTULA PLACEMENT Left 03/13/2015   Procedure: Left arm basilic vein transposition .;  Surgeon: Elam Dutch, MD;  Location: [place];  Service: Vascular;  Laterality: Left;  . AV FISTULA PLACEMENT Right 02/18/2016   Procedure: RADIOCEPHALIC ARTERIOVENOUS (AV) FISTULA CREATION;  Surgeon: Waynetta Sandy, MD;  Location: [place];  Service: Vascular;  Laterality: Right;  . COLON RESECTION    . KNEE SURGERY    . TOTAL HIP ARTHROPLASTY     bilat  . TOTAL SHOULDER ARTHROPLASTY     right       Home Medications    Prior to Admission medications   Medication Sig Start Date End Date Taking? Authorizing Provider  amLODipine (NORVASC) 10 MG tablet Take 10 mg by mouth daily.   Yes Historical Provider, MD  atorvastatin (LIPITOR) 20 MG tablet Take 10 mg by mouth daily.   Yes Historical Provider, MD  furosemide (LASIX) 20 MG tablet Take 1 tablet (20 mg total) by mouth daily. 03/14/15  Yes Orson Eva, MD  glipiZIDE (GLUCOTROL) 10 MG tablet Take 1 tablet (10 mg total) by mouth daily before breakfast. 03/14/15  Yes Orson Eva, MD  oxyCODONE-acetaminophen (ROXICET) 5-325 MG tablet Take 1-2 tablets by mouth every 6 (six) hours as needed for moderate pain or severe pain. 02/18/16  Yes Waynetta Sandy, MD  tamsulosin (FLOMAX) 0.4 MG CAPS capsule Take 0.4 mg by mouth daily.   Yes  Historical Provider, MD  albuterol (PROVENTIL HFA;VENTOLIN HFA) 108 (90 Base) MCG/ACT inhaler Inhale 1-2 puffs into the lungs every 6 (six) hours as needed for wheezing. 03/05/16   Elberta Leatherwood, MD  Amino Acids-Protein Hydrolys (FEEDING SUPPLEMENT, PRO-STAT SUGAR FREE 64,) LIQD Take 30 mLs by mouth 2 (two) times daily. 03/05/16 04/04/16  Elberta Leatherwood, MD  Budesonide (PULMICORT FLEXHALER) 90 MCG/ACT inhaler Inhale 1 puff into the lungs 2 (two) times daily. 03/05/16   Elberta Leatherwood, MD  tiotropium (SPIRIVA) 18 MCG inhalation capsule Place 1 capsule (18 mcg total) into inhaler and inhale daily.  03/06/16   Elberta Leatherwood, MD    Family History Family History  Problem Relation Age of Onset  . Heart failure Mother   . CAD Father   . Prostate cancer Neg Hx   . Kidney cancer Neg Hx     Social History Social History  Substance Use Topics  . Smoking status: Current Some Day Smoker    Packs/day: 0.03    Types: Cigarettes  . Smokeless tobacco: Never Used  . Alcohol use No     Allergies   Pravastatin and Simvastatin   Review of Systems Review of Systems  Constitutional: Positive for chills and fever. Negative for appetite change, diaphoresis and fatigue.  HENT: Negative for mouth sores, sore throat and trouble swallowing.   Eyes: Negative for visual disturbance.  Respiratory: Positive for cough and wheezing. Negative for chest tightness and shortness of breath.   Cardiovascular: Negative for chest pain.  Gastrointestinal: Negative for abdominal distention, abdominal pain, diarrhea, nausea and vomiting.  Endocrine: Negative for polydipsia, polyphagia and polyuria.  Genitourinary: Negative for dysuria, frequency and hematuria.  Musculoskeletal: Negative for gait problem.  Skin: Negative for color change, pallor and rash.  Neurological: Negative for dizziness, syncope, light-headedness and headaches.  Hematological: Does not bruise/bleed easily.  Psychiatric/Behavioral: Negative for behavioral problems and confusion.     Physical Exam Updated Vital Signs BP (!) 148/67   Pulse 91   Temp 98 F (36.7 C) (Oral)   Resp 18   Ht 5\' 11"  (1.803 m)   Wt 188 lb 7.9 oz (85.5 kg) Comment: stood to scale   SpO2 95%   BMI 26.29 kg/m   Physical Exam  Constitutional: He is oriented to person, place, and time. He appears well-developed and well-nourished. No distress.  HENT:  Head: Normocephalic.  Eyes: Conjunctivae are normal. Pupils are equal, round, and reactive to light. No scleral icterus.  Neck: Normal range of motion. Neck supple. No thyromegaly present.  Cardiovascular:  Normal rate and regular rhythm.  Exam reveals no gallop and no friction rub.   No murmur heard. Pulmonary/Chest: No respiratory distress. He has no wheezes. He has no rales.  A few scattered rhonchi. Minimal prolongation. No focal diminished breath sounds. No crackles.  Abdominal: Soft. Bowel sounds are normal. He exhibits no distension. There is no tenderness. There is no rebound.  Musculoskeletal: Normal range of motion.  Neurological: He is alert and oriented to person, place, and time.  Skin: Skin is warm and dry. No rash noted.  Psychiatric: He has a normal mood and affect. His behavior is normal.     ED Treatments / Results  Labs (all labs ordered are listed, but only abnormal results are displayed) Labs Reviewed  CBC WITH DIFFERENTIAL/PLATELET - Abnormal; Notable for the following:       Result Value   RBC 3.61 (*)    Hemoglobin 11.7 (*)  HCT 36.1 (*)    RDW 16.4 (*)    All other components within normal limits  INFLUENZA PANEL BY PCR (TYPE A & B) - Abnormal; Notable for the following:    Influenza A By PCR POSITIVE (*)    All other components within normal limits  BASIC METABOLIC PANEL - Abnormal; Notable for the following:    Chloride 99 (*)    Glucose, Bld 182 (*)    BUN 36 (*)    Creatinine, Ser 6.78 (*)    Calcium 8.2 (*)    GFR calc non Af Amer 7 (*)    GFR calc Af Amer 9 (*)    All other components within normal limits  BLOOD GAS, ARTERIAL - Abnormal; Notable for the following:    pH, Arterial 7.269 (*)    pCO2 arterial 52.4 (*)    pO2, Arterial 68.2 (*)    Acid-base deficit 2.7 (*)    All other components within normal limits  LACTIC ACID, PLASMA - Abnormal; Notable for the following:    Lactic Acid, Venous 2.1 (*)    All other components within normal limits  LACTIC ACID, PLASMA - Abnormal; Notable for the following:    Lactic Acid, Venous 3.1 (*)    All other components within normal limits  CBC - Abnormal; Notable for the following:    RBC 3.55 (*)     Hemoglobin 11.4 (*)    HCT 36.1 (*)    MCV 101.7 (*)    RDW 16.4 (*)    All other components within normal limits  CREATININE, SERUM - Abnormal; Notable for the following:    Creatinine, Ser 7.12 (*)    GFR calc non Af Amer 7 (*)    GFR calc Af Amer 8 (*)    All other components within normal limits  CBC - Abnormal; Notable for the following:    RBC 3.24 (*)    Hemoglobin 10.3 (*)    HCT 33.0 (*)    MCV 101.9 (*)    RDW 16.4 (*)    All other components within normal limits  RENAL FUNCTION PANEL - Abnormal; Notable for the following:    Chloride 100 (*)    CO2 21 (*)    Glucose, Bld 169 (*)    BUN 42 (*)    Creatinine, Ser 7.27 (*)    Calcium 8.3 (*)    Phosphorus 7.6 (*)    Albumin 2.7 (*)    GFR calc non Af Amer 7 (*)    GFR calc Af Amer 8 (*)    All other components within normal limits  GLUCOSE, CAPILLARY - Abnormal; Notable for the following:    Glucose-Capillary 312 (*)    All other components within normal limits  RENAL FUNCTION PANEL - Abnormal; Notable for the following:    Sodium 131 (*)    Chloride 98 (*)    Glucose, Bld 129 (*)    BUN 25 (*)    Creatinine, Ser 4.82 (*)    Calcium 8.0 (*)    Phosphorus 5.3 (*)    Albumin 2.8 (*)    GFR calc non Af Amer 11 (*)    GFR calc Af Amer 13 (*)    All other components within normal limits  CBC - Abnormal; Notable for the following:    RBC 3.38 (*)    Hemoglobin 10.8 (*)    HCT 34.4 (*)    MCV 101.8 (*)    RDW 16.0 (*)  All other components within normal limits  GLUCOSE, CAPILLARY - Abnormal; Notable for the following:    Glucose-Capillary 217 (*)    All other components within normal limits  GLUCOSE, CAPILLARY - Abnormal; Notable for the following:    Glucose-Capillary 112 (*)    All other components within normal limits  GLUCOSE, CAPILLARY - Abnormal; Notable for the following:    Glucose-Capillary 102 (*)    All other components within normal limits  RENAL FUNCTION PANEL - Abnormal; Notable for the  following:    Sodium 132 (*)    Chloride 97 (*)    BUN 46 (*)    Creatinine, Ser 6.49 (*)    Calcium 7.9 (*)    Phosphorus 5.2 (*)    Albumin 2.8 (*)    GFR calc non Af Amer 8 (*)    GFR calc Af Amer 9 (*)    All other components within normal limits  CBC - Abnormal; Notable for the following:    RBC 3.40 (*)    Hemoglobin 10.8 (*)    HCT 34.0 (*)    RDW 15.8 (*)    All other components within normal limits  GLUCOSE, CAPILLARY - Abnormal; Notable for the following:    Glucose-Capillary 164 (*)    All other components within normal limits  GLUCOSE, CAPILLARY - Abnormal; Notable for the following:    Glucose-Capillary 152 (*)    All other components within normal limits  GLUCOSE, CAPILLARY - Abnormal; Notable for the following:    Glucose-Capillary 168 (*)    All other components within normal limits  GLUCOSE, CAPILLARY - Abnormal; Notable for the following:    Glucose-Capillary 117 (*)    All other components within normal limits  GLUCOSE, CAPILLARY - Abnormal; Notable for the following:    Glucose-Capillary 193 (*)    All other components within normal limits  RENAL FUNCTION PANEL - Abnormal; Notable for the following:    Sodium 130 (*)    Chloride 96 (*)    Glucose, Bld 119 (*)    BUN 30 (*)    Creatinine, Ser 4.78 (*)    Calcium 8.1 (*)    Phosphorus 4.8 (*)    Albumin 2.9 (*)    GFR calc non Af Amer 11 (*)    GFR calc Af Amer 13 (*)    All other components within normal limits  CBC - Abnormal; Notable for the following:    RBC 3.36 (*)    Hemoglobin 10.7 (*)    HCT 33.5 (*)    All other components within normal limits  BLOOD GAS, ARTERIAL - Abnormal; Notable for the following:    pH, Arterial 7.345 (*)    pCO2 arterial 53.1 (*)    pO2, Arterial 65.1 (*)    Bicarbonate 28.2 (*)    Acid-Base Excess 3.0 (*)    All other components within normal limits  GLUCOSE, CAPILLARY - Abnormal; Notable for the following:    Glucose-Capillary 202 (*)    All other  components within normal limits  GLUCOSE, CAPILLARY - Abnormal; Notable for the following:    Glucose-Capillary 101 (*)    All other components within normal limits  GLUCOSE, CAPILLARY - Abnormal; Notable for the following:    Glucose-Capillary 159 (*)    All other components within normal limits  GLUCOSE, CAPILLARY - Abnormal; Notable for the following:    Glucose-Capillary 162 (*)    All other components within normal limits  CBC - Abnormal; Notable for  the following:    RBC 3.35 (*)    Hemoglobin 10.6 (*)    HCT 33.1 (*)    All other components within normal limits  GLUCOSE, CAPILLARY - Abnormal; Notable for the following:    Glucose-Capillary 205 (*)    All other components within normal limits  RENAL FUNCTION PANEL - Abnormal; Notable for the following:    Sodium 132 (*)    Chloride 97 (*)    BUN 52 (*)    Creatinine, Ser 6.31 (*)    Calcium 8.2 (*)    Phosphorus 5.8 (*)    Albumin 2.8 (*)    GFR calc non Af Amer 8 (*)    GFR calc Af Amer 9 (*)    All other components within normal limits  GLUCOSE, CAPILLARY - Abnormal; Notable for the following:    Glucose-Capillary 101 (*)    All other components within normal limits  CBC - Abnormal; Notable for the following:    WBC 11.4 (*)    RBC 3.49 (*)    Hemoglobin 11.1 (*)    HCT 35.0 (*)    MCV 100.3 (*)    All other components within normal limits  RENAL FUNCTION PANEL - Abnormal; Notable for the following:    Sodium 132 (*)    Chloride 98 (*)    Glucose, Bld 116 (*)    BUN 37 (*)    Creatinine, Ser 4.34 (*)    Calcium 8.2 (*)    Albumin 2.7 (*)    GFR calc non Af Amer 13 (*)    GFR calc Af Amer 15 (*)    All other components within normal limits  GLUCOSE, CAPILLARY - Abnormal; Notable for the following:    Glucose-Capillary 242 (*)    All other components within normal limits  GLUCOSE, CAPILLARY - Abnormal; Notable for the following:    Glucose-Capillary 240 (*)    All other components within normal limits    GLUCOSE, CAPILLARY - Abnormal; Notable for the following:    Glucose-Capillary 161 (*)    All other components within normal limits  I-STAT CHEM 8, ED - Abnormal; Notable for the following:    Sodium 133 (*)    Chloride 99 (*)    BUN 35 (*)    Creatinine, Ser 7.10 (*)    Glucose, Bld 181 (*)    Calcium, Ion 1.00 (*)    Hemoglobin 12.6 (*)    HCT 37.0 (*)    All other components within normal limits  MRSA PCR SCREENING  HEMOGLOBIN A1C  LACTIC ACID, PLASMA  LACTIC ACID, PLASMA  GLUCOSE, CAPILLARY  GLUCOSE, CAPILLARY  GLUCOSE, CAPILLARY  GLUCOSE, CAPILLARY    EKG  EKG Interpretation  Date/Time:  Friday February 28 2016 14:43:08 EST Ventricular Rate:  105 PR Interval:    QRS Duration: 76 QT Interval:  315 QTC Calculation: 417 R Axis:   -37 Text Interpretation:  Sinus tachycardia Low voltage, extremity and precordial leads Baseline wander in lead(s) II III aVR aVL aVF V2 Confirmed by Jeneen Rinks  MD, [place] ([id number]) on 02/28/2016 3:24:52 PM       Radiology No results found.  Procedures Procedures (including critical care time)  Medications Ordered in ED Medications  methylPREDNISolone sodium succinate (SOLU-MEDROL) 125 mg/2 mL injection 125 mg (125 mg Intravenous Given 02/28/16 1512)  albuterol (PROVENTIL) (2.5 MG/3ML) 0.083% nebulizer solution 5 mg (5 mg Nebulization Given 02/28/16 1632)  albuterol (PROVENTIL) (2.5 MG/3ML) 0.083% nebulizer solution 5 mg (5 mg Nebulization  Given 02/28/16 1806)  oseltamivir (TAMIFLU) capsule 30 mg (30 mg Oral Given 02/28/16 1856)  oseltamivir (TAMIFLU) capsule 30 mg (30 mg Oral Given 03/02/16 2234)     Initial Impression / Assessment and Plan / ED Course  I have reviewed the triage vital signs and the nursing notes.  Pertinent labs & imaging results that were available during my care of the patient were reviewed by me and considered in my medical decision making (see chart for details).     Chest x-ray without acute findings. Potassium  normal. I discussed admission with the patient. Does not appear to be in acute congestive heart failure. This may be an influenza-like illness. He wanted to try some additional time and additional nebulized albuterol. Found disposition will be per Dr. Alvino Chapel.  Final Clinical Impressions(s) / ED Diagnoses   Final diagnoses:  COPD exacerbation ([place])  Influenza  End stage renal disease on dialysis [facility])    New Prescriptions Discharge Medication List as of 03/05/2016  4:34 PM    START taking these medications   Details  Amino Acids-Protein Hydrolys (FEEDING SUPPLEMENT, PRO-STAT SUGAR FREE 64,) LIQD Take 30 mLs by mouth 2 (two) times daily., Starting Thu 03/05/2016, Until Sat 04/04/2016, Normal    Budesonide (PULMICORT FLEXHALER) 90 MCG/ACT inhaler Inhale 1 puff into the lungs 2 (two) times daily., Starting Thu 03/05/2016, Normal    tiotropium (SPIRIVA) 18 MCG inhalation capsule Place 1 capsule (18 mcg total) into inhaler and inhale daily., Starting Fri 03/06/2016, Normal         Tanna Furry, MD 03/12/16 [phone]

## 2016-03-13 DIAGNOSIS — E1129 Type 2 diabetes mellitus with other diabetic kidney complication: Secondary | ICD-10-CM | POA: Diagnosis not present

## 2016-03-13 DIAGNOSIS — D631 Anemia in chronic kidney disease: Secondary | ICD-10-CM | POA: Diagnosis not present

## 2016-03-13 DIAGNOSIS — N2581 Secondary hyperparathyroidism of renal origin: Secondary | ICD-10-CM | POA: Diagnosis not present

## 2016-03-13 DIAGNOSIS — Z23 Encounter for immunization: Secondary | ICD-10-CM | POA: Diagnosis not present

## 2016-03-13 DIAGNOSIS — N186 End stage renal disease: Secondary | ICD-10-CM | POA: Diagnosis not present

## 2016-03-13 DIAGNOSIS — D509 Iron deficiency anemia, unspecified: Secondary | ICD-10-CM | POA: Diagnosis not present

## 2016-03-16 DIAGNOSIS — Z23 Encounter for immunization: Secondary | ICD-10-CM | POA: Diagnosis not present

## 2016-03-16 DIAGNOSIS — N186 End stage renal disease: Secondary | ICD-10-CM | POA: Diagnosis not present

## 2016-03-16 DIAGNOSIS — E1129 Type 2 diabetes mellitus with other diabetic kidney complication: Secondary | ICD-10-CM | POA: Diagnosis not present

## 2016-03-16 DIAGNOSIS — D631 Anemia in chronic kidney disease: Secondary | ICD-10-CM | POA: Diagnosis not present

## 2016-03-16 DIAGNOSIS — D509 Iron deficiency anemia, unspecified: Secondary | ICD-10-CM | POA: Diagnosis not present

## 2016-03-16 DIAGNOSIS — N2581 Secondary hyperparathyroidism of renal origin: Secondary | ICD-10-CM | POA: Diagnosis not present

## 2016-03-18 DIAGNOSIS — D509 Iron deficiency anemia, unspecified: Secondary | ICD-10-CM | POA: Diagnosis not present

## 2016-03-18 DIAGNOSIS — N2581 Secondary hyperparathyroidism of renal origin: Secondary | ICD-10-CM | POA: Diagnosis not present

## 2016-03-18 DIAGNOSIS — E1129 Type 2 diabetes mellitus with other diabetic kidney complication: Secondary | ICD-10-CM | POA: Diagnosis not present

## 2016-03-18 DIAGNOSIS — D631 Anemia in chronic kidney disease: Secondary | ICD-10-CM | POA: Diagnosis not present

## 2016-03-18 DIAGNOSIS — N186 End stage renal disease: Secondary | ICD-10-CM | POA: Diagnosis not present

## 2016-03-18 DIAGNOSIS — Z23 Encounter for immunization: Secondary | ICD-10-CM | POA: Diagnosis not present

## 2016-03-20 DIAGNOSIS — E1129 Type 2 diabetes mellitus with other diabetic kidney complication: Secondary | ICD-10-CM | POA: Diagnosis not present

## 2016-03-20 DIAGNOSIS — D631 Anemia in chronic kidney disease: Secondary | ICD-10-CM | POA: Diagnosis not present

## 2016-03-20 DIAGNOSIS — Z23 Encounter for immunization: Secondary | ICD-10-CM | POA: Diagnosis not present

## 2016-03-20 DIAGNOSIS — N186 End stage renal disease: Secondary | ICD-10-CM | POA: Diagnosis not present

## 2016-03-20 DIAGNOSIS — N2581 Secondary hyperparathyroidism of renal origin: Secondary | ICD-10-CM | POA: Diagnosis not present

## 2016-03-20 DIAGNOSIS — D509 Iron deficiency anemia, unspecified: Secondary | ICD-10-CM | POA: Diagnosis not present

## 2016-03-23 DIAGNOSIS — D631 Anemia in chronic kidney disease: Secondary | ICD-10-CM | POA: Diagnosis not present

## 2016-03-23 DIAGNOSIS — N186 End stage renal disease: Secondary | ICD-10-CM | POA: Diagnosis not present

## 2016-03-23 DIAGNOSIS — D509 Iron deficiency anemia, unspecified: Secondary | ICD-10-CM | POA: Diagnosis not present

## 2016-03-23 DIAGNOSIS — E1129 Type 2 diabetes mellitus with other diabetic kidney complication: Secondary | ICD-10-CM | POA: Diagnosis not present

## 2016-03-23 DIAGNOSIS — Z23 Encounter for immunization: Secondary | ICD-10-CM | POA: Diagnosis not present

## 2016-03-23 DIAGNOSIS — N2581 Secondary hyperparathyroidism of renal origin: Secondary | ICD-10-CM | POA: Diagnosis not present

## 2016-03-25 ENCOUNTER — Ambulatory Visit (INDEPENDENT_AMBULATORY_CARE_PROVIDER_SITE_OTHER): Payer: Medicare Other | Admitting: Vascular Surgery

## 2016-03-25 ENCOUNTER — Other Ambulatory Visit: Payer: Self-pay

## 2016-03-25 ENCOUNTER — Encounter: Payer: Self-pay | Admitting: Vascular Surgery

## 2016-03-25 VITALS — BP 121/63 | HR 91 | Temp 98.1°F | Resp 18 | Ht 71.0 in | Wt 194.0 lb

## 2016-03-25 DIAGNOSIS — D631 Anemia in chronic kidney disease: Secondary | ICD-10-CM | POA: Diagnosis not present

## 2016-03-25 DIAGNOSIS — N186 End stage renal disease: Secondary | ICD-10-CM | POA: Diagnosis not present

## 2016-03-25 DIAGNOSIS — E1129 Type 2 diabetes mellitus with other diabetic kidney complication: Secondary | ICD-10-CM | POA: Diagnosis not present

## 2016-03-25 DIAGNOSIS — D509 Iron deficiency anemia, unspecified: Secondary | ICD-10-CM | POA: Diagnosis not present

## 2016-03-25 DIAGNOSIS — N2581 Secondary hyperparathyroidism of renal origin: Secondary | ICD-10-CM | POA: Diagnosis not present

## 2016-03-25 DIAGNOSIS — Z23 Encounter for immunization: Secondary | ICD-10-CM | POA: Diagnosis not present

## 2016-03-25 NOTE — Progress Notes (Signed)
Patient ID: [name], male   DOB: [birth year], 66 y.o.   MRN: [id number]  Reason for Consult: Routine Post Op   Referred by Center, [facility] Medic*  Subjective:     HPI:  [name] is a 66 y.o. male returns for follow-up visit from right sided radiocephalic AV fistula. He previously had a left-sided basilic vein transposition fistula that had occluded when he was not on dialysis. He now dialyzes Mondays Wednesdays Fridays via right IJ tunneled dialysis catheter. Following placement radiocephalic fistula he has been admitted with the flu. He also has chronic hypertension on dialysis. He states in the last 2 weeks he can longer feel the thrill in his fistula and this has given him concern. He does not have pain in his hand or pain or swelling in his right upper extremity. He does not take any blood thinners.  Past Medical History:  Diagnosis Date  . Cancer [facility])    prostate  . Chronic kidney disease    Dialysis M/w/F  . COPD (chronic obstructive pulmonary disease) ([place])   . Diabetes mellitus    Type 2   . GERD (gastroesophageal reflux disease)   . High cholesterol   . Hypertension   . Shortness of breath dyspnea    Family History  Problem Relation Age of Onset  . Heart failure Mother   . CAD Father   . Prostate cancer Neg Hx   . Kidney cancer Neg Hx    Past Surgical History:  Procedure Laterality Date  . AV FISTULA PLACEMENT Left 03/13/2015   Procedure: Left arm basilic vein transposition .;  Surgeon: Elam Dutch, MD;  Location: [place];  Service: Vascular;  Laterality: Left;  . AV FISTULA PLACEMENT Right 02/18/2016   Procedure: RADIOCEPHALIC ARTERIOVENOUS (AV) FISTULA CREATION;  Surgeon: Waynetta Sandy, MD;  Location: [place];  Service: Vascular;  Laterality: Right;  . COLON RESECTION    . KNEE SURGERY    . TOTAL HIP ARTHROPLASTY     bilat  . TOTAL SHOULDER ARTHROPLASTY     right    Short Social History:  Social History  Substance Use Topics  . Smoking  status: Current Some Day Smoker    Packs/day: 0.03    Types: Cigarettes  . Smokeless tobacco: Never Used  . Alcohol use No    Allergies  Allergen Reactions  . Pravastatin Other (See Comments)    Muscle pain  . Simvastatin Other (See Comments)    Muscle pain    Current Outpatient Prescriptions  Medication Sig Dispense Refill  . albuterol (PROVENTIL HFA;VENTOLIN HFA) 108 (90 Base) MCG/ACT inhaler Inhale 1-2 puffs into the lungs every 6 (six) hours as needed for wheezing. 1 Inhaler 0  . Amino Acids-Protein Hydrolys (FEEDING SUPPLEMENT, PRO-STAT SUGAR FREE 64,) LIQD Take 30 mLs by mouth 2 (two) times daily. 60 Bottle 0  . amLODipine (NORVASC) 10 MG tablet Take 10 mg by mouth daily.    Marland Kitchen atorvastatin (LIPITOR) 20 MG tablet Take 10 mg by mouth daily.    . Budesonide (PULMICORT FLEXHALER) 90 MCG/ACT inhaler Inhale 1 puff into the lungs 2 (two) times daily. 1 Inhaler 0  . furosemide (LASIX) 20 MG tablet Take 1 tablet (20 mg total) by mouth daily. 30 tablet 0  . glipiZIDE (GLUCOTROL) 10 MG tablet Take 1 tablet (10 mg total) by mouth daily before breakfast. 30 tablet 0  . oxyCODONE-acetaminophen (ROXICET) 5-325 MG tablet Take 1-2 tablets by mouth every 6 (six) hours  as needed for moderate pain or severe pain. 30 tablet 0  . tamsulosin (FLOMAX) 0.4 MG CAPS capsule Take 0.4 mg by mouth daily.    Marland Kitchen tiotropium (SPIRIVA) 18 MCG inhalation capsule Place 1 capsule (18 mcg total) into inhaler and inhale daily. 30 capsule 0   No current facility-administered medications for this visit.     Review of Systems  Constitutional:  Constitutional negative. Eyes: Positive for visual disturbance.   Respiratory: Respiratory negative.  Cardiovascular: Cardiovascular negative.  GI: Gastrointestinal negative.  Skin: Skin negative.  Neurological: Neurological negative. Hematologic: Hematologic/lymphatic negative.  Psychiatric: Psychiatric negative.        Objective:  Objective   Vitals:   03/25/16  0841  BP: 121/63  Pulse: 91  Resp: 18  Temp: 98.1 F (36.7 C)  SpO2: 97%  Weight: 194 lb (88 kg)  Height: 5\' 11"  (1.803 m)   Body mass index is 27.06 kg/m.  Physical Exam  Constitutional: He appears well-developed.  HENT:  Head: Normocephalic.  Neck: Normal range of motion.  Cardiovascular: Normal rate.   2+ palpable right brachial pulse  Pulmonary/Chest: Effort normal.  Abdominal: Soft.  Musculoskeletal: He exhibits edema.  Neurological: He is alert.  Skin: Skin is warm and dry.  Psychiatric: He has a normal mood and affect. His behavior is normal. Judgment and thought content normal.         Assessment/Plan:    66 year old male with end-stage renal disease on dialysis Monday Wednesday Friday via right internal jugular catheter. He recently had a right radiocephalic AV fistula that is occluded on physical exam. Vein mapping from last year demonstrated adequate upper arm vein that is apparent on physical exam as well. We'll schedule him on a Tuesday or Thursday for a right sided brachiocephalic AV fistula versus graft. If the fistula fails we can go back to the left arm for upper extremity grafting. He does not take blood thinners we'll get this scheduled as soon as possible.     Waynetta Sandy MD [facility]

## 2016-03-27 DIAGNOSIS — N186 End stage renal disease: Secondary | ICD-10-CM | POA: Diagnosis not present

## 2016-03-27 DIAGNOSIS — Z23 Encounter for immunization: Secondary | ICD-10-CM | POA: Diagnosis not present

## 2016-03-27 DIAGNOSIS — D509 Iron deficiency anemia, unspecified: Secondary | ICD-10-CM | POA: Diagnosis not present

## 2016-03-27 DIAGNOSIS — E1129 Type 2 diabetes mellitus with other diabetic kidney complication: Secondary | ICD-10-CM | POA: Diagnosis not present

## 2016-03-27 DIAGNOSIS — N2581 Secondary hyperparathyroidism of renal origin: Secondary | ICD-10-CM | POA: Diagnosis not present

## 2016-03-27 DIAGNOSIS — D631 Anemia in chronic kidney disease: Secondary | ICD-10-CM | POA: Diagnosis not present

## 2016-03-30 DIAGNOSIS — N2581 Secondary hyperparathyroidism of renal origin: Secondary | ICD-10-CM | POA: Diagnosis not present

## 2016-03-30 DIAGNOSIS — E1129 Type 2 diabetes mellitus with other diabetic kidney complication: Secondary | ICD-10-CM | POA: Diagnosis not present

## 2016-03-30 DIAGNOSIS — N186 End stage renal disease: Secondary | ICD-10-CM | POA: Diagnosis not present

## 2016-03-30 DIAGNOSIS — Z23 Encounter for immunization: Secondary | ICD-10-CM | POA: Diagnosis not present

## 2016-03-30 DIAGNOSIS — D509 Iron deficiency anemia, unspecified: Secondary | ICD-10-CM | POA: Diagnosis not present

## 2016-03-30 DIAGNOSIS — D631 Anemia in chronic kidney disease: Secondary | ICD-10-CM | POA: Diagnosis not present

## 2016-03-31 ENCOUNTER — Institutional Professional Consult (permissible substitution): Payer: Medicare Other | Admitting: Internal Medicine

## 2016-03-31 ENCOUNTER — Encounter (HOSPITAL_COMMUNITY): Payer: Self-pay | Admitting: *Deleted

## 2016-03-31 NOTE — Progress Notes (Signed)
Spoke with pt for pre-op call. Pt denies cardiac history. Pt is diabetic, type 2. Last A1C ws 5.5 on 02/28/16. States his fasting blood sugar is usually in the 120's.

## 2016-04-01 DIAGNOSIS — N2581 Secondary hyperparathyroidism of renal origin: Secondary | ICD-10-CM | POA: Diagnosis not present

## 2016-04-01 DIAGNOSIS — D631 Anemia in chronic kidney disease: Secondary | ICD-10-CM | POA: Diagnosis not present

## 2016-04-01 DIAGNOSIS — Z23 Encounter for immunization: Secondary | ICD-10-CM | POA: Diagnosis not present

## 2016-04-01 DIAGNOSIS — N186 End stage renal disease: Secondary | ICD-10-CM | POA: Diagnosis not present

## 2016-04-01 DIAGNOSIS — E1129 Type 2 diabetes mellitus with other diabetic kidney complication: Secondary | ICD-10-CM | POA: Diagnosis not present

## 2016-04-01 DIAGNOSIS — D509 Iron deficiency anemia, unspecified: Secondary | ICD-10-CM | POA: Diagnosis not present

## 2016-04-02 ENCOUNTER — Ambulatory Visit (HOSPITAL_COMMUNITY): Payer: Medicare Other | Admitting: Anesthesiology

## 2016-04-02 ENCOUNTER — Encounter (HOSPITAL_COMMUNITY): Payer: Self-pay | Admitting: *Deleted

## 2016-04-02 ENCOUNTER — Ambulatory Visit (HOSPITAL_COMMUNITY)
Admission: RE | Admit: 2016-04-02 | Discharge: 2016-04-02 | Disposition: A | Discharge status: Home / Self Care | Payer: Medicare Other | Source: Ambulatory Visit | Attending: Vascular Surgery | Admitting: Vascular Surgery

## 2016-04-02 ENCOUNTER — Other Ambulatory Visit: Payer: Self-pay | Admitting: *Deleted

## 2016-04-02 ENCOUNTER — Encounter (HOSPITAL_COMMUNITY)
Admission: RE | Disposition: A | Discharge status: Home / Self Care | Payer: Self-pay | Source: Ambulatory Visit | Attending: Vascular Surgery

## 2016-04-02 DIAGNOSIS — Z7984 Long term (current) use of oral hypoglycemic drugs: Secondary | ICD-10-CM | POA: Insufficient documentation

## 2016-04-02 DIAGNOSIS — I120 Hypertensive chronic kidney disease with stage 5 chronic kidney disease or end stage renal disease: Secondary | ICD-10-CM | POA: Diagnosis not present

## 2016-04-02 DIAGNOSIS — E7800 Pure hypercholesterolemia, unspecified: Secondary | ICD-10-CM | POA: Diagnosis not present

## 2016-04-02 DIAGNOSIS — N185 Chronic kidney disease, stage 5: Secondary | ICD-10-CM | POA: Diagnosis not present

## 2016-04-02 DIAGNOSIS — Z79899 Other long term (current) drug therapy: Secondary | ICD-10-CM | POA: Diagnosis not present

## 2016-04-02 DIAGNOSIS — J449 Chronic obstructive pulmonary disease, unspecified: Secondary | ICD-10-CM | POA: Diagnosis not present

## 2016-04-02 DIAGNOSIS — E1122 Type 2 diabetes mellitus with diabetic chronic kidney disease: Secondary | ICD-10-CM | POA: Diagnosis not present

## 2016-04-02 DIAGNOSIS — Z96611 Presence of right artificial shoulder joint: Secondary | ICD-10-CM | POA: Insufficient documentation

## 2016-04-02 DIAGNOSIS — Z888 Allergy status to other drugs, medicaments and biological substances status: Secondary | ICD-10-CM | POA: Diagnosis not present

## 2016-04-02 DIAGNOSIS — Z992 Dependence on renal dialysis: Secondary | ICD-10-CM | POA: Insufficient documentation

## 2016-04-02 DIAGNOSIS — E119 Type 2 diabetes mellitus without complications: Secondary | ICD-10-CM | POA: Diagnosis not present

## 2016-04-02 DIAGNOSIS — D649 Anemia, unspecified: Secondary | ICD-10-CM | POA: Diagnosis not present

## 2016-04-02 DIAGNOSIS — Z7951 Long term (current) use of inhaled steroids: Secondary | ICD-10-CM | POA: Diagnosis not present

## 2016-04-02 DIAGNOSIS — Z8546 Personal history of malignant neoplasm of prostate: Secondary | ICD-10-CM | POA: Insufficient documentation

## 2016-04-02 DIAGNOSIS — Z4931 Encounter for adequacy testing for hemodialysis: Secondary | ICD-10-CM

## 2016-04-02 DIAGNOSIS — N186 End stage renal disease: Secondary | ICD-10-CM | POA: Diagnosis not present

## 2016-04-02 DIAGNOSIS — F17210 Nicotine dependence, cigarettes, uncomplicated: Secondary | ICD-10-CM | POA: Insufficient documentation

## 2016-04-02 DIAGNOSIS — Z96643 Presence of artificial hip joint, bilateral: Secondary | ICD-10-CM | POA: Insufficient documentation

## 2016-04-02 HISTORY — PX: AV FISTULA PLACEMENT: SHX1204

## 2016-04-02 LAB — POCT I-STAT 4, (NA,K, GLUC, HGB,HCT)
GLUCOSE: 114 mg/dL — AB (ref 65–99)
HEMATOCRIT: 40 % (ref 39.0–52.0)
HEMOGLOBIN: 13.6 g/dL (ref 13.0–17.0)
POTASSIUM: 6.4 mmol/L — AB (ref 3.5–5.1)
SODIUM: 133 mmol/L — AB (ref 135–145)

## 2016-04-02 LAB — GLUCOSE, CAPILLARY: GLUCOSE-CAPILLARY: 97 mg/dL (ref 65–99)

## 2016-04-02 SURGERY — ARTERIOVENOUS (AV) FISTULA CREATION
Anesthesia: Monitor Anesthesia Care | Site: Arm Upper | Laterality: Right

## 2016-04-02 MED ORDER — OXYCODONE-ACETAMINOPHEN 5-325 MG PO TABS: 1.0000 | ORAL_TABLET | Freq: Four times a day (QID) | ORAL | 0 refills | Status: DC | PRN

## 2016-04-02 MED ORDER — DEXTROSE 5 % IV SOLN
INTRAVENOUS | Status: AC
  Filled 2016-04-02: qty 1.5

## 2016-04-02 MED ORDER — CHLORHEXIDINE GLUCONATE CLOTH 2 % EX PADS: 6.0000 | MEDICATED_PAD | Freq: Once | CUTANEOUS | Status: DC

## 2016-04-02 MED ORDER — FENTANYL CITRATE (PF) 100 MCG/2ML IJ SOLN
INTRAMUSCULAR | Status: DC | PRN
  Administered 2016-04-02: 50 ug via INTRAVENOUS
  Administered 2016-04-02 (×2): 25 ug via INTRAVENOUS

## 2016-04-02 MED ORDER — MIDAZOLAM HCL 2 MG/2ML IJ SOLN
INTRAMUSCULAR | Status: AC
  Filled 2016-04-02: qty 2

## 2016-04-02 MED ORDER — LIDOCAINE-EPINEPHRINE (PF) 1 %-1:200000 IJ SOLN
INTRAMUSCULAR | Status: AC
  Filled 2016-04-02: qty 30

## 2016-04-02 MED ORDER — FENTANYL CITRATE (PF) 100 MCG/2ML IJ SOLN
INTRAMUSCULAR | Status: AC
  Filled 2016-04-02: qty 4

## 2016-04-02 MED ORDER — CEFUROXIME SODIUM 1.5 G IJ SOLR
1.5000 g | INTRAMUSCULAR | Status: AC
  Administered 2016-04-02: 1.5 g via INTRAVENOUS

## 2016-04-02 MED ORDER — PHENYLEPHRINE 40 MCG/ML (10ML) SYRINGE FOR IV PUSH (FOR BLOOD PRESSURE SUPPORT)
PREFILLED_SYRINGE | INTRAVENOUS | Status: DC | PRN
  Administered 2016-04-02: 80 ug via INTRAVENOUS
  Administered 2016-04-02 (×3): 40 ug via INTRAVENOUS

## 2016-04-02 MED ORDER — SODIUM CHLORIDE 0.9 % IV SOLN
INTRAVENOUS | Status: DC
  Administered 2016-04-02: 12:00:00 via INTRAVENOUS

## 2016-04-02 MED ORDER — LIDOCAINE-EPINEPHRINE (PF) 1 %-1:200000 IJ SOLN
INTRAMUSCULAR | Status: DC | PRN
  Administered 2016-04-02: 30 mL

## 2016-04-02 MED ORDER — PROPOFOL 10 MG/ML IV BOLUS
INTRAVENOUS | Status: AC
  Filled 2016-04-02: qty 20

## 2016-04-02 MED ORDER — MIDAZOLAM HCL 5 MG/5ML IJ SOLN
INTRAMUSCULAR | Status: DC | PRN
  Administered 2016-04-02 (×2): 1 mg via INTRAVENOUS

## 2016-04-02 MED ORDER — PROPOFOL 500 MG/50ML IV EMUL
INTRAVENOUS | Status: DC | PRN
  Administered 2016-04-02: 50 ug/kg/min via INTRAVENOUS

## 2016-04-02 MED ORDER — 0.9 % SODIUM CHLORIDE (POUR BTL) OPTIME
TOPICAL | Status: DC | PRN
  Administered 2016-04-02: 1000 mL

## 2016-04-02 MED ORDER — SODIUM CHLORIDE 0.9 % IV SOLN
INTRAVENOUS | Status: DC | PRN
  Administered 2016-04-02: 12:00:00 500 mL

## 2016-04-02 SURGICAL SUPPLY — 34 items
ARMBAND PINK RESTRICT EXTREMIT (MISCELLANEOUS) ×3 IMPLANT
CANISTER SUCTION 2500CC (MISCELLANEOUS) ×3 IMPLANT
CLIP TI MEDIUM 6 (CLIP) ×3 IMPLANT
CLIP TI WIDE RED SMALL 6 (CLIP) ×3 IMPLANT
COVER PROBE W GEL 5X96 (DRAPES) IMPLANT
DERMABOND ADVANCED (GAUZE/BANDAGES/DRESSINGS) ×2
DERMABOND ADVANCED .7 DNX12 (GAUZE/BANDAGES/DRESSINGS) ×1 IMPLANT
ELECT REM PT RETURN 9FT ADLT (ELECTROSURGICAL) ×3
ELECTRODE REM PT RTRN 9FT ADLT (ELECTROSURGICAL) ×1 IMPLANT
GLOVE BIO SURGEON STRL SZ 6.5 (GLOVE) ×2 IMPLANT
GLOVE BIO SURGEON STRL SZ7.5 (GLOVE) ×3 IMPLANT
GLOVE BIO SURGEON STRL SZ8.5 (GLOVE) ×3 IMPLANT
GLOVE BIO SURGEONS STRL SZ 6.5 (GLOVE) ×1
GLOVE BIOGEL PI IND STRL 6.5 (GLOVE) ×1 IMPLANT
GLOVE BIOGEL PI IND STRL 7.0 (GLOVE) ×3 IMPLANT
GLOVE BIOGEL PI INDICATOR 6.5 (GLOVE) ×2
GLOVE BIOGEL PI INDICATOR 7.0 (GLOVE) ×6
GLOVE ECLIPSE 6.5 STRL STRAW (GLOVE) ×3 IMPLANT
GLOVE SURG SS PI 6.5 STRL IVOR (GLOVE) ×3 IMPLANT
GOWN STRL REUS W/ TWL LRG LVL3 (GOWN DISPOSABLE) ×4 IMPLANT
GOWN STRL REUS W/ TWL XL LVL3 (GOWN DISPOSABLE) ×2 IMPLANT
GOWN STRL REUS W/TWL LRG LVL3 (GOWN DISPOSABLE) ×8
GOWN STRL REUS W/TWL XL LVL3 (GOWN DISPOSABLE) ×4
KIT BASIN OR (CUSTOM PROCEDURE TRAY) ×3 IMPLANT
KIT ROOM TURNOVER OR (KITS) ×3 IMPLANT
NS IRRIG 1000ML POUR BTL (IV SOLUTION) ×3 IMPLANT
PACK CV ACCESS (CUSTOM PROCEDURE TRAY) ×3 IMPLANT
PAD ARMBOARD 7.5X6 YLW CONV (MISCELLANEOUS) ×6 IMPLANT
SUT MNCRL AB 4-0 PS2 18 (SUTURE) ×3 IMPLANT
SUT PROLENE 6 0 BV (SUTURE) ×3 IMPLANT
SUT VIC AB 3-0 SH 27 (SUTURE) ×2
SUT VIC AB 3-0 SH 27X BRD (SUTURE) ×1 IMPLANT
UNDERPAD 30X30 (UNDERPADS AND DIAPERS) ×3 IMPLANT
WATER STERILE IRR 1000ML POUR (IV SOLUTION) ×3 IMPLANT

## 2016-04-02 NOTE — Op Note (Signed)
    Patient name: [name] MRN: [id number] DOB: [birth year] Sex: male  04/02/2016 Pre-operative Diagnosis: esrd Post-operative diagnosis:  Same Surgeon:  Erlene Quan C. Donzetta Matters, MD Assistant: Silva Bandy, Pa Procedure Performed: right arm brachiocephalic av fistula  Indications:  66 year old male end-stage renal disease has multiple failed left upper extremity AV fistulas and a failed right radiocephalic fistula. He is now indicated for above procedure.  Findings: Suitable cephalic and basilic vein in the antecubital fossa. The cephalic was harvested maintaining in-line flow to the via median cubital branch to the basilic vein. At completion there was palpable pulsatility in runoff vein and radial and ulnar arterial signals at the wrist.   Procedure:  The patient was identified in the holding area and taken to the operating room where he was placed supine on the operating table Mac anesthesia was induced sterilely prepped and draped in the right arm usual fashion for access procedure given antibiotics and timeout called. Following this ultrasound was used to identify the cephalic vein and brachial artery. An incision was traced and 1% lidocaine with epinephrine was instilled a total of 10 mL. Then made a transverse incision dissected down through the skin and subcutaneous tissue identified the cephalic vein and median cubital veins. Cephalic vein was traced out branches ligated between ties. We then dissected deeper dividing the brachial radialis fascia identifying our brachial artery and placed a vessel loop around it. The cephalic vein was then transected and stump was tied off leaving in line flow to the basilic vein. The vein dilated up to 3 mm easily and was flushed with heparinized saline. The artery was then clamped distally and proximally, opened longitudinally and then flushed both ways with heparinized saline. The vein was then spatulated and sewn end-to-side with 6-0 Prolene suture. Prior to  releasing clamps we allowed flushing maneuvers. Upon releasing the clamps we did have a palpable pulsatility in the runoff vein and multiphasic signals in the ulnar and radial arteries at the level of the wrist. Satisfied we obtained hemostasis in our wound closed in 2 layers with Vicryl Monocryl and Dermabond was placed above that. Patient is laterally from anesthesia having tolerated the procedure well without immediate complication.    Morry Veiga C. Donzetta Matters, MD [facility] Office: [phone] Pager: [phone]

## 2016-04-02 NOTE — Anesthesia Preprocedure Evaluation (Addendum)
Anesthesia Evaluation  Patient identified by MRN, date of birth, ID band Patient awake    Reviewed: Allergy & Precautions, NPO status , Patient's Chart, lab work & pertinent test results  Airway Mallampati: II  TM Distance: >3 FB Neck ROM: Full    Dental  (+) Upper Dentures   Pulmonary shortness of breath, COPD, Current Smoker,    breath sounds clear to auscultation       Cardiovascular hypertension,  Rhythm:Regular Rate:Normal     Neuro/Psych    GI/Hepatic GERD  ,  Endo/Other  diabetes  Renal/GU ESRF and DialysisRenal disease     Musculoskeletal   Abdominal   Peds  Hematology   Anesthesia Other Findings   Reproductive/Obstetrics                            Anesthesia Physical Anesthesia Plan  ASA: III  Anesthesia Plan: MAC   Post-op Pain Management:    Induction: Intravenous  Airway Management Planned: Simple Face Mask and Natural Airway  Additional Equipment:   Intra-op Plan:   Post-operative Plan:   Informed Consent: I have reviewed the patients History and Physical, chart, labs and discussed the procedure including the risks, benefits and alternatives for the proposed anesthesia with the patient or authorized representative who has indicated his/her understanding and acceptance.     Plan Discussed with: CRNA  Anesthesia Plan Comments:        Anesthesia Quick Evaluation

## 2016-04-02 NOTE — Anesthesia Postprocedure Evaluation (Addendum)
Anesthesia Post Note  Patient: [name]  Procedure(s) Performed: Procedure(s) (LRB): BRACHIOCEPHALIC ARTERIOVENOUS (AV) FISTULA CREATION (Right)  Patient location during evaluation: PACU Anesthesia Type: MAC Level of consciousness: awake and alert Pain management: pain level controlled Vital Signs Assessment: post-procedure vital signs reviewed and stable Respiratory status: spontaneous breathing, nonlabored ventilation, respiratory function stable and patient connected to nasal cannula oxygen Cardiovascular status: stable and blood pressure returned to baseline Anesthetic complications: no       Last Vitals:  Vitals:   04/02/16 1305 04/02/16 1322  BP: 116/70 120/74  Pulse: 86 79  Resp: 16 18  Temp: 36.1 C 36.1 C    Last Pain:  Vitals:   04/02/16 1322  TempSrc:   PainSc: 0-No pain                 Amal Saiki,JAMES TERRILL     

## 2016-04-02 NOTE — H&P (Signed)
    History and Physical Update  The patient was interviewed and re-examined.  The patient's previous History and Physical has been reviewed and is unchanged from office visit. Plan for right arm brachiocephalic avf vs graft.   Brandon C. Donzetta Matters, MD [facility] Office: [phone] Pager: [phone]   04/02/2016, 9:55 AM

## 2016-04-02 NOTE — Transfer of Care (Signed)
Immediate Anesthesia Transfer of Care Note  Patient: [name]  Procedure(s) Performed: Procedure(s): BRACHIOCEPHALIC ARTERIOVENOUS (AV) FISTULA CREATION (Right)  Patient Location: PACU  Anesthesia Type:MAC  Level of Consciousness: awake, alert , oriented and sedated  Airway & Oxygen Therapy: Patient Spontanous Breathing  Post-op Assessment: Report given to RN, Post -op Vital signs reviewed and stable and Patient moving all extremities  Post vital signs: Reviewed and stable  Last Vitals:  Vitals:   04/02/16 0846  BP: 110/69  Pulse: (!) 102  Resp: 18  Temp: 37.1 C    Last Pain:  Vitals:   04/02/16 0846  TempSrc: Oral      Patients Stated Pain Goal: 7 (27/25/36 6440)  Complications: No apparent anesthesia complications

## 2016-04-02 NOTE — Anesthesia Postprocedure Evaluation (Signed)
Anesthesia Post Note  Patient: ANWAR SAKATA  Procedure(s) Performed: Procedure(s) (LRB): BRACHIOCEPHALIC ARTERIOVENOUS (AV) FISTULA CREATION (Right)  Patient location during evaluation: PACU Anesthesia Type: MAC Level of consciousness: awake and alert Pain management: pain level controlled Vital Signs Assessment: post-procedure vital signs reviewed and stable Respiratory status: spontaneous breathing, nonlabored ventilation, respiratory function stable and patient connected to nasal cannula oxygen Cardiovascular status: stable and blood pressure returned to baseline Anesthetic complications: no       Last Vitals:  Vitals:   04/02/16 1305 04/02/16 1322  BP: 116/70 120/74  Pulse: 86 79  Resp: 16 18  Temp: 36.1 C 36.1 C    Last Pain:  Vitals:   04/02/16 1322  TempSrc:   PainSc: 0-No pain                 Halbert Jesson,JAMES TERRILL

## 2016-04-03 ENCOUNTER — Encounter: Payer: Medicare Other | Admitting: Vascular Surgery

## 2016-04-03 ENCOUNTER — Encounter (HOSPITAL_COMMUNITY): Payer: Medicare Other

## 2016-04-03 ENCOUNTER — Telehealth: Payer: Self-pay | Admitting: Vascular Surgery

## 2016-04-03 ENCOUNTER — Encounter (HOSPITAL_COMMUNITY): Payer: Self-pay | Admitting: Vascular Surgery

## 2016-04-03 DIAGNOSIS — N186 End stage renal disease: Secondary | ICD-10-CM | POA: Diagnosis not present

## 2016-04-03 DIAGNOSIS — Z23 Encounter for immunization: Secondary | ICD-10-CM | POA: Diagnosis not present

## 2016-04-03 DIAGNOSIS — D509 Iron deficiency anemia, unspecified: Secondary | ICD-10-CM | POA: Diagnosis not present

## 2016-04-03 DIAGNOSIS — N2581 Secondary hyperparathyroidism of renal origin: Secondary | ICD-10-CM | POA: Diagnosis not present

## 2016-04-03 DIAGNOSIS — E1129 Type 2 diabetes mellitus with other diabetic kidney complication: Secondary | ICD-10-CM | POA: Diagnosis not present

## 2016-04-03 DIAGNOSIS — D631 Anemia in chronic kidney disease: Secondary | ICD-10-CM | POA: Diagnosis not present

## 2016-04-03 LAB — POCT I-STAT 4, (NA,K, GLUC, HGB,HCT)
GLUCOSE: 99 mg/dL (ref 65–99)
HEMATOCRIT: 36 % — AB (ref 39.0–52.0)
HEMOGLOBIN: 12.2 g/dL — AB (ref 13.0–17.0)
POTASSIUM: 5.3 mmol/L — AB (ref 3.5–5.1)
Sodium: 134 mmol/L — ABNORMAL LOW (ref 135–145)

## 2016-04-03 NOTE — Telephone Encounter (Signed)
-----   Message from Mena Goes, RN sent at 04/02/2016  3:30 PM EST ----- Regarding: may be duplicate   ----- Message ----- From: Alvia Grove, PA-C Sent: 04/02/2016  12:59 PM To: [facility] Charge Pool  S/p right brachial cephalic AV fisutla [id number]  F/u ijn 4-6 weeks with PA and duplex  Thanks Maudie Mercury

## 2016-04-03 NOTE — Telephone Encounter (Signed)
Scheduled 3/22 @ 1 pm. Mailed reminder letter.

## 2016-04-06 DIAGNOSIS — E1129 Type 2 diabetes mellitus with other diabetic kidney complication: Secondary | ICD-10-CM | POA: Diagnosis not present

## 2016-04-06 DIAGNOSIS — N2581 Secondary hyperparathyroidism of renal origin: Secondary | ICD-10-CM | POA: Diagnosis not present

## 2016-04-06 DIAGNOSIS — D631 Anemia in chronic kidney disease: Secondary | ICD-10-CM | POA: Diagnosis not present

## 2016-04-06 DIAGNOSIS — Z23 Encounter for immunization: Secondary | ICD-10-CM | POA: Diagnosis not present

## 2016-04-06 DIAGNOSIS — D509 Iron deficiency anemia, unspecified: Secondary | ICD-10-CM | POA: Diagnosis not present

## 2016-04-06 DIAGNOSIS — N186 End stage renal disease: Secondary | ICD-10-CM | POA: Diagnosis not present

## 2016-04-07 NOTE — Addendum Note (Signed)
Addended by: Lianne Cure A on: 04/07/2016 04:51 PM   Modules accepted: Orders

## 2016-04-08 DIAGNOSIS — D509 Iron deficiency anemia, unspecified: Secondary | ICD-10-CM | POA: Diagnosis not present

## 2016-04-08 DIAGNOSIS — E1129 Type 2 diabetes mellitus with other diabetic kidney complication: Secondary | ICD-10-CM | POA: Diagnosis not present

## 2016-04-08 DIAGNOSIS — N2581 Secondary hyperparathyroidism of renal origin: Secondary | ICD-10-CM | POA: Diagnosis not present

## 2016-04-08 DIAGNOSIS — Z23 Encounter for immunization: Secondary | ICD-10-CM | POA: Diagnosis not present

## 2016-04-08 DIAGNOSIS — Z992 Dependence on renal dialysis: Secondary | ICD-10-CM | POA: Diagnosis not present

## 2016-04-08 DIAGNOSIS — N186 End stage renal disease: Secondary | ICD-10-CM | POA: Diagnosis not present

## 2016-04-08 DIAGNOSIS — E1122 Type 2 diabetes mellitus with diabetic chronic kidney disease: Secondary | ICD-10-CM | POA: Diagnosis not present

## 2016-04-08 DIAGNOSIS — D631 Anemia in chronic kidney disease: Secondary | ICD-10-CM | POA: Diagnosis not present

## 2016-04-10 DIAGNOSIS — N186 End stage renal disease: Secondary | ICD-10-CM | POA: Diagnosis not present

## 2016-04-10 DIAGNOSIS — E1129 Type 2 diabetes mellitus with other diabetic kidney complication: Secondary | ICD-10-CM | POA: Diagnosis not present

## 2016-04-10 DIAGNOSIS — N2581 Secondary hyperparathyroidism of renal origin: Secondary | ICD-10-CM | POA: Diagnosis not present

## 2016-04-10 DIAGNOSIS — D631 Anemia in chronic kidney disease: Secondary | ICD-10-CM | POA: Diagnosis not present

## 2016-04-10 DIAGNOSIS — D509 Iron deficiency anemia, unspecified: Secondary | ICD-10-CM | POA: Diagnosis not present

## 2016-04-13 DIAGNOSIS — E1129 Type 2 diabetes mellitus with other diabetic kidney complication: Secondary | ICD-10-CM | POA: Diagnosis not present

## 2016-04-13 DIAGNOSIS — N186 End stage renal disease: Secondary | ICD-10-CM | POA: Diagnosis not present

## 2016-04-13 DIAGNOSIS — D509 Iron deficiency anemia, unspecified: Secondary | ICD-10-CM | POA: Diagnosis not present

## 2016-04-13 DIAGNOSIS — N2581 Secondary hyperparathyroidism of renal origin: Secondary | ICD-10-CM | POA: Diagnosis not present

## 2016-04-13 DIAGNOSIS — D631 Anemia in chronic kidney disease: Secondary | ICD-10-CM | POA: Diagnosis not present

## 2016-04-15 DIAGNOSIS — N186 End stage renal disease: Secondary | ICD-10-CM | POA: Diagnosis not present

## 2016-04-15 DIAGNOSIS — E1129 Type 2 diabetes mellitus with other diabetic kidney complication: Secondary | ICD-10-CM | POA: Diagnosis not present

## 2016-04-15 DIAGNOSIS — D509 Iron deficiency anemia, unspecified: Secondary | ICD-10-CM | POA: Diagnosis not present

## 2016-04-15 DIAGNOSIS — D631 Anemia in chronic kidney disease: Secondary | ICD-10-CM | POA: Diagnosis not present

## 2016-04-15 DIAGNOSIS — N2581 Secondary hyperparathyroidism of renal origin: Secondary | ICD-10-CM | POA: Diagnosis not present

## 2016-04-17 ENCOUNTER — Encounter: Payer: Self-pay | Admitting: Vascular Surgery

## 2016-04-17 DIAGNOSIS — D509 Iron deficiency anemia, unspecified: Secondary | ICD-10-CM | POA: Diagnosis not present

## 2016-04-17 DIAGNOSIS — E1129 Type 2 diabetes mellitus with other diabetic kidney complication: Secondary | ICD-10-CM | POA: Diagnosis not present

## 2016-04-17 DIAGNOSIS — D631 Anemia in chronic kidney disease: Secondary | ICD-10-CM | POA: Diagnosis not present

## 2016-04-17 DIAGNOSIS — N186 End stage renal disease: Secondary | ICD-10-CM | POA: Diagnosis not present

## 2016-04-17 DIAGNOSIS — N2581 Secondary hyperparathyroidism of renal origin: Secondary | ICD-10-CM | POA: Diagnosis not present

## 2016-04-20 DIAGNOSIS — N186 End stage renal disease: Secondary | ICD-10-CM | POA: Diagnosis not present

## 2016-04-20 DIAGNOSIS — E1129 Type 2 diabetes mellitus with other diabetic kidney complication: Secondary | ICD-10-CM | POA: Diagnosis not present

## 2016-04-20 DIAGNOSIS — D631 Anemia in chronic kidney disease: Secondary | ICD-10-CM | POA: Diagnosis not present

## 2016-04-20 DIAGNOSIS — D509 Iron deficiency anemia, unspecified: Secondary | ICD-10-CM | POA: Diagnosis not present

## 2016-04-20 DIAGNOSIS — N2581 Secondary hyperparathyroidism of renal origin: Secondary | ICD-10-CM | POA: Diagnosis not present

## 2016-04-21 ENCOUNTER — Ambulatory Visit (INDEPENDENT_AMBULATORY_CARE_PROVIDER_SITE_OTHER): Payer: Medicare Other | Admitting: Internal Medicine

## 2016-04-21 ENCOUNTER — Encounter: Payer: Self-pay | Admitting: Internal Medicine

## 2016-04-21 VITALS — BP 118/60 | HR 87 | Ht 71.0 in | Wt 203.8 lb

## 2016-04-21 DIAGNOSIS — J449 Chronic obstructive pulmonary disease, unspecified: Secondary | ICD-10-CM | POA: Diagnosis not present

## 2016-04-21 DIAGNOSIS — F17210 Nicotine dependence, cigarettes, uncomplicated: Secondary | ICD-10-CM | POA: Diagnosis not present

## 2016-04-21 MED ORDER — FLUTICASONE-UMECLIDIN-VILANT 100-62.5-25 MCG/INH IN AEPB: 1.0000 | INHALATION_SPRAY | Freq: Every day | RESPIRATORY_TRACT | 2 refills | Status: DC

## 2016-04-21 NOTE — Assessment & Plan Note (Addendum)
Spirometry 04/21/2016  FEV1 0.91 (29%)  Ratio 51   - 04/21/2016  After extensive coaching device effectiveness =    100% with elipta dpi > try trelegy   Group D in terms of symptom/risk and laba/lama/ICS  therefore appropriate rx at this point. He is more severe than he aprreciates and clearly has very poor airflow as well as mucociliary clearance related to years of smoking (see separate a/p)   For now best option is to consolidate rx with trelegy/ work on d/c cigs and f/u in 6 weeks to regroup.   Total time devoted to counseling  > 50 % of initial 60 min office visit:  review case with pt/ discussion of options/alternatives/ personally creating written customized instructions  in presence of pt  then going over those specific  Instructions directly with the pt including how to use all of the meds but in particular covering each new medication in detail and the difference between the maintenance= "automatic" meds and the prns using an action plan format for the latter (If this problem/symptom => do that organization reading Left to right).  Please see AVS from this visit for a full list of these instructions which I personally wrote for this pt and  are unique to this visit.

## 2016-04-21 NOTE — Assessment & Plan Note (Signed)

## 2016-04-21 NOTE — Patient Instructions (Signed)
trelegy one click each am   The key is to stop smoking completely before smoking completely stops you!   Please schedule a follow up office visit in 6 weeks, call sooner if needed with pfts

## 2016-04-21 NOTE — Progress Notes (Signed)
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/p admit:    Date of Admission: 02/28/2016                      Date of Discharge: 03/05/2016 Admitting Physician: Zenia Resides, MD  Primary Care Provider: [facility] Consultants: Nephrology  Indication for Hospitalization: Dyspnea with hypoxia  Discharge Diagnoses/Problem List:  Dyspnea with hypoxemia Influenza positive HFpEF ESRD Non-insulin-dependent diabetes mellitus Hypertension Urinary retention  Disposition: Home  Discharge Condition: Stable, improved  Discharge Exam:  General: elderly male laying in bed receiving dialysis, no acute distress with non-toxic appearance HEENT: normocephalic, atraumatic, moist mucous membranes Neck: supple, non-tender without lymphadenopathy CV: regular rate and rhythm without murmurs, rubs, or gallops, non-edematous Lungs: transmitted upper airway sounds bilaterally with normal work of breathing on 2L [place], decreased movement of air throughout lung fields bilaterally Abdomen: soft, non-tender, normoactive bowel sounds Skin: warm, dry, no rashes or lesions, cap refill < 2 seconds Extremities: warm and well perfused, normal tone  Brief Hospital Course:  [name] a 65 y.o.male with a past medical history significant for ESRD, HTN, DM, COPD, tobacco use presenting with cough, shortness of breath and increased sputum production found to have + flu also concern for COPD exacerbation.   Patient presented with cough, shortness of breath, and sputum production after missing HD session. Patient endorsed URI symptoms earlier in the week. His shortness of breath prompted him to come [facility] the [facility] ED. Patient denied chest pain.  Upon arrival to ED, patient without signs of respiratory failure. Oxygen levels in the low 90s on room air. Patient remained afebrile and stable.  Pertinent labs include no leukocytosis but positive lactic acid 2.1 likely secondary to ESRD. Patient is noted flu positive and started on Tamiflu. Picture consistent with COPD exacerbation. Patient treated on prednisone and Levaquin for 5 days. Oxygen was weaned over the course of the hospitalization however patient required oxygen upon discharge. Patient was treated with HD 3 during hospitalization. Patient was safe for discharge with home O2.  Issues for Follow Up:  1. Patients hypoxia improved with weaning O2, however patient did require home oxygen upon discharge. 2. Patient completed Tamiflu course during hospitalization given positive influenza. 3. Patient treated for COPD exacerbation with prednisone 40 mg QD, Levaquin 500 mg q48h, with renally dosed. Patient discharged on Spiriva and Pulmicort for controller medication. 4. Patient would benefit from sleep study outpatient given likelihood of OSA. 5. Continue receiving HD M/W/F. Patient continued on home Lasix 20 mg QD for diuresis and pro-stat 30 mL BID per nutrition. 6. Physical therapy cleared patient without needs for follow-up outpatient. 7. Patient instructed to restart home glipizide upon discharge.     04/21/2016 1st [facility] office visit/ Wert  maint on spiriva/pulmocort flexhaler / not using 02  Chief Complaint  Patient presents with  . Pulmonary Consult    pt referred by Dr. Donzetta Matters for COPD, no problems with breathing at this time, SOB with exertion   back to baseline = MMRC3 = can't walk 100 yards even at a slow pace at a flat grade s stopping due to sob  / hips tired  Rattles all the time since d/c but minimal am mucus and able to lie flat all night ok  Breathing no different before vs after HD including Sunday  evening - HD is MWF   No obvious day to day or daytime variability or assoc production of  excess/ purulent sputum or mucus plugs or hemoptysis or cp or chest tightness, subjective wheeze or overt sinus or hb  symptoms. No unusual exp hx or h/o childhood pna/ asthma or knowledge of premature birth.  Sleeping ok without nocturnal  or early am exacerbation  of respiratory  c/o's or need for noct saba. Also denies any obvious fluctuation of symptoms with weather or environmental changes or other aggravating or alleviating factors except as outlined above   Current Medications, Allergies, Complete Past Medical History, Past Surgical History, Family History, and Social History were reviewed in [employer] record.      Review of Systems  Constitutional: Negative for fever and unexpected weight change.  HENT: Positive for congestion. Negative for dental problem, ear pain, nosebleeds, postnasal drip, rhinorrhea, sinus pressure, sneezing, sore throat and trouble swallowing.   Eyes: Negative for redness and itching.  Respiratory: Positive for shortness of breath. Negative for cough, chest tightness and wheezing.   Cardiovascular: Negative for palpitations and leg swelling.  Gastrointestinal: Negative for nausea and vomiting.  Genitourinary: Negative for dysuria.  Musculoskeletal: Negative for joint swelling.  Skin: Negative for rash.  Neurological: Negative for headaches.  Hematological: Does not bruise/bleed easily.  Psychiatric/Behavioral: Negative for dysphoric mood. The patient is not nervous/anxious.        Objective:   Physical Exam   amb jovial black male with congested rattling cough   Wt Readings from Last 3 Encounters:  04/21/16 203 lb 12.8 oz (92.4 kg)  04/02/16 194 lb (88 kg)  03/25/16 194 lb (88 kg)    Vital signs reviewed  - Note on arrival 02 sats  97% on RA      HEENT: nl   turbinates bilaterally, and oropharynx. Nl external ear canals without cough reflex - upper dentures   NECK :  without JVD/Nodes/TM/ nl carotid upstrokes bilaterally   LUNGS: no acc muscle use,  slt barrel contour with distant bs/ minimal late exp wheezing bilaterally    CV:   RRR  no s3 or murmur or increase in P2, and no edema   ABD:  soft and nontender with nl inspiratory excursion in the supine position. No bruits or organomegaly appreciated, bowel sounds nl  MS:  Nl gait/ ext warm without deformities, calf tenderness, cyanosis or clubbing No obvious joint restrictions   SKIN: warm and dry without lesions  / R SCV HD catheter in place, wound clean and dry   NEURO:  alert, approp, nl sensorium with  no motor or cerebellar deficits apparent.     I personally reviewed images and agree with radiology impression as follows:  CXR:   02/28/16 No active cardiopulmonary disease.       Assessment & Plan:

## 2016-04-22 DIAGNOSIS — E1129 Type 2 diabetes mellitus with other diabetic kidney complication: Secondary | ICD-10-CM | POA: Diagnosis not present

## 2016-04-22 DIAGNOSIS — N2581 Secondary hyperparathyroidism of renal origin: Secondary | ICD-10-CM | POA: Diagnosis not present

## 2016-04-22 DIAGNOSIS — D509 Iron deficiency anemia, unspecified: Secondary | ICD-10-CM | POA: Diagnosis not present

## 2016-04-22 DIAGNOSIS — N186 End stage renal disease: Secondary | ICD-10-CM | POA: Diagnosis not present

## 2016-04-22 DIAGNOSIS — D631 Anemia in chronic kidney disease: Secondary | ICD-10-CM | POA: Diagnosis not present

## 2016-04-24 DIAGNOSIS — N2581 Secondary hyperparathyroidism of renal origin: Secondary | ICD-10-CM | POA: Diagnosis not present

## 2016-04-24 DIAGNOSIS — E1129 Type 2 diabetes mellitus with other diabetic kidney complication: Secondary | ICD-10-CM | POA: Diagnosis not present

## 2016-04-24 DIAGNOSIS — D509 Iron deficiency anemia, unspecified: Secondary | ICD-10-CM | POA: Diagnosis not present

## 2016-04-24 DIAGNOSIS — D631 Anemia in chronic kidney disease: Secondary | ICD-10-CM | POA: Diagnosis not present

## 2016-04-24 DIAGNOSIS — N186 End stage renal disease: Secondary | ICD-10-CM | POA: Diagnosis not present

## 2016-04-27 DIAGNOSIS — E1129 Type 2 diabetes mellitus with other diabetic kidney complication: Secondary | ICD-10-CM | POA: Diagnosis not present

## 2016-04-27 DIAGNOSIS — N186 End stage renal disease: Secondary | ICD-10-CM | POA: Diagnosis not present

## 2016-04-27 DIAGNOSIS — N2581 Secondary hyperparathyroidism of renal origin: Secondary | ICD-10-CM | POA: Diagnosis not present

## 2016-04-27 DIAGNOSIS — D509 Iron deficiency anemia, unspecified: Secondary | ICD-10-CM | POA: Diagnosis not present

## 2016-04-27 DIAGNOSIS — D631 Anemia in chronic kidney disease: Secondary | ICD-10-CM | POA: Diagnosis not present

## 2016-04-29 DIAGNOSIS — D631 Anemia in chronic kidney disease: Secondary | ICD-10-CM | POA: Diagnosis not present

## 2016-04-29 DIAGNOSIS — E1129 Type 2 diabetes mellitus with other diabetic kidney complication: Secondary | ICD-10-CM | POA: Diagnosis not present

## 2016-04-29 DIAGNOSIS — N186 End stage renal disease: Secondary | ICD-10-CM | POA: Diagnosis not present

## 2016-04-29 DIAGNOSIS — N2581 Secondary hyperparathyroidism of renal origin: Secondary | ICD-10-CM | POA: Diagnosis not present

## 2016-04-29 DIAGNOSIS — D509 Iron deficiency anemia, unspecified: Secondary | ICD-10-CM | POA: Diagnosis not present

## 2016-04-30 ENCOUNTER — Ambulatory Visit (HOSPITAL_COMMUNITY): Payer: Medicare Other | Attending: Vascular Surgery

## 2016-05-01 DIAGNOSIS — N2581 Secondary hyperparathyroidism of renal origin: Secondary | ICD-10-CM | POA: Diagnosis not present

## 2016-05-01 DIAGNOSIS — D509 Iron deficiency anemia, unspecified: Secondary | ICD-10-CM | POA: Diagnosis not present

## 2016-05-01 DIAGNOSIS — D631 Anemia in chronic kidney disease: Secondary | ICD-10-CM | POA: Diagnosis not present

## 2016-05-01 DIAGNOSIS — N186 End stage renal disease: Secondary | ICD-10-CM | POA: Diagnosis not present

## 2016-05-01 DIAGNOSIS — E1129 Type 2 diabetes mellitus with other diabetic kidney complication: Secondary | ICD-10-CM | POA: Diagnosis not present

## 2016-05-04 DIAGNOSIS — N186 End stage renal disease: Secondary | ICD-10-CM | POA: Diagnosis not present

## 2016-05-04 DIAGNOSIS — D509 Iron deficiency anemia, unspecified: Secondary | ICD-10-CM | POA: Diagnosis not present

## 2016-05-04 DIAGNOSIS — E1129 Type 2 diabetes mellitus with other diabetic kidney complication: Secondary | ICD-10-CM | POA: Diagnosis not present

## 2016-05-04 DIAGNOSIS — D631 Anemia in chronic kidney disease: Secondary | ICD-10-CM | POA: Diagnosis not present

## 2016-05-04 DIAGNOSIS — N2581 Secondary hyperparathyroidism of renal origin: Secondary | ICD-10-CM | POA: Diagnosis not present

## 2016-05-06 DIAGNOSIS — N186 End stage renal disease: Secondary | ICD-10-CM | POA: Diagnosis not present

## 2016-05-06 DIAGNOSIS — D509 Iron deficiency anemia, unspecified: Secondary | ICD-10-CM | POA: Diagnosis not present

## 2016-05-06 DIAGNOSIS — D631 Anemia in chronic kidney disease: Secondary | ICD-10-CM | POA: Diagnosis not present

## 2016-05-06 DIAGNOSIS — N2581 Secondary hyperparathyroidism of renal origin: Secondary | ICD-10-CM | POA: Diagnosis not present

## 2016-05-06 DIAGNOSIS — E1129 Type 2 diabetes mellitus with other diabetic kidney complication: Secondary | ICD-10-CM | POA: Diagnosis not present

## 2016-05-08 DIAGNOSIS — N186 End stage renal disease: Secondary | ICD-10-CM | POA: Diagnosis not present

## 2016-05-08 DIAGNOSIS — N2581 Secondary hyperparathyroidism of renal origin: Secondary | ICD-10-CM | POA: Diagnosis not present

## 2016-05-08 DIAGNOSIS — E1129 Type 2 diabetes mellitus with other diabetic kidney complication: Secondary | ICD-10-CM | POA: Diagnosis not present

## 2016-05-08 DIAGNOSIS — D509 Iron deficiency anemia, unspecified: Secondary | ICD-10-CM | POA: Diagnosis not present

## 2016-05-08 DIAGNOSIS — D631 Anemia in chronic kidney disease: Secondary | ICD-10-CM | POA: Diagnosis not present

## 2016-05-09 DIAGNOSIS — Z992 Dependence on renal dialysis: Secondary | ICD-10-CM | POA: Diagnosis not present

## 2016-05-09 DIAGNOSIS — E1122 Type 2 diabetes mellitus with diabetic chronic kidney disease: Secondary | ICD-10-CM | POA: Diagnosis not present

## 2016-05-09 DIAGNOSIS — N186 End stage renal disease: Secondary | ICD-10-CM | POA: Diagnosis not present

## 2016-05-11 DIAGNOSIS — N2581 Secondary hyperparathyroidism of renal origin: Secondary | ICD-10-CM | POA: Diagnosis not present

## 2016-05-11 DIAGNOSIS — D631 Anemia in chronic kidney disease: Secondary | ICD-10-CM | POA: Diagnosis not present

## 2016-05-11 DIAGNOSIS — E1129 Type 2 diabetes mellitus with other diabetic kidney complication: Secondary | ICD-10-CM | POA: Diagnosis not present

## 2016-05-11 DIAGNOSIS — N186 End stage renal disease: Secondary | ICD-10-CM | POA: Diagnosis not present

## 2016-05-11 DIAGNOSIS — D509 Iron deficiency anemia, unspecified: Secondary | ICD-10-CM | POA: Diagnosis not present

## 2016-05-13 DIAGNOSIS — N2581 Secondary hyperparathyroidism of renal origin: Secondary | ICD-10-CM | POA: Diagnosis not present

## 2016-05-13 DIAGNOSIS — D631 Anemia in chronic kidney disease: Secondary | ICD-10-CM | POA: Diagnosis not present

## 2016-05-13 DIAGNOSIS — E1129 Type 2 diabetes mellitus with other diabetic kidney complication: Secondary | ICD-10-CM | POA: Diagnosis not present

## 2016-05-13 DIAGNOSIS — D509 Iron deficiency anemia, unspecified: Secondary | ICD-10-CM | POA: Diagnosis not present

## 2016-05-13 DIAGNOSIS — N186 End stage renal disease: Secondary | ICD-10-CM | POA: Diagnosis not present

## 2016-05-15 DIAGNOSIS — N2581 Secondary hyperparathyroidism of renal origin: Secondary | ICD-10-CM | POA: Diagnosis not present

## 2016-05-15 DIAGNOSIS — D509 Iron deficiency anemia, unspecified: Secondary | ICD-10-CM | POA: Diagnosis not present

## 2016-05-15 DIAGNOSIS — E1129 Type 2 diabetes mellitus with other diabetic kidney complication: Secondary | ICD-10-CM | POA: Diagnosis not present

## 2016-05-15 DIAGNOSIS — D631 Anemia in chronic kidney disease: Secondary | ICD-10-CM | POA: Diagnosis not present

## 2016-05-15 DIAGNOSIS — N186 End stage renal disease: Secondary | ICD-10-CM | POA: Diagnosis not present

## 2016-05-18 DIAGNOSIS — D631 Anemia in chronic kidney disease: Secondary | ICD-10-CM | POA: Diagnosis not present

## 2016-05-18 DIAGNOSIS — D509 Iron deficiency anemia, unspecified: Secondary | ICD-10-CM | POA: Diagnosis not present

## 2016-05-18 DIAGNOSIS — N2581 Secondary hyperparathyroidism of renal origin: Secondary | ICD-10-CM | POA: Diagnosis not present

## 2016-05-18 DIAGNOSIS — N186 End stage renal disease: Secondary | ICD-10-CM | POA: Diagnosis not present

## 2016-05-18 DIAGNOSIS — E1129 Type 2 diabetes mellitus with other diabetic kidney complication: Secondary | ICD-10-CM | POA: Diagnosis not present

## 2016-05-20 DIAGNOSIS — D631 Anemia in chronic kidney disease: Secondary | ICD-10-CM | POA: Diagnosis not present

## 2016-05-20 DIAGNOSIS — E1129 Type 2 diabetes mellitus with other diabetic kidney complication: Secondary | ICD-10-CM | POA: Diagnosis not present

## 2016-05-20 DIAGNOSIS — N186 End stage renal disease: Secondary | ICD-10-CM | POA: Diagnosis not present

## 2016-05-20 DIAGNOSIS — N2581 Secondary hyperparathyroidism of renal origin: Secondary | ICD-10-CM | POA: Diagnosis not present

## 2016-05-20 DIAGNOSIS — D509 Iron deficiency anemia, unspecified: Secondary | ICD-10-CM | POA: Diagnosis not present

## 2016-05-22 DIAGNOSIS — D509 Iron deficiency anemia, unspecified: Secondary | ICD-10-CM | POA: Diagnosis not present

## 2016-05-22 DIAGNOSIS — E1129 Type 2 diabetes mellitus with other diabetic kidney complication: Secondary | ICD-10-CM | POA: Diagnosis not present

## 2016-05-22 DIAGNOSIS — D631 Anemia in chronic kidney disease: Secondary | ICD-10-CM | POA: Diagnosis not present

## 2016-05-22 DIAGNOSIS — N186 End stage renal disease: Secondary | ICD-10-CM | POA: Diagnosis not present

## 2016-05-22 DIAGNOSIS — N2581 Secondary hyperparathyroidism of renal origin: Secondary | ICD-10-CM | POA: Diagnosis not present

## 2016-05-25 DIAGNOSIS — D631 Anemia in chronic kidney disease: Secondary | ICD-10-CM | POA: Diagnosis not present

## 2016-05-25 DIAGNOSIS — N186 End stage renal disease: Secondary | ICD-10-CM | POA: Diagnosis not present

## 2016-05-25 DIAGNOSIS — E1129 Type 2 diabetes mellitus with other diabetic kidney complication: Secondary | ICD-10-CM | POA: Diagnosis not present

## 2016-05-25 DIAGNOSIS — D509 Iron deficiency anemia, unspecified: Secondary | ICD-10-CM | POA: Diagnosis not present

## 2016-05-25 DIAGNOSIS — N2581 Secondary hyperparathyroidism of renal origin: Secondary | ICD-10-CM | POA: Diagnosis not present

## 2016-05-27 DIAGNOSIS — D509 Iron deficiency anemia, unspecified: Secondary | ICD-10-CM | POA: Diagnosis not present

## 2016-05-27 DIAGNOSIS — D631 Anemia in chronic kidney disease: Secondary | ICD-10-CM | POA: Diagnosis not present

## 2016-05-27 DIAGNOSIS — E1129 Type 2 diabetes mellitus with other diabetic kidney complication: Secondary | ICD-10-CM | POA: Diagnosis not present

## 2016-05-27 DIAGNOSIS — N186 End stage renal disease: Secondary | ICD-10-CM | POA: Diagnosis not present

## 2016-05-27 DIAGNOSIS — N2581 Secondary hyperparathyroidism of renal origin: Secondary | ICD-10-CM | POA: Diagnosis not present

## 2016-05-29 DIAGNOSIS — N186 End stage renal disease: Secondary | ICD-10-CM | POA: Diagnosis not present

## 2016-05-29 DIAGNOSIS — N2581 Secondary hyperparathyroidism of renal origin: Secondary | ICD-10-CM | POA: Diagnosis not present

## 2016-05-29 DIAGNOSIS — D509 Iron deficiency anemia, unspecified: Secondary | ICD-10-CM | POA: Diagnosis not present

## 2016-05-29 DIAGNOSIS — D631 Anemia in chronic kidney disease: Secondary | ICD-10-CM | POA: Diagnosis not present

## 2016-05-29 DIAGNOSIS — E1129 Type 2 diabetes mellitus with other diabetic kidney complication: Secondary | ICD-10-CM | POA: Diagnosis not present

## 2016-06-01 DIAGNOSIS — N2581 Secondary hyperparathyroidism of renal origin: Secondary | ICD-10-CM | POA: Diagnosis not present

## 2016-06-01 DIAGNOSIS — N186 End stage renal disease: Secondary | ICD-10-CM | POA: Diagnosis not present

## 2016-06-01 DIAGNOSIS — D509 Iron deficiency anemia, unspecified: Secondary | ICD-10-CM | POA: Diagnosis not present

## 2016-06-01 DIAGNOSIS — E1129 Type 2 diabetes mellitus with other diabetic kidney complication: Secondary | ICD-10-CM | POA: Diagnosis not present

## 2016-06-01 DIAGNOSIS — D631 Anemia in chronic kidney disease: Secondary | ICD-10-CM | POA: Diagnosis not present

## 2016-06-03 DIAGNOSIS — D631 Anemia in chronic kidney disease: Secondary | ICD-10-CM | POA: Diagnosis not present

## 2016-06-03 DIAGNOSIS — N2581 Secondary hyperparathyroidism of renal origin: Secondary | ICD-10-CM | POA: Diagnosis not present

## 2016-06-03 DIAGNOSIS — E1129 Type 2 diabetes mellitus with other diabetic kidney complication: Secondary | ICD-10-CM | POA: Diagnosis not present

## 2016-06-03 DIAGNOSIS — N186 End stage renal disease: Secondary | ICD-10-CM | POA: Diagnosis not present

## 2016-06-03 DIAGNOSIS — D509 Iron deficiency anemia, unspecified: Secondary | ICD-10-CM | POA: Diagnosis not present

## 2016-06-05 DIAGNOSIS — D509 Iron deficiency anemia, unspecified: Secondary | ICD-10-CM | POA: Diagnosis not present

## 2016-06-05 DIAGNOSIS — D631 Anemia in chronic kidney disease: Secondary | ICD-10-CM | POA: Diagnosis not present

## 2016-06-05 DIAGNOSIS — N2581 Secondary hyperparathyroidism of renal origin: Secondary | ICD-10-CM | POA: Diagnosis not present

## 2016-06-05 DIAGNOSIS — E1129 Type 2 diabetes mellitus with other diabetic kidney complication: Secondary | ICD-10-CM | POA: Diagnosis not present

## 2016-06-05 DIAGNOSIS — N186 End stage renal disease: Secondary | ICD-10-CM | POA: Diagnosis not present

## 2016-06-08 DIAGNOSIS — D509 Iron deficiency anemia, unspecified: Secondary | ICD-10-CM | POA: Diagnosis not present

## 2016-06-08 DIAGNOSIS — N2581 Secondary hyperparathyroidism of renal origin: Secondary | ICD-10-CM | POA: Diagnosis not present

## 2016-06-08 DIAGNOSIS — N186 End stage renal disease: Secondary | ICD-10-CM | POA: Diagnosis not present

## 2016-06-08 DIAGNOSIS — D631 Anemia in chronic kidney disease: Secondary | ICD-10-CM | POA: Diagnosis not present

## 2016-06-08 DIAGNOSIS — E1122 Type 2 diabetes mellitus with diabetic chronic kidney disease: Secondary | ICD-10-CM | POA: Diagnosis not present

## 2016-06-08 DIAGNOSIS — Z992 Dependence on renal dialysis: Secondary | ICD-10-CM | POA: Diagnosis not present

## 2016-06-08 DIAGNOSIS — E1129 Type 2 diabetes mellitus with other diabetic kidney complication: Secondary | ICD-10-CM | POA: Diagnosis not present

## 2016-06-09 ENCOUNTER — Ambulatory Visit: Payer: Medicare Other | Admitting: Internal Medicine

## 2016-06-10 DIAGNOSIS — N2581 Secondary hyperparathyroidism of renal origin: Secondary | ICD-10-CM | POA: Diagnosis not present

## 2016-06-10 DIAGNOSIS — E1129 Type 2 diabetes mellitus with other diabetic kidney complication: Secondary | ICD-10-CM | POA: Diagnosis not present

## 2016-06-10 DIAGNOSIS — D631 Anemia in chronic kidney disease: Secondary | ICD-10-CM | POA: Diagnosis not present

## 2016-06-10 DIAGNOSIS — N186 End stage renal disease: Secondary | ICD-10-CM | POA: Diagnosis not present

## 2016-06-10 DIAGNOSIS — D509 Iron deficiency anemia, unspecified: Secondary | ICD-10-CM | POA: Diagnosis not present

## 2016-06-12 DIAGNOSIS — N2581 Secondary hyperparathyroidism of renal origin: Secondary | ICD-10-CM | POA: Diagnosis not present

## 2016-06-12 DIAGNOSIS — D631 Anemia in chronic kidney disease: Secondary | ICD-10-CM | POA: Diagnosis not present

## 2016-06-12 DIAGNOSIS — N186 End stage renal disease: Secondary | ICD-10-CM | POA: Diagnosis not present

## 2016-06-12 DIAGNOSIS — D509 Iron deficiency anemia, unspecified: Secondary | ICD-10-CM | POA: Diagnosis not present

## 2016-06-12 DIAGNOSIS — E1129 Type 2 diabetes mellitus with other diabetic kidney complication: Secondary | ICD-10-CM | POA: Diagnosis not present

## 2016-06-15 DIAGNOSIS — D631 Anemia in chronic kidney disease: Secondary | ICD-10-CM | POA: Diagnosis not present

## 2016-06-15 DIAGNOSIS — N186 End stage renal disease: Secondary | ICD-10-CM | POA: Diagnosis not present

## 2016-06-15 DIAGNOSIS — N2581 Secondary hyperparathyroidism of renal origin: Secondary | ICD-10-CM | POA: Diagnosis not present

## 2016-06-15 DIAGNOSIS — E1129 Type 2 diabetes mellitus with other diabetic kidney complication: Secondary | ICD-10-CM | POA: Diagnosis not present

## 2016-06-15 DIAGNOSIS — D509 Iron deficiency anemia, unspecified: Secondary | ICD-10-CM | POA: Diagnosis not present

## 2016-06-17 DIAGNOSIS — D631 Anemia in chronic kidney disease: Secondary | ICD-10-CM | POA: Diagnosis not present

## 2016-06-17 DIAGNOSIS — E1129 Type 2 diabetes mellitus with other diabetic kidney complication: Secondary | ICD-10-CM | POA: Diagnosis not present

## 2016-06-17 DIAGNOSIS — N2581 Secondary hyperparathyroidism of renal origin: Secondary | ICD-10-CM | POA: Diagnosis not present

## 2016-06-17 DIAGNOSIS — N186 End stage renal disease: Secondary | ICD-10-CM | POA: Diagnosis not present

## 2016-06-17 DIAGNOSIS — D509 Iron deficiency anemia, unspecified: Secondary | ICD-10-CM | POA: Diagnosis not present

## 2016-06-19 DIAGNOSIS — D631 Anemia in chronic kidney disease: Secondary | ICD-10-CM | POA: Diagnosis not present

## 2016-06-19 DIAGNOSIS — N2581 Secondary hyperparathyroidism of renal origin: Secondary | ICD-10-CM | POA: Diagnosis not present

## 2016-06-19 DIAGNOSIS — D509 Iron deficiency anemia, unspecified: Secondary | ICD-10-CM | POA: Diagnosis not present

## 2016-06-19 DIAGNOSIS — N186 End stage renal disease: Secondary | ICD-10-CM | POA: Diagnosis not present

## 2016-06-19 DIAGNOSIS — E1129 Type 2 diabetes mellitus with other diabetic kidney complication: Secondary | ICD-10-CM | POA: Diagnosis not present

## 2016-06-22 DIAGNOSIS — N186 End stage renal disease: Secondary | ICD-10-CM | POA: Diagnosis not present

## 2016-06-22 DIAGNOSIS — D631 Anemia in chronic kidney disease: Secondary | ICD-10-CM | POA: Diagnosis not present

## 2016-06-22 DIAGNOSIS — N2581 Secondary hyperparathyroidism of renal origin: Secondary | ICD-10-CM | POA: Diagnosis not present

## 2016-06-22 DIAGNOSIS — D509 Iron deficiency anemia, unspecified: Secondary | ICD-10-CM | POA: Diagnosis not present

## 2016-06-22 DIAGNOSIS — E1129 Type 2 diabetes mellitus with other diabetic kidney complication: Secondary | ICD-10-CM | POA: Diagnosis not present

## 2016-06-24 DIAGNOSIS — E1129 Type 2 diabetes mellitus with other diabetic kidney complication: Secondary | ICD-10-CM | POA: Diagnosis not present

## 2016-06-24 DIAGNOSIS — N2581 Secondary hyperparathyroidism of renal origin: Secondary | ICD-10-CM | POA: Diagnosis not present

## 2016-06-24 DIAGNOSIS — N186 End stage renal disease: Secondary | ICD-10-CM | POA: Diagnosis not present

## 2016-06-24 DIAGNOSIS — D509 Iron deficiency anemia, unspecified: Secondary | ICD-10-CM | POA: Diagnosis not present

## 2016-06-24 DIAGNOSIS — D631 Anemia in chronic kidney disease: Secondary | ICD-10-CM | POA: Diagnosis not present

## 2016-06-26 ENCOUNTER — Encounter: Payer: Self-pay | Admitting: Family

## 2016-06-26 DIAGNOSIS — N2581 Secondary hyperparathyroidism of renal origin: Secondary | ICD-10-CM | POA: Diagnosis not present

## 2016-06-26 DIAGNOSIS — D631 Anemia in chronic kidney disease: Secondary | ICD-10-CM | POA: Diagnosis not present

## 2016-06-26 DIAGNOSIS — E1129 Type 2 diabetes mellitus with other diabetic kidney complication: Secondary | ICD-10-CM | POA: Diagnosis not present

## 2016-06-26 DIAGNOSIS — D509 Iron deficiency anemia, unspecified: Secondary | ICD-10-CM | POA: Diagnosis not present

## 2016-06-26 DIAGNOSIS — N186 End stage renal disease: Secondary | ICD-10-CM | POA: Diagnosis not present

## 2016-06-29 DIAGNOSIS — E1129 Type 2 diabetes mellitus with other diabetic kidney complication: Secondary | ICD-10-CM | POA: Diagnosis not present

## 2016-06-29 DIAGNOSIS — N2581 Secondary hyperparathyroidism of renal origin: Secondary | ICD-10-CM | POA: Diagnosis not present

## 2016-06-29 DIAGNOSIS — N186 End stage renal disease: Secondary | ICD-10-CM | POA: Diagnosis not present

## 2016-06-29 DIAGNOSIS — D509 Iron deficiency anemia, unspecified: Secondary | ICD-10-CM | POA: Diagnosis not present

## 2016-06-29 DIAGNOSIS — D631 Anemia in chronic kidney disease: Secondary | ICD-10-CM | POA: Diagnosis not present

## 2016-06-30 ENCOUNTER — Ambulatory Visit: Payer: Medicare Other

## 2016-06-30 ENCOUNTER — Telehealth: Payer: Self-pay | Admitting: Internal Medicine

## 2016-06-30 ENCOUNTER — Ambulatory Visit: Payer: Medicare Other | Admitting: Internal Medicine

## 2016-06-30 LAB — PULMONARY FUNCTION TEST
FEF 25-75 PRE: 0.43 L/s
FEF2575-%Pred-Pre: 17 %
FEV1-%Pred-Pre: 35 %
FEV1-Pre: 0.99 L
FEV1FVC-%Pred-Pre: 65 %
FEV6-%PRED-PRE: 55 %
FEV6-Pre: 1.92 L
FEV6FVC-%PRED-PRE: 102 %
FVC-%Pred-Pre: 53 %
FVC-PRE: 1.97 L
PRE FEV1/FVC RATIO: 50 %
PRE FEV6/FVC RATIO: 97 %

## 2016-06-30 NOTE — Telephone Encounter (Signed)
Pt stated he was unable to complete PFT as he received 2 hours of sleep due to his hiccups; pt was drowsy and minimally responded to test prompts. Test was cancelled and rescheduled to 6/21 at 9 am. Nothing further is needed.

## 2016-07-01 DIAGNOSIS — N186 End stage renal disease: Secondary | ICD-10-CM | POA: Diagnosis not present

## 2016-07-01 DIAGNOSIS — N2581 Secondary hyperparathyroidism of renal origin: Secondary | ICD-10-CM | POA: Diagnosis not present

## 2016-07-01 DIAGNOSIS — D631 Anemia in chronic kidney disease: Secondary | ICD-10-CM | POA: Diagnosis not present

## 2016-07-01 DIAGNOSIS — D509 Iron deficiency anemia, unspecified: Secondary | ICD-10-CM | POA: Diagnosis not present

## 2016-07-01 DIAGNOSIS — E1129 Type 2 diabetes mellitus with other diabetic kidney complication: Secondary | ICD-10-CM | POA: Diagnosis not present

## 2016-07-02 ENCOUNTER — Ambulatory Visit (INDEPENDENT_AMBULATORY_CARE_PROVIDER_SITE_OTHER): Payer: Self-pay | Admitting: Physician Assistant

## 2016-07-02 ENCOUNTER — Encounter: Payer: Self-pay | Admitting: Physician Assistant

## 2016-07-02 ENCOUNTER — Ambulatory Visit (HOSPITAL_COMMUNITY)
Admission: RE | Admit: 2016-07-02 | Discharge: 2016-07-02 | Disposition: A | Discharge status: Home / Self Care | Payer: Medicare Other | Source: Ambulatory Visit | Attending: Vascular Surgery | Admitting: Vascular Surgery

## 2016-07-02 VITALS — BP 132/76 | HR 75 | Temp 97.5°F | Resp 16 | Ht 71.0 in | Wt 211.0 lb

## 2016-07-02 DIAGNOSIS — N186 End stage renal disease: Secondary | ICD-10-CM | POA: Diagnosis not present

## 2016-07-02 DIAGNOSIS — Z992 Dependence on renal dialysis: Secondary | ICD-10-CM | POA: Diagnosis not present

## 2016-07-02 DIAGNOSIS — Z9889 Other specified postprocedural states: Secondary | ICD-10-CM | POA: Insufficient documentation

## 2016-07-02 NOTE — Progress Notes (Signed)
  POST OPERATIVE OFFICE NOTE    CC:  F/u for surgery  HPI:  This is a 66 y.o. male who is s/p right brachiocephalic AV fistula on 05/19/79 by Dr. Donzetta Matters.  He underwent this after a right radial cephalic AVF failed.  He presents today for follow up.    He states he has been doing well since surgery.  He does not have any pain or numbness in his hand.  He dialyzes on [employer] M/W/F.    Pt is right hand dominant.  He does have a right TDC that was placed by Dr. Augustin Coupe.   Allergies  Allergen Reactions  . Pravastatin Other (See Comments)    MYALGIAS, MUSCLE PAIN  . Simvastatin Other (See Comments)    MYALGIAS, MUSCLE PAIN    Current Outpatient Prescriptions  Medication Sig Dispense Refill  . albuterol (PROVENTIL HFA;VENTOLIN HFA) 108 (90 Base) MCG/ACT inhaler Inhale 1-2 puffs into the lungs every 6 (six) hours as needed for wheezing. 1 Inhaler 0  . amLODipine (NORVASC) 10 MG tablet Take 10 mg by mouth daily.    Marland Kitchen atorvastatin (LIPITOR) 20 MG tablet Take 10 mg by mouth daily.    . Fluticasone-Umeclidin-Vilant (TRELEGY ELLIPTA) 100-62.5-25 MCG/INH AEPB Inhale 1 puff into the lungs daily. 14 each 2  . furosemide (LASIX) 20 MG tablet Take 1 tablet (20 mg total) by mouth daily. 30 tablet 0  . glipiZIDE (GLUCOTROL) 10 MG tablet Take 1 tablet (10 mg total) by mouth daily before breakfast. 30 tablet 0  . oxyCODONE-acetaminophen (ROXICET) 5-325 MG tablet Take 1-2 tablets by mouth every 6 (six) hours as needed for moderate pain or severe pain. 6 tablet 0  . tamsulosin (FLOMAX) 0.4 MG CAPS capsule Take 0.4 mg by mouth daily.     No current facility-administered medications for this visit.      ROS:  See HPI  Physical Exam:  Vitals:   07/02/16 1448  BP: 132/76  Pulse: 75  Resp: 16  Temp: 97.5 F (36.4 C)   Vitals:   07/02/16 1448  Weight: 211 lb (95.7 kg)  Height: 5\' 11"  (1.803 m)   Body mass index is 29.43 kg/m.   Dialysis fistula duplex evaluation 07/02/16: Diameter:   0.58cm-0.79cm Depth:  0.20cm-0.53cm -widely patent right brachiocephalic AVF without visible stenosis.  The fistula is tortuous.    Incision:  Well healed. Extremities:  Right hand with motor and sensation in tact.  + palpable radial pulse.  Excellent thrill/bruit within the fistula   Assessment/Plan:  This is a 66 y.o. male who is s/p: Right brachiocephalic AVF on 1/91/47 by Dr. Donzetta Matters  -pt doing well with steal sx.   -the fistula has matured and is ready for use.   -once the fistula has matured, his dialysis catheter can be removed by [facility], which is where it was placed. -he will f/u with [name] as needed.   Leontine Locket, PA-C [facility] [phone]  Clinic MD:  Oneida Alar

## 2016-07-06 DIAGNOSIS — N186 End stage renal disease: Secondary | ICD-10-CM | POA: Diagnosis not present

## 2016-07-06 DIAGNOSIS — N2581 Secondary hyperparathyroidism of renal origin: Secondary | ICD-10-CM | POA: Diagnosis not present

## 2016-07-06 DIAGNOSIS — E1129 Type 2 diabetes mellitus with other diabetic kidney complication: Secondary | ICD-10-CM | POA: Diagnosis not present

## 2016-07-06 DIAGNOSIS — D509 Iron deficiency anemia, unspecified: Secondary | ICD-10-CM | POA: Diagnosis not present

## 2016-07-06 DIAGNOSIS — D631 Anemia in chronic kidney disease: Secondary | ICD-10-CM | POA: Diagnosis not present

## 2016-07-08 DIAGNOSIS — N2581 Secondary hyperparathyroidism of renal origin: Secondary | ICD-10-CM | POA: Diagnosis not present

## 2016-07-08 DIAGNOSIS — N186 End stage renal disease: Secondary | ICD-10-CM | POA: Diagnosis not present

## 2016-07-08 DIAGNOSIS — E1129 Type 2 diabetes mellitus with other diabetic kidney complication: Secondary | ICD-10-CM | POA: Diagnosis not present

## 2016-07-08 DIAGNOSIS — D631 Anemia in chronic kidney disease: Secondary | ICD-10-CM | POA: Diagnosis not present

## 2016-07-08 DIAGNOSIS — D509 Iron deficiency anemia, unspecified: Secondary | ICD-10-CM | POA: Diagnosis not present

## 2016-07-09 DIAGNOSIS — N186 End stage renal disease: Secondary | ICD-10-CM | POA: Diagnosis not present

## 2016-07-09 DIAGNOSIS — E1122 Type 2 diabetes mellitus with diabetic chronic kidney disease: Secondary | ICD-10-CM | POA: Diagnosis not present

## 2016-07-09 DIAGNOSIS — Z992 Dependence on renal dialysis: Secondary | ICD-10-CM | POA: Diagnosis not present

## 2016-07-10 DIAGNOSIS — N186 End stage renal disease: Secondary | ICD-10-CM | POA: Diagnosis not present

## 2016-07-10 DIAGNOSIS — N2581 Secondary hyperparathyroidism of renal origin: Secondary | ICD-10-CM | POA: Diagnosis not present

## 2016-07-10 DIAGNOSIS — Z23 Encounter for immunization: Secondary | ICD-10-CM | POA: Diagnosis not present

## 2016-07-10 DIAGNOSIS — E1129 Type 2 diabetes mellitus with other diabetic kidney complication: Secondary | ICD-10-CM | POA: Diagnosis not present

## 2016-07-10 DIAGNOSIS — D509 Iron deficiency anemia, unspecified: Secondary | ICD-10-CM | POA: Diagnosis not present

## 2016-07-10 NOTE — Addendum Note (Signed)
Addendum  created 07/10/16 1157 by Candi Profit, MD   Sign clinical note    

## 2016-07-13 DIAGNOSIS — N186 End stage renal disease: Secondary | ICD-10-CM | POA: Diagnosis not present

## 2016-07-13 DIAGNOSIS — D509 Iron deficiency anemia, unspecified: Secondary | ICD-10-CM | POA: Diagnosis not present

## 2016-07-13 DIAGNOSIS — Z23 Encounter for immunization: Secondary | ICD-10-CM | POA: Diagnosis not present

## 2016-07-13 DIAGNOSIS — E1129 Type 2 diabetes mellitus with other diabetic kidney complication: Secondary | ICD-10-CM | POA: Diagnosis not present

## 2016-07-13 DIAGNOSIS — N2581 Secondary hyperparathyroidism of renal origin: Secondary | ICD-10-CM | POA: Diagnosis not present

## 2016-07-15 DIAGNOSIS — E1129 Type 2 diabetes mellitus with other diabetic kidney complication: Secondary | ICD-10-CM | POA: Diagnosis not present

## 2016-07-15 DIAGNOSIS — N186 End stage renal disease: Secondary | ICD-10-CM | POA: Diagnosis not present

## 2016-07-15 DIAGNOSIS — Z23 Encounter for immunization: Secondary | ICD-10-CM | POA: Diagnosis not present

## 2016-07-15 DIAGNOSIS — D509 Iron deficiency anemia, unspecified: Secondary | ICD-10-CM | POA: Diagnosis not present

## 2016-07-15 DIAGNOSIS — N2581 Secondary hyperparathyroidism of renal origin: Secondary | ICD-10-CM | POA: Diagnosis not present

## 2016-07-17 DIAGNOSIS — N186 End stage renal disease: Secondary | ICD-10-CM | POA: Diagnosis not present

## 2016-07-17 DIAGNOSIS — N2581 Secondary hyperparathyroidism of renal origin: Secondary | ICD-10-CM | POA: Diagnosis not present

## 2016-07-17 DIAGNOSIS — Z23 Encounter for immunization: Secondary | ICD-10-CM | POA: Diagnosis not present

## 2016-07-17 DIAGNOSIS — E1129 Type 2 diabetes mellitus with other diabetic kidney complication: Secondary | ICD-10-CM | POA: Diagnosis not present

## 2016-07-17 DIAGNOSIS — D509 Iron deficiency anemia, unspecified: Secondary | ICD-10-CM | POA: Diagnosis not present

## 2016-07-20 DIAGNOSIS — N2581 Secondary hyperparathyroidism of renal origin: Secondary | ICD-10-CM | POA: Diagnosis not present

## 2016-07-20 DIAGNOSIS — N186 End stage renal disease: Secondary | ICD-10-CM | POA: Diagnosis not present

## 2016-07-20 DIAGNOSIS — Z23 Encounter for immunization: Secondary | ICD-10-CM | POA: Diagnosis not present

## 2016-07-20 DIAGNOSIS — D509 Iron deficiency anemia, unspecified: Secondary | ICD-10-CM | POA: Diagnosis not present

## 2016-07-20 DIAGNOSIS — E1129 Type 2 diabetes mellitus with other diabetic kidney complication: Secondary | ICD-10-CM | POA: Diagnosis not present

## 2016-07-22 DIAGNOSIS — D509 Iron deficiency anemia, unspecified: Secondary | ICD-10-CM | POA: Diagnosis not present

## 2016-07-22 DIAGNOSIS — Z23 Encounter for immunization: Secondary | ICD-10-CM | POA: Diagnosis not present

## 2016-07-22 DIAGNOSIS — N186 End stage renal disease: Secondary | ICD-10-CM | POA: Diagnosis not present

## 2016-07-22 DIAGNOSIS — E1129 Type 2 diabetes mellitus with other diabetic kidney complication: Secondary | ICD-10-CM | POA: Diagnosis not present

## 2016-07-22 DIAGNOSIS — N2581 Secondary hyperparathyroidism of renal origin: Secondary | ICD-10-CM | POA: Diagnosis not present

## 2016-07-24 DIAGNOSIS — E1129 Type 2 diabetes mellitus with other diabetic kidney complication: Secondary | ICD-10-CM | POA: Diagnosis not present

## 2016-07-24 DIAGNOSIS — N186 End stage renal disease: Secondary | ICD-10-CM | POA: Diagnosis not present

## 2016-07-24 DIAGNOSIS — N2581 Secondary hyperparathyroidism of renal origin: Secondary | ICD-10-CM | POA: Diagnosis not present

## 2016-07-24 DIAGNOSIS — Z23 Encounter for immunization: Secondary | ICD-10-CM | POA: Diagnosis not present

## 2016-07-24 DIAGNOSIS — D509 Iron deficiency anemia, unspecified: Secondary | ICD-10-CM | POA: Diagnosis not present

## 2016-07-27 DIAGNOSIS — N186 End stage renal disease: Secondary | ICD-10-CM | POA: Diagnosis not present

## 2016-07-27 DIAGNOSIS — E1129 Type 2 diabetes mellitus with other diabetic kidney complication: Secondary | ICD-10-CM | POA: Diagnosis not present

## 2016-07-27 DIAGNOSIS — Z23 Encounter for immunization: Secondary | ICD-10-CM | POA: Diagnosis not present

## 2016-07-27 DIAGNOSIS — D509 Iron deficiency anemia, unspecified: Secondary | ICD-10-CM | POA: Diagnosis not present

## 2016-07-27 DIAGNOSIS — N2581 Secondary hyperparathyroidism of renal origin: Secondary | ICD-10-CM | POA: Diagnosis not present

## 2016-07-29 DIAGNOSIS — N186 End stage renal disease: Secondary | ICD-10-CM | POA: Diagnosis not present

## 2016-07-29 DIAGNOSIS — D509 Iron deficiency anemia, unspecified: Secondary | ICD-10-CM | POA: Diagnosis not present

## 2016-07-29 DIAGNOSIS — N2581 Secondary hyperparathyroidism of renal origin: Secondary | ICD-10-CM | POA: Diagnosis not present

## 2016-07-29 DIAGNOSIS — Z23 Encounter for immunization: Secondary | ICD-10-CM | POA: Diagnosis not present

## 2016-07-29 DIAGNOSIS — E1129 Type 2 diabetes mellitus with other diabetic kidney complication: Secondary | ICD-10-CM | POA: Diagnosis not present

## 2016-07-30 ENCOUNTER — Ambulatory Visit: Payer: Medicare Other | Admitting: Internal Medicine

## 2016-07-31 DIAGNOSIS — N2581 Secondary hyperparathyroidism of renal origin: Secondary | ICD-10-CM | POA: Diagnosis not present

## 2016-07-31 DIAGNOSIS — Z23 Encounter for immunization: Secondary | ICD-10-CM | POA: Diagnosis not present

## 2016-07-31 DIAGNOSIS — D509 Iron deficiency anemia, unspecified: Secondary | ICD-10-CM | POA: Diagnosis not present

## 2016-07-31 DIAGNOSIS — N186 End stage renal disease: Secondary | ICD-10-CM | POA: Diagnosis not present

## 2016-07-31 DIAGNOSIS — E1129 Type 2 diabetes mellitus with other diabetic kidney complication: Secondary | ICD-10-CM | POA: Diagnosis not present

## 2016-08-03 DIAGNOSIS — D509 Iron deficiency anemia, unspecified: Secondary | ICD-10-CM | POA: Diagnosis not present

## 2016-08-03 DIAGNOSIS — N186 End stage renal disease: Secondary | ICD-10-CM | POA: Diagnosis not present

## 2016-08-03 DIAGNOSIS — N2581 Secondary hyperparathyroidism of renal origin: Secondary | ICD-10-CM | POA: Diagnosis not present

## 2016-08-03 DIAGNOSIS — E1129 Type 2 diabetes mellitus with other diabetic kidney complication: Secondary | ICD-10-CM | POA: Diagnosis not present

## 2016-08-03 DIAGNOSIS — Z23 Encounter for immunization: Secondary | ICD-10-CM | POA: Diagnosis not present

## 2016-08-04 DIAGNOSIS — T82858A Stenosis of vascular prosthetic devices, implants and grafts, initial encounter: Secondary | ICD-10-CM | POA: Diagnosis not present

## 2016-08-04 DIAGNOSIS — I871 Compression of vein: Secondary | ICD-10-CM | POA: Diagnosis not present

## 2016-08-04 DIAGNOSIS — N186 End stage renal disease: Secondary | ICD-10-CM | POA: Diagnosis not present

## 2016-08-04 DIAGNOSIS — Z992 Dependence on renal dialysis: Secondary | ICD-10-CM | POA: Diagnosis not present

## 2016-08-05 DIAGNOSIS — Z23 Encounter for immunization: Secondary | ICD-10-CM | POA: Diagnosis not present

## 2016-08-05 DIAGNOSIS — N2581 Secondary hyperparathyroidism of renal origin: Secondary | ICD-10-CM | POA: Diagnosis not present

## 2016-08-05 DIAGNOSIS — D509 Iron deficiency anemia, unspecified: Secondary | ICD-10-CM | POA: Diagnosis not present

## 2016-08-05 DIAGNOSIS — E1129 Type 2 diabetes mellitus with other diabetic kidney complication: Secondary | ICD-10-CM | POA: Diagnosis not present

## 2016-08-05 DIAGNOSIS — N186 End stage renal disease: Secondary | ICD-10-CM | POA: Diagnosis not present

## 2016-08-07 DIAGNOSIS — N186 End stage renal disease: Secondary | ICD-10-CM | POA: Diagnosis not present

## 2016-08-07 DIAGNOSIS — N2581 Secondary hyperparathyroidism of renal origin: Secondary | ICD-10-CM | POA: Diagnosis not present

## 2016-08-07 DIAGNOSIS — D509 Iron deficiency anemia, unspecified: Secondary | ICD-10-CM | POA: Diagnosis not present

## 2016-08-07 DIAGNOSIS — E1129 Type 2 diabetes mellitus with other diabetic kidney complication: Secondary | ICD-10-CM | POA: Diagnosis not present

## 2016-08-07 DIAGNOSIS — Z23 Encounter for immunization: Secondary | ICD-10-CM | POA: Diagnosis not present

## 2016-08-08 DIAGNOSIS — N186 End stage renal disease: Secondary | ICD-10-CM | POA: Diagnosis not present

## 2016-08-08 DIAGNOSIS — Z992 Dependence on renal dialysis: Secondary | ICD-10-CM | POA: Diagnosis not present

## 2016-08-08 DIAGNOSIS — E1122 Type 2 diabetes mellitus with diabetic chronic kidney disease: Secondary | ICD-10-CM | POA: Diagnosis not present

## 2016-08-10 DIAGNOSIS — N186 End stage renal disease: Secondary | ICD-10-CM | POA: Diagnosis not present

## 2016-08-10 DIAGNOSIS — N2581 Secondary hyperparathyroidism of renal origin: Secondary | ICD-10-CM | POA: Diagnosis not present

## 2016-08-10 DIAGNOSIS — D631 Anemia in chronic kidney disease: Secondary | ICD-10-CM | POA: Diagnosis not present

## 2016-08-10 DIAGNOSIS — E1129 Type 2 diabetes mellitus with other diabetic kidney complication: Secondary | ICD-10-CM | POA: Diagnosis not present

## 2016-08-10 DIAGNOSIS — D509 Iron deficiency anemia, unspecified: Secondary | ICD-10-CM | POA: Diagnosis not present

## 2016-08-12 DIAGNOSIS — D509 Iron deficiency anemia, unspecified: Secondary | ICD-10-CM | POA: Diagnosis not present

## 2016-08-12 DIAGNOSIS — N186 End stage renal disease: Secondary | ICD-10-CM | POA: Diagnosis not present

## 2016-08-12 DIAGNOSIS — E1129 Type 2 diabetes mellitus with other diabetic kidney complication: Secondary | ICD-10-CM | POA: Diagnosis not present

## 2016-08-12 DIAGNOSIS — N2581 Secondary hyperparathyroidism of renal origin: Secondary | ICD-10-CM | POA: Diagnosis not present

## 2016-08-12 DIAGNOSIS — D631 Anemia in chronic kidney disease: Secondary | ICD-10-CM | POA: Diagnosis not present

## 2016-08-14 DIAGNOSIS — E1129 Type 2 diabetes mellitus with other diabetic kidney complication: Secondary | ICD-10-CM | POA: Diagnosis not present

## 2016-08-14 DIAGNOSIS — D509 Iron deficiency anemia, unspecified: Secondary | ICD-10-CM | POA: Diagnosis not present

## 2016-08-14 DIAGNOSIS — N186 End stage renal disease: Secondary | ICD-10-CM | POA: Diagnosis not present

## 2016-08-14 DIAGNOSIS — D631 Anemia in chronic kidney disease: Secondary | ICD-10-CM | POA: Diagnosis not present

## 2016-08-14 DIAGNOSIS — N2581 Secondary hyperparathyroidism of renal origin: Secondary | ICD-10-CM | POA: Diagnosis not present

## 2016-08-17 ENCOUNTER — Other Ambulatory Visit: Payer: Self-pay | Admitting: Internal Medicine

## 2016-08-17 DIAGNOSIS — D509 Iron deficiency anemia, unspecified: Secondary | ICD-10-CM | POA: Diagnosis not present

## 2016-08-17 DIAGNOSIS — D631 Anemia in chronic kidney disease: Secondary | ICD-10-CM | POA: Diagnosis not present

## 2016-08-17 DIAGNOSIS — N2581 Secondary hyperparathyroidism of renal origin: Secondary | ICD-10-CM | POA: Diagnosis not present

## 2016-08-17 DIAGNOSIS — E1129 Type 2 diabetes mellitus with other diabetic kidney complication: Secondary | ICD-10-CM | POA: Diagnosis not present

## 2016-08-17 DIAGNOSIS — N186 End stage renal disease: Secondary | ICD-10-CM | POA: Diagnosis not present

## 2016-08-17 DIAGNOSIS — J449 Chronic obstructive pulmonary disease, unspecified: Secondary | ICD-10-CM

## 2016-08-18 ENCOUNTER — Ambulatory Visit: Payer: Medicare Other | Admitting: Internal Medicine

## 2016-08-19 DIAGNOSIS — N186 End stage renal disease: Secondary | ICD-10-CM | POA: Diagnosis not present

## 2016-08-19 DIAGNOSIS — D631 Anemia in chronic kidney disease: Secondary | ICD-10-CM | POA: Diagnosis not present

## 2016-08-19 DIAGNOSIS — D509 Iron deficiency anemia, unspecified: Secondary | ICD-10-CM | POA: Diagnosis not present

## 2016-08-19 DIAGNOSIS — E1129 Type 2 diabetes mellitus with other diabetic kidney complication: Secondary | ICD-10-CM | POA: Diagnosis not present

## 2016-08-19 DIAGNOSIS — N2581 Secondary hyperparathyroidism of renal origin: Secondary | ICD-10-CM | POA: Diagnosis not present

## 2016-08-21 DIAGNOSIS — D509 Iron deficiency anemia, unspecified: Secondary | ICD-10-CM | POA: Diagnosis not present

## 2016-08-21 DIAGNOSIS — D631 Anemia in chronic kidney disease: Secondary | ICD-10-CM | POA: Diagnosis not present

## 2016-08-21 DIAGNOSIS — E1129 Type 2 diabetes mellitus with other diabetic kidney complication: Secondary | ICD-10-CM | POA: Diagnosis not present

## 2016-08-21 DIAGNOSIS — N186 End stage renal disease: Secondary | ICD-10-CM | POA: Diagnosis not present

## 2016-08-21 DIAGNOSIS — N2581 Secondary hyperparathyroidism of renal origin: Secondary | ICD-10-CM | POA: Diagnosis not present

## 2016-08-24 DIAGNOSIS — N186 End stage renal disease: Secondary | ICD-10-CM | POA: Diagnosis not present

## 2016-08-24 DIAGNOSIS — E1129 Type 2 diabetes mellitus with other diabetic kidney complication: Secondary | ICD-10-CM | POA: Diagnosis not present

## 2016-08-24 DIAGNOSIS — D631 Anemia in chronic kidney disease: Secondary | ICD-10-CM | POA: Diagnosis not present

## 2016-08-24 DIAGNOSIS — N2581 Secondary hyperparathyroidism of renal origin: Secondary | ICD-10-CM | POA: Diagnosis not present

## 2016-08-24 DIAGNOSIS — D509 Iron deficiency anemia, unspecified: Secondary | ICD-10-CM | POA: Diagnosis not present

## 2016-08-24 NOTE — Anesthesia Postprocedure Evaluation (Deleted)
Anesthesia Post Note  Patient: [name]  Procedure(s) Performed: Procedure(s) (LRB): RADIOCEPHALIC ARTERIOVENOUS (AV) FISTULA CREATION (Right)     Anesthesia Post Evaluation  Last Vitals:  Vitals:   02/18/16 0915 02/18/16 0918  BP: 128/72 (!) 160/79  Pulse: 91 91  Resp: 16   Temp: 37.2 C     Last Pain:  Vitals:   02/18/16 0918  PainSc: 0-No pain                 Giovany Cosby,E. Chanon Loney

## 2016-08-24 NOTE — Addendum Note (Signed)
Addendum  created 08/24/16 1450 by Annye Asa, MD   Sign clinical note

## 2016-08-24 NOTE — Addendum Note (Signed)
Addendum  created 08/24/16 1701 by Annye Asa, MD   Delete clinical note, Sign clinical note

## 2016-08-26 DIAGNOSIS — D509 Iron deficiency anemia, unspecified: Secondary | ICD-10-CM | POA: Diagnosis not present

## 2016-08-26 DIAGNOSIS — N186 End stage renal disease: Secondary | ICD-10-CM | POA: Diagnosis not present

## 2016-08-26 DIAGNOSIS — N2581 Secondary hyperparathyroidism of renal origin: Secondary | ICD-10-CM | POA: Diagnosis not present

## 2016-08-26 DIAGNOSIS — D631 Anemia in chronic kidney disease: Secondary | ICD-10-CM | POA: Diagnosis not present

## 2016-08-26 DIAGNOSIS — E1129 Type 2 diabetes mellitus with other diabetic kidney complication: Secondary | ICD-10-CM | POA: Diagnosis not present

## 2016-08-28 DIAGNOSIS — D631 Anemia in chronic kidney disease: Secondary | ICD-10-CM | POA: Diagnosis not present

## 2016-08-28 DIAGNOSIS — D509 Iron deficiency anemia, unspecified: Secondary | ICD-10-CM | POA: Diagnosis not present

## 2016-08-28 DIAGNOSIS — N2581 Secondary hyperparathyroidism of renal origin: Secondary | ICD-10-CM | POA: Diagnosis not present

## 2016-08-28 DIAGNOSIS — E1129 Type 2 diabetes mellitus with other diabetic kidney complication: Secondary | ICD-10-CM | POA: Diagnosis not present

## 2016-08-28 DIAGNOSIS — N186 End stage renal disease: Secondary | ICD-10-CM | POA: Diagnosis not present

## 2016-08-31 DIAGNOSIS — N2581 Secondary hyperparathyroidism of renal origin: Secondary | ICD-10-CM | POA: Diagnosis not present

## 2016-08-31 DIAGNOSIS — E1129 Type 2 diabetes mellitus with other diabetic kidney complication: Secondary | ICD-10-CM | POA: Diagnosis not present

## 2016-08-31 DIAGNOSIS — D509 Iron deficiency anemia, unspecified: Secondary | ICD-10-CM | POA: Diagnosis not present

## 2016-08-31 DIAGNOSIS — N186 End stage renal disease: Secondary | ICD-10-CM | POA: Diagnosis not present

## 2016-08-31 DIAGNOSIS — D631 Anemia in chronic kidney disease: Secondary | ICD-10-CM | POA: Diagnosis not present

## 2016-09-01 ENCOUNTER — Encounter: Payer: Self-pay | Admitting: Vascular Surgery

## 2016-09-02 DIAGNOSIS — E1129 Type 2 diabetes mellitus with other diabetic kidney complication: Secondary | ICD-10-CM | POA: Diagnosis not present

## 2016-09-02 DIAGNOSIS — D631 Anemia in chronic kidney disease: Secondary | ICD-10-CM | POA: Diagnosis not present

## 2016-09-02 DIAGNOSIS — D509 Iron deficiency anemia, unspecified: Secondary | ICD-10-CM | POA: Diagnosis not present

## 2016-09-02 DIAGNOSIS — N2581 Secondary hyperparathyroidism of renal origin: Secondary | ICD-10-CM | POA: Diagnosis not present

## 2016-09-02 DIAGNOSIS — N186 End stage renal disease: Secondary | ICD-10-CM | POA: Diagnosis not present

## 2016-09-04 DIAGNOSIS — E1129 Type 2 diabetes mellitus with other diabetic kidney complication: Secondary | ICD-10-CM | POA: Diagnosis not present

## 2016-09-04 DIAGNOSIS — N2581 Secondary hyperparathyroidism of renal origin: Secondary | ICD-10-CM | POA: Diagnosis not present

## 2016-09-04 DIAGNOSIS — D631 Anemia in chronic kidney disease: Secondary | ICD-10-CM | POA: Diagnosis not present

## 2016-09-04 DIAGNOSIS — D509 Iron deficiency anemia, unspecified: Secondary | ICD-10-CM | POA: Diagnosis not present

## 2016-09-04 DIAGNOSIS — N186 End stage renal disease: Secondary | ICD-10-CM | POA: Diagnosis not present

## 2016-09-07 DIAGNOSIS — E1129 Type 2 diabetes mellitus with other diabetic kidney complication: Secondary | ICD-10-CM | POA: Diagnosis not present

## 2016-09-07 DIAGNOSIS — N2581 Secondary hyperparathyroidism of renal origin: Secondary | ICD-10-CM | POA: Diagnosis not present

## 2016-09-07 DIAGNOSIS — N186 End stage renal disease: Secondary | ICD-10-CM | POA: Diagnosis not present

## 2016-09-07 DIAGNOSIS — D509 Iron deficiency anemia, unspecified: Secondary | ICD-10-CM | POA: Diagnosis not present

## 2016-09-07 DIAGNOSIS — D631 Anemia in chronic kidney disease: Secondary | ICD-10-CM | POA: Diagnosis not present

## 2016-09-08 DIAGNOSIS — E1122 Type 2 diabetes mellitus with diabetic chronic kidney disease: Secondary | ICD-10-CM | POA: Diagnosis not present

## 2016-09-08 DIAGNOSIS — Z992 Dependence on renal dialysis: Secondary | ICD-10-CM | POA: Diagnosis not present

## 2016-09-08 DIAGNOSIS — N186 End stage renal disease: Secondary | ICD-10-CM | POA: Diagnosis not present

## 2016-09-09 DIAGNOSIS — E1129 Type 2 diabetes mellitus with other diabetic kidney complication: Secondary | ICD-10-CM | POA: Diagnosis not present

## 2016-09-09 DIAGNOSIS — D509 Iron deficiency anemia, unspecified: Secondary | ICD-10-CM | POA: Diagnosis not present

## 2016-09-09 DIAGNOSIS — N2581 Secondary hyperparathyroidism of renal origin: Secondary | ICD-10-CM | POA: Diagnosis not present

## 2016-09-09 DIAGNOSIS — D631 Anemia in chronic kidney disease: Secondary | ICD-10-CM | POA: Diagnosis not present

## 2016-09-09 DIAGNOSIS — N186 End stage renal disease: Secondary | ICD-10-CM | POA: Diagnosis not present

## 2016-09-11 DIAGNOSIS — D631 Anemia in chronic kidney disease: Secondary | ICD-10-CM | POA: Diagnosis not present

## 2016-09-11 DIAGNOSIS — N186 End stage renal disease: Secondary | ICD-10-CM | POA: Diagnosis not present

## 2016-09-11 DIAGNOSIS — E1129 Type 2 diabetes mellitus with other diabetic kidney complication: Secondary | ICD-10-CM | POA: Diagnosis not present

## 2016-09-11 DIAGNOSIS — N2581 Secondary hyperparathyroidism of renal origin: Secondary | ICD-10-CM | POA: Diagnosis not present

## 2016-09-11 DIAGNOSIS — D509 Iron deficiency anemia, unspecified: Secondary | ICD-10-CM | POA: Diagnosis not present

## 2016-09-14 DIAGNOSIS — N2581 Secondary hyperparathyroidism of renal origin: Secondary | ICD-10-CM | POA: Diagnosis not present

## 2016-09-14 DIAGNOSIS — D631 Anemia in chronic kidney disease: Secondary | ICD-10-CM | POA: Diagnosis not present

## 2016-09-14 DIAGNOSIS — E1129 Type 2 diabetes mellitus with other diabetic kidney complication: Secondary | ICD-10-CM | POA: Diagnosis not present

## 2016-09-14 DIAGNOSIS — N186 End stage renal disease: Secondary | ICD-10-CM | POA: Diagnosis not present

## 2016-09-14 DIAGNOSIS — D509 Iron deficiency anemia, unspecified: Secondary | ICD-10-CM | POA: Diagnosis not present

## 2016-09-16 DIAGNOSIS — D509 Iron deficiency anemia, unspecified: Secondary | ICD-10-CM | POA: Diagnosis not present

## 2016-09-16 DIAGNOSIS — D631 Anemia in chronic kidney disease: Secondary | ICD-10-CM | POA: Diagnosis not present

## 2016-09-16 DIAGNOSIS — N2581 Secondary hyperparathyroidism of renal origin: Secondary | ICD-10-CM | POA: Diagnosis not present

## 2016-09-16 DIAGNOSIS — N186 End stage renal disease: Secondary | ICD-10-CM | POA: Diagnosis not present

## 2016-09-16 DIAGNOSIS — E1129 Type 2 diabetes mellitus with other diabetic kidney complication: Secondary | ICD-10-CM | POA: Diagnosis not present

## 2016-09-18 ENCOUNTER — Encounter: Payer: Self-pay | Admitting: Vascular Surgery

## 2016-09-18 ENCOUNTER — Ambulatory Visit (INDEPENDENT_AMBULATORY_CARE_PROVIDER_SITE_OTHER): Payer: Medicare Other | Admitting: Vascular Surgery

## 2016-09-18 VITALS — BP 132/75 | HR 104 | Temp 98.7°F | Resp 18 | Ht 71.0 in | Wt 214.0 lb

## 2016-09-18 DIAGNOSIS — N186 End stage renal disease: Secondary | ICD-10-CM | POA: Diagnosis not present

## 2016-09-18 DIAGNOSIS — E1129 Type 2 diabetes mellitus with other diabetic kidney complication: Secondary | ICD-10-CM | POA: Diagnosis not present

## 2016-09-18 DIAGNOSIS — D631 Anemia in chronic kidney disease: Secondary | ICD-10-CM | POA: Diagnosis not present

## 2016-09-18 DIAGNOSIS — N2581 Secondary hyperparathyroidism of renal origin: Secondary | ICD-10-CM | POA: Diagnosis not present

## 2016-09-18 DIAGNOSIS — D509 Iron deficiency anemia, unspecified: Secondary | ICD-10-CM | POA: Diagnosis not present

## 2016-09-18 NOTE — Progress Notes (Signed)
Patient ID: [name], male   DOB: [birth year], 66 y.o.   MRN: [id number]  Reason for Consult: Chronic Kidney Disease   Referred by Mauricia Area, MD  Subjective:     HPI:  [name] is a 66 y.o. male on dialysis via right IJ catheter. I had placed a right brachiocephalic AV fistula back in February following failure of a right radiocephalic AV fistula. He has been unable to use this fistula given its significant tortuosity. This tortuosity was noted on our last dialysis duplex although the vein did appear of adequate size. He is not having any hand symptoms. Currently dialyzes Monday Wednesdays and Fridays. He does not take any blood thinners.  Past Medical History:  Diagnosis Date  . Cancer [facility])    prostate  . Chronic kidney disease    Dialysis M/w/F  . COPD (chronic obstructive pulmonary disease) ([place])   . Diabetes mellitus    Type 2   . GERD (gastroesophageal reflux disease)   . High cholesterol   . Hypertension   . Shortness of breath dyspnea    Family History  Problem Relation Age of Onset  . Heart failure Mother   . CAD Father   . Prostate cancer Neg Hx   . Kidney cancer Neg Hx    Past Surgical History:  Procedure Laterality Date  . AV FISTULA PLACEMENT Left 03/13/2015   Procedure: Left arm basilic vein transposition .;  Surgeon: Elam Dutch, MD;  Location: [place];  Service: Vascular;  Laterality: Left;  . AV FISTULA PLACEMENT Right 02/18/2016   Procedure: RADIOCEPHALIC ARTERIOVENOUS (AV) FISTULA CREATION;  Surgeon: Waynetta Sandy, MD;  Location: [place];  Service: Vascular;  Laterality: Right;  . AV FISTULA PLACEMENT Right 04/02/2016   Procedure: BRACHIOCEPHALIC ARTERIOVENOUS (AV) FISTULA CREATION;  Surgeon: Waynetta Sandy, MD;  Location: [place];  Service: Vascular;  Laterality: Right;  . COLON RESECTION    . KNEE SURGERY    . TOTAL HIP ARTHROPLASTY     bilat  . TOTAL SHOULDER ARTHROPLASTY     right    Short Social History:  Social  History  Substance Use Topics  . Smoking status: Current Some Day Smoker    Types: Cigarettes  . Smokeless tobacco: Never Used     Comment: 2/4 pk per day.   . Alcohol use No    Allergies  Allergen Reactions  . Pravastatin Other (See Comments)    MYALGIAS, MUSCLE PAIN  . Simvastatin Other (See Comments)    MYALGIAS, MUSCLE PAIN    Current Outpatient Prescriptions  Medication Sig Dispense Refill  . albuterol (PROVENTIL HFA;VENTOLIN HFA) 108 (90 Base) MCG/ACT inhaler Inhale 1-2 puffs into the lungs every 6 (six) hours as needed for wheezing. 1 Inhaler 0  . amLODipine (NORVASC) 10 MG tablet Take 10 mg by mouth daily.    Marland Kitchen atorvastatin (LIPITOR) 20 MG tablet Take 10 mg by mouth daily.    . Fluticasone-Umeclidin-Vilant (TRELEGY ELLIPTA) 100-62.5-25 MCG/INH AEPB Inhale 1 puff into the lungs daily. 14 each 2  . furosemide (LASIX) 20 MG tablet Take 1 tablet (20 mg total) by mouth daily. 30 tablet 0  . glipiZIDE (GLUCOTROL) 5 MG tablet     . oxyCODONE-acetaminophen (ROXICET) 5-325 MG tablet Take 1-2 tablets by mouth every 6 (six) hours as needed for moderate pain or severe pain. 6 tablet 0  . tamsulosin (FLOMAX) 0.4 MG CAPS capsule Take 0.4 mg by mouth daily.    Marland Kitchen glipiZIDE (GLUCOTROL)  10 MG tablet Take 1 tablet (10 mg total) by mouth daily before breakfast. (Patient not taking: Reported on 09/18/2016) 30 tablet 0   No current facility-administered medications for this visit.     Review of Systems  Constitutional:  Constitutional negative. Respiratory: Positive for wheezing.  Musculoskeletal: Musculoskeletal negative.  Neurological: Neurological negative. Hematologic: Hematologic/lymphatic negative.  Psychiatric: Psychiatric negative.        Objective:  Objective   Vitals:   09/18/16 0850  BP: 132/75  Pulse: (!) 104  Resp: 18  Temp: 98.7 F (37.1 C)  SpO2: 96%  Weight: 214 lb (97.1 kg)  Height: 5\' 11"  (1.803 m)   Body mass index is 29.85 kg/m.  Physical Exam    Constitutional: He is oriented to person, place, and time. He appears well-developed.  HENT:  Head: Normocephalic.  Eyes: Pupils are equal, round, and reactive to light.  Cardiovascular: Normal rate.   Pulses:      Radial pulses are 2+ on the right side.  Pulmonary/Chest: Effort normal.  Musculoskeletal:  Strong right upper arm thrill Vein is notably tortuous  Neurological: He is alert and oriented to person, place, and time.  Skin: Skin is warm and dry.  Psychiatric: He has a normal mood and affect. His behavior is normal. Judgment and thought content normal.    Data: No new studies today, previous dialysis duplex reviewed with vein tortuosity     Assessment/Plan:     66 year old male here for evaluation of a tortuous vein in his right arm AV fistula. His hand is fine and has a strong palpable pulse of the wrist. Given the significant tortuosity they are unable to use this fistula. I discussed the options including proceeding with fistulogram to plan possible surgical intervention. He is a good understanding and will get him scheduled very soon.     Waynetta Sandy MD [facility]

## 2016-09-21 ENCOUNTER — Other Ambulatory Visit: Payer: Self-pay

## 2016-09-21 DIAGNOSIS — D509 Iron deficiency anemia, unspecified: Secondary | ICD-10-CM | POA: Diagnosis not present

## 2016-09-21 DIAGNOSIS — E1129 Type 2 diabetes mellitus with other diabetic kidney complication: Secondary | ICD-10-CM | POA: Diagnosis not present

## 2016-09-21 DIAGNOSIS — D631 Anemia in chronic kidney disease: Secondary | ICD-10-CM | POA: Diagnosis not present

## 2016-09-21 DIAGNOSIS — N186 End stage renal disease: Secondary | ICD-10-CM | POA: Diagnosis not present

## 2016-09-21 DIAGNOSIS — N2581 Secondary hyperparathyroidism of renal origin: Secondary | ICD-10-CM | POA: Diagnosis not present

## 2016-09-23 DIAGNOSIS — D631 Anemia in chronic kidney disease: Secondary | ICD-10-CM | POA: Diagnosis not present

## 2016-09-23 DIAGNOSIS — E1129 Type 2 diabetes mellitus with other diabetic kidney complication: Secondary | ICD-10-CM | POA: Diagnosis not present

## 2016-09-23 DIAGNOSIS — N2581 Secondary hyperparathyroidism of renal origin: Secondary | ICD-10-CM | POA: Diagnosis not present

## 2016-09-23 DIAGNOSIS — D509 Iron deficiency anemia, unspecified: Secondary | ICD-10-CM | POA: Diagnosis not present

## 2016-09-23 DIAGNOSIS — N186 End stage renal disease: Secondary | ICD-10-CM | POA: Diagnosis not present

## 2016-09-24 ENCOUNTER — Encounter (HOSPITAL_COMMUNITY): Payer: Self-pay | Admitting: Vascular Surgery

## 2016-09-24 ENCOUNTER — Telehealth: Payer: Self-pay | Admitting: Vascular Surgery

## 2016-09-24 ENCOUNTER — Ambulatory Visit (HOSPITAL_COMMUNITY)
Admission: RE | Admit: 2016-09-24 | Discharge: 2016-09-24 | Disposition: A | Discharge status: Home / Self Care | Payer: Medicare Other | Source: Ambulatory Visit | Attending: Vascular Surgery | Admitting: Vascular Surgery

## 2016-09-24 ENCOUNTER — Encounter (HOSPITAL_COMMUNITY)
Admission: RE | Disposition: A | Discharge status: Home / Self Care | Payer: Self-pay | Source: Ambulatory Visit | Attending: Vascular Surgery

## 2016-09-24 DIAGNOSIS — Y832 Surgical operation with anastomosis, bypass or graft as the cause of abnormal reaction of the patient, or of later complication, without mention of misadventure at the time of the procedure: Secondary | ICD-10-CM | POA: Insufficient documentation

## 2016-09-24 DIAGNOSIS — N186 End stage renal disease: Secondary | ICD-10-CM | POA: Diagnosis not present

## 2016-09-24 DIAGNOSIS — E1122 Type 2 diabetes mellitus with diabetic chronic kidney disease: Secondary | ICD-10-CM | POA: Diagnosis not present

## 2016-09-24 DIAGNOSIS — Z96643 Presence of artificial hip joint, bilateral: Secondary | ICD-10-CM | POA: Insufficient documentation

## 2016-09-24 DIAGNOSIS — E7800 Pure hypercholesterolemia, unspecified: Secondary | ICD-10-CM | POA: Diagnosis not present

## 2016-09-24 DIAGNOSIS — Z8249 Family history of ischemic heart disease and other diseases of the circulatory system: Secondary | ICD-10-CM | POA: Diagnosis not present

## 2016-09-24 DIAGNOSIS — Z7951 Long term (current) use of inhaled steroids: Secondary | ICD-10-CM | POA: Insufficient documentation

## 2016-09-24 DIAGNOSIS — I120 Hypertensive chronic kidney disease with stage 5 chronic kidney disease or end stage renal disease: Secondary | ICD-10-CM | POA: Insufficient documentation

## 2016-09-24 DIAGNOSIS — Z992 Dependence on renal dialysis: Secondary | ICD-10-CM | POA: Insufficient documentation

## 2016-09-24 DIAGNOSIS — Z8546 Personal history of malignant neoplasm of prostate: Secondary | ICD-10-CM | POA: Diagnosis not present

## 2016-09-24 DIAGNOSIS — F17210 Nicotine dependence, cigarettes, uncomplicated: Secondary | ICD-10-CM | POA: Insufficient documentation

## 2016-09-24 DIAGNOSIS — Z7983 Long term (current) use of bisphosphonates: Secondary | ICD-10-CM | POA: Diagnosis not present

## 2016-09-24 DIAGNOSIS — J449 Chronic obstructive pulmonary disease, unspecified: Secondary | ICD-10-CM | POA: Insufficient documentation

## 2016-09-24 DIAGNOSIS — T82898A Other specified complication of vascular prosthetic devices, implants and grafts, initial encounter: Secondary | ICD-10-CM | POA: Diagnosis not present

## 2016-09-24 DIAGNOSIS — T82858A Stenosis of vascular prosthetic devices, implants and grafts, initial encounter: Secondary | ICD-10-CM | POA: Diagnosis not present

## 2016-09-24 DIAGNOSIS — Z79899 Other long term (current) drug therapy: Secondary | ICD-10-CM | POA: Diagnosis not present

## 2016-09-24 DIAGNOSIS — Z7984 Long term (current) use of oral hypoglycemic drugs: Secondary | ICD-10-CM | POA: Insufficient documentation

## 2016-09-24 HISTORY — PX: A/V FISTULAGRAM: CATH118298

## 2016-09-24 LAB — POCT I-STAT, CHEM 8
BUN: 27 mg/dL — AB (ref 6–20)
Calcium, Ion: 1.09 mmol/L — ABNORMAL LOW (ref 1.15–1.40)
Chloride: 97 mmol/L — ABNORMAL LOW (ref 101–111)
Creatinine, Ser: 7.6 mg/dL — ABNORMAL HIGH (ref 0.61–1.24)
Glucose, Bld: 94 mg/dL (ref 65–99)
HEMATOCRIT: 40 % (ref 39.0–52.0)
HEMOGLOBIN: 13.6 g/dL (ref 13.0–17.0)
Potassium: 4.1 mmol/L (ref 3.5–5.1)
SODIUM: 136 mmol/L (ref 135–145)
TCO2: 27 mmol/L (ref 0–100)

## 2016-09-24 SURGERY — A/V FISTULAGRAM
Anesthesia: LOCAL

## 2016-09-24 MED ORDER — HYDRALAZINE HCL 20 MG/ML IJ SOLN: 5.0000 mg | INTRAMUSCULAR | Status: DC | PRN

## 2016-09-24 MED ORDER — HEPARIN (PORCINE) IN NACL 2-0.9 UNIT/ML-% IJ SOLN
INTRAMUSCULAR | Status: AC
  Filled 2016-09-24: qty 500

## 2016-09-24 MED ORDER — LABETALOL HCL 5 MG/ML IV SOLN: 10.0000 mg | INTRAVENOUS | Status: DC | PRN

## 2016-09-24 MED ORDER — SODIUM CHLORIDE 0.9% FLUSH: 3.0000 mL | Freq: Two times a day (BID) | INTRAVENOUS | Status: DC

## 2016-09-24 MED ORDER — IODIXANOL 320 MG/ML IV SOLN
INTRAVENOUS | Status: DC | PRN
  Administered 2016-09-24: 25 mL via INTRAVENOUS

## 2016-09-24 MED ORDER — SODIUM CHLORIDE 0.9% FLUSH: 3.0000 mL | INTRAVENOUS | Status: DC | PRN

## 2016-09-24 MED ORDER — HEPARIN (PORCINE) IN NACL 2-0.9 UNIT/ML-% IJ SOLN
INTRAMUSCULAR | Status: AC | PRN
  Administered 2016-09-24: 500 mL

## 2016-09-24 MED ORDER — LIDOCAINE HCL (PF) 1 % IJ SOLN
INTRAMUSCULAR | Status: DC | PRN
  Administered 2016-09-24: 2 mL via INTRADERMAL

## 2016-09-24 MED ORDER — LIDOCAINE HCL (PF) 1 % IJ SOLN
INTRAMUSCULAR | Status: AC
  Filled 2016-09-24: qty 30

## 2016-09-24 MED ORDER — SODIUM CHLORIDE 0.9 % IV SOLN: 250.0000 mL | INTRAVENOUS | Status: DC | PRN

## 2016-09-24 SURGICAL SUPPLY — 14 items
BAG SNAP BAND KOVER 36X36 (MISCELLANEOUS) ×2 IMPLANT
COVER DOME SNAP 22 D (MISCELLANEOUS) ×2 IMPLANT
COVER PRB 48X5XTLSCP FOLD TPE (BAG) ×2 IMPLANT
COVER PROBE 5X48 (BAG) ×2
DEVICE TORQUE .025-.038 (MISCELLANEOUS) ×2 IMPLANT
GUIDEWIRE ANGLED .035X150CM (WIRE) ×2 IMPLANT
KIT MICROINTRODUCER STIFF 5F (SHEATH) ×2 IMPLANT
PROTECTION STATION PRESSURIZED (MISCELLANEOUS) ×2
SHEATH PINNACLE R/O II 6F 4CM (SHEATH) ×2 IMPLANT
STATION PROTECTION PRESSURIZED (MISCELLANEOUS) ×1 IMPLANT
STOPCOCK MORSE 400PSI 3WAY (MISCELLANEOUS) ×2 IMPLANT
TRAY PV CATH (CUSTOM PROCEDURE TRAY) ×2 IMPLANT
TUBING CIL FLEX 10 FLL-RA (TUBING) ×2 IMPLANT
WIRE TORQFLEX AUST .018X40CM (WIRE) ×2 IMPLANT

## 2016-09-24 NOTE — Telephone Encounter (Signed)
-----   Message from Mena Goes, RN sent at 09/24/2016 12:08 PM EDT ----- Regarding: 2-4 weeks    ----- Message ----- From: Conrad Bullhead, MD Sent: 09/24/2016  11:36 AM To: [facility] [address]  [name] [id number] Aug 07, 1950  PROCEDURE: 1.  right brachiocephalic arteriovenous fistula cannulation under ultrasound guidance 2.  right arm shuntogram  Follow-up: Dr. Donzetta Matters in 2-4 weeks

## 2016-09-24 NOTE — H&P (View-Only) (Signed)
Patient ID: [name], male   DOB: 1950-07-14, 66 y.o.   MRN: [id number]  Reason for Consult: Chronic Kidney Disease   Referred by Mauricia Area, MD  Subjective:     HPI:  [name] is a 66 y.o. male on dialysis via right IJ catheter. I had placed a right brachiocephalic AV fistula back in February following failure of a right radiocephalic AV fistula. He has been unable to use this fistula given its significant tortuosity. This tortuosity was noted on our last dialysis duplex although the vein did appear of adequate size. He is not having any hand symptoms. Currently dialyzes Monday Wednesdays and Fridays. He does not take any blood thinners.  Past Medical History:  Diagnosis Date  . Cancer [facility])    prostate  . Chronic kidney disease    Dialysis M/w/F  . COPD (chronic obstructive pulmonary disease) ([place])   . Diabetes mellitus    Type 2   . GERD (gastroesophageal reflux disease)   . High cholesterol   . Hypertension   . Shortness of breath dyspnea    Family History  Problem Relation Age of Onset  . Heart failure Mother   . CAD Father   . Prostate cancer Neg Hx   . Kidney cancer Neg Hx    Past Surgical History:  Procedure Laterality Date  . AV FISTULA PLACEMENT Left 03/13/2015   Procedure: Left arm basilic vein transposition .;  Surgeon: Elam Dutch, MD;  Location: [place];  Service: Vascular;  Laterality: Left;  . AV FISTULA PLACEMENT Right 02/18/2016   Procedure: RADIOCEPHALIC ARTERIOVENOUS (AV) FISTULA CREATION;  Surgeon: Waynetta Sandy, MD;  Location: [place];  Service: Vascular;  Laterality: Right;  . AV FISTULA PLACEMENT Right 04/02/2016   Procedure: BRACHIOCEPHALIC ARTERIOVENOUS (AV) FISTULA CREATION;  Surgeon: Waynetta Sandy, MD;  Location: [place];  Service: Vascular;  Laterality: Right;  . COLON RESECTION    . KNEE SURGERY    . TOTAL HIP ARTHROPLASTY     bilat  . TOTAL SHOULDER ARTHROPLASTY     right    Short Social History:  Social  History  Substance Use Topics  . Smoking status: Current Some Day Smoker    Types: Cigarettes  . Smokeless tobacco: Never Used     Comment: 2/4 pk per day.   . Alcohol use No    Allergies  Allergen Reactions  . Pravastatin Other (See Comments)    MYALGIAS, MUSCLE PAIN  . Simvastatin Other (See Comments)    MYALGIAS, MUSCLE PAIN    Current Outpatient Prescriptions  Medication Sig Dispense Refill  . albuterol (PROVENTIL HFA;VENTOLIN HFA) 108 (90 Base) MCG/ACT inhaler Inhale 1-2 puffs into the lungs every 6 (six) hours as needed for wheezing. 1 Inhaler 0  . amLODipine (NORVASC) 10 MG tablet Take 10 mg by mouth daily.    Marland Kitchen atorvastatin (LIPITOR) 20 MG tablet Take 10 mg by mouth daily.    . Fluticasone-Umeclidin-Vilant (TRELEGY ELLIPTA) 100-62.5-25 MCG/INH AEPB Inhale 1 puff into the lungs daily. 14 each 2  . furosemide (LASIX) 20 MG tablet Take 1 tablet (20 mg total) by mouth daily. 30 tablet 0  . glipiZIDE (GLUCOTROL) 5 MG tablet     . oxyCODONE-acetaminophen (ROXICET) 5-325 MG tablet Take 1-2 tablets by mouth every 6 (six) hours as needed for moderate pain or severe pain. 6 tablet 0  . tamsulosin (FLOMAX) 0.4 MG CAPS capsule Take 0.4 mg by mouth daily.    Marland Kitchen glipiZIDE (GLUCOTROL)  10 MG tablet Take 1 tablet (10 mg total) by mouth daily before breakfast. (Patient not taking: Reported on 09/18/2016) 30 tablet 0   No current facility-administered medications for this visit.     Review of Systems  Constitutional:  Constitutional negative. Respiratory: Positive for wheezing.  Musculoskeletal: Musculoskeletal negative.  Neurological: Neurological negative. Hematologic: Hematologic/lymphatic negative.  Psychiatric: Psychiatric negative.        Objective:  Objective   Vitals:   09/18/16 0850  BP: 132/75  Pulse: (!) 104  Resp: 18  Temp: 98.7 F (37.1 C)  SpO2: 96%  Weight: 214 lb (97.1 kg)  Height: 5\' 11"  (1.803 m)   Body mass index is 29.85 kg/m.  Physical Exam    Constitutional: He is oriented to person, place, and time. He appears well-developed.  HENT:  Head: Normocephalic.  Eyes: Pupils are equal, round, and reactive to light.  Cardiovascular: Normal rate.   Pulses:      Radial pulses are 2+ on the right side.  Pulmonary/Chest: Effort normal.  Musculoskeletal:  Strong right upper arm thrill Vein is notably tortuous  Neurological: He is alert and oriented to person, place, and time.  Skin: Skin is warm and dry.  Psychiatric: He has a normal mood and affect. His behavior is normal. Judgment and thought content normal.    Data: No new studies today, previous dialysis duplex reviewed with vein tortuosity     Assessment/Plan:     66 year old male here for evaluation of a tortuous vein in his right arm AV fistula. His hand is fine and has a strong palpable pulse of the wrist. Given the significant tortuosity they are unable to use this fistula. I discussed the options including proceeding with fistulogram to plan possible surgical intervention. He is a good understanding and will get him scheduled very soon.     Waynetta Sandy MD [facility]

## 2016-09-24 NOTE — Interval H&P Note (Signed)
   History and Physical Update  The patient was interviewed and re-examined.  The patient's previous History and Physical has been reviewed and is unchanged from Dr. Claretha Cooper consult. There is no change in the plan of care: right arm fistulogram.   I discussed with the patient the nature of angiographic procedures, especially the limited patencies of any endovascular intervention.    The patient is aware of that the risks of an angiographic procedure include but are not limited to: bleeding, infection, access site complications, renal failure, embolization, rupture of vessel, dissection, arteriovenous fistula, possible need for emergent surgical intervention, possible need for surgical procedures to treat the patient's pathology, anaphylactic reaction to contrast, and stroke and death.    The patient is aware of the risks and agrees to proceed.   Adele Barthel, MD, FACS [facility] [place] Office: [phone] Pager: [phone]  09/24/2016, 9:16 AM

## 2016-09-24 NOTE — Op Note (Signed)
    OPERATIVE NOTE   PROCEDURE: 1.  right brachiocephalic arteriovenous fistula cannulation under ultrasound guidance 2.  right arm shuntogram  PRE-OPERATIVE DIAGNOSIS: right brachiocephalic arteriovenous fistula with non-maturation   POST-OPERATIVE DIAGNOSIS: same as above   SURGEON: Adele Barthel, MD  ANESTHESIA: local  ESTIMATED BLOOD LOSS: 50 cc  FINDING(S): 1. Patent brachiocephalic arteriovenous fistula with diseased swing segment (50%) and mid-segment stenosis >90% with extensive tortuousity with moderate sized side branch 2. Patent central venous structures with tunneled dialysis catheter   SPECIMEN(S):  None  CONTRAST: 25 cc  INDICATIONS: [name] is a 66 y.o. male who presents with right brachiocephalic arteriovenous fistula with non-maturation.  The patient is scheduled for right arm shuntogram.  The patient is aware the risks include but are not limited to: bleeding, infection, thrombosis of the cannulated access, and possible anaphylactic reaction to the contrast.  The patient is aware of the risks of the procedure and elects to proceed forward.   DESCRIPTION: After full informed written consent was obtained, the patient was brought back to the angiography suite and placed supine upon the angiography table.  The patient was connected to monitoring equipment.  The right upper arm was prepped and draped in the standard fashion for a right arm shuntogram.  Under ultrasound guidance, the right brachiocephalic arteriovenous fistula was cannulated with a micropuncture needle.  The microwire was advanced into the fistula and the needle was exchanged for the a microsheath, which was lodged 2 cm into the access.  The wire was removed and the sheath was connected to the IV extension tubing.  Hand injections were completed to image the access from the cannulation site up the right atrium.  Based on the images, this patient will need: attempt at crossing stenosis.  I placed a  Glidewire through the sheath and tried to cross the stenotic segment.  Unfortunately, the wire would not cross and kicked out when I tried to exchange the sheath.  I held pressure for 5 minutes to the puncture site.  A sterile bandage was applied to the puncture site.  COMPLICATIONS: none  CONDITION: stable   Adele Barthel, MD, [facility] [place] Office: [phone] Pager: [phone]  09/24/2016 11:28 AM

## 2016-09-24 NOTE — Telephone Encounter (Signed)
Unable to LVM on home # for appt 9/14 mailed lttr

## 2016-09-24 NOTE — Discharge Instructions (Signed)

## 2016-09-25 DIAGNOSIS — N186 End stage renal disease: Secondary | ICD-10-CM | POA: Diagnosis not present

## 2016-09-25 DIAGNOSIS — N2581 Secondary hyperparathyroidism of renal origin: Secondary | ICD-10-CM | POA: Diagnosis not present

## 2016-09-25 DIAGNOSIS — E1129 Type 2 diabetes mellitus with other diabetic kidney complication: Secondary | ICD-10-CM | POA: Diagnosis not present

## 2016-09-25 DIAGNOSIS — D631 Anemia in chronic kidney disease: Secondary | ICD-10-CM | POA: Diagnosis not present

## 2016-09-25 DIAGNOSIS — D509 Iron deficiency anemia, unspecified: Secondary | ICD-10-CM | POA: Diagnosis not present

## 2016-09-28 DIAGNOSIS — N2581 Secondary hyperparathyroidism of renal origin: Secondary | ICD-10-CM | POA: Diagnosis not present

## 2016-09-28 DIAGNOSIS — N186 End stage renal disease: Secondary | ICD-10-CM | POA: Diagnosis not present

## 2016-09-28 DIAGNOSIS — D509 Iron deficiency anemia, unspecified: Secondary | ICD-10-CM | POA: Diagnosis not present

## 2016-09-28 DIAGNOSIS — E1129 Type 2 diabetes mellitus with other diabetic kidney complication: Secondary | ICD-10-CM | POA: Diagnosis not present

## 2016-09-28 DIAGNOSIS — D631 Anemia in chronic kidney disease: Secondary | ICD-10-CM | POA: Diagnosis not present

## 2016-09-30 DIAGNOSIS — D631 Anemia in chronic kidney disease: Secondary | ICD-10-CM | POA: Diagnosis not present

## 2016-09-30 DIAGNOSIS — N2581 Secondary hyperparathyroidism of renal origin: Secondary | ICD-10-CM | POA: Diagnosis not present

## 2016-09-30 DIAGNOSIS — D509 Iron deficiency anemia, unspecified: Secondary | ICD-10-CM | POA: Diagnosis not present

## 2016-09-30 DIAGNOSIS — E1129 Type 2 diabetes mellitus with other diabetic kidney complication: Secondary | ICD-10-CM | POA: Diagnosis not present

## 2016-09-30 DIAGNOSIS — N186 End stage renal disease: Secondary | ICD-10-CM | POA: Diagnosis not present

## 2016-10-02 DIAGNOSIS — D509 Iron deficiency anemia, unspecified: Secondary | ICD-10-CM | POA: Diagnosis not present

## 2016-10-02 DIAGNOSIS — N2581 Secondary hyperparathyroidism of renal origin: Secondary | ICD-10-CM | POA: Diagnosis not present

## 2016-10-02 DIAGNOSIS — E1129 Type 2 diabetes mellitus with other diabetic kidney complication: Secondary | ICD-10-CM | POA: Diagnosis not present

## 2016-10-02 DIAGNOSIS — D631 Anemia in chronic kidney disease: Secondary | ICD-10-CM | POA: Diagnosis not present

## 2016-10-02 DIAGNOSIS — N186 End stage renal disease: Secondary | ICD-10-CM | POA: Diagnosis not present

## 2016-10-05 DIAGNOSIS — D509 Iron deficiency anemia, unspecified: Secondary | ICD-10-CM | POA: Diagnosis not present

## 2016-10-05 DIAGNOSIS — N2581 Secondary hyperparathyroidism of renal origin: Secondary | ICD-10-CM | POA: Diagnosis not present

## 2016-10-05 DIAGNOSIS — D631 Anemia in chronic kidney disease: Secondary | ICD-10-CM | POA: Diagnosis not present

## 2016-10-05 DIAGNOSIS — E1129 Type 2 diabetes mellitus with other diabetic kidney complication: Secondary | ICD-10-CM | POA: Diagnosis not present

## 2016-10-05 DIAGNOSIS — N186 End stage renal disease: Secondary | ICD-10-CM | POA: Diagnosis not present

## 2016-10-07 DIAGNOSIS — D509 Iron deficiency anemia, unspecified: Secondary | ICD-10-CM | POA: Diagnosis not present

## 2016-10-07 DIAGNOSIS — N186 End stage renal disease: Secondary | ICD-10-CM | POA: Diagnosis not present

## 2016-10-07 DIAGNOSIS — N2581 Secondary hyperparathyroidism of renal origin: Secondary | ICD-10-CM | POA: Diagnosis not present

## 2016-10-07 DIAGNOSIS — E1129 Type 2 diabetes mellitus with other diabetic kidney complication: Secondary | ICD-10-CM | POA: Diagnosis not present

## 2016-10-07 DIAGNOSIS — D631 Anemia in chronic kidney disease: Secondary | ICD-10-CM | POA: Diagnosis not present

## 2016-10-09 DIAGNOSIS — D509 Iron deficiency anemia, unspecified: Secondary | ICD-10-CM | POA: Diagnosis not present

## 2016-10-09 DIAGNOSIS — Z992 Dependence on renal dialysis: Secondary | ICD-10-CM | POA: Diagnosis not present

## 2016-10-09 DIAGNOSIS — N186 End stage renal disease: Secondary | ICD-10-CM | POA: Diagnosis not present

## 2016-10-09 DIAGNOSIS — E1129 Type 2 diabetes mellitus with other diabetic kidney complication: Secondary | ICD-10-CM | POA: Diagnosis not present

## 2016-10-09 DIAGNOSIS — N2581 Secondary hyperparathyroidism of renal origin: Secondary | ICD-10-CM | POA: Diagnosis not present

## 2016-10-09 DIAGNOSIS — D631 Anemia in chronic kidney disease: Secondary | ICD-10-CM | POA: Diagnosis not present

## 2016-10-09 DIAGNOSIS — E1122 Type 2 diabetes mellitus with diabetic chronic kidney disease: Secondary | ICD-10-CM | POA: Diagnosis not present

## 2016-10-12 DIAGNOSIS — D631 Anemia in chronic kidney disease: Secondary | ICD-10-CM | POA: Diagnosis not present

## 2016-10-12 DIAGNOSIS — N186 End stage renal disease: Secondary | ICD-10-CM | POA: Diagnosis not present

## 2016-10-12 DIAGNOSIS — D509 Iron deficiency anemia, unspecified: Secondary | ICD-10-CM | POA: Diagnosis not present

## 2016-10-12 DIAGNOSIS — N2581 Secondary hyperparathyroidism of renal origin: Secondary | ICD-10-CM | POA: Diagnosis not present

## 2016-10-12 DIAGNOSIS — Z23 Encounter for immunization: Secondary | ICD-10-CM | POA: Diagnosis not present

## 2016-10-12 DIAGNOSIS — E1129 Type 2 diabetes mellitus with other diabetic kidney complication: Secondary | ICD-10-CM | POA: Diagnosis not present

## 2016-10-14 DIAGNOSIS — D631 Anemia in chronic kidney disease: Secondary | ICD-10-CM | POA: Diagnosis not present

## 2016-10-14 DIAGNOSIS — Z23 Encounter for immunization: Secondary | ICD-10-CM | POA: Diagnosis not present

## 2016-10-14 DIAGNOSIS — N2581 Secondary hyperparathyroidism of renal origin: Secondary | ICD-10-CM | POA: Diagnosis not present

## 2016-10-14 DIAGNOSIS — N186 End stage renal disease: Secondary | ICD-10-CM | POA: Diagnosis not present

## 2016-10-14 DIAGNOSIS — E1129 Type 2 diabetes mellitus with other diabetic kidney complication: Secondary | ICD-10-CM | POA: Diagnosis not present

## 2016-10-14 DIAGNOSIS — D509 Iron deficiency anemia, unspecified: Secondary | ICD-10-CM | POA: Diagnosis not present

## 2016-10-19 DIAGNOSIS — N2581 Secondary hyperparathyroidism of renal origin: Secondary | ICD-10-CM | POA: Diagnosis not present

## 2016-10-19 DIAGNOSIS — D509 Iron deficiency anemia, unspecified: Secondary | ICD-10-CM | POA: Diagnosis not present

## 2016-10-19 DIAGNOSIS — Z23 Encounter for immunization: Secondary | ICD-10-CM | POA: Diagnosis not present

## 2016-10-19 DIAGNOSIS — D631 Anemia in chronic kidney disease: Secondary | ICD-10-CM | POA: Diagnosis not present

## 2016-10-19 DIAGNOSIS — N186 End stage renal disease: Secondary | ICD-10-CM | POA: Diagnosis not present

## 2016-10-19 DIAGNOSIS — E1129 Type 2 diabetes mellitus with other diabetic kidney complication: Secondary | ICD-10-CM | POA: Diagnosis not present

## 2016-10-21 DIAGNOSIS — D631 Anemia in chronic kidney disease: Secondary | ICD-10-CM | POA: Diagnosis not present

## 2016-10-21 DIAGNOSIS — E1129 Type 2 diabetes mellitus with other diabetic kidney complication: Secondary | ICD-10-CM | POA: Diagnosis not present

## 2016-10-21 DIAGNOSIS — D509 Iron deficiency anemia, unspecified: Secondary | ICD-10-CM | POA: Diagnosis not present

## 2016-10-21 DIAGNOSIS — Z23 Encounter for immunization: Secondary | ICD-10-CM | POA: Diagnosis not present

## 2016-10-21 DIAGNOSIS — N186 End stage renal disease: Secondary | ICD-10-CM | POA: Diagnosis not present

## 2016-10-21 DIAGNOSIS — N2581 Secondary hyperparathyroidism of renal origin: Secondary | ICD-10-CM | POA: Diagnosis not present

## 2016-10-23 ENCOUNTER — Encounter: Payer: Self-pay | Admitting: Vascular Surgery

## 2016-10-23 ENCOUNTER — Ambulatory Visit (INDEPENDENT_AMBULATORY_CARE_PROVIDER_SITE_OTHER): Payer: Medicare Other | Admitting: Vascular Surgery

## 2016-10-23 ENCOUNTER — Other Ambulatory Visit: Payer: Self-pay

## 2016-10-23 VITALS — BP 152/82 | HR 86 | Temp 98.0°F | Resp 18 | Ht 71.0 in | Wt 219.0 lb

## 2016-10-23 DIAGNOSIS — Z23 Encounter for immunization: Secondary | ICD-10-CM | POA: Diagnosis not present

## 2016-10-23 DIAGNOSIS — D509 Iron deficiency anemia, unspecified: Secondary | ICD-10-CM | POA: Diagnosis not present

## 2016-10-23 DIAGNOSIS — D631 Anemia in chronic kidney disease: Secondary | ICD-10-CM | POA: Diagnosis not present

## 2016-10-23 DIAGNOSIS — N186 End stage renal disease: Secondary | ICD-10-CM | POA: Diagnosis not present

## 2016-10-23 DIAGNOSIS — N2581 Secondary hyperparathyroidism of renal origin: Secondary | ICD-10-CM | POA: Diagnosis not present

## 2016-10-23 DIAGNOSIS — E1129 Type 2 diabetes mellitus with other diabetic kidney complication: Secondary | ICD-10-CM | POA: Diagnosis not present

## 2016-10-23 NOTE — Progress Notes (Signed)
Patient ID: [name], male   DOB: 04/12/1950, 66 y.o.   MRN: [id number]  Reason for Consult: Routine Post Op   Referred by [facility]  Subjective:     HPI:  [name] is a 66 y.o. male with end-stage renal disease on dialysis Monday Wednesday Friday via right IJ catheter. He has a right brachial cephalic AV fistula which is failed to mature and is noted to be very tortuous. Recently underwent discogram by Dr. Bridgett Larsson which demonstrated midsegment stenosis as well as tortuosity. He is now here for follow-up to consider possible further intervention.  Past Medical History:  Diagnosis Date  . Cancer [facility])    prostate  . Chronic kidney disease    Dialysis M/w/F  . COPD (chronic obstructive pulmonary disease) ([place])   . Diabetes mellitus    Type 2   . GERD (gastroesophageal reflux disease)   . High cholesterol   . Hypertension   . Shortness of breath dyspnea    Family History  Problem Relation Age of Onset  . Heart failure Mother   . CAD Father   . Prostate cancer Neg Hx   . Kidney cancer Neg Hx    Past Surgical History:  Procedure Laterality Date  . A/V FISTULAGRAM N/A 09/24/2016   Procedure: A/V Fistulagram - Right Arm;  Surgeon: Conrad Harahan, MD;  Location: [place] [facility];  Service: Cardiovascular;  Laterality: N/A;  . AV FISTULA PLACEMENT Left 03/13/2015   Procedure: Left arm basilic vein transposition .;  Surgeon: Elam Dutch, MD;  Location: [place];  Service: Vascular;  Laterality: Left;  . AV FISTULA PLACEMENT Right 02/18/2016   Procedure: RADIOCEPHALIC ARTERIOVENOUS (AV) FISTULA CREATION;  Surgeon: Waynetta Sandy, MD;  Location: [place];  Service: Vascular;  Laterality: Right;  . AV FISTULA PLACEMENT Right 04/02/2016   Procedure: BRACHIOCEPHALIC ARTERIOVENOUS (AV) FISTULA CREATION;  Surgeon: Waynetta Sandy, MD;  Location: [place];  Service: Vascular;  Laterality: Right;  . COLON RESECTION    . KNEE SURGERY    . TOTAL HIP ARTHROPLASTY      bilat  . TOTAL SHOULDER ARTHROPLASTY     right    Short Social History:  Social History  Substance Use Topics  . Smoking status: Current Some Day Smoker    Types: Cigarettes  . Smokeless tobacco: Never Used     Comment: 2/4 pk per day.   . Alcohol use No    Allergies  Allergen Reactions  . Pravastatin Other (See Comments)    MYALGIAS, MUSCLE PAIN  . Simvastatin Other (See Comments)    MYALGIAS, MUSCLE PAIN    Current Outpatient Prescriptions  Medication Sig Dispense Refill  . albuterol (PROVENTIL HFA;VENTOLIN HFA) 108 (90 Base) MCG/ACT inhaler Inhale 1-2 puffs into the lungs every 6 (six) hours as needed for wheezing. 1 Inhaler 0  . atorvastatin (LIPITOR) 20 MG tablet Take 10 mg by mouth at bedtime.     . furosemide (LASIX) 20 MG tablet Take 1 tablet (20 mg total) by mouth daily. 30 tablet 0  . glipiZIDE (GLUCOTROL) 10 MG tablet Take 1 tablet (10 mg total) by mouth daily before breakfast. (Patient taking differently: Take 5 mg by mouth daily before breakfast. ) 30 tablet 0  . HYDROcodone-acetaminophen (NORCO/VICODIN) 5-325 MG tablet Take 1-2 tablets by mouth daily as needed for moderate pain.    . sucroferric oxyhydroxide (VELPHORO) 500 MG chewable tablet Chew 1,000 mg by mouth 2 (two) times daily  with a meal.    . tamsulosin (FLOMAX) 0.4 MG CAPS capsule Take 0.4 mg by mouth daily.    Marland Kitchen glipiZIDE (GLUCOTROL) 5 MG tablet      No current facility-administered medications for this visit.     Review of Systems  Constitutional:  Constitutional negative. HENT: HENT negative.  Eyes: Eyes negative.  Respiratory: Respiratory negative.  Cardiovascular: Cardiovascular negative.  GI: Gastrointestinal negative.  Musculoskeletal: Musculoskeletal negative.  Skin: Skin negative.  Neurological: Neurological negative. Hematologic: Hematologic/lymphatic negative.  Psychiatric: Psychiatric negative.        Objective:  Objective   Vitals:   10/23/16 1031 10/23/16 1033  BP: (!)  159/78 (!) 152/82  Pulse: 86 86  Resp: 18   Temp: 98 F (36.7 C)   SpO2: 97%   Weight: 219 lb (99.3 kg)   Height: 5\' 11"  (1.803 m)    Body mass index is 30.54 kg/m.  Physical Exam  Constitutional: He is oriented to person, place, and time. He appears well-developed.  Eyes: Pupils are equal, round, and reactive to light.  Neck: Normal range of motion.  Cardiovascular: Normal rate.   Pulses:      Radial pulses are 2+ on the right side.  Right arm avf with palpable thrill  Pulmonary/Chest: Effort normal.  Abdominal: Soft.  Musculoskeletal: Normal range of motion. He exhibits no edema or deformity.  Neurological: He is alert and oriented to person, place, and time.  Skin: Skin is warm and dry.  Psychiatric: He has a normal mood and affect. His behavior is normal. Judgment and thought content normal.    Data: Reviewed his cystogram performed by Dr. Bridgett Larsson which shows midsegment stenosis as well as tortuosity.    Assessment/Plan:     66 year old male follows up for evaluation right arm fistula recent cystogram by Dr. Bridgett Larsson. I discussed with him again this fistula and attempted to create another area of access versus attempting to open revise this one. This time we will plan to proceed with open revision superficial position of this AV fistula on a nondialysis day in the near future. We have discussed risks benefits and alternatives to proceeding in this risk and understanding and we'll get him scheduled today.     Waynetta Sandy MD [facility]

## 2016-10-23 NOTE — Progress Notes (Signed)
Vitals:   10/23/16 1031  BP: (!) 159/78  Pulse: 86  Resp: 18  Temp: 98 F (36.7 C)  SpO2: 97%  Weight: 219 lb (99.3 kg)  Height: 5\' 11"  (1.803 m)

## 2016-10-26 ENCOUNTER — Telehealth: Payer: Self-pay | Admitting: Vascular Surgery

## 2016-10-26 DIAGNOSIS — N2581 Secondary hyperparathyroidism of renal origin: Secondary | ICD-10-CM | POA: Diagnosis not present

## 2016-10-26 DIAGNOSIS — D631 Anemia in chronic kidney disease: Secondary | ICD-10-CM | POA: Diagnosis not present

## 2016-10-26 DIAGNOSIS — Z23 Encounter for immunization: Secondary | ICD-10-CM | POA: Diagnosis not present

## 2016-10-26 DIAGNOSIS — N186 End stage renal disease: Secondary | ICD-10-CM | POA: Diagnosis not present

## 2016-10-26 DIAGNOSIS — E1129 Type 2 diabetes mellitus with other diabetic kidney complication: Secondary | ICD-10-CM | POA: Diagnosis not present

## 2016-10-26 DIAGNOSIS — D509 Iron deficiency anemia, unspecified: Secondary | ICD-10-CM | POA: Diagnosis not present

## 2016-10-26 NOTE — Telephone Encounter (Addendum)
I spoke to [place] at [facility] to advise [name] that his procedure arrival time has changed from 9:30 am to 05:30 am.  (the patient is dialyzing there today).  [name] will advise the patient of the new changes.    Thurston Hole., LPN

## 2016-10-27 ENCOUNTER — Telehealth: Payer: Self-pay | Admitting: Vascular Surgery

## 2016-10-27 NOTE — Telephone Encounter (Signed)
Patient agreed to move procedure to 11/05/16 with a 1:00 pm arrival time. He will contact the office if this date no longer works for him.  Thurston Hole., LPN

## 2016-10-27 NOTE — Telephone Encounter (Addendum)
I called patient's cell phone to advise him that he will either need to keep the 5:30am arrival time for his procedure or we will need to reschedule.  Unable to leave voice message so called his daughter Beckie Busing [phone]) and left voice message on her home phone. Thurston Hole., LPN

## 2016-10-28 DIAGNOSIS — Z23 Encounter for immunization: Secondary | ICD-10-CM | POA: Diagnosis not present

## 2016-10-28 DIAGNOSIS — D631 Anemia in chronic kidney disease: Secondary | ICD-10-CM | POA: Diagnosis not present

## 2016-10-28 DIAGNOSIS — E1129 Type 2 diabetes mellitus with other diabetic kidney complication: Secondary | ICD-10-CM | POA: Diagnosis not present

## 2016-10-28 DIAGNOSIS — N186 End stage renal disease: Secondary | ICD-10-CM | POA: Diagnosis not present

## 2016-10-28 DIAGNOSIS — N2581 Secondary hyperparathyroidism of renal origin: Secondary | ICD-10-CM | POA: Diagnosis not present

## 2016-10-28 DIAGNOSIS — D509 Iron deficiency anemia, unspecified: Secondary | ICD-10-CM | POA: Diagnosis not present

## 2016-10-30 DIAGNOSIS — Z23 Encounter for immunization: Secondary | ICD-10-CM | POA: Diagnosis not present

## 2016-10-30 DIAGNOSIS — N186 End stage renal disease: Secondary | ICD-10-CM | POA: Diagnosis not present

## 2016-10-30 DIAGNOSIS — D631 Anemia in chronic kidney disease: Secondary | ICD-10-CM | POA: Diagnosis not present

## 2016-10-30 DIAGNOSIS — E1129 Type 2 diabetes mellitus with other diabetic kidney complication: Secondary | ICD-10-CM | POA: Diagnosis not present

## 2016-10-30 DIAGNOSIS — D509 Iron deficiency anemia, unspecified: Secondary | ICD-10-CM | POA: Diagnosis not present

## 2016-10-30 DIAGNOSIS — N2581 Secondary hyperparathyroidism of renal origin: Secondary | ICD-10-CM | POA: Diagnosis not present

## 2016-11-02 ENCOUNTER — Telehealth: Payer: Self-pay | Admitting: *Deleted

## 2016-11-02 DIAGNOSIS — D631 Anemia in chronic kidney disease: Secondary | ICD-10-CM | POA: Diagnosis not present

## 2016-11-02 DIAGNOSIS — N2581 Secondary hyperparathyroidism of renal origin: Secondary | ICD-10-CM | POA: Diagnosis not present

## 2016-11-02 DIAGNOSIS — E1129 Type 2 diabetes mellitus with other diabetic kidney complication: Secondary | ICD-10-CM | POA: Diagnosis not present

## 2016-11-02 DIAGNOSIS — Z23 Encounter for immunization: Secondary | ICD-10-CM | POA: Diagnosis not present

## 2016-11-02 DIAGNOSIS — N186 End stage renal disease: Secondary | ICD-10-CM | POA: Diagnosis not present

## 2016-11-02 DIAGNOSIS — D509 Iron deficiency anemia, unspecified: Secondary | ICD-10-CM | POA: Diagnosis not present

## 2016-11-02 NOTE — Telephone Encounter (Signed)
OR time moved to 11:00am for 11/05/16 patient to arrive to admitting at 8:30 am. Patient contacted and informed of this time change. He verbalized his understanding of this time change.

## 2016-11-03 ENCOUNTER — Encounter (HOSPITAL_COMMUNITY): Payer: Self-pay | Admitting: *Deleted

## 2016-11-03 NOTE — Progress Notes (Signed)
[name] reports that he checks CBG 2 times a day, it runs 57' s - 120's I instructed patient to check CBG after awaking and every 2 hours until arrival  to the hospital.  I Instructed patient if CBG is less than 70 to take  1/2 cup of a clear juice. Recheck CBG in 15 minutes then call pre- op desk at [phone] for further instructions.  I instructed patient hold Glipizide the morning of surgery.  I instructed patient to not take Naproxen until after surgery, if instructed to  Do so by MD and to not spoke within 24 hours of surgery.

## 2016-11-04 DIAGNOSIS — D631 Anemia in chronic kidney disease: Secondary | ICD-10-CM | POA: Diagnosis not present

## 2016-11-04 DIAGNOSIS — Z23 Encounter for immunization: Secondary | ICD-10-CM | POA: Diagnosis not present

## 2016-11-04 DIAGNOSIS — N2581 Secondary hyperparathyroidism of renal origin: Secondary | ICD-10-CM | POA: Diagnosis not present

## 2016-11-04 DIAGNOSIS — E1129 Type 2 diabetes mellitus with other diabetic kidney complication: Secondary | ICD-10-CM | POA: Diagnosis not present

## 2016-11-04 DIAGNOSIS — D509 Iron deficiency anemia, unspecified: Secondary | ICD-10-CM | POA: Diagnosis not present

## 2016-11-04 DIAGNOSIS — N186 End stage renal disease: Secondary | ICD-10-CM | POA: Diagnosis not present

## 2016-11-05 ENCOUNTER — Encounter (HOSPITAL_COMMUNITY)
Admission: RE | Disposition: A | Discharge status: Home / Self Care | Payer: Self-pay | Source: Ambulatory Visit | Attending: Vascular Surgery

## 2016-11-05 ENCOUNTER — Ambulatory Visit (HOSPITAL_COMMUNITY)
Admission: RE | Admit: 2016-11-05 | Discharge: 2016-11-05 | Disposition: A | Discharge status: Home / Self Care | Payer: Medicare Other | Source: Ambulatory Visit | Attending: Vascular Surgery | Admitting: Vascular Surgery

## 2016-11-05 ENCOUNTER — Other Ambulatory Visit: Payer: Self-pay

## 2016-11-05 ENCOUNTER — Encounter (HOSPITAL_COMMUNITY): Payer: Self-pay | Admitting: Certified Registered Nurse Anesthetist

## 2016-11-05 ENCOUNTER — Ambulatory Visit (HOSPITAL_COMMUNITY): Payer: Medicare Other | Admitting: Certified Registered Nurse Anesthetist

## 2016-11-05 DIAGNOSIS — Z7984 Long term (current) use of oral hypoglycemic drugs: Secondary | ICD-10-CM | POA: Diagnosis not present

## 2016-11-05 DIAGNOSIS — Z79899 Other long term (current) drug therapy: Secondary | ICD-10-CM | POA: Diagnosis not present

## 2016-11-05 DIAGNOSIS — N186 End stage renal disease: Secondary | ICD-10-CM | POA: Diagnosis not present

## 2016-11-05 DIAGNOSIS — T82591A Other mechanical complication of surgically created arteriovenous shunt, initial encounter: Secondary | ICD-10-CM | POA: Diagnosis not present

## 2016-11-05 DIAGNOSIS — F17210 Nicotine dependence, cigarettes, uncomplicated: Secondary | ICD-10-CM | POA: Insufficient documentation

## 2016-11-05 DIAGNOSIS — I120 Hypertensive chronic kidney disease with stage 5 chronic kidney disease or end stage renal disease: Secondary | ICD-10-CM | POA: Diagnosis not present

## 2016-11-05 DIAGNOSIS — Z48812 Encounter for surgical aftercare following surgery on the circulatory system: Secondary | ICD-10-CM

## 2016-11-05 DIAGNOSIS — Z888 Allergy status to other drugs, medicaments and biological substances status: Secondary | ICD-10-CM | POA: Diagnosis not present

## 2016-11-05 DIAGNOSIS — Y828 Other medical devices associated with adverse incidents: Secondary | ICD-10-CM | POA: Diagnosis not present

## 2016-11-05 DIAGNOSIS — J449 Chronic obstructive pulmonary disease, unspecified: Secondary | ICD-10-CM | POA: Diagnosis not present

## 2016-11-05 DIAGNOSIS — Z96643 Presence of artificial hip joint, bilateral: Secondary | ICD-10-CM | POA: Insufficient documentation

## 2016-11-05 DIAGNOSIS — K219 Gastro-esophageal reflux disease without esophagitis: Secondary | ICD-10-CM | POA: Insufficient documentation

## 2016-11-05 DIAGNOSIS — Z8249 Family history of ischemic heart disease and other diseases of the circulatory system: Secondary | ICD-10-CM | POA: Diagnosis not present

## 2016-11-05 DIAGNOSIS — E7800 Pure hypercholesterolemia, unspecified: Secondary | ICD-10-CM | POA: Diagnosis not present

## 2016-11-05 DIAGNOSIS — Z992 Dependence on renal dialysis: Secondary | ICD-10-CM | POA: Insufficient documentation

## 2016-11-05 DIAGNOSIS — Z8546 Personal history of malignant neoplasm of prostate: Secondary | ICD-10-CM | POA: Insufficient documentation

## 2016-11-05 DIAGNOSIS — E1122 Type 2 diabetes mellitus with diabetic chronic kidney disease: Secondary | ICD-10-CM | POA: Diagnosis not present

## 2016-11-05 DIAGNOSIS — Z96611 Presence of right artificial shoulder joint: Secondary | ICD-10-CM | POA: Diagnosis not present

## 2016-11-05 DIAGNOSIS — N486 Induration penis plastica: Secondary | ICD-10-CM | POA: Diagnosis not present

## 2016-11-05 DIAGNOSIS — T82898A Other specified complication of vascular prosthetic devices, implants and grafts, initial encounter: Secondary | ICD-10-CM | POA: Diagnosis not present

## 2016-11-05 HISTORY — PX: REVISON OF ARTERIOVENOUS FISTULA: SHX6074

## 2016-11-05 HISTORY — DX: Unspecified osteoarthritis, unspecified site: M19.90

## 2016-11-05 LAB — GLUCOSE, CAPILLARY: GLUCOSE-CAPILLARY: 141 mg/dL — AB (ref 65–99)

## 2016-11-05 LAB — POCT I-STAT 4, (NA,K, GLUC, HGB,HCT)
GLUCOSE: 125 mg/dL — AB (ref 65–99)
HCT: 34 % — ABNORMAL LOW (ref 39.0–52.0)
Hemoglobin: 11.6 g/dL — ABNORMAL LOW (ref 13.0–17.0)
POTASSIUM: 4.2 mmol/L (ref 3.5–5.1)
SODIUM: 134 mmol/L — AB (ref 135–145)

## 2016-11-05 SURGERY — REVISON OF ARTERIOVENOUS FISTULA
Anesthesia: Monitor Anesthesia Care | Site: Arm Upper | Laterality: Right

## 2016-11-05 MED ORDER — GLIPIZIDE 10 MG PO TABS: 5.0000 mg | ORAL_TABLET | Freq: Every day | ORAL | Status: DC

## 2016-11-05 MED ORDER — SODIUM CHLORIDE 0.9 % IV SOLN
INTRAVENOUS | Status: DC | PRN
  Administered 2016-11-05: 11:00:00 500 mL

## 2016-11-05 MED ORDER — LIDOCAINE HCL (CARDIAC) 20 MG/ML IV SOLN
INTRAVENOUS | Status: DC | PRN
  Administered 2016-11-05: 100 mg via INTRATRACHEAL

## 2016-11-05 MED ORDER — MIDAZOLAM HCL 2 MG/2ML IJ SOLN
INTRAMUSCULAR | Status: AC
  Filled 2016-11-05: qty 2

## 2016-11-05 MED ORDER — MIDAZOLAM HCL 5 MG/5ML IJ SOLN
INTRAMUSCULAR | Status: DC | PRN
  Administered 2016-11-05: 2 mg via INTRAVENOUS

## 2016-11-05 MED ORDER — LIDOCAINE-EPINEPHRINE (PF) 1 %-1:200000 IJ SOLN
INTRAMUSCULAR | Status: AC
  Filled 2016-11-05: qty 30

## 2016-11-05 MED ORDER — 0.9 % SODIUM CHLORIDE (POUR BTL) OPTIME
TOPICAL | Status: DC | PRN
  Administered 2016-11-05: 1000 mL

## 2016-11-05 MED ORDER — PHENYLEPHRINE HCL 10 MG/ML IJ SOLN
INTRAMUSCULAR | Status: DC | PRN
  Administered 2016-11-05: 80 ug via INTRAVENOUS
  Administered 2016-11-05 (×2): 120 ug via INTRAVENOUS
  Administered 2016-11-05: 80 ug via INTRAVENOUS

## 2016-11-05 MED ORDER — HYDROCODONE-ACETAMINOPHEN 5-325 MG PO TABS: 1.0000 | ORAL_TABLET | Freq: Four times a day (QID) | ORAL | 0 refills | Status: DC | PRN

## 2016-11-05 MED ORDER — DEXTROSE 5 % IV SOLN
1.5000 g | INTRAVENOUS | Status: AC
  Administered 2016-11-05: 1.5 g via INTRAVENOUS
  Filled 2016-11-05: qty 1.5

## 2016-11-05 MED ORDER — PHENYLEPHRINE HCL 10 MG/ML IJ SOLN
INTRAVENOUS | Status: DC | PRN
  Administered 2016-11-05: 40 ug/min via INTRAVENOUS

## 2016-11-05 MED ORDER — CHLORHEXIDINE GLUCONATE CLOTH 2 % EX PADS: 6.0000 | MEDICATED_PAD | Freq: Once | CUTANEOUS | Status: DC

## 2016-11-05 MED ORDER — HEMOSTATIC AGENTS (NO CHARGE) OPTIME
TOPICAL | Status: DC | PRN
  Administered 2016-11-05: 1 via TOPICAL

## 2016-11-05 MED ORDER — PROPOFOL 500 MG/50ML IV EMUL
INTRAVENOUS | Status: DC | PRN
  Administered 2016-11-05: 100 ug/kg/min via INTRAVENOUS
  Administered 2016-11-05: 12:00:00 via INTRAVENOUS

## 2016-11-05 MED ORDER — FENTANYL CITRATE (PF) 100 MCG/2ML IJ SOLN: 25.0000 ug | INTRAMUSCULAR | Status: DC | PRN

## 2016-11-05 MED ORDER — SODIUM CHLORIDE 0.9 % IV SOLN
INTRAVENOUS | Status: DC
  Administered 2016-11-05: 10 mL/h via INTRAVENOUS
  Administered 2016-11-05: 12:00:00 via INTRAVENOUS

## 2016-11-05 MED ORDER — LIDOCAINE-EPINEPHRINE (PF) 1 %-1:200000 IJ SOLN
INTRAMUSCULAR | Status: DC | PRN
  Administered 2016-11-05: 20 mL

## 2016-11-05 MED ORDER — PROPOFOL 10 MG/ML IV BOLUS
INTRAVENOUS | Status: DC | PRN
  Administered 2016-11-05: 50 mg via INTRAVENOUS

## 2016-11-05 SURGICAL SUPPLY — 30 items
ARMBAND PINK RESTRICT EXTREMIT (MISCELLANEOUS) ×3 IMPLANT
CANISTER SUCT 3000ML PPV (MISCELLANEOUS) ×3 IMPLANT
CATH EMB 3FR 80CM (CATHETERS) ×3 IMPLANT
CLIP VESOCCLUDE MED 6/CT (CLIP) ×3 IMPLANT
CLIP VESOCCLUDE SM WIDE 6/CT (CLIP) ×3 IMPLANT
COVER PROBE W GEL 5X96 (DRAPES) IMPLANT
DERMABOND ADVANCED (GAUZE/BANDAGES/DRESSINGS) ×2
DERMABOND ADVANCED .7 DNX12 (GAUZE/BANDAGES/DRESSINGS) ×1 IMPLANT
ELECT REM PT RETURN 9FT ADLT (ELECTROSURGICAL) ×3
ELECTRODE REM PT RTRN 9FT ADLT (ELECTROSURGICAL) ×1 IMPLANT
GLOVE BIO SURGEON STRL SZ7.5 (GLOVE) ×3 IMPLANT
GLOVE BIOGEL PI IND STRL 6.5 (GLOVE) ×2 IMPLANT
GLOVE BIOGEL PI INDICATOR 6.5 (GLOVE) ×4
GLOVE SURG SS PI 7.0 STRL IVOR (GLOVE) ×9 IMPLANT
GOWN STRL REUS W/ TWL LRG LVL3 (GOWN DISPOSABLE) ×2 IMPLANT
GOWN STRL REUS W/ TWL XL LVL3 (GOWN DISPOSABLE) ×2 IMPLANT
GOWN STRL REUS W/TWL LRG LVL3 (GOWN DISPOSABLE) ×4
GOWN STRL REUS W/TWL XL LVL3 (GOWN DISPOSABLE) ×4
HEMOSTAT SNOW SURGICEL 2X4 (HEMOSTASIS) ×3 IMPLANT
KIT BASIN OR (CUSTOM PROCEDURE TRAY) ×3 IMPLANT
KIT ROOM TURNOVER OR (KITS) ×3 IMPLANT
NS IRRIG 1000ML POUR BTL (IV SOLUTION) ×3 IMPLANT
PACK CV ACCESS (CUSTOM PROCEDURE TRAY) ×3 IMPLANT
PAD ARMBOARD 7.5X6 YLW CONV (MISCELLANEOUS) ×6 IMPLANT
SUT MNCRL AB 4-0 PS2 18 (SUTURE) ×3 IMPLANT
SUT PROLENE 6 0 BV (SUTURE) ×18 IMPLANT
SUT VIC AB 3-0 SH 27 (SUTURE) ×2
SUT VIC AB 3-0 SH 27X BRD (SUTURE) ×1 IMPLANT
UNDERPAD 30X30 (UNDERPADS AND DIAPERS) ×3 IMPLANT
WATER STERILE IRR 1000ML POUR (IV SOLUTION) ×3 IMPLANT

## 2016-11-05 NOTE — H&P (Signed)
   History and Physical Update  The patient was interviewed and re-examined.  The patient's previous History and Physical has been reviewed and is unchanged from recent office visit. Plan for superficialization and possible revision of right arm avf.   Paul Linn C. Donzetta Matters, MD [facility] [place] Office: [phone] Pager: [phone]   11/05/2016, 11:05 AM

## 2016-11-05 NOTE — Anesthesia Procedure Notes (Signed)
Procedure Name: MAC Date/Time: 11/05/2016 11:15 AM Performed by: Salli Quarry Lowana Hable Pre-anesthesia Checklist: Patient identified, Emergency Drugs available, Suction available and Patient being monitored Patient Re-evaluated:Patient Re-evaluated prior to induction Oxygen Delivery Method: Simple face mask

## 2016-11-05 NOTE — Anesthesia Postprocedure Evaluation (Signed)
Anesthesia Post Note  Patient: [name]  Procedure(s) Performed: Procedure(s) (LRB): REVISON WITH SUPERFISTULIZATION OF RIGHT ARM ARTERIOVENOUS FISTULA (Right)     Patient location during evaluation: PACU Anesthesia Type: MAC Level of consciousness: awake Pain management: pain level controlled Vital Signs Assessment: post-procedure vital signs reviewed and stable Respiratory status: spontaneous breathing Cardiovascular status: stable Postop Assessment: no apparent nausea or vomiting Anesthetic complications: no    Last Vitals:  Vitals:   11/05/16 1339 11/05/16 1345  BP: 138/83 139/81  Pulse: 72 72  Resp: (!) 22 (!) 21  Temp:  (!) 36.4 C  SpO2: 99% 100%    Last Pain:  Vitals:   11/05/16 0852  TempSrc: Oral                 Eleana Tocco

## 2016-11-05 NOTE — Discharge Instructions (Signed)
° °  [facility]  Discharge Instructions  AV Fistula or Graft Surgery for Dialysis Access  Please refer to the following instructions for your post-procedure care. Your surgeon or physician assistant will discuss any changes with you.  Activity  You may drive the day following your surgery, if you are comfortable and no longer taking prescription pain medication. Resume full activity as the soreness in your incision resolves.  Bathing/Showering  You may shower after you go home. Keep your incision dry for 48 hours. Do not soak in a bathtub, hot tub, or swim until the incision heals completely. You may not shower if you have a hemodialysis catheter.  Incision Care  Clean your incision with mild soap and water after 48 hours. Pat the area dry with a clean towel. You do not need a bandage unless otherwise instructed. Do not apply any ointments or creams to your incision. You may have skin glue on your incision. Do not peel it off. It will come off on its own in about one week. Your arm may swell a bit after surgery. To reduce swelling use pillows to elevate your arm so it is above your heart. Your doctor will tell you if you need to lightly wrap your arm with an ACE bandage.  Diet  Resume your normal diet. There are not special food restrictions following this procedure. In order to heal from your surgery, it is CRITICAL to get adequate nutrition. Your body requires vitamins, minerals, and protein. Vegetables are the best source of vitamins and minerals. Vegetables also provide the perfect balance of protein. Processed food has little nutritional value, so try to avoid this.  Medications  Resume taking all of your medications. If your incision is causing pain, you may take over-the counter pain relievers such as acetaminophen (Tylenol). If you were prescribed a stronger pain medication, please be aware these medications can cause nausea and constipation. Prevent  nausea by taking the medication with a snack or meal. Avoid constipation by drinking plenty of fluids and eating foods with high amount of fiber, such as fruits, vegetables, and grains. Do not take Tylenol if you are taking prescription pain medications.  Follow up Your surgeon may want to see you in the office following your access surgery. If so, this will be arranged at the time of your surgery.  Please call us immediately for any of the following conditions:  Increased pain, redness, drainage (pus) from your incision site Fever of 101 degrees or higher Severe or worsening pain at your incision site Hand pain or numbness.  Reduce your risk of vascular disease:  Stop smoking. If you would like help, call [facility] at 1-800-QUIT-NOW 971-786-4521) or [place] at [place] your cholesterol Maintain a desired weight Control your diabetes Keep your blood pressure down  Dialysis  It will take several weeks to several months for your new dialysis access to be ready for use. Your surgeon will determine when it is OK to use it. Your nephrologist will continue to direct your dialysis. You can continue to use your Permcath until your new access is ready for use.   11/05/2016 [name] [id number] 22-Jul-1950  Surgeon(s): Waynetta Sandy, MD  Procedure(s): REVISON WITH SUPERFISTULIZATION OF RIGHT ARM ARTERIOVENOUS FISTULA  x Do not stick fistula for 8 weeks    If you have any questions, please call the office at [phone].

## 2016-11-05 NOTE — Anesthesia Preprocedure Evaluation (Signed)
Anesthesia Evaluation  Patient identified by MRN, date of birth, ID band Patient awake    Reviewed: Allergy & Precautions, NPO status , Patient's Chart, lab work & pertinent test results  Airway Mallampati: II  TM Distance: >3 FB     Dental   Pulmonary shortness of breath, COPD, Current Smoker,    breath sounds clear to auscultation       Cardiovascular hypertension,  Rhythm:Regular Rate:Normal     Neuro/Psych    GI/Hepatic Neg liver ROS, GERD  ,  Endo/Other  diabetes  Renal/GU Renal disease     Musculoskeletal   Abdominal   Peds  Hematology  (+) anemia ,   Anesthesia Other Findings   Reproductive/Obstetrics                             Anesthesia Physical Anesthesia Plan  ASA: III  Anesthesia Plan: MAC   Post-op Pain Management:    Induction: Intravenous  PONV Risk Score and Plan:   Airway Management Planned: Simple Face Mask  Additional Equipment:   Intra-op Plan:   Post-operative Plan:   Informed Consent: I have reviewed the patients History and Physical, chart, labs and discussed the procedure including the risks, benefits and alternatives for the proposed anesthesia with the patient or authorized representative who has indicated his/her understanding and acceptance.   Dental advisory given  Plan Discussed with: Anesthesiologist and CRNA  Anesthesia Plan Comments:         Anesthesia Quick Evaluation

## 2016-11-05 NOTE — Op Note (Signed)
    Patient name: [name] MRN: [id number] DOB: 1950/05/02 Sex: male  11/05/2016 Pre-operative Diagnosis: esrd, malfunction of right arm av fistula Post-operative diagnosis:  Same Surgeon:  Eda Paschal. 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 He is now indicated for revision  Findings: The fistula was very tortuous. There was severe narrowing that was excised at one area and site more cephalad that was torn with dilatation had to be repaired. At completion there was a thrill in the vein cephalad and was pulsatile more distally.   Procedure:  The patient was identified in the holding area and taken to the operating room where he was placed supine on the operating table in [place] anesthesia induced. He was sterilely prepped and draped in the right arm and given antibiotics and timeout called. Following this we marked the tortuous fistula and then injected 1% lidocaine to 10 mL subcutaneously. A longitudinal incision was then made in the fistula was dissected free for approximately 10 cm. Branches were divided between clips and ties. There was an area of complete narrowing where there was no thrill or pulsatility throughout. We clamped the social problems and this remembered sounds conservative. On attempts to flush cephalad noted another area of tight stenosis which was then dilated and there was a tear but this was easily repaired with 6-0 Prolene suture. I then passed a 3 F[place] distally and was able to move this backbleeding. Cleaned off with heparinized saline both proximally and distally. The fistula was then spatulated on either end and sewn end-to-end with 6-0 Prolene suture. Prior to completing the anastomosis we did flush from both directions. We then completed the  anastomosis and had palpable thrill cephalad pulsatility throughout the fistula. We then obtained hemostasis to do the wound raised the skin flap laterally and closed the skin overlying the fistula with 4-0 monocryl suture. Patient tolerated procedure well without immediate complication. All counts were correct at completion.   Yasin Ducat C. Donzetta Matters, MD [facility] Office: [phone] Pager: [phone]

## 2016-11-05 NOTE — Transfer of Care (Signed)
Immediate Anesthesia Transfer of Care Note  Patient: [name]  Procedure(s) Performed: Procedure(s): REVISON WITH SUPERFISTULIZATION OF RIGHT ARM ARTERIOVENOUS FISTULA (Right)  Patient Location: PACU  Anesthesia Type:MAC  Level of Consciousness: awake, patient cooperative and responds to stimulation  Airway & Oxygen Therapy: Patient Spontanous Breathing and Patient connected to face mask oxygen  Post-op Assessment: Report given to RN and Post -op Vital signs reviewed and stable  Post vital signs: Reviewed and stable  Last Vitals:  Vitals:   11/05/16 0852  BP: (!) 183/96  Pulse: 90  Resp: 18  Temp: 37.3 C  SpO2: 100%    Last Pain:  Vitals:   11/05/16 0852  TempSrc: Oral      Patients Stated Pain Goal: 3 (54/00/86 7619)  Complications: No apparent anesthesia complications

## 2016-11-06 ENCOUNTER — Encounter (HOSPITAL_COMMUNITY): Payer: Self-pay | Admitting: Vascular Surgery

## 2016-11-06 ENCOUNTER — Telehealth: Payer: Self-pay | Admitting: Vascular Surgery

## 2016-11-06 DIAGNOSIS — D509 Iron deficiency anemia, unspecified: Secondary | ICD-10-CM | POA: Diagnosis not present

## 2016-11-06 DIAGNOSIS — D631 Anemia in chronic kidney disease: Secondary | ICD-10-CM | POA: Diagnosis not present

## 2016-11-06 DIAGNOSIS — Z23 Encounter for immunization: Secondary | ICD-10-CM | POA: Diagnosis not present

## 2016-11-06 DIAGNOSIS — N186 End stage renal disease: Secondary | ICD-10-CM | POA: Diagnosis not present

## 2016-11-06 DIAGNOSIS — E1129 Type 2 diabetes mellitus with other diabetic kidney complication: Secondary | ICD-10-CM | POA: Diagnosis not present

## 2016-11-06 DIAGNOSIS — N2581 Secondary hyperparathyroidism of renal origin: Secondary | ICD-10-CM | POA: Diagnosis not present

## 2016-11-06 LAB — GLUCOSE, CAPILLARY: GLUCOSE-CAPILLARY: 105 mg/dL — AB (ref 65–99)

## 2016-11-06 NOTE — Telephone Encounter (Signed)
Sched lab 12/01/16 at 4:00 and MD 12/04/16 at 3:45. Spoke to pt.

## 2016-11-06 NOTE — Telephone Encounter (Signed)
-----   Message from Denman George, RN sent at 11/05/2016  4:58 PM EDT ----- Regarding: needs 4 week f/u with (R) Arm access duplex and appt. with Dr. Donzetta Matters   ----- Message ----- From: Natividad Brood Sent: 11/05/2016  12:46 PM To: Vvs Charge Pool  S/p revision of RUA AVF.  F/u with Dr. Donzetta Matters in 4 weeks with duplex.  Thanks

## 2016-11-08 DIAGNOSIS — N186 End stage renal disease: Secondary | ICD-10-CM | POA: Diagnosis not present

## 2016-11-08 DIAGNOSIS — Z992 Dependence on renal dialysis: Secondary | ICD-10-CM | POA: Diagnosis not present

## 2016-11-08 DIAGNOSIS — E1122 Type 2 diabetes mellitus with diabetic chronic kidney disease: Secondary | ICD-10-CM | POA: Diagnosis not present

## 2016-11-09 DIAGNOSIS — N186 End stage renal disease: Secondary | ICD-10-CM | POA: Diagnosis not present

## 2016-11-09 DIAGNOSIS — D631 Anemia in chronic kidney disease: Secondary | ICD-10-CM | POA: Diagnosis not present

## 2016-11-09 DIAGNOSIS — N2581 Secondary hyperparathyroidism of renal origin: Secondary | ICD-10-CM | POA: Diagnosis not present

## 2016-11-09 DIAGNOSIS — D509 Iron deficiency anemia, unspecified: Secondary | ICD-10-CM | POA: Diagnosis not present

## 2016-11-09 DIAGNOSIS — E1129 Type 2 diabetes mellitus with other diabetic kidney complication: Secondary | ICD-10-CM | POA: Diagnosis not present

## 2016-11-11 DIAGNOSIS — E1129 Type 2 diabetes mellitus with other diabetic kidney complication: Secondary | ICD-10-CM | POA: Diagnosis not present

## 2016-11-11 DIAGNOSIS — N2581 Secondary hyperparathyroidism of renal origin: Secondary | ICD-10-CM | POA: Diagnosis not present

## 2016-11-11 DIAGNOSIS — D631 Anemia in chronic kidney disease: Secondary | ICD-10-CM | POA: Diagnosis not present

## 2016-11-11 DIAGNOSIS — N186 End stage renal disease: Secondary | ICD-10-CM | POA: Diagnosis not present

## 2016-11-11 DIAGNOSIS — D509 Iron deficiency anemia, unspecified: Secondary | ICD-10-CM | POA: Diagnosis not present

## 2016-11-13 DIAGNOSIS — D509 Iron deficiency anemia, unspecified: Secondary | ICD-10-CM | POA: Diagnosis not present

## 2016-11-13 DIAGNOSIS — N2581 Secondary hyperparathyroidism of renal origin: Secondary | ICD-10-CM | POA: Diagnosis not present

## 2016-11-13 DIAGNOSIS — D631 Anemia in chronic kidney disease: Secondary | ICD-10-CM | POA: Diagnosis not present

## 2016-11-13 DIAGNOSIS — N186 End stage renal disease: Secondary | ICD-10-CM | POA: Diagnosis not present

## 2016-11-13 DIAGNOSIS — E1129 Type 2 diabetes mellitus with other diabetic kidney complication: Secondary | ICD-10-CM | POA: Diagnosis not present

## 2016-11-16 DIAGNOSIS — D631 Anemia in chronic kidney disease: Secondary | ICD-10-CM | POA: Diagnosis not present

## 2016-11-16 DIAGNOSIS — N186 End stage renal disease: Secondary | ICD-10-CM | POA: Diagnosis not present

## 2016-11-16 DIAGNOSIS — N2581 Secondary hyperparathyroidism of renal origin: Secondary | ICD-10-CM | POA: Diagnosis not present

## 2016-11-16 DIAGNOSIS — E1129 Type 2 diabetes mellitus with other diabetic kidney complication: Secondary | ICD-10-CM | POA: Diagnosis not present

## 2016-11-16 DIAGNOSIS — D509 Iron deficiency anemia, unspecified: Secondary | ICD-10-CM | POA: Diagnosis not present

## 2016-11-18 DIAGNOSIS — D509 Iron deficiency anemia, unspecified: Secondary | ICD-10-CM | POA: Diagnosis not present

## 2016-11-18 DIAGNOSIS — N2581 Secondary hyperparathyroidism of renal origin: Secondary | ICD-10-CM | POA: Diagnosis not present

## 2016-11-18 DIAGNOSIS — D631 Anemia in chronic kidney disease: Secondary | ICD-10-CM | POA: Diagnosis not present

## 2016-11-18 DIAGNOSIS — E1129 Type 2 diabetes mellitus with other diabetic kidney complication: Secondary | ICD-10-CM | POA: Diagnosis not present

## 2016-11-18 DIAGNOSIS — N186 End stage renal disease: Secondary | ICD-10-CM | POA: Diagnosis not present

## 2016-11-20 DIAGNOSIS — D509 Iron deficiency anemia, unspecified: Secondary | ICD-10-CM | POA: Diagnosis not present

## 2016-11-20 DIAGNOSIS — N2581 Secondary hyperparathyroidism of renal origin: Secondary | ICD-10-CM | POA: Diagnosis not present

## 2016-11-20 DIAGNOSIS — N186 End stage renal disease: Secondary | ICD-10-CM | POA: Diagnosis not present

## 2016-11-20 DIAGNOSIS — E1129 Type 2 diabetes mellitus with other diabetic kidney complication: Secondary | ICD-10-CM | POA: Diagnosis not present

## 2016-11-20 DIAGNOSIS — D631 Anemia in chronic kidney disease: Secondary | ICD-10-CM | POA: Diagnosis not present

## 2016-11-23 DIAGNOSIS — E1129 Type 2 diabetes mellitus with other diabetic kidney complication: Secondary | ICD-10-CM | POA: Diagnosis not present

## 2016-11-23 DIAGNOSIS — N2581 Secondary hyperparathyroidism of renal origin: Secondary | ICD-10-CM | POA: Diagnosis not present

## 2016-11-23 DIAGNOSIS — D631 Anemia in chronic kidney disease: Secondary | ICD-10-CM | POA: Diagnosis not present

## 2016-11-23 DIAGNOSIS — N186 End stage renal disease: Secondary | ICD-10-CM | POA: Diagnosis not present

## 2016-11-23 DIAGNOSIS — D509 Iron deficiency anemia, unspecified: Secondary | ICD-10-CM | POA: Diagnosis not present

## 2016-11-25 DIAGNOSIS — N186 End stage renal disease: Secondary | ICD-10-CM | POA: Diagnosis not present

## 2016-11-25 DIAGNOSIS — E1129 Type 2 diabetes mellitus with other diabetic kidney complication: Secondary | ICD-10-CM | POA: Diagnosis not present

## 2016-11-25 DIAGNOSIS — D509 Iron deficiency anemia, unspecified: Secondary | ICD-10-CM | POA: Diagnosis not present

## 2016-11-25 DIAGNOSIS — D631 Anemia in chronic kidney disease: Secondary | ICD-10-CM | POA: Diagnosis not present

## 2016-11-25 DIAGNOSIS — N2581 Secondary hyperparathyroidism of renal origin: Secondary | ICD-10-CM | POA: Diagnosis not present

## 2016-11-27 DIAGNOSIS — D509 Iron deficiency anemia, unspecified: Secondary | ICD-10-CM | POA: Diagnosis not present

## 2016-11-27 DIAGNOSIS — E1129 Type 2 diabetes mellitus with other diabetic kidney complication: Secondary | ICD-10-CM | POA: Diagnosis not present

## 2016-11-27 DIAGNOSIS — N2581 Secondary hyperparathyroidism of renal origin: Secondary | ICD-10-CM | POA: Diagnosis not present

## 2016-11-27 DIAGNOSIS — N186 End stage renal disease: Secondary | ICD-10-CM | POA: Diagnosis not present

## 2016-11-27 DIAGNOSIS — D631 Anemia in chronic kidney disease: Secondary | ICD-10-CM | POA: Diagnosis not present

## 2016-11-30 DIAGNOSIS — N2581 Secondary hyperparathyroidism of renal origin: Secondary | ICD-10-CM | POA: Diagnosis not present

## 2016-11-30 DIAGNOSIS — D509 Iron deficiency anemia, unspecified: Secondary | ICD-10-CM | POA: Diagnosis not present

## 2016-11-30 DIAGNOSIS — E1129 Type 2 diabetes mellitus with other diabetic kidney complication: Secondary | ICD-10-CM | POA: Diagnosis not present

## 2016-11-30 DIAGNOSIS — D631 Anemia in chronic kidney disease: Secondary | ICD-10-CM | POA: Diagnosis not present

## 2016-11-30 DIAGNOSIS — N186 End stage renal disease: Secondary | ICD-10-CM | POA: Diagnosis not present

## 2016-12-01 ENCOUNTER — Inpatient Hospital Stay (HOSPITAL_COMMUNITY)
Admission: RE | Admit: 2016-12-01 | Discharge: 2016-12-01 | Disposition: A | Discharge status: Home / Self Care | Payer: Medicare Other | Source: Ambulatory Visit

## 2016-12-01 DIAGNOSIS — N186 End stage renal disease: Secondary | ICD-10-CM

## 2016-12-01 DIAGNOSIS — Z48812 Encounter for surgical aftercare following surgery on the circulatory system: Secondary | ICD-10-CM

## 2016-12-02 DIAGNOSIS — D509 Iron deficiency anemia, unspecified: Secondary | ICD-10-CM | POA: Diagnosis not present

## 2016-12-02 DIAGNOSIS — E1129 Type 2 diabetes mellitus with other diabetic kidney complication: Secondary | ICD-10-CM | POA: Diagnosis not present

## 2016-12-02 DIAGNOSIS — N2581 Secondary hyperparathyroidism of renal origin: Secondary | ICD-10-CM | POA: Diagnosis not present

## 2016-12-02 DIAGNOSIS — D631 Anemia in chronic kidney disease: Secondary | ICD-10-CM | POA: Diagnosis not present

## 2016-12-02 DIAGNOSIS — N186 End stage renal disease: Secondary | ICD-10-CM | POA: Diagnosis not present

## 2016-12-04 ENCOUNTER — Encounter: Payer: Medicare Other | Admitting: Vascular Surgery

## 2016-12-04 DIAGNOSIS — E1129 Type 2 diabetes mellitus with other diabetic kidney complication: Secondary | ICD-10-CM | POA: Diagnosis not present

## 2016-12-04 DIAGNOSIS — D631 Anemia in chronic kidney disease: Secondary | ICD-10-CM | POA: Diagnosis not present

## 2016-12-04 DIAGNOSIS — N2581 Secondary hyperparathyroidism of renal origin: Secondary | ICD-10-CM | POA: Diagnosis not present

## 2016-12-04 DIAGNOSIS — D509 Iron deficiency anemia, unspecified: Secondary | ICD-10-CM | POA: Diagnosis not present

## 2016-12-04 DIAGNOSIS — N186 End stage renal disease: Secondary | ICD-10-CM | POA: Diagnosis not present

## 2016-12-07 DIAGNOSIS — N186 End stage renal disease: Secondary | ICD-10-CM | POA: Diagnosis not present

## 2016-12-07 DIAGNOSIS — N2581 Secondary hyperparathyroidism of renal origin: Secondary | ICD-10-CM | POA: Diagnosis not present

## 2016-12-07 DIAGNOSIS — D509 Iron deficiency anemia, unspecified: Secondary | ICD-10-CM | POA: Diagnosis not present

## 2016-12-07 DIAGNOSIS — D631 Anemia in chronic kidney disease: Secondary | ICD-10-CM | POA: Diagnosis not present

## 2016-12-07 DIAGNOSIS — E1129 Type 2 diabetes mellitus with other diabetic kidney complication: Secondary | ICD-10-CM | POA: Diagnosis not present

## 2016-12-09 DIAGNOSIS — D509 Iron deficiency anemia, unspecified: Secondary | ICD-10-CM | POA: Diagnosis not present

## 2016-12-09 DIAGNOSIS — E1129 Type 2 diabetes mellitus with other diabetic kidney complication: Secondary | ICD-10-CM | POA: Diagnosis not present

## 2016-12-09 DIAGNOSIS — D631 Anemia in chronic kidney disease: Secondary | ICD-10-CM | POA: Diagnosis not present

## 2016-12-09 DIAGNOSIS — N2581 Secondary hyperparathyroidism of renal origin: Secondary | ICD-10-CM | POA: Diagnosis not present

## 2016-12-09 DIAGNOSIS — Z992 Dependence on renal dialysis: Secondary | ICD-10-CM | POA: Diagnosis not present

## 2016-12-09 DIAGNOSIS — N186 End stage renal disease: Secondary | ICD-10-CM | POA: Diagnosis not present

## 2016-12-09 DIAGNOSIS — E1122 Type 2 diabetes mellitus with diabetic chronic kidney disease: Secondary | ICD-10-CM | POA: Diagnosis not present

## 2016-12-14 DIAGNOSIS — D631 Anemia in chronic kidney disease: Secondary | ICD-10-CM | POA: Diagnosis not present

## 2016-12-14 DIAGNOSIS — N186 End stage renal disease: Secondary | ICD-10-CM | POA: Diagnosis not present

## 2016-12-14 DIAGNOSIS — N2581 Secondary hyperparathyroidism of renal origin: Secondary | ICD-10-CM | POA: Diagnosis not present

## 2016-12-14 DIAGNOSIS — E1129 Type 2 diabetes mellitus with other diabetic kidney complication: Secondary | ICD-10-CM | POA: Diagnosis not present

## 2016-12-14 DIAGNOSIS — D509 Iron deficiency anemia, unspecified: Secondary | ICD-10-CM | POA: Diagnosis not present

## 2016-12-15 ENCOUNTER — Encounter (HOSPITAL_COMMUNITY): Payer: Self-pay

## 2016-12-15 ENCOUNTER — Emergency Department (HOSPITAL_COMMUNITY)
Admission: EM | Admit: 2016-12-15 | Discharge: 2016-12-15 | Disposition: A | Discharge status: Home / Self Care | Payer: Medicare Other | Attending: Emergency Medicine | Admitting: Emergency Medicine

## 2016-12-15 ENCOUNTER — Other Ambulatory Visit: Payer: Self-pay

## 2016-12-15 DIAGNOSIS — R030 Elevated blood-pressure reading, without diagnosis of hypertension: Secondary | ICD-10-CM | POA: Diagnosis not present

## 2016-12-15 DIAGNOSIS — Z7984 Long term (current) use of oral hypoglycemic drugs: Secondary | ICD-10-CM | POA: Insufficient documentation

## 2016-12-15 DIAGNOSIS — M6281 Muscle weakness (generalized): Secondary | ICD-10-CM | POA: Insufficient documentation

## 2016-12-15 DIAGNOSIS — E1122 Type 2 diabetes mellitus with diabetic chronic kidney disease: Secondary | ICD-10-CM | POA: Diagnosis not present

## 2016-12-15 DIAGNOSIS — Z79899 Other long term (current) drug therapy: Secondary | ICD-10-CM | POA: Insufficient documentation

## 2016-12-15 DIAGNOSIS — Z96611 Presence of right artificial shoulder joint: Secondary | ICD-10-CM | POA: Diagnosis not present

## 2016-12-15 DIAGNOSIS — R531 Weakness: Secondary | ICD-10-CM

## 2016-12-15 DIAGNOSIS — J449 Chronic obstructive pulmonary disease, unspecified: Secondary | ICD-10-CM | POA: Diagnosis not present

## 2016-12-15 DIAGNOSIS — F17210 Nicotine dependence, cigarettes, uncomplicated: Secondary | ICD-10-CM | POA: Diagnosis not present

## 2016-12-15 DIAGNOSIS — Z96643 Presence of artificial hip joint, bilateral: Secondary | ICD-10-CM | POA: Diagnosis not present

## 2016-12-15 DIAGNOSIS — Z8546 Personal history of malignant neoplasm of prostate: Secondary | ICD-10-CM | POA: Diagnosis not present

## 2016-12-15 DIAGNOSIS — Z992 Dependence on renal dialysis: Secondary | ICD-10-CM | POA: Insufficient documentation

## 2016-12-15 DIAGNOSIS — N186 End stage renal disease: Secondary | ICD-10-CM | POA: Diagnosis not present

## 2016-12-15 DIAGNOSIS — I120 Hypertensive chronic kidney disease with stage 5 chronic kidney disease or end stage renal disease: Secondary | ICD-10-CM | POA: Diagnosis not present

## 2016-12-15 LAB — BASIC METABOLIC PANEL
Anion gap: 12 (ref 5–15)
BUN: 24 mg/dL — AB (ref 6–20)
CHLORIDE: 100 mmol/L — AB (ref 101–111)
CO2: 25 mmol/L (ref 22–32)
CREATININE: 7.93 mg/dL — AB (ref 0.61–1.24)
Calcium: 9.4 mg/dL (ref 8.9–10.3)
GFR calc Af Amer: 7 mL/min — ABNORMAL LOW (ref >60)
GFR calc non Af Amer: 6 mL/min — ABNORMAL LOW (ref >60)
GLUCOSE: 66 mg/dL (ref 65–99)
POTASSIUM: 4 mmol/L (ref 3.5–5.1)
SODIUM: 137 mmol/L (ref 135–145)

## 2016-12-15 LAB — CBC
HEMATOCRIT: 32.7 % — AB (ref 39.0–52.0)
Hemoglobin: 10.6 g/dL — ABNORMAL LOW (ref 13.0–17.0)
MCH: 33.7 pg (ref 26.0–34.0)
MCHC: 32.4 g/dL (ref 30.0–36.0)
MCV: 103.8 fL — AB (ref 78.0–100.0)
PLATELETS: 301 10*3/uL (ref 150–400)
RBC: 3.15 MIL/uL — ABNORMAL LOW (ref 4.22–5.81)
RDW: 15.6 % — AB (ref 11.5–15.5)
WBC: 10.8 10*3/uL — AB (ref 4.0–10.5)

## 2016-12-15 LAB — URINALYSIS, ROUTINE W REFLEX MICROSCOPIC
BACTERIA UA: NONE SEEN
BILIRUBIN URINE: NEGATIVE
Glucose, UA: 50 mg/dL — AB
Hgb urine dipstick: NEGATIVE
KETONES UR: NEGATIVE mg/dL
LEUKOCYTES UA: NEGATIVE
Nitrite: NEGATIVE
PH: 7 (ref 5.0–8.0)
Protein, ur: >=300 mg/dL — AB
SPECIFIC GRAVITY, URINE: 1.011 (ref 1.005–1.030)
SQUAMOUS EPITHELIAL / LPF: NONE SEEN

## 2016-12-15 MED ORDER — SODIUM CHLORIDE 0.9 % IV BOLUS (SEPSIS)
500.0000 mL | Freq: Once | INTRAVENOUS | Status: AC
  Administered 2016-12-15: 500 mL via INTRAVENOUS

## 2016-12-15 NOTE — Discharge Instructions (Signed)
Keep your scheduled appointment for dialysis tomorrow.  Ask them to recheck your blood pressure at dialysis.  Today's was elevated at

## 2016-12-15 NOTE — ED Triage Notes (Signed)
Pt states he is MWF dialysis pt. He states after dialysis on Monday he began feeling weak and having diarrhea. Pt alert and oriented X4, no distress noted. Skin warm and dry.

## 2016-12-15 NOTE — ED Notes (Signed)
Patient denies pain and is resting comfortably.  
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
## 2016-12-16 DIAGNOSIS — N186 End stage renal disease: Secondary | ICD-10-CM | POA: Diagnosis not present

## 2016-12-16 DIAGNOSIS — D509 Iron deficiency anemia, unspecified: Secondary | ICD-10-CM | POA: Diagnosis not present

## 2016-12-16 DIAGNOSIS — D631 Anemia in chronic kidney disease: Secondary | ICD-10-CM | POA: Diagnosis not present

## 2016-12-16 DIAGNOSIS — E1129 Type 2 diabetes mellitus with other diabetic kidney complication: Secondary | ICD-10-CM | POA: Diagnosis not present

## 2016-12-16 DIAGNOSIS — N2581 Secondary hyperparathyroidism of renal origin: Secondary | ICD-10-CM | POA: Diagnosis not present

## 2016-12-17 ENCOUNTER — Ambulatory Visit (HOSPITAL_COMMUNITY): Admission: RE | Admit: 2016-12-17 | Payer: Medicare Other | Source: Ambulatory Visit

## 2016-12-18 ENCOUNTER — Encounter: Payer: Self-pay | Admitting: Vascular Surgery

## 2016-12-18 ENCOUNTER — Encounter: Payer: Self-pay | Admitting: *Deleted

## 2016-12-18 ENCOUNTER — Ambulatory Visit (INDEPENDENT_AMBULATORY_CARE_PROVIDER_SITE_OTHER): Payer: Medicare Other | Admitting: Vascular Surgery

## 2016-12-18 VITALS — BP 177/78 | HR 102 | Temp 98.5°F | Resp 16 | Ht 68.5 in | Wt 212.0 lb

## 2016-12-18 DIAGNOSIS — D509 Iron deficiency anemia, unspecified: Secondary | ICD-10-CM | POA: Diagnosis not present

## 2016-12-18 DIAGNOSIS — E1129 Type 2 diabetes mellitus with other diabetic kidney complication: Secondary | ICD-10-CM | POA: Diagnosis not present

## 2016-12-18 DIAGNOSIS — N186 End stage renal disease: Secondary | ICD-10-CM | POA: Diagnosis not present

## 2016-12-18 DIAGNOSIS — N2581 Secondary hyperparathyroidism of renal origin: Secondary | ICD-10-CM | POA: Diagnosis not present

## 2016-12-18 DIAGNOSIS — D631 Anemia in chronic kidney disease: Secondary | ICD-10-CM | POA: Diagnosis not present
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
## 2016-12-21 ENCOUNTER — Other Ambulatory Visit: Payer: Self-pay | Admitting: *Deleted

## 2016-12-21 DIAGNOSIS — N186 End stage renal disease: Secondary | ICD-10-CM | POA: Diagnosis not present

## 2016-12-21 DIAGNOSIS — E1129 Type 2 diabetes mellitus with other diabetic kidney complication: Secondary | ICD-10-CM | POA: Diagnosis not present

## 2016-12-21 DIAGNOSIS — D509 Iron deficiency anemia, unspecified: Secondary | ICD-10-CM | POA: Diagnosis not present

## 2016-12-21 DIAGNOSIS — N2581 Secondary hyperparathyroidism of renal origin: Secondary | ICD-10-CM | POA: Diagnosis not present

## 2016-12-21 DIAGNOSIS — D631 Anemia in chronic kidney disease: Secondary | ICD-10-CM | POA: Diagnosis not present

## 2016-12-23 DIAGNOSIS — D631 Anemia in chronic kidney disease: Secondary | ICD-10-CM | POA: Diagnosis not present

## 2016-12-23 DIAGNOSIS — E1129 Type 2 diabetes mellitus with other diabetic kidney complication: Secondary | ICD-10-CM | POA: Diagnosis not present

## 2016-12-23 DIAGNOSIS — N2581 Secondary hyperparathyroidism of renal origin: Secondary | ICD-10-CM | POA: Diagnosis not present

## 2016-12-23 DIAGNOSIS — N186 End stage renal disease: Secondary | ICD-10-CM | POA: Diagnosis not present

## 2016-12-23 DIAGNOSIS — D509 Iron deficiency anemia, unspecified: Secondary | ICD-10-CM | POA: Diagnosis not present

## 2016-12-25 DIAGNOSIS — D631 Anemia in chronic kidney disease: Secondary | ICD-10-CM | POA: Diagnosis not present

## 2016-12-25 DIAGNOSIS — N186 End stage renal disease: Secondary | ICD-10-CM | POA: Diagnosis not present

## 2016-12-25 DIAGNOSIS — D509 Iron deficiency anemia, unspecified: Secondary | ICD-10-CM | POA: Diagnosis not present

## 2016-12-25 DIAGNOSIS — N2581 Secondary hyperparathyroidism of renal origin: Secondary | ICD-10-CM | POA: Diagnosis not present

## 2016-12-25 DIAGNOSIS — E1129 Type 2 diabetes mellitus with other diabetic kidney complication: Secondary | ICD-10-CM | POA: Diagnosis not present

## 2016-12-27 DIAGNOSIS — D509 Iron deficiency anemia, unspecified: Secondary | ICD-10-CM | POA: Diagnosis not present

## 2016-12-27 DIAGNOSIS — N2581 Secondary hyperparathyroidism of renal origin: Secondary | ICD-10-CM | POA: Diagnosis not present

## 2016-12-27 DIAGNOSIS — D631 Anemia in chronic kidney disease: Secondary | ICD-10-CM | POA: Diagnosis not present

## 2016-12-27 DIAGNOSIS — E1129 Type 2 diabetes mellitus with other diabetic kidney complication: Secondary | ICD-10-CM | POA: Diagnosis not present

## 2016-12-27 DIAGNOSIS — N186 End stage renal disease: Secondary | ICD-10-CM | POA: Diagnosis not present

## 2016-12-28 ENCOUNTER — Encounter (HOSPITAL_COMMUNITY): Payer: Self-pay | Admitting: Vascular Surgery

## 2016-12-28 ENCOUNTER — Ambulatory Visit (HOSPITAL_COMMUNITY)
Admission: RE | Admit: 2016-12-28 | Discharge: 2016-12-28 | Disposition: A | Discharge status: Home / Self Care | Payer: Medicare Other | Source: Ambulatory Visit | Attending: Vascular Surgery | Admitting: Vascular Surgery

## 2016-12-28 ENCOUNTER — Encounter (HOSPITAL_COMMUNITY)
Admission: RE | Disposition: A | Discharge status: Home / Self Care | Payer: Self-pay | Source: Ambulatory Visit | Attending: Vascular Surgery

## 2016-12-28 DIAGNOSIS — K219 Gastro-esophageal reflux disease without esophagitis: Secondary | ICD-10-CM | POA: Diagnosis not present

## 2016-12-28 DIAGNOSIS — J449 Chronic obstructive pulmonary disease, unspecified: Secondary | ICD-10-CM | POA: Diagnosis not present

## 2016-12-28 DIAGNOSIS — Z8249 Family history of ischemic heart disease and other diseases of the circulatory system: Secondary | ICD-10-CM | POA: Diagnosis not present

## 2016-12-28 DIAGNOSIS — I120 Hypertensive chronic kidney disease with stage 5 chronic kidney disease or end stage renal disease: Secondary | ICD-10-CM | POA: Diagnosis not present

## 2016-12-28 DIAGNOSIS — E7800 Pure hypercholesterolemia, unspecified: Secondary | ICD-10-CM | POA: Diagnosis not present

## 2016-12-28 DIAGNOSIS — Y832 Surgical operation with anastomosis, bypass or graft as the cause of abnormal reaction of the patient, or of later complication, without mention of misadventure at the time of the procedure: Secondary | ICD-10-CM | POA: Diagnosis not present

## 2016-12-28 DIAGNOSIS — T82858A Stenosis of vascular prosthetic devices, implants and grafts, initial encounter: Secondary | ICD-10-CM | POA: Diagnosis not present

## 2016-12-28 DIAGNOSIS — T82898A Other specified complication of vascular prosthetic devices, implants and grafts, initial encounter: Secondary | ICD-10-CM | POA: Diagnosis not present

## 2016-12-28 DIAGNOSIS — E1122 Type 2 diabetes mellitus with diabetic chronic kidney disease: Secondary | ICD-10-CM | POA: Diagnosis not present

## 2016-12-28 DIAGNOSIS — M19041 Primary osteoarthritis, right hand: Secondary | ICD-10-CM | POA: Insufficient documentation

## 2016-12-28 DIAGNOSIS — N186 End stage renal disease: Secondary | ICD-10-CM | POA: Diagnosis not present

## 2016-12-28 DIAGNOSIS — Z7984 Long term (current) use of oral hypoglycemic drugs: Secondary | ICD-10-CM | POA: Insufficient documentation

## 2016-12-28 DIAGNOSIS — F17210 Nicotine dependence, cigarettes, uncomplicated: Secondary | ICD-10-CM | POA: Diagnosis not present

## 2016-12-28 DIAGNOSIS — Z992 Dependence on renal dialysis: Secondary | ICD-10-CM | POA: Diagnosis not present

## 2016-12-28 HISTORY — PX: PERIPHERAL VASCULAR BALLOON ANGIOPLASTY: CATH118281

## 2016-12-28 HISTORY — PX: A/V FISTULAGRAM: CATH118298

## 2016-12-28 LAB — POCT I-STAT, CHEM 8
BUN: 26 mg/dL — AB (ref 6–20)
CALCIUM ION: 1.12 mmol/L — AB (ref 1.15–1.40)
Chloride: 100 mmol/L — ABNORMAL LOW (ref 101–111)
Creatinine, Ser: 5.9 mg/dL — ABNORMAL HIGH (ref 0.61–1.24)
GLUCOSE: 98 mg/dL (ref 65–99)
HCT: 33 % — ABNORMAL LOW (ref 39.0–52.0)
Hemoglobin: 11.2 g/dL — ABNORMAL LOW (ref 13.0–17.0)
Potassium: 3.6 mmol/L (ref 3.5–5.1)
SODIUM: 138 mmol/L (ref 135–145)
TCO2: 27 mmol/L (ref 22–32)

## 2016-12-28 LAB — GLUCOSE, CAPILLARY: GLUCOSE-CAPILLARY: 102 mg/dL — AB (ref 65–99)

## 2016-12-28 SURGERY — A/V FISTULAGRAM
Anesthesia: LOCAL | Laterality: Right

## 2016-12-28 MED ORDER — SODIUM CHLORIDE 0.9% FLUSH: 3.0000 mL | Freq: Two times a day (BID) | INTRAVENOUS | Status: DC

## 2016-12-28 MED ORDER — FENTANYL CITRATE (PF) 100 MCG/2ML IJ SOLN
INTRAMUSCULAR | Status: AC
  Filled 2016-12-28: qty 2

## 2016-12-28 MED ORDER — LIDOCAINE HCL (PF) 1 % IJ SOLN
INTRAMUSCULAR | Status: AC
  Filled 2016-12-28: qty 30

## 2016-12-28 MED ORDER — HEPARIN SODIUM (PORCINE) 1000 UNIT/ML IJ SOLN
INTRAMUSCULAR | Status: AC
  Filled 2016-12-28: qty 1

## 2016-12-28 MED ORDER — HEPARIN (PORCINE) IN NACL 2-0.9 UNIT/ML-% IJ SOLN
INTRAMUSCULAR | Status: AC
  Filled 2016-12-28: qty 500

## 2016-12-28 MED ORDER — LABETALOL HCL 5 MG/ML IV SOLN
INTRAVENOUS | Status: AC
  Filled 2016-12-28: qty 4

## 2016-12-28 MED ORDER — FENTANYL CITRATE (PF) 100 MCG/2ML IJ SOLN
INTRAMUSCULAR | Status: DC | PRN
  Administered 2016-12-28: 50 ug via INTRAVENOUS

## 2016-12-28 MED ORDER — LABETALOL HCL 5 MG/ML IV SOLN
INTRAVENOUS | Status: DC | PRN
  Administered 2016-12-28: 10 mg via INTRAVENOUS

## 2016-12-28 MED ORDER — HEPARIN (PORCINE) IN NACL 2-0.9 UNIT/ML-% IJ SOLN
INTRAMUSCULAR | Status: AC | PRN
  Administered 2016-12-28: 500 mL

## 2016-12-28 MED ORDER — IODIXANOL 320 MG/ML IV SOLN
INTRAVENOUS | Status: DC | PRN
  Administered 2016-12-28: 60 mL via INTRAVENOUS

## 2016-12-28 MED ORDER — MIDAZOLAM HCL 2 MG/2ML IJ SOLN
INTRAMUSCULAR | Status: DC | PRN
  Administered 2016-12-28: 1 mg via INTRAVENOUS

## 2016-12-28 MED ORDER — MIDAZOLAM HCL 2 MG/2ML IJ SOLN
INTRAMUSCULAR | Status: AC
  Filled 2016-12-28: qty 2

## 2016-12-28 MED ORDER — LIDOCAINE HCL (PF) 1 % IJ SOLN
INTRAMUSCULAR | Status: DC | PRN
  Administered 2016-12-28 (×2): 2 mL via INTRADERMAL

## 2016-12-28 MED ORDER — HEPARIN SODIUM (PORCINE) 1000 UNIT/ML IJ SOLN
INTRAMUSCULAR | Status: DC | PRN
  Administered 2016-12-28: 6000 [IU] via INTRAVENOUS

## 2016-12-28 MED ORDER — SODIUM CHLORIDE 0.9 % IV SOLN: 250.0000 mL | INTRAVENOUS | Status: DC | PRN

## 2016-12-28 MED ORDER — SODIUM CHLORIDE 0.9% FLUSH: 3.0000 mL | INTRAVENOUS | Status: DC | PRN

## 2016-12-28 SURGICAL SUPPLY — 22 items
BAG SNAP BAND KOVER 36X36 (MISCELLANEOUS) ×3 IMPLANT
BALLN ARMADA 5X80X80 (BALLOONS) ×3
BALLN MUSTANG 4.0X40 75 (BALLOONS) ×3
BALLN MUSTANG 5.0X40 75 (BALLOONS) ×3
BALLOON ARMADA 5X80X80 (BALLOONS) ×2 IMPLANT
BALLOON MUSTANG 4.0X40 75 (BALLOONS) ×2 IMPLANT
BALLOON MUSTANG 5.0X40 75 (BALLOONS) ×2 IMPLANT
CATH SOFT-VU 4F 65 STRAIGHT (CATHETERS) ×2 IMPLANT
CATH SOFT-VU STRAIGHT 4F 65CM (CATHETERS) ×1
COVER DOME SNAP 22 D (MISCELLANEOUS) ×3 IMPLANT
COVER PRB 48X5XTLSCP FOLD TPE (BAG) ×4 IMPLANT
COVER PROBE 5X48 (BAG) ×2
KIT ENCORE 26 ADVANTAGE (KITS) ×6 IMPLANT
KIT MICROINTRODUCER STIFF 5F (SHEATH) ×3 IMPLANT
PROTECTION STATION PRESSURIZED (MISCELLANEOUS) ×3
SHEATH PINNACLE R/O II 6F 4CM (SHEATH) ×6 IMPLANT
STATION PROTECTION PRESSURIZED (MISCELLANEOUS) ×2 IMPLANT
STOPCOCK MORSE 400PSI 3WAY (MISCELLANEOUS) ×3 IMPLANT
TRAY PV CATH (CUSTOM PROCEDURE TRAY) ×3 IMPLANT
TUBING CIL FLEX 10 FLL-RA (TUBING) ×3 IMPLANT
WIRE BENTSON .035X145CM (WIRE) ×3 IMPLANT
WIRE ROSEN-J .035X260CM (WIRE) ×3 IMPLANT

## 2016-12-28 NOTE — Interval H&P Note (Signed)
History and Physical Interval Note:  12/28/2016 9:27 AM  [name]  has presented today for surgery, with the diagnosis of poor flow in fistula  The various methods of treatment have been discussed with the patient and family. After consideration of risks, benefits and other options for treatment, the patient has consented to  Procedure(s): A/V FISTULAGRAM - Right Arm (N/A) as a surgical intervention .  The patient's history has been reviewed, patient examined, no change in status, stable for surgery.  I have reviewed the patient's chart and labs.  Questions were answered to the patient's satisfaction.     Deitra Mayo

## 2016-12-28 NOTE — Discharge Instructions (Signed)

## 2016-12-28 NOTE — Op Note (Signed)
   PATIENT: [name]      MRN: [id number] DOB: 18-Dec-1950    DATE OF PROCEDURE: 12/28/2016  INDICATIONS:    [name] is a 66 y.o. male who presents with a poorly functioning right upper arm fistula.  He was set up for a fistulogram.  PROCEDURE:    1.  Ultrasound-guided access to right brachiocephalic AV fistula x2 2.  Fistulogram right brachiocephalic AV fistula 3.  Venoplasty right brachiocephalic AV fistula x2 4.  Conscious sedation  SURGEON: Judeth Cornfield. Scot Dock, MD, FACS  ANESTHESIA: Local with sedation  EBL: Minimal  TECHNIQUE: The patient was taken to the peripheral vascular lab and was sedated. The period of conscious sedation was 62 minutes.  During that time period, I was present face-to-face 100% of the time.  The patient was administered 1 mg of Versed and 50 mcg of fentanyl. The patient's heart rate, blood pressure, and oxygen saturation were monitored by the nurse continuously during the procedure.  The right arm was prepped and draped in usual sterile fashion.  Under ultrasound guidance, after the skin was anesthetized, the proximal fistula was cannulated with a micropuncture needle and a micropuncture sheath introduced over the wire.  Fistulogram was obtained to evaluate the entire fistula to include the central veins.  There was a long approximately 6 cm in length 90% stenosis throughout the fistula.  I would like to to address this with venoplasty.  The micropuncture sheath was exchanged for a short 6 [place] sheath over a Bentson wire.  Patient was heparinized.  Initially selected a 5 mm x 80 mm balloon.  This was positioned across the stenosis and inflated to nominal pressure for 1 minute.  Completion film showed some mild residual stenosis.  I went back again to 14 atm for 1 minute.  Completion film showed good result.  There was also significant stenosis at the arterial end.  And I therefore sutured the current cannulation site after the wire was removed.   Pressure was held for hemostasis.  I then cannulated the fistula higher up retrograde.  The wire was advanced through the anastomosis distally.  I exchanged this for a Rosen wire over his straight catheter.  I selected a 4 mm x 40 mm balloon.  This was positioned across the stenosis and inflated to nominal pressure for 1 minute.  And then went back with a 5 mm x 40 mm balloon as there was still some residual stenosis.  This was inflated to nominal pressure for 1 minute.  Patient was minimal residual stenosis.  The patient had an excellent thrill in his fistula.  Cannulation site was closed with a 4-0 Monocryl.  Patient tolerated the procedure well transferred to the recovery room in stable condition.  FINDINGS:   Long segment 90% stenosis of upper arm fistula which was successfully ballooned as described above. Also significant stenosis at the arterial end which was also successfully balloon. She was otherwise widely patent with no central venous stenosis.  CLINICAL NOTE: Patient come back on a nondialysis day for duplex in 3 weeks.  Deitra Mayo, MD, FACS [facility]  DATE OF DICTATION:   12/28/2016

## 2016-12-29 ENCOUNTER — Telehealth: Payer: Self-pay | Admitting: Vascular Surgery

## 2016-12-29 ENCOUNTER — Other Ambulatory Visit: Payer: Self-pay

## 2016-12-29 DIAGNOSIS — Z992 Dependence on renal dialysis: Principal | ICD-10-CM

## 2016-12-29 DIAGNOSIS — Z48812 Encounter for surgical aftercare following surgery on the circulatory system: Secondary | ICD-10-CM

## 2016-12-29 DIAGNOSIS — N186 End stage renal disease: Secondary | ICD-10-CM

## 2016-12-29 NOTE — Telephone Encounter (Signed)
Sched appt 01/20/17; lab at 3:00 and MD at 3:45. Spoke to pt.

## 2016-12-29 NOTE — Telephone Encounter (Signed)
-----   Message from Mena Goes, RN sent at 12/28/2016 10:50 AM EST ----- Regarding: 3-4 weeks w/ duplex   ----- Message ----- From: Angelia Mould, MD Sent: 12/28/2016  10:45 AM To: [facility] Charge Pool Subject: charge                                          PROCEDURE:   1.  Ultrasound-guided access to right brachiocephalic AV fistula x2 2.  Fistulogram right brachiocephalic AV fistula 3.  Venoplasty right brachiocephalic AV fistula x2 4.  Conscious sedation  SURGEON: Judeth Cornfield. Scot Dock, MD, FACS  ANESTHESIA: Local with sedation  He needs a follow-up visit in 3-4 weeks with a duplex at that time to check on his fistula.  Thank you.  He dialyzes I believe on Tuesday Thursday Saturday

## 2017-01-01 DIAGNOSIS — E1129 Type 2 diabetes mellitus with other diabetic kidney complication: Secondary | ICD-10-CM | POA: Diagnosis not present

## 2017-01-01 DIAGNOSIS — N186 End stage renal disease: Secondary | ICD-10-CM | POA: Diagnosis not present

## 2017-01-01 DIAGNOSIS — D509 Iron deficiency anemia, unspecified: Secondary | ICD-10-CM | POA: Diagnosis not present

## 2017-01-01 DIAGNOSIS — N2581 Secondary hyperparathyroidism of renal origin: Secondary | ICD-10-CM | POA: Diagnosis not present

## 2017-01-01 DIAGNOSIS — D631 Anemia in chronic kidney disease: Secondary | ICD-10-CM | POA: Diagnosis not present

## 2017-01-04 DIAGNOSIS — N186 End stage renal disease: Secondary | ICD-10-CM | POA: Diagnosis not present

## 2017-01-04 DIAGNOSIS — N2581 Secondary hyperparathyroidism of renal origin: Secondary | ICD-10-CM | POA: Diagnosis not present

## 2017-01-04 DIAGNOSIS — D631 Anemia in chronic kidney disease: Secondary | ICD-10-CM | POA: Diagnosis not present

## 2017-01-04 DIAGNOSIS — D509 Iron deficiency anemia, unspecified: Secondary | ICD-10-CM | POA: Diagnosis not present

## 2017-01-04 DIAGNOSIS — E1129 Type 2 diabetes mellitus with other diabetic kidney complication: Secondary | ICD-10-CM | POA: Diagnosis not present

## 2017-01-06 DIAGNOSIS — E1129 Type 2 diabetes mellitus with other diabetic kidney complication: Secondary | ICD-10-CM | POA: Diagnosis not present

## 2017-01-06 DIAGNOSIS — N186 End stage renal disease: Secondary | ICD-10-CM | POA: Diagnosis not present

## 2017-01-06 DIAGNOSIS — D509 Iron deficiency anemia, unspecified: Secondary | ICD-10-CM | POA: Diagnosis not present

## 2017-01-06 DIAGNOSIS — N2581 Secondary hyperparathyroidism of renal origin: Secondary | ICD-10-CM | POA: Diagnosis not present

## 2017-01-06 DIAGNOSIS — D631 Anemia in chronic kidney disease: Secondary | ICD-10-CM | POA: Diagnosis not present

## 2017-01-08 DIAGNOSIS — D631 Anemia in chronic kidney disease: Secondary | ICD-10-CM | POA: Diagnosis not present

## 2017-01-08 DIAGNOSIS — D509 Iron deficiency anemia, unspecified: Secondary | ICD-10-CM | POA: Diagnosis not present

## 2017-01-08 DIAGNOSIS — E1122 Type 2 diabetes mellitus with diabetic chronic kidney disease: Secondary | ICD-10-CM | POA: Diagnosis not present

## 2017-01-08 DIAGNOSIS — N186 End stage renal disease: Secondary | ICD-10-CM | POA: Diagnosis not present

## 2017-01-08 DIAGNOSIS — E1129 Type 2 diabetes mellitus with other diabetic kidney complication: Secondary | ICD-10-CM | POA: Diagnosis not present

## 2017-01-08 DIAGNOSIS — Z992 Dependence on renal dialysis: Secondary | ICD-10-CM | POA: Diagnosis not present

## 2017-01-08 DIAGNOSIS — N2581 Secondary hyperparathyroidism of renal origin: Secondary | ICD-10-CM | POA: Diagnosis not present

## 2017-01-19 ENCOUNTER — Emergency Department (HOSPITAL_COMMUNITY): Payer: Medicare Other

## 2017-01-19 ENCOUNTER — Emergency Department (HOSPITAL_COMMUNITY)
Admission: EM | Admit: 2017-01-19 | Discharge: 2017-01-19 | Disposition: A | Discharge status: Home / Self Care | Payer: Medicare Other | Source: Home / Self Care | Attending: Emergency Medicine | Admitting: Emergency Medicine

## 2017-01-19 DIAGNOSIS — I120 Hypertensive chronic kidney disease with stage 5 chronic kidney disease or end stage renal disease: Secondary | ICD-10-CM

## 2017-01-19 DIAGNOSIS — N186 End stage renal disease: Secondary | ICD-10-CM

## 2017-01-19 DIAGNOSIS — E1122 Type 2 diabetes mellitus with diabetic chronic kidney disease: Secondary | ICD-10-CM | POA: Insufficient documentation

## 2017-01-19 DIAGNOSIS — Y9301 Activity, walking, marching and hiking: Secondary | ICD-10-CM | POA: Insufficient documentation

## 2017-01-19 DIAGNOSIS — Y929 Unspecified place or not applicable: Secondary | ICD-10-CM | POA: Insufficient documentation

## 2017-01-19 DIAGNOSIS — Z96611 Presence of right artificial shoulder joint: Secondary | ICD-10-CM

## 2017-01-19 DIAGNOSIS — Z992 Dependence on renal dialysis: Secondary | ICD-10-CM

## 2017-01-19 DIAGNOSIS — Z96642 Presence of left artificial hip joint: Secondary | ICD-10-CM | POA: Insufficient documentation

## 2017-01-19 DIAGNOSIS — M25572 Pain in left ankle and joints of left foot: Secondary | ICD-10-CM

## 2017-01-19 DIAGNOSIS — Z79899 Other long term (current) drug therapy: Secondary | ICD-10-CM | POA: Insufficient documentation

## 2017-01-19 DIAGNOSIS — Y998 Other external cause status: Secondary | ICD-10-CM

## 2017-01-19 DIAGNOSIS — S82832A Other fracture of upper and lower end of left fibula, initial encounter for closed fracture: Secondary | ICD-10-CM

## 2017-01-19 DIAGNOSIS — Z96641 Presence of right artificial hip joint: Secondary | ICD-10-CM

## 2017-01-19 DIAGNOSIS — Z7984 Long term (current) use of oral hypoglycemic drugs: Secondary | ICD-10-CM

## 2017-01-19 DIAGNOSIS — F17210 Nicotine dependence, cigarettes, uncomplicated: Secondary | ICD-10-CM

## 2017-01-19 DIAGNOSIS — A419 Sepsis, unspecified organism: Secondary | ICD-10-CM | POA: Diagnosis not present

## 2017-01-19 DIAGNOSIS — W010XXA Fall on same level from slipping, tripping and stumbling without subsequent striking against object, initial encounter: Secondary | ICD-10-CM

## 2017-01-19 DIAGNOSIS — S99912A Unspecified injury of left ankle, initial encounter: Secondary | ICD-10-CM | POA: Diagnosis not present

## 2017-01-19 DIAGNOSIS — Z8546 Personal history of malignant neoplasm of prostate: Secondary | ICD-10-CM

## 2017-01-19 DIAGNOSIS — J449 Chronic obstructive pulmonary disease, unspecified: Secondary | ICD-10-CM | POA: Insufficient documentation

## 2017-01-19 DIAGNOSIS — M7989 Other specified soft tissue disorders: Secondary | ICD-10-CM | POA: Diagnosis not present

## 2017-01-19 DIAGNOSIS — E875 Hyperkalemia: Secondary | ICD-10-CM | POA: Diagnosis not present

## 2017-01-19 DIAGNOSIS — R4182 Altered mental status, unspecified: Secondary | ICD-10-CM | POA: Diagnosis not present

## 2017-01-19 MED ORDER — ACETAMINOPHEN 500 MG PO TABS: 500.0000 mg | ORAL_TABLET | Freq: Four times a day (QID) | ORAL | 0 refills | Status: AC | PRN

## 2017-01-19 MED ORDER — ACETAMINOPHEN 325 MG PO TABS
650.0000 mg | ORAL_TABLET | Freq: Once | ORAL | Status: AC
  Administered 2017-01-19: 650 mg via ORAL
  Filled 2017-01-19: qty 2
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
## 2017-01-19 NOTE — ED Triage Notes (Signed)
Pt states he fell yesterday and twisted his left ankle. Pt states it is very painful to bare weight and has swelling to left ankle.

## 2017-01-19 NOTE — Progress Notes (Signed)
Orthopedic Tech Progress Note Patient Details:  [name] 04/02/1950 [id number]  Ortho Devices Type of Ortho Device: Crutches, Ace wrap, Post (short leg) splint Ortho Device/Splint Interventions: Application   Post Interventions Patient Tolerated: Well, Ambulated well Instructions Provided: Poper ambulation with device, Care of device   [name] 01/19/2017, 12:33 PM

## 2017-01-20 ENCOUNTER — Emergency Department (HOSPITAL_COMMUNITY): Payer: Medicare Other

## 2017-01-20 ENCOUNTER — Ambulatory Visit: Payer: Medicare Other | Admitting: Vascular Surgery

## 2017-01-20 ENCOUNTER — Inpatient Hospital Stay (HOSPITAL_COMMUNITY)
Admission: EM | Admit: 2017-01-20 | Discharge: 2017-02-03 | DRG: 871 | Disposition: A | Discharge status: Rehabilitation Facility | Payer: Medicare Other | Attending: Internal Medicine | Admitting: Internal Medicine

## 2017-01-20 ENCOUNTER — Encounter (HOSPITAL_COMMUNITY): Payer: Medicare Other

## 2017-01-20 ENCOUNTER — Other Ambulatory Visit: Payer: Self-pay

## 2017-01-20 DIAGNOSIS — S82832D Other fracture of upper and lower end of left fibula, subsequent encounter for closed fracture with routine healing: Secondary | ICD-10-CM | POA: Diagnosis not present

## 2017-01-20 DIAGNOSIS — F17210 Nicotine dependence, cigarettes, uncomplicated: Secondary | ICD-10-CM | POA: Diagnosis present

## 2017-01-20 DIAGNOSIS — Z992 Dependence on renal dialysis: Secondary | ICD-10-CM

## 2017-01-20 DIAGNOSIS — E785 Hyperlipidemia, unspecified: Secondary | ICD-10-CM | POA: Diagnosis present

## 2017-01-20 DIAGNOSIS — W010XXA Fall on same level from slipping, tripping and stumbling without subsequent striking against object, initial encounter: Secondary | ICD-10-CM | POA: Diagnosis present

## 2017-01-20 DIAGNOSIS — I120 Hypertensive chronic kidney disease with stage 5 chronic kidney disease or end stage renal disease: Secondary | ICD-10-CM | POA: Diagnosis not present

## 2017-01-20 DIAGNOSIS — J9690 Respiratory failure, unspecified, unspecified whether with hypoxia or hypercapnia: Secondary | ICD-10-CM | POA: Diagnosis not present

## 2017-01-20 DIAGNOSIS — N183 Chronic kidney disease, stage 3 unspecified: Secondary | ICD-10-CM

## 2017-01-20 DIAGNOSIS — G92 Toxic encephalopathy: Secondary | ICD-10-CM | POA: Diagnosis present

## 2017-01-20 DIAGNOSIS — Y92009 Unspecified place in unspecified non-institutional (private) residence as the place of occurrence of the external cause: Secondary | ICD-10-CM

## 2017-01-20 DIAGNOSIS — R6510 Systemic inflammatory response syndrome (SIRS) of non-infectious origin without acute organ dysfunction: Secondary | ICD-10-CM | POA: Diagnosis present

## 2017-01-20 DIAGNOSIS — E669 Obesity, unspecified: Secondary | ICD-10-CM | POA: Diagnosis not present

## 2017-01-20 DIAGNOSIS — E1129 Type 2 diabetes mellitus with other diabetic kidney complication: Secondary | ICD-10-CM

## 2017-01-20 DIAGNOSIS — G9340 Encephalopathy, unspecified: Secondary | ICD-10-CM | POA: Diagnosis not present

## 2017-01-20 DIAGNOSIS — R066 Hiccough: Secondary | ICD-10-CM | POA: Diagnosis present

## 2017-01-20 DIAGNOSIS — K219 Gastro-esophageal reflux disease without esophagitis: Secondary | ICD-10-CM | POA: Diagnosis present

## 2017-01-20 DIAGNOSIS — Z8546 Personal history of malignant neoplasm of prostate: Secondary | ICD-10-CM

## 2017-01-20 DIAGNOSIS — N390 Urinary tract infection, site not specified: Secondary | ICD-10-CM | POA: Diagnosis not present

## 2017-01-20 DIAGNOSIS — D631 Anemia in chronic kidney disease: Secondary | ICD-10-CM | POA: Diagnosis present

## 2017-01-20 DIAGNOSIS — D72829 Elevated white blood cell count, unspecified: Secondary | ICD-10-CM

## 2017-01-20 DIAGNOSIS — Z8249 Family history of ischemic heart disease and other diseases of the circulatory system: Secondary | ICD-10-CM

## 2017-01-20 DIAGNOSIS — R0902 Hypoxemia: Secondary | ICD-10-CM | POA: Diagnosis not present

## 2017-01-20 DIAGNOSIS — G4752 REM sleep behavior disorder: Secondary | ICD-10-CM | POA: Diagnosis not present

## 2017-01-20 DIAGNOSIS — S0990XA Unspecified injury of head, initial encounter: Secondary | ICD-10-CM | POA: Diagnosis not present

## 2017-01-20 DIAGNOSIS — S82831A Other fracture of upper and lower end of right fibula, initial encounter for closed fracture: Secondary | ICD-10-CM

## 2017-01-20 DIAGNOSIS — E7800 Pure hypercholesterolemia, unspecified: Secondary | ICD-10-CM | POA: Diagnosis present

## 2017-01-20 DIAGNOSIS — S82899A Other fracture of unspecified lower leg, initial encounter for closed fracture: Secondary | ICD-10-CM

## 2017-01-20 DIAGNOSIS — Z9079 Acquired absence of other genital organ(s): Secondary | ICD-10-CM | POA: Diagnosis not present

## 2017-01-20 DIAGNOSIS — N250 Renal osteodystrophy: Secondary | ICD-10-CM | POA: Diagnosis present

## 2017-01-20 DIAGNOSIS — J449 Chronic obstructive pulmonary disease, unspecified: Secondary | ICD-10-CM

## 2017-01-20 DIAGNOSIS — T148XXA Other injury of unspecified body region, initial encounter: Secondary | ICD-10-CM

## 2017-01-20 DIAGNOSIS — Z9115 Patient's noncompliance with renal dialysis: Secondary | ICD-10-CM

## 2017-01-20 DIAGNOSIS — S8290XD Unspecified fracture of unspecified lower leg, subsequent encounter for closed fracture with routine healing: Secondary | ICD-10-CM

## 2017-01-20 DIAGNOSIS — I161 Hypertensive emergency: Secondary | ICD-10-CM | POA: Diagnosis not present

## 2017-01-20 DIAGNOSIS — N185 Chronic kidney disease, stage 5: Secondary | ICD-10-CM

## 2017-01-20 DIAGNOSIS — E1122 Type 2 diabetes mellitus with diabetic chronic kidney disease: Secondary | ICD-10-CM | POA: Diagnosis not present

## 2017-01-20 DIAGNOSIS — G4733 Obstructive sleep apnea (adult) (pediatric): Secondary | ICD-10-CM | POA: Diagnosis present

## 2017-01-20 DIAGNOSIS — R1310 Dysphagia, unspecified: Secondary | ICD-10-CM | POA: Diagnosis not present

## 2017-01-20 DIAGNOSIS — Z96611 Presence of right artificial shoulder joint: Secondary | ICD-10-CM | POA: Diagnosis present

## 2017-01-20 DIAGNOSIS — A419 Sepsis, unspecified organism: Principal | ICD-10-CM | POA: Diagnosis present

## 2017-01-20 DIAGNOSIS — I160 Hypertensive urgency: Secondary | ICD-10-CM

## 2017-01-20 DIAGNOSIS — N2581 Secondary hyperparathyroidism of renal origin: Secondary | ICD-10-CM | POA: Diagnosis present

## 2017-01-20 DIAGNOSIS — E875 Hyperkalemia: Secondary | ICD-10-CM | POA: Diagnosis present

## 2017-01-20 DIAGNOSIS — Z96643 Presence of artificial hip joint, bilateral: Secondary | ICD-10-CM | POA: Diagnosis present

## 2017-01-20 DIAGNOSIS — D638 Anemia in other chronic diseases classified elsewhere: Secondary | ICD-10-CM | POA: Diagnosis not present

## 2017-01-20 DIAGNOSIS — S82832G Other fracture of upper and lower end of left fibula, subsequent encounter for closed fracture with delayed healing: Secondary | ICD-10-CM | POA: Diagnosis not present

## 2017-01-20 DIAGNOSIS — I10 Essential (primary) hypertension: Secondary | ICD-10-CM | POA: Diagnosis not present

## 2017-01-20 DIAGNOSIS — I1311 Hypertensive heart and chronic kidney disease without heart failure, with stage 5 chronic kidney disease, or end stage renal disease: Secondary | ICD-10-CM | POA: Diagnosis present

## 2017-01-20 DIAGNOSIS — G9341 Metabolic encephalopathy: Secondary | ICD-10-CM | POA: Diagnosis not present

## 2017-01-20 DIAGNOSIS — I674 Hypertensive encephalopathy: Secondary | ICD-10-CM | POA: Diagnosis not present

## 2017-01-20 DIAGNOSIS — G4750 Parasomnia, unspecified: Secondary | ICD-10-CM | POA: Diagnosis present

## 2017-01-20 DIAGNOSIS — S82831S Other fracture of upper and lower end of right fibula, sequela: Secondary | ICD-10-CM | POA: Diagnosis not present

## 2017-01-20 DIAGNOSIS — N186 End stage renal disease: Secondary | ICD-10-CM

## 2017-01-20 DIAGNOSIS — R4182 Altered mental status, unspecified: Secondary | ICD-10-CM | POA: Diagnosis not present

## 2017-01-20 DIAGNOSIS — R5381 Other malaise: Secondary | ICD-10-CM | POA: Diagnosis not present

## 2017-01-20 DIAGNOSIS — D7589 Other specified diseases of blood and blood-forming organs: Secondary | ICD-10-CM | POA: Diagnosis present

## 2017-01-20 DIAGNOSIS — R29810 Facial weakness: Secondary | ICD-10-CM | POA: Diagnosis not present

## 2017-01-20 DIAGNOSIS — S82832A Other fracture of upper and lower end of left fibula, initial encounter for closed fracture: Secondary | ICD-10-CM | POA: Diagnosis present

## 2017-01-20 DIAGNOSIS — S82832S Other fracture of upper and lower end of left fibula, sequela: Secondary | ICD-10-CM | POA: Diagnosis not present

## 2017-01-20 DIAGNOSIS — E1169 Type 2 diabetes mellitus with other specified complication: Secondary | ICD-10-CM | POA: Diagnosis not present

## 2017-01-20 DIAGNOSIS — J9601 Acute respiratory failure with hypoxia: Secondary | ICD-10-CM | POA: Diagnosis not present

## 2017-01-20 DIAGNOSIS — S82892G Other fracture of left lower leg, subsequent encounter for closed fracture with delayed healing: Secondary | ICD-10-CM | POA: Diagnosis not present

## 2017-01-20 LAB — COMPREHENSIVE METABOLIC PANEL
ALT: 15 U/L — ABNORMAL LOW (ref 17–63)
AST: 24 U/L (ref 15–41)
Albumin: 4.1 g/dL (ref 3.5–5.0)
Alkaline Phosphatase: 55 U/L (ref 38–126)
Anion gap: 17 — ABNORMAL HIGH (ref 5–15)
BUN: 86 mg/dL — ABNORMAL HIGH (ref 6–20)
CO2: 18 mmol/L — ABNORMAL LOW (ref 22–32)
Calcium: 9.9 mg/dL (ref 8.9–10.3)
Chloride: 104 mmol/L (ref 101–111)
Creatinine, Ser: 14.38 mg/dL — ABNORMAL HIGH (ref 0.61–1.24)
GFR calc Af Amer: 3 mL/min — ABNORMAL LOW (ref >60)
GFR calc non Af Amer: 3 mL/min — ABNORMAL LOW (ref >60)
Glucose, Bld: 97 mg/dL (ref 65–99)
Potassium: 6.8 mmol/L (ref 3.5–5.1)
Sodium: 139 mmol/L (ref 135–145)
Total Bilirubin: 1.1 mg/dL (ref 0.3–1.2)
Total Protein: 7.5 g/dL (ref 6.5–8.1)

## 2017-01-20 LAB — CBG MONITORING, ED: Glucose-Capillary: 102 mg/dL — ABNORMAL HIGH (ref 65–99)

## 2017-01-20 LAB — I-STAT CG4 LACTIC ACID, ED: LACTIC ACID, VENOUS: 1.45 mmol/L (ref 0.5–1.9)

## 2017-01-20 LAB — MAGNESIUM: Magnesium: 2.3 mg/dL (ref 1.7–2.4)

## 2017-01-20 MED ORDER — CALCITRIOL 0.25 MCG PO CAPS
2.0000 ug | ORAL_CAPSULE | ORAL | Status: DC
  Administered 2017-01-25 – 2017-02-03 (×5): 2 ug via ORAL
  Filled 2017-01-20 (×2): qty 8
  Filled 2017-01-20: qty 4
  Filled 2017-01-20: qty 8

## 2017-01-20 MED ORDER — LIDOCAINE HCL (PF) 1 % IJ SOLN
30.0000 mL | Freq: Once | INTRAMUSCULAR | Status: AC
  Administered 2017-01-20: 30 mL via INTRADERMAL
  Filled 2017-01-20: qty 30

## 2017-01-20 MED ORDER — IPRATROPIUM-ALBUTEROL 0.5-2.5 (3) MG/3ML IN SOLN: 3.0000 mL | RESPIRATORY_TRACT | Status: DC | PRN

## 2017-01-20 MED ORDER — ONDANSETRON HCL 4 MG/2ML IJ SOLN
4.0000 mg | Freq: Four times a day (QID) | INTRAMUSCULAR | Status: DC | PRN
  Administered 2017-01-21 – 2017-01-25 (×6): 4 mg via INTRAVENOUS
  Filled 2017-01-20 (×5): qty 2

## 2017-01-20 MED ORDER — HEPARIN SODIUM (PORCINE) 5000 UNIT/ML IJ SOLN
5000.0000 [IU] | Freq: Three times a day (TID) | INTRAMUSCULAR | Status: DC
  Administered 2017-01-21 – 2017-02-03 (×33): 5000 [IU] via SUBCUTANEOUS
  Filled 2017-01-20 (×38): qty 1

## 2017-01-20 MED ORDER — ACETAMINOPHEN 650 MG RE SUPP: 650.0000 mg | Freq: Four times a day (QID) | RECTAL | Status: DC | PRN

## 2017-01-20 MED ORDER — LORAZEPAM 2 MG/ML IJ SOLN
1.0000 mg | Freq: Once | INTRAMUSCULAR | Status: AC
  Administered 2017-01-20: 1 mg via INTRAVENOUS
  Filled 2017-01-20: qty 1

## 2017-01-20 MED ORDER — LABETALOL HCL 5 MG/ML IV SOLN
20.0000 mg | Freq: Once | INTRAVENOUS | Status: AC
  Administered 2017-01-20: 20 mg via INTRAVENOUS
  Filled 2017-01-20: qty 4

## 2017-01-20 MED ORDER — CINACALCET HCL 30 MG PO TABS
120.0000 mg | ORAL_TABLET | ORAL | Status: DC
  Administered 2017-01-25 – 2017-01-27 (×2): 120 mg via ORAL
  Filled 2017-01-20 (×3): qty 4

## 2017-01-20 MED ORDER — SODIUM BICARBONATE 8.4 % IV SOLN
50.0000 meq | Freq: Once | INTRAVENOUS | Status: AC
  Administered 2017-01-20: 50 meq via INTRAVENOUS
  Filled 2017-01-20: qty 50

## 2017-01-20 MED ORDER — DEXTROSE 50 % IV SOLN
50.0000 mL | Freq: Once | INTRAVENOUS | Status: AC
  Administered 2017-01-20: 50 mL via INTRAVENOUS
  Filled 2017-01-20: qty 50

## 2017-01-20 MED ORDER — INSULIN ASPART 100 UNIT/ML ~~LOC~~ SOLN
10.0000 [IU] | Freq: Once | SUBCUTANEOUS | Status: AC
  Administered 2017-01-20: 10 [IU] via INTRAVENOUS
  Filled 2017-01-20: qty 1

## 2017-01-20 MED ORDER — ACETAMINOPHEN 325 MG PO TABS
650.0000 mg | ORAL_TABLET | Freq: Four times a day (QID) | ORAL | Status: DC | PRN
  Administered 2017-01-24 – 2017-02-03 (×13): 650 mg via ORAL
  Filled 2017-01-20 (×13): qty 2

## 2017-01-20 MED ORDER — CALCIUM GLUCONATE 10 % IV SOLN
1.0000 g | Freq: Once | INTRAVENOUS | Status: AC
  Administered 2017-01-20: 1 g via INTRAVENOUS
  Filled 2017-01-20: qty 10

## 2017-01-20 MED ORDER — ONDANSETRON HCL 4 MG PO TABS: 4.0000 mg | ORAL_TABLET | Freq: Four times a day (QID) | ORAL | Status: DC | PRN

## 2017-01-20 NOTE — Consult Note (Signed)
Neurology Consultation  Reason for Consult: AMS Referring Physician: Dr Wilson Singer  CC: Altered mental status  History is obtained from: Chart  HPI: [name] is a 66 y.o. male past medical history of end-stage renal disease on dialysis Monday Wednesday Friday, diabetes, hypertension, hyperlipidemia, who was brought into the hospital for evaluation after having been found down by EMS as he did not show up for his dialysis.  He had dialysis on Friday but unsure if he went for his dialysis on Monday.  Unknown last known well. Patient was not answering questions appropriately although he was awake.  He was seen in the ER yesterday after sustaining a fall and fracturing distal left fibula and anterior aspect of the distal tibia.  The chart at that time did not mention any evidence of confusion so assuming last known normal was sometime yesterday.  Patient did not provide any history at this time.   LKW: Unclear tpa given?: no, unclear last known well Premorbid modified Rankin scale (mRS):1-2  ROS:  Unable to obtain due to altered mental status.   Past Medical History:  Diagnosis Date  . Arthritis    right hand  . Cancer [facility])    prostate  . Chronic kidney disease    Dialysis M/w/F, [facility] R[facility]  . COPD (chronic obstructive pulmonary disease) ([place])   . Diabetes mellitus    Type 2   . GERD (gastroesophageal reflux disease)   . High cholesterol   . Hypertension   . Shortness of breath dyspnea     Family History  Problem Relation Age of Onset  . Heart failure Mother   . CAD Father   . Prostate cancer Neg Hx   . Kidney cancer Neg Hx    Social History:   reports that he has been smoking cigarettes.  He has a 13.00 pack-year smoking history. he has never used smokeless tobacco. He reports that he does not drink alcohol or use drugs.  Medications  Current Facility-Administered Medications:  .  lidocaine (PF) (XYLOCAINE) 1 % injection 30 mL, 30 mL, Intradermal, Once, Virgel Manifold,  MD  Current Outpatient Medications:  .  acetaminophen (TYLENOL) 500 MG tablet, Take 1 tablet (500 mg total) by mouth every 6 (six) hours as needed for mild pain or moderate pain., Disp: 30 tablet, Rfl: 0 .  albuterol (PROVENTIL HFA;VENTOLIN HFA) 108 (90 Base) MCG/ACT inhaler, Inhale 1-2 puffs into the lungs every 6 (six) hours as needed for wheezing., Disp: 1 Inhaler, Rfl: 0 .  amLODipine (NORVASC) 10 MG tablet, Take 10 mg daily by mouth., Disp: , Rfl:  .  atorvastatin (LIPITOR) 20 MG tablet, Take 10 mg by mouth at bedtime. , Disp: , Rfl:  .  furosemide (LASIX) 20 MG tablet, Take 1 tablet (20 mg total) by mouth daily., Disp: 30 tablet, Rfl: 0 .  glipiZIDE (GLUCOTROL) 10 MG tablet, Take 0.5 tablets (5 mg total) by mouth daily before breakfast., Disp: , Rfl:  .  HYDROcodone-acetaminophen (NORCO/VICODIN) 5-325 MG tablet, Take 1 tablet by mouth every 6 (six) hours as needed for moderate pain., Disp: 8 tablet, Rfl: 0 .  lisinopril (PRINIVIL,ZESTRIL) 40 MG tablet, Take 40 mg daily by mouth., Disp: , Rfl:  .  naproxen sodium (ANAPROX) 220 MG tablet, Take 220 mg by mouth daily as needed (pain)., Disp: , Rfl:  .  Polyethyl Glycol-Propyl Glycol (SYSTANE) 0.4-0.3 % SOLN, Place 1 drop into both eyes daily as needed (dry eyes)., Disp: , Rfl:  .  sucroferric oxyhydroxide (VELPHORO)  500 MG chewable tablet, Chew 1,000 mg by mouth 2 (two) times daily with a meal., Disp: , Rfl:  .  tamsulosin (FLOMAX) 0.4 MG CAPS capsule, Take 0.4 mg by mouth daily., Disp: , Rfl:  .  Vitamin D, Ergocalciferol, (DRISDOL) 50000 units CAPS capsule, Take 50,000 Units once a week by mouth., Disp: , Rfl:   Exam: Current vital signs: BP (!) 182/97   Pulse 82   Temp 98.5 F (36.9 C) (Rectal)   Resp (!) 24   Ht 5\' 8"  (1.727 m)   Wt 95.7 kg (211 lb)   SpO2 96%   BMI 32.08 kg/m  Vital signs in last 24 hours: Temp:  [98.5 F (36.9 C)] 98.5 F (36.9 C) (12/12 1749) Pulse Rate:  [82-110] 82 (12/12 1930) Resp:  [24] 24 (12/12  1749) BP: (182-208)/(97-112) 182/97 (12/12 1930) SpO2:  [96 %-99 %] 96 % (12/12 1930) Weight:  [95.7 kg (211 lb)] 95.7 kg (211 lb) (12/12 1825)  GENERAL: Awake, alert in NAD HEENT: - Normocephalic and atraumatic, dry mm, no LN++, no Thyromegally LUNGS - Clear to auscultation bilaterally with no wheezes CV - S1S2 RRR, no m/r/g, equal pulses bilaterally. ABDOMEN - Soft, nontender, nondistended with normoactive BS Ext: warm, well perfused, intact peripheral pulses, 1+ bilateral pitting edema  NEURO:  Mental Status: Awake, does not answer any questions.  Intermittently says yes to some questions but clearly not purposeful. Language: speech is clear from whatever can be ascertained.  Naming, repetition, fluency, and comprehension is impaired Cranial Nerves: PERRL. EOMI, visual fields full to threat, no facial asymmetry,f Motor: Antigravity in all 4.  Asterixis present in both upper extremities.  Also has some restricted movements of full upper extremities versus myoclonic jerking. Tone: is normal and bulk is normal Sensation- Intact to noxious stim bilaterally Coordination: Did not perform Gait- deferred  Labs I have reviewed labs in epic and the results pertinent to this consultation are: BUN 86, creatinine 14.38, WBC 10.8, hemoglobin 11.2, GFR 3.  CBC    Component Value Date/Time   WBC 10.8 (H) 12/15/2016 1439   RBC 3.15 (L) 12/15/2016 1439   HGB 11.2 (L) 12/28/2016 0751   HCT 33.0 (L) 12/28/2016 0751   PLT 301 12/15/2016 1439   MCV 103.8 (H) 12/15/2016 1439   MCH 33.7 12/15/2016 1439   MCHC 32.4 12/15/2016 1439   RDW 15.6 (H) 12/15/2016 1439   LYMPHSABS 1.2 02/28/2016 1451   MONOABS 0.8 02/28/2016 1451   EOSABS 0.0 02/28/2016 1451   BASOSABS 0.0 02/28/2016 1451    CMP     Component Value Date/Time   NA 139 01/20/2017 1815   K PENDING 01/20/2017 1815   CL 104 01/20/2017 1815   CO2 18 (L) 01/20/2017 1815   GLUCOSE 97 01/20/2017 1815   BUN 86 (H) 01/20/2017 1815    CREATININE 14.38 (H) 01/20/2017 1815   CALCIUM 9.9 01/20/2017 1815   PROT 7.5 01/20/2017 1815   ALBUMIN 4.1 01/20/2017 1815   AST 24 01/20/2017 1815   ALT 15 (L) 01/20/2017 1815   ALKPHOS 55 01/20/2017 1815   BILITOT 1.1 01/20/2017 1815   GFRNONAA 3 (L) 01/20/2017 1815   GFRAA 3 (L) 01/20/2017 1815    Lipid Panel     Component Value Date/Time   CHOL 112 10/10/2009 1020   TRIG 275.0 (H) 10/10/2009 1020   HDL 26.20 (L) 10/10/2009 1020   CHOLHDL 4 10/10/2009 1020   VLDL 55.0 (H) 10/10/2009 1020   LDLDIRECT 58.7 10/10/2009 1020  Imaging I have reviewed the images obtained:  CT-scan of the brain shows no acute changes.  No bleed.  No evidence of evolving infarct.  Chronic small vessel disease noted.  Atrophy noted.  Assessment:  66 year old man with end-stage renal disease hypertension hyper lipidemia presenting to the ER after having been found down since he did not make it to his dialysis and EMS had to go check on him and found him down on the floor. His clinical exam is significant for significant hypertension on presentation, encephalopathy and is nonfocal for any weakness. Labs reveal uremia with creatinine of 14.8 and BUN of 86. Top of the differentials at this time is toxic metabolic encephalopathy in the setting of uremia.  Presentation not consistent with stroke.  Last known well unclear hence not a candidate for IV TPA and no focality on exam.  Exam not suggestive of large vessel occlusion stroke hence not a candidate for endovascular treatment.  Impression: Toxic metabolic encephalopathy Uremia Evaluate for rhabdomyolysis Evaluate for posterior reversible encephalopathy syndrome Evaluate for seizures secondary to metabolic derangements  Recommendations: -At this time, I would recommend correction of his toxic metabolic derangements. -I would recommend checking ammonia and CK levels as well. -I would recommend an MRI of the brain when he stable to evaluate for  PRES since he came in very hypertensive. -Routine EEG in the morning. -Maintain seizure precautions  -- Amie Portland, MD [facility] [phone] If 7pm to 7am, please call on call as listed on AMION.

## 2017-01-20 NOTE — ED Notes (Signed)
Pt brought back from MRI due to inability to stay still and that he had a BM while in MRI
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
## 2017-01-20 NOTE — ED Notes (Signed)
Patient transported to CT 

## 2017-01-20 NOTE — ED Notes (Signed)
IV team at bedside 
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
## 2017-01-20 NOTE — ED Notes (Signed)
Dr. Wilson Singer attempted x 3 for lab draw with ultrasound unsuccessful.
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
## 2017-01-21 ENCOUNTER — Inpatient Hospital Stay (HOSPITAL_COMMUNITY): Payer: Medicare Other

## 2017-01-21 DIAGNOSIS — G9341 Metabolic encephalopathy: Secondary | ICD-10-CM | POA: Diagnosis present

## 2017-01-21 DIAGNOSIS — D631 Anemia in chronic kidney disease: Secondary | ICD-10-CM | POA: Diagnosis present

## 2017-01-21 DIAGNOSIS — R6510 Systemic inflammatory response syndrome (SIRS) of non-infectious origin without acute organ dysfunction: Secondary | ICD-10-CM | POA: Diagnosis present

## 2017-01-21 DIAGNOSIS — I160 Hypertensive urgency: Secondary | ICD-10-CM | POA: Diagnosis present

## 2017-01-21 DIAGNOSIS — S82832A Other fracture of upper and lower end of left fibula, initial encounter for closed fracture: Secondary | ICD-10-CM | POA: Diagnosis present

## 2017-01-21 DIAGNOSIS — N185 Chronic kidney disease, stage 5: Secondary | ICD-10-CM

## 2017-01-21 LAB — CBC
HCT: 36.9 % — ABNORMAL LOW (ref 39.0–52.0)
Hemoglobin: 12.1 g/dL — ABNORMAL LOW (ref 13.0–17.0)
MCH: 34.6 pg — AB (ref 26.0–34.0)
MCHC: 32.8 g/dL (ref 30.0–36.0)
MCV: 105.4 fL — ABNORMAL HIGH (ref 78.0–100.0)
PLATELETS: 240 10*3/uL (ref 150–400)
RBC: 3.5 MIL/uL — AB (ref 4.22–5.81)
RDW: 15.7 % — ABNORMAL HIGH (ref 11.5–15.5)
WBC: 16.5 10*3/uL — AB (ref 4.0–10.5)

## 2017-01-21 LAB — BLOOD GAS, ARTERIAL
Acid-base deficit: 3.2 mmol/L — ABNORMAL HIGH (ref 0.0–2.0)
BICARBONATE: 20.7 mmol/L (ref 20.0–28.0)
DRAWN BY: 331001
O2 Saturation: 95.7 %
PATIENT TEMPERATURE: 98.5
PH ART: 7.404 (ref 7.350–7.450)
pCO2 arterial: 33.8 mmHg (ref 32.0–48.0)
pO2, Arterial: 82.4 mmHg — ABNORMAL LOW (ref 83.0–108.0)

## 2017-01-21 LAB — RENAL FUNCTION PANEL
ANION GAP: 17 — AB (ref 5–15)
Albumin: 3.7 g/dL (ref 3.5–5.0)
BUN: 63 mg/dL — ABNORMAL HIGH (ref 6–20)
CHLORIDE: 103 mmol/L (ref 101–111)
CO2: 19 mmol/L — AB (ref 22–32)
Calcium: 9.4 mg/dL (ref 8.9–10.3)
Creatinine, Ser: 11.13 mg/dL — ABNORMAL HIGH (ref 0.61–1.24)
GFR calc non Af Amer: 4 mL/min — ABNORMAL LOW (ref >60)
GFR, EST AFRICAN AMERICAN: 5 mL/min — AB (ref >60)
Glucose, Bld: 116 mg/dL — ABNORMAL HIGH (ref 65–99)
POTASSIUM: 4.9 mmol/L (ref 3.5–5.1)
Phosphorus: 6.5 mg/dL — ABNORMAL HIGH (ref 2.5–4.6)
Sodium: 139 mmol/L (ref 135–145)

## 2017-01-21 LAB — CBC WITH DIFFERENTIAL/PLATELET
BASOS PCT: 0 %
Basophils Absolute: 0 10*3/uL (ref 0.0–0.1)
EOS ABS: 0 10*3/uL (ref 0.0–0.7)
Eosinophils Relative: 0 %
HEMATOCRIT: 35 % — AB (ref 39.0–52.0)
HEMOGLOBIN: 11.8 g/dL — AB (ref 13.0–17.0)
Lymphocytes Relative: 5 %
Lymphs Abs: 0.8 10*3/uL (ref 0.7–4.0)
MCH: 35.1 pg — ABNORMAL HIGH (ref 26.0–34.0)
MCHC: 33.7 g/dL (ref 30.0–36.0)
MCV: 104.2 fL — ABNORMAL HIGH (ref 78.0–100.0)
Monocytes Absolute: 1.2 10*3/uL — ABNORMAL HIGH (ref 0.1–1.0)
Monocytes Relative: 8 %
NEUTROS ABS: 13.5 10*3/uL — AB (ref 1.7–7.7)
NEUTROS PCT: 87 %
Platelets: 279 10*3/uL (ref 150–400)
RBC: 3.36 MIL/uL — AB (ref 4.22–5.81)
RDW: 15.2 % (ref 11.5–15.5)
WBC: 15.5 10*3/uL — AB (ref 4.0–10.5)

## 2017-01-21 LAB — GLUCOSE, CAPILLARY
GLUCOSE-CAPILLARY: 112 mg/dL — AB (ref 65–99)
GLUCOSE-CAPILLARY: 128 mg/dL — AB (ref 65–99)
GLUCOSE-CAPILLARY: 105 mg/dL — AB (ref 65–99)
Glucose-Capillary: 130 mg/dL — ABNORMAL HIGH (ref 65–99)

## 2017-01-21 LAB — AMMONIA: Ammonia: 34 umol/L (ref 9–35)

## 2017-01-21 LAB — LACTIC ACID, PLASMA
Lactic Acid, Venous: 1 mmol/L (ref 0.5–1.9)
Lactic Acid, Venous: 1.4 mmol/L (ref 0.5–1.9)

## 2017-01-21 LAB — MRSA PCR SCREENING: MRSA BY PCR: NEGATIVE

## 2017-01-21 LAB — CK: CK TOTAL: 622 U/L — AB (ref 49–397)

## 2017-01-21 LAB — PROCALCITONIN: PROCALCITONIN: 0.9 ng/mL

## 2017-01-21 LAB — VITAMIN B12: Vitamin B-12: 245 pg/mL (ref 180–914)

## 2017-01-21 LAB — PHOSPHORUS: PHOSPHORUS: 6.8 mg/dL — AB (ref 2.5–4.6)

## 2017-01-21 MED ORDER — PIPERACILLIN-TAZOBACTAM 3.375 G IVPB 30 MIN: 3.3750 g | Freq: Once | INTRAVENOUS | Status: DC

## 2017-01-21 MED ORDER — ALTEPLASE 2 MG IJ SOLR
INTRAMUSCULAR | Status: AC
  Filled 2017-01-21: qty 4

## 2017-01-21 MED ORDER — LABETALOL HCL 5 MG/ML IV SOLN
20.0000 mg | Freq: Once | INTRAVENOUS | Status: AC
  Administered 2017-01-21: 20 mg via INTRAVENOUS
  Filled 2017-01-21: qty 4

## 2017-01-21 MED ORDER — ALTEPLASE 2 MG IJ SOLR: 4.0000 mg | Freq: Once | INTRAMUSCULAR | Status: DC

## 2017-01-21 MED ORDER — PIPERACILLIN-TAZOBACTAM 3.375 G IVPB
3.3750 g | Freq: Two times a day (BID) | INTRAVENOUS | Status: DC
  Administered 2017-01-21 – 2017-01-28 (×14): 3.375 g via INTRAVENOUS
  Filled 2017-01-21 (×16): qty 50

## 2017-01-21 MED ORDER — LIDOCAINE HCL (PF) 1 % IJ SOLN: 5.0000 mL | INTRAMUSCULAR | Status: DC | PRN

## 2017-01-21 MED ORDER — SODIUM CHLORIDE 0.9 % IV SOLN: 100.0000 mL | INTRAVENOUS | Status: DC | PRN

## 2017-01-21 MED ORDER — VANCOMYCIN HCL IN DEXTROSE 1-5 GM/200ML-% IV SOLN
1000.0000 mg | INTRAVENOUS | Status: DC
  Administered 2017-01-22: 1000 mg via INTRAVENOUS
  Filled 2017-01-21: qty 200

## 2017-01-21 MED ORDER — VANCOMYCIN HCL IN DEXTROSE 1-5 GM/200ML-% IV SOLN: 1000.0000 mg | Freq: Once | INTRAVENOUS | Status: DC

## 2017-01-21 MED ORDER — VANCOMYCIN HCL 10 G IV SOLR
2000.0000 mg | Freq: Once | INTRAVENOUS | Status: AC
  Administered 2017-01-21: 2000 mg via INTRAVENOUS
  Filled 2017-01-21: qty 2000

## 2017-01-21 MED ORDER — NALOXONE HCL 0.4 MG/ML IJ SOLN
INTRAMUSCULAR | Status: AC
  Administered 2017-01-21: 0.4 mg
  Filled 2017-01-21: qty 1

## 2017-01-21 MED ORDER — HYDRALAZINE HCL 20 MG/ML IJ SOLN
20.0000 mg | Freq: Once | INTRAMUSCULAR | Status: AC
  Administered 2017-01-21: 20 mg via INTRAVENOUS
  Filled 2017-01-21: qty 1

## 2017-01-21 MED ORDER — HEPARIN SODIUM (PORCINE) 1000 UNIT/ML DIALYSIS
1000.0000 [IU] | INTRAMUSCULAR | Status: DC | PRN
  Filled 2017-01-21: qty 1

## 2017-01-21 MED ORDER — HYDRALAZINE HCL 20 MG/ML IJ SOLN: 10.0000 mg | Freq: Once | INTRAMUSCULAR | Status: DC

## 2017-01-21 MED ORDER — INSULIN ASPART 100 UNIT/ML ~~LOC~~ SOLN
0.0000 [IU] | SUBCUTANEOUS | Status: DC
  Administered 2017-01-21 – 2017-01-23 (×3): 2 [IU] via SUBCUTANEOUS
  Administered 2017-01-25: 3 [IU] via SUBCUTANEOUS

## 2017-01-21 MED ORDER — MORPHINE SULFATE (PF) 2 MG/ML IV SOLN
2.0000 mg | Freq: Three times a day (TID) | INTRAVENOUS | Status: DC | PRN
  Administered 2017-01-21 – 2017-01-23 (×3): 2 mg via INTRAVENOUS
  Filled 2017-01-21 (×3): qty 1

## 2017-01-21 MED ORDER — LIDOCAINE-PRILOCAINE 2.5-2.5 % EX CREA
1.0000 "application " | TOPICAL_CREAM | CUTANEOUS | Status: DC | PRN
  Filled 2017-01-21: qty 5

## 2017-01-21 MED ORDER — ALTEPLASE 2 MG IJ SOLR: 2.0000 mg | Freq: Once | INTRAMUSCULAR | Status: DC | PRN

## 2017-01-21 MED ORDER — HEPARIN SODIUM (PORCINE) 1000 UNIT/ML DIALYSIS
8600.0000 [IU] | Freq: Once | INTRAMUSCULAR | Status: DC
  Filled 2017-01-21: qty 9

## 2017-01-21 MED ORDER — MORPHINE SULFATE (PF) 2 MG/ML IV SOLN: 2.0000 mg | Freq: Three times a day (TID) | INTRAVENOUS | Status: DC | PRN

## 2017-01-21 MED ORDER — PENTAFLUOROPROP-TETRAFLUOROETH EX AERO: 1.0000 "application " | INHALATION_SPRAY | CUTANEOUS | Status: DC | PRN

## 2017-01-21 MED ORDER — HYDRALAZINE HCL 20 MG/ML IJ SOLN
10.0000 mg | INTRAMUSCULAR | Status: DC | PRN
  Administered 2017-01-21 – 2017-01-22 (×5): 10 mg via INTRAVENOUS
  Filled 2017-01-21 (×5): qty 1

## 2017-01-21 NOTE — Progress Notes (Signed)
EEG complete - results pending 

## 2017-01-21 NOTE — ED Notes (Signed)
IV insertion attempted x 2 by this nurse, x 2 by IV team. Due to the inability to gain IV access Dr. Wilson Singer to place IO

## 2017-01-21 NOTE — Progress Notes (Signed)
Pharmacy Antibiotic Note  [name] is a 66 y.o. male admitted on 01/20/2017 with sepsis.  Pharmacy has been consulted for vancomycin and zosyn dosing.  WBC 15.5, LA 1.45, and afebrile. ESRD on HD MWF, last HD on Friday.   Plan: Vancomycin 2g IV x1, then future doses based on HD plan  Zosyn 3.375g IV q12hr  Monitor HD schedule, clinical picture, and culture data F/u length of therapy   Height: 5\' 8"  (172.7 cm) Weight: 211 lb (95.7 kg) IBW/kg (Calculated) : 68.4  Temp (24hrs), Avg:98.5 F (36.9 C), Min:98.5 F (36.9 C), Max:98.5 F (36.9 C)  Recent Labs  Lab 01/20/17 1815 01/20/17 1825 01/20/17 2252  WBC  --   --  15.5*  CREATININE 14.38*  --   --   LATICACIDVEN  --  1.45  --     Estimated Creatinine Clearance: 5.5 mL/min (A) (by C-G formula based on SCr of 14.38 mg/dL (H)).    Allergies  Allergen Reactions  . Pravastatin Other (See Comments)    MYALGIAS, MUSCLE PAIN  . Simvastatin Other (See Comments)    MYALGIAS, MUSCLE PAIN   Antimicrobials this admission: 12/13 Vanc >>  12/13 Zosyn >>   Microbiology results: pending   Lavonda Jumbo, PharmD Clinical Pharmacist 01/21/17 2:44 AM

## 2017-01-21 NOTE — Progress Notes (Signed)
Pharmacy Antibiotic Note  [name] is a 66 y.o. male admitted on 01/20/2017 with sepsis.  Pharmacy has been consulted for vancomycin and zosyn dosing. ESRD with HD on 12/13 and plans for HD 12/14 WBC 16.5, and afebrile, cultures pending  Plan: Vancomycin 1000mg  IV MWF Zosyn 3.375g IV q12hr  Monitor HD schedule, clinical picture, and culture data F/u length of therapy   Height: 5\' 8"  (172.7 cm) Weight: 206 lb 9.1 oz (93.7 kg) IBW/kg (Calculated) : 68.4  Temp (24hrs), Avg:98.6 F (37 C), Min:98.3 F (36.8 C), Max:99.2 F (37.3 C)  Recent Labs  Lab 01/20/17 1815 01/20/17 1825 01/20/17 2252 01/21/17 1013  WBC  --   --  15.5* 16.5*  CREATININE 14.38*  --   --  11.13*  LATICACIDVEN  --  1.45  --   --     Estimated Creatinine Clearance: 7.1 mL/min (A) (by C-G formula based on SCr of 11.13 mg/dL (H)).    Allergies  Allergen Reactions  . Pravastatin Other (See Comments)    MYALGIAS, MUSCLE PAIN  . Simvastatin Other (See Comments)    MYALGIAS, MUSCLE PAIN   Antimicrobials this admission: 12/13 Vanc >>  12/13 Zosyn >>   Microbiology results:  12/13 blood x2   Hildred Laser, Pharm D 01/21/2017 1:16 PM

## 2017-01-21 NOTE — ED Notes (Signed)
IV team at bedside 

## 2017-01-21 NOTE — Progress Notes (Signed)
PROGRESS NOTE  KLAY SOBOTKA CBJ:[id number] DOB: April 29, 1950 DOA: 01/20/2017 PCP: [facility]  HPI/Recap of past 24 hours: HPI from Dr Fuller Plan on 01/20/17   [name] a 66 y.o.malewith medical history significant ofESRD on HD(MWF), HTN, DM, HLD; who presents after being found down at his home when he did not show up for his routine scheduled dialysis.  Patient's daughter provided history as he is acutely altered.  She reports receiving multiple calls from hemodialysis today stating that the patient missed his appointment.  She went over to his home to check on him and found him on the floor minimally responsive.Patient was seen in the emergency department on 01/19/17 after suffering a fall found to have a fracture to the distal end of his left fibula. It appears he received a cast and was sent home with pain medications of Tylenol to follow-up with orthopedics in the outpatient setting. Upon admission to the ED, patient was seen to be afebrile pulse 82-110, respirations 24-27, blood pressure 179/97-208/97, and O2 saturations 96-99% on room air. Labs revealed potassium 6.8, BUN 86, creatinine 14.38, anion gap 17.  Chest x-ray showed no acute abnormalities.  CT scan of the brain showed no acute intracranial findings.  EKG showed signs of T wave peaking.  Patient received 1 g of calcium gluconate, 50 mL of dextrose 50% solution, amp of sodium bicarb, and 10 units of insulin on the ED. Neurology was consulted due to patient's altered mental status. Nephrology was also consulted for need of dialysis. Pt admitted for further management  Today, saw pt after HD, still altered, very lethargic, easily arousable, unable to follow command. Unable to perform ROS as pt was altered. Noted to have some jerks/hiccups. Rapid response was called due as pt was not to have abdominal breathing. EKG showed no acute ischemic changes, 0.4mg  Narcan was given, ABG and EEG ws  done   Assessment/Plan: Principal Problem:   Hyperkalemia Active Problems:   Diabetes mellitus (HCC)   End stage renal disease on dialysis (HCC)   SIRS (systemic inflammatory response syndrome) (HCC)   Hypertensive urgency   Closed fracture of distal end of left fibula   Anemia due to chronic kidney disease   Acute metabolic encephalopathy  #Acute toxic metabolic encephalopathy:  Unclear etiology, likely due to Uremia Vs infection Vs medication Vs PRES Vs Seizures CT scan: Atrophy and small vessel disease. No acute intracranial findings MRI showed: No acute intracranial abnormality. Moderate chronic small vessel ischemic disease CXR no active disease Afebrile, with leukocytosis BC x 2 pending, LA WNL, Procalcitonin 0.9 EEG pending result Ammonia 34, CPK 622 ABG: PH 7.4, PCO2 33.8, PO2 82.4, HCO3 20.7 Neurochecks NPO, until more alert and able to follow commands Neurology on board, appreciate recs  #SIRS/Sepsis:  Afebrile, with leukocytosis, ?? source - Sepsis protocol initiated - Blood cultures pending, LA WNL, procalcitonin 0.9 - Empiric antibiotics IV Vanco and Zosyn started Daily CBC  #End-stage renal disease on HD:  Missed a couple of session due to weather Monday/Wednesday/Friday schedule Presented with potassium 6.8, BUN 86, and creatinine 14.38, not overloaded Continuous pulse oximetry with nasal cannula oxygen as needed Nephrology on board  #Hyperkalemia: Resolved Likely due to missed dialysis, 6.8 on admission.  EKG with signs of T wave peaking noted. Patient received 1 g of calcium gluconate, 50 mL of dextrose 50% solution, amp of sodium bicarb, and 10 units of insulin on the ED - Admit to a telemetry bed - Hemodialysis per  nephrology Daily BMP  #Hypertensive urgency/emergency: Blood pressures noted to be as high as 208/97 on admission.   - IV hydralazine as needed, until pt able to tolerate orally   #Anemia of chronic disease:  Hemoglobin noted to be  11.8 with elevated MCV and MCH - Check vitamin B12 and folate  #Diabetes mellitus type 2:  Last hemoglobin A1c noted to be 5.5 on 02/28/2016. Continue to CBG Q4H as pt is NPO  #Closed fracture distal end of left fibula 2/2 fall:  Called ortho tech for ACE wrap, splint Pain management with IV morphine  Patient was set up to be followed up in the outpatient setting with Dr. Fredonia Highland of orthopedics on 12/14, consider calling ortho if pt when pt is more stable     Code Status: Full  Family Communication: None at bedside  Disposition Plan: Once stable   Consultants:  Nephrology  Neurology  Procedures:  None  Antimicrobials:  IV Vanc + IV Zosyn  DVT prophylaxis:  [place] heparin   Objective: Vitals:   01/21/17 1800 01/21/17 1808 01/21/17 1900 01/21/17 1946  BP: (!) 170/101 (!) 187/101 (!) 160/97 (!) 153/81  Pulse: (!) 116 (!) 112 (!) 115 (!) 105  Resp: 20 20 (!) 24 (!) 23  Temp:    98.9 F (37.2 C)  TempSrc:    Oral  SpO2:   97% 94%  Weight:      Height:        Intake/Output Summary (Last 24 hours) at 01/21/2017 2010 Last data filed at 01/21/2017 1700 Gross per 24 hour  Intake 50 ml  Output 2000 ml  Net -1950 ml   Filed Weights   01/20/17 1825 01/21/17 0340 01/21/17 0700  Weight: 95.7 kg (211 lb) 95.7 kg (210 lb 15.7 oz) 93.7 kg (206 lb 9.1 oz)    Exam:   General: Lethargic, ill appearing, easily arousable, jerks/hiccups noted  Cardiovascular: S1-S2 present, no added heart sounds  Respiratory: Mild crackles at the bases  Abdomen: Soft, nontender, bowel sounds present, large midline scar noted  Musculoskeletal: No pedal edema bilaterally  Skin: Normal  Psychiatry: Unable to assess  Neuro: Moves all limbs, withdraws from pain, unable to follow commands   Data Reviewed: CBC: Recent Labs  Lab 01/20/17 2252 01/21/17 1013  WBC 15.5* 16.5*  NEUTROABS 13.5*  --   HGB 11.8* 12.1*  HCT 35.0* 36.9*  MCV 104.2* 105.4*  PLT 279 884   Basic  Metabolic Panel: Recent Labs  Lab 01/20/17 1815 01/20/17 2252 01/21/17 1013  NA 139  --  139  K 6.8*  --  4.9  CL 104  --  103  CO2 18*  --  19*  GLUCOSE 97  --  116*  BUN 86*  --  63*  CREATININE 14.38*  --  11.13*  CALCIUM 9.9  --  9.4  MG 2.3  --   --   PHOS  --  6.8* 6.5*   GFR: Estimated Creatinine Clearance: 7.1 mL/min (A) (by C-G formula based on SCr of 11.13 mg/dL (H)). Liver Function Tests: Recent Labs  Lab 01/20/17 1815 01/21/17 1013  AST 24  --   ALT 15*  --   ALKPHOS 55  --   BILITOT 1.1  --   PROT 7.5  --   ALBUMIN 4.1 3.7   No results for input(s): LIPASE, AMYLASE in the last 168 hours. Recent Labs  Lab 01/20/17 2252  AMMONIA 34   Coagulation Profile: No results for input(s): INR,  PROTIME in the last 168 hours. Cardiac Enzymes: Recent Labs  Lab 01/20/17 2252  CKTOTAL 622*   BNP (last 3 results) No results for input(s): PROBNP in the last 8760 hours. HbA1C: No results for input(s): HGBA1C in the last 72 hours. CBG: Recent Labs  Lab 01/20/17 1734 01/21/17 1211 01/21/17 1651 01/21/17 1957  GLUCAP 102* 112* 128* 105*   Lipid Profile: No results for input(s): CHOL, HDL, LDLCALC, TRIG, CHOLHDL, LDLDIRECT in the last 72 hours. Thyroid Function Tests: No results for input(s): TSH, T4TOTAL, FREET4, T3FREE, THYROIDAB in the last 72 hours. Anemia Panel: Recent Labs    01/21/17 1013  VITAMINB12 245   Urine analysis:    Component Value Date/Time   COLORURINE YELLOW 12/15/2016 1736   APPEARANCEUR CLEAR 12/15/2016 1736   LABSPEC 1.011 12/15/2016 1736   PHURINE 7.0 12/15/2016 1736   GLUCOSEU 50 (A) 12/15/2016 1736   HGBUR NEGATIVE 12/15/2016 1736   BILIRUBINUR NEGATIVE 12/15/2016 1736   KETONESUR NEGATIVE 12/15/2016 1736   PROTEINUR >=300 (A) 12/15/2016 1736   UROBILINOGEN 0.2 09/08/2007 1345   NITRITE NEGATIVE 12/15/2016 1736   LEUKOCYTESUR NEGATIVE 12/15/2016 1736   Sepsis Labs: @LABRCNTIP (procalcitonin:4,lacticidven:4)  )No  results found for this or any previous visit (from the past 240 hour(s)).    Studies: Mr Brain Wo Contrast  Result Date: 01/21/2017 CLINICAL DATA:  Altered mental status. End-stage renal disease on dialysis. Fall with distal fibula fracture 2 days ago. EXAM: MRI HEAD WITHOUT CONTRAST TECHNIQUE: Multiplanar, multiecho pulse sequences of the brain and surrounding structures were obtained without intravenous contrast. COMPARISON:  Head CT 01/20/2017 FINDINGS: There is up to moderate motion artifact throughout the examination. Brain: There is no evidence of acute infarct, intracranial hemorrhage, mass, midline shift, or extra-axial fluid collection. Patchy T2 hyperintensities throughout the subcortical and deep cerebral white matter are nonspecific but compatible with moderate chronic small vessel ischemic disease. There is mild cerebral atrophy. Vascular: Major intracranial vascular flow voids are preserved. Skull and upper cervical spine: Unremarkable bone marrow signal. Sinuses/Orbits: Unremarkable orbits. Paranasal sinuses and mastoid air cells are clear. Other: None. IMPRESSION: 1. No acute intracranial abnormality. 2. Moderate chronic small vessel ischemic disease. Electronically Signed   By: Logan Bores M.D.   On: 01/21/2017 12:11    Scheduled Meds: . alteplase  4 mg Intracatheter Once  . [START ON 01/22/2017] calcitRIOL  2 mcg Oral Q M,W,F  . [START ON 01/22/2017] cinacalcet  120 mg Oral Once per day on Mon Wed Fri  . heparin  5,000 Units Subcutaneous Q8H  . heparin  8,600 Units Dialysis Once in dialysis  . insulin aspart  0-15 Units Subcutaneous Q4H    Continuous Infusions: . sodium chloride    . sodium chloride    . piperacillin-tazobactam (ZOSYN)  IV 3.375 g (01/21/17 1638)  . [START ON 01/22/2017] vancomycin       LOS: 1 day     Alma Friendly, MD [facility]   If 7PM-7AM, please contact night-coverage [email or web address] Password [email or web address] 01/21/2017, 8:10 PM

## 2017-01-21 NOTE — Progress Notes (Signed)
Subjective: Patient is currently in the bed looking as though he is uncomfortable, hiccuping, having difficulty with breathing with notable wheezing standing next to him.  He is not following commands staring straightforward.  MRI has been obtained and did not show any abnormalities.  Looking at his labs he continues to have an elevated leukocytosis which continues to rise.  Primary physician was notified by nurse.  Stat EEG was ordered and EEG tech was called.  Exam: Vitals:   01/21/17 0842 01/21/17 0900  BP: (!) 181/124 (!) 176/101  Pulse:  (!) 115  Resp:  (!) 25  Temp:  98.5 F (36.9 C)  SpO2:  99%    HEENT-  Normocephalic, no lesions, without obvious abnormality.  Normal external eye and conjunctiva.  Normal TM's bilaterally.  Normal auditory canals and external ears. Normal external nose, mucus membranes and septum.  Normal pharynx. Cardiovascular- S1, S2 normal, pulses palpable throughout   Lungs- chest clear, no wheezing, rales, normal symmetric air entry Abdomen- normal findings: bowel sounds normal Extremities- no edema Lymph-no adenopathy palpable Musculoskeletal-no joint tenderness, deformity or swelling Skin-warm and dry, no hyperpigmentation, vitiligo, or suspicious lesions   Neuro:  CN: Pupils are equal and round. They are symmetrically reactive from 3-->2 mm. EOMI without nystagmus. Face is symmetric at rest with normal strength and mobility. Hearing is unable to be tested as he is not reacting to any vocal stimuli sensation.  And is following no commands.  Although he is satting at 96% he does not look as though he is protecting his airway well. Motor: Normal bulk, tone, and strength.  He is not moving his extremities antigravity spontaneously or to pain Sensation: No response to pain   Medications:  Scheduled: . alteplase      . alteplase  4 mg Intracatheter Once  . [START ON 01/22/2017] calcitRIOL  2 mcg Oral Q M,W,F  . [START ON 01/22/2017] cinacalcet  120 mg  Oral Once per day on Mon Wed Fri  . heparin  5,000 Units Subcutaneous Q8H  . heparin  8,600 Units Dialysis Once in dialysis  . insulin aspart  0-15 Units Subcutaneous Q4H    Pertinent Labs/Diagnostics: Folate, B12, renal function panel, blood cultures are all pending Ammonia 34  CK 622 White blood cell count 16.5 Patient remains afebrile with a most recent-99.2 MRI did not show any abnormal intracranial features and no press    Paul Quill PA-C [facility] [phone] On my exam, he does open his eyes and fixate and track across midline in both directions, withdraws all extremities to noxious stimulation.  Impression: 66 year old male with multiple medical problems and asterixis.  I continue to suspect toxic metabolic encephalopathy at this time and his improvement may lag behind his improvement in labs.  Recommendations: EEG Neurology will continue to follow  Paul Rack, MD [facility] [phone]  If 7pm- 7am, please page neurology on call as listed in [place].   01/21/2017, 10:49 AM

## 2017-01-21 NOTE — Progress Notes (Signed)
Orthopedic Tech Progress Note Patient Details:  [name] 05/20/1950 [id number]  Ortho Devices Type of Ortho Device: Ace wrap, Post (short leg) splint Ortho Device/Splint Location: lle Ortho Device/Splint Interventions: Application   Post Interventions Patient Tolerated: Well Instructions Provided: Care of device   [name] 01/21/2017, 1:53 PM As ordered by Dr. Horris Latino

## 2017-01-21 NOTE — ED Notes (Signed)
OK to give daughter updates via phone.

## 2017-01-21 NOTE — Significant Event (Signed)
Rapid Response Event Note  Overview: Time Called: 2244 Arrival Time: 9753 Event Type: Neurologic  Initial Focused Assessment: Patient easily arousable, but does not follow commands.   Lung sounds clear,  Heart tones regular Some abdominal breathing , hiccups BP 170/101  HR 105  RR 20  O2 sats 98% on RA Per MD patient admitted with metabolic encephalopathy. Per Neuro PA  MRI neg for PRESS and CVA [name] PA at bedside Dr Horris Latino at bedside  Interventions: 12 lead EKG done EEG 0.4mg  Narcan given IV,  No change in mental status ABG done  Plan of Care (if not transferred): RN to call if patient worsens. VS routine  Event Summary: Name of Physician Notified: [place] at 1243    at    Outcome: Stayed in room and stabalized     Raliegh Ip

## 2017-01-21 NOTE — Progress Notes (Signed)
[facility] Progress Note  Subjective: remains confused, MRI pending, had HD overnight with 2L off net. BP's remain high  Vitals:   01/21/17 0630 01/21/17 0700 01/21/17 0840 01/21/17 0842  BP: (!) 156/90 (!) 169/100 (!) 181/124 (!) 181/124  Pulse: (!) 111 (!) 107 (!) 115   Resp:  (!) 22 (!) 22   Temp:  98.3 F (36.8 C)    TempSrc:  Oral    SpO2:  100% 100%   Weight:  93.7 kg (206 lb 9.1 oz)    Height:        Inpatient medications: . alteplase      . alteplase  4 mg Intracatheter Once  . [START ON 01/22/2017] calcitRIOL  2 mcg Oral Q M,W,F  . [START ON 01/22/2017] cinacalcet  120 mg Oral Once per day on Mon Wed Fri  . heparin  5,000 Units Subcutaneous Q8H  . labetalol  20 mg Intravenous Once   . piperacillin-tazobactam (ZOSYN)  IV     acetaminophen **OR** acetaminophen, hydrALAZINE, ipratropium-albuterol, ondansetron **OR** ondansetron (ZOFRAN) IV  Exam: Awakens but not speaking, grunts in response to simple questions, lethargic, no jerking or seizure activity noted JVD+ Chest occ crackles at bases, o/ w clear Cor RRR tachy Abd soft large midline scars well healed, no ascites, no hsm Ext IO R shin, no pitting edema Neuro not responding verbally, somnolent   Dialysis: :MWF [facility] 4h 80min    2/2.5 bath  95.5kg  R IJ cath/ R BC AVF (excision / repair of some scarred areas 11/05/16 Dr Donzetta Matters / on 11/19 Dr Scot Dock did percut venoplasty to long intraAVF narrowing, also 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 Altered mental status - poss uremic, vs PRES, infectious , other. For MRI today. Had HD overnight.  2. ESRD HD MWF.  Plan HD tomorrow.  3. MBD of CKD - cont meds when able to swallow  4. Anemia of CKD - Hb at goal 5. DM per primary 6. COPD 7. Volume - CXR clear , no vol on exam, under dry wt  Plan - as above   Kelly Splinter MD [place] pager [phone]   01/21/2017, 9:14 AM   Recent Labs  Lab 01/20/17 1815  01/20/17 2252  NA 139  --   K 6.8*  --   CL 104  --   CO2 18*  --   GLUCOSE 97  --   BUN 86*  --   CREATININE 14.38*  --   CALCIUM 9.9  --   PHOS  --  6.8*   Recent Labs  Lab 01/20/17 1815  AST 24  ALT 15*  ALKPHOS 55  BILITOT 1.1  PROT 7.5  ALBUMIN 4.1   Recent Labs  Lab 01/20/17 2252  WBC 15.5*  NEUTROABS 13.5*  HGB 11.8*  HCT 35.0*  MCV 104.2*  PLT 279   Iron/TIBC/Ferritin/ %Sat    Component Value Date/Time   IRON 55 03/09/2015 0442   TIBC 244 (L) 03/09/2015 0442   FERRITIN 348 (H) 03/09/2015 0442   IRONPCTSAT 23 03/09/2015 0442

## 2017-01-21 NOTE — Procedures (Signed)
History: 66 year old male with encephalopathy  Sedation: None  Technique: This is a 21 channel routine scalp EEG performed at the bedside with bipolar and monopolar montages arranged in accordance to the international 10/20 system of electrode placement. One channel was dedicated to EKG recording.    Background: The background is poorly organized with high voltage irregular delta and theta activity.  There is a poorly sustained posterior dominant rhythm that she is a frequency of 8 Hz.  There are occasional triphasic waves seen.  Photic stimulation: Physiologic driving is not performed  EEG Abnormalities: 1) generalized irregular slow activity 2) triphasic waves    Clinical Interpretation: This EEG is consistent with a generalized nonspecific cerebral dysfunction(encephalopathy). There was no seizure or seizure predisposition recorded on this study. Please note that a normal EEG does not preclude the possibility of epilepsy.   Roland Rack, MD [facility] [phone]  If 7pm- 7am, please page neurology on call as listed in [place].

## 2017-01-22 LAB — FOLATE RBC
FOLATE, HEMOLYSATE: 270.6 ng/mL
Folate, RBC: 739 ng/mL (ref >498)
Hematocrit: 36.6 % — ABNORMAL LOW (ref 37.5–51.0)

## 2017-01-22 LAB — CBC WITH DIFFERENTIAL/PLATELET
BASOS ABS: 0 10*3/uL (ref 0.0–0.1)
Basophils Relative: 0 %
EOS PCT: 0 %
Eosinophils Absolute: 0.1 10*3/uL (ref 0.0–0.7)
HEMATOCRIT: 37 % — AB (ref 39.0–52.0)
Hemoglobin: 12.2 g/dL — ABNORMAL LOW (ref 13.0–17.0)
LYMPHS ABS: 1 10*3/uL (ref 0.7–4.0)
LYMPHS PCT: 6 %
MCH: 34.4 pg — AB (ref 26.0–34.0)
MCHC: 33 g/dL (ref 30.0–36.0)
MCV: 104.2 fL — AB (ref 78.0–100.0)
MONO ABS: 1.1 10*3/uL — AB (ref 0.1–1.0)
Monocytes Relative: 7 %
NEUTROS ABS: 13.8 10*3/uL — AB (ref 1.7–7.7)
Neutrophils Relative %: 87 %
PLATELETS: 246 10*3/uL (ref 150–400)
RBC: 3.55 MIL/uL — ABNORMAL LOW (ref 4.22–5.81)
RDW: 15.6 % — AB (ref 11.5–15.5)
WBC: 16 10*3/uL — ABNORMAL HIGH (ref 4.0–10.5)

## 2017-01-22 LAB — BASIC METABOLIC PANEL
ANION GAP: 17 — AB (ref 5–15)
ANION GAP: 17 — AB (ref 5–15)
BUN: 83 mg/dL — AB (ref 6–20)
BUN: 93 mg/dL — AB (ref 6–20)
CALCIUM: 9.4 mg/dL (ref 8.9–10.3)
CO2: 20 mmol/L — AB (ref 22–32)
CO2: 20 mmol/L — AB (ref 22–32)
Calcium: 9.6 mg/dL (ref 8.9–10.3)
Chloride: 102 mmol/L (ref 101–111)
Chloride: 102 mmol/L (ref 101–111)
Creatinine, Ser: 12.94 mg/dL — ABNORMAL HIGH (ref 0.61–1.24)
Creatinine, Ser: 14.01 mg/dL — ABNORMAL HIGH (ref 0.61–1.24)
GFR calc Af Amer: 4 mL/min — ABNORMAL LOW (ref >60)
GFR calc Af Amer: 4 mL/min — ABNORMAL LOW (ref >60)
GFR calc non Af Amer: 3 mL/min — ABNORMAL LOW (ref >60)
GFR calc non Af Amer: 3 mL/min — ABNORMAL LOW (ref >60)
GLUCOSE: 115 mg/dL — AB (ref 65–99)
GLUCOSE: 121 mg/dL — AB (ref 65–99)
POTASSIUM: 5.4 mmol/L — AB (ref 3.5–5.1)
POTASSIUM: 5.5 mmol/L — AB (ref 3.5–5.1)
Sodium: 139 mmol/L (ref 135–145)
Sodium: 139 mmol/L (ref 135–145)

## 2017-01-22 LAB — GLUCOSE, CAPILLARY
GLUCOSE-CAPILLARY: 116 mg/dL — AB (ref 65–99)
GLUCOSE-CAPILLARY: 103 mg/dL — AB (ref 65–99)
Glucose-Capillary: 102 mg/dL — ABNORMAL HIGH (ref 65–99)
Glucose-Capillary: 111 mg/dL — ABNORMAL HIGH (ref 65–99)

## 2017-01-22 LAB — COMPREHENSIVE METABOLIC PANEL
ALBUMIN: 3.6 g/dL (ref 3.5–5.0)
ALK PHOS: 42 U/L (ref 38–126)
ALT: 14 U/L — AB (ref 17–63)
ANION GAP: 21 — AB (ref 5–15)
AST: 26 U/L (ref 15–41)
BILIRUBIN TOTAL: 1.5 mg/dL — AB (ref 0.3–1.2)
BUN: 101 mg/dL — AB (ref 6–20)
CALCIUM: 9.7 mg/dL (ref 8.9–10.3)
CO2: 13 mmol/L — AB (ref 22–32)
Chloride: 107 mmol/L (ref 101–111)
Creatinine, Ser: 15.33 mg/dL — ABNORMAL HIGH (ref 0.61–1.24)
GFR calc Af Amer: 3 mL/min — ABNORMAL LOW (ref >60)
GFR calc non Af Amer: 3 mL/min — ABNORMAL LOW (ref >60)
GLUCOSE: 108 mg/dL — AB (ref 65–99)
Potassium: >7.5 mmol/L (ref 3.5–5.1)
SODIUM: 141 mmol/L (ref 135–145)
Total Protein: 6.9 g/dL (ref 6.5–8.1)

## 2017-01-22 LAB — POTASSIUM
Potassium: 4.2 mmol/L (ref 3.5–5.1)
Potassium: 4.8 mmol/L (ref 3.5–5.1)

## 2017-01-22 LAB — PROCALCITONIN: PROCALCITONIN: 1.15 ng/mL

## 2017-01-22 MED ORDER — ALTEPLASE 2 MG IJ SOLR: 2.0000 mg | Freq: Once | INTRAMUSCULAR | Status: DC | PRN

## 2017-01-22 MED ORDER — SODIUM CHLORIDE 0.9 % IV SOLN: 100.0000 mL | INTRAVENOUS | Status: DC | PRN

## 2017-01-22 MED ORDER — DEXTROSE 50 % IV SOLN: 1.0000 | Freq: Once | INTRAVENOUS | Status: DC

## 2017-01-22 MED ORDER — INSULIN ASPART 100 UNIT/ML IV SOLN: 10.0000 [IU] | Freq: Once | INTRAVENOUS | Status: DC

## 2017-01-22 MED ORDER — PENTAFLUOROPROP-TETRAFLUOROETH EX AERO: 1.0000 "application " | INHALATION_SPRAY | CUTANEOUS | Status: DC | PRN

## 2017-01-22 MED ORDER — CALCITRIOL 0.5 MCG PO CAPS
ORAL_CAPSULE | ORAL | Status: AC
  Filled 2017-01-22: qty 4

## 2017-01-22 MED ORDER — CALCIUM GLUCONATE 10 % IV SOLN: 1.0000 g | Freq: Once | INTRAVENOUS | Status: DC

## 2017-01-22 MED ORDER — VANCOMYCIN HCL IN DEXTROSE 1-5 GM/200ML-% IV SOLN
INTRAVENOUS | Status: AC
  Administered 2017-01-22: 1000 mg via INTRAVENOUS
  Filled 2017-01-22: qty 200

## 2017-01-22 MED ORDER — LIDOCAINE HCL (PF) 1 % IJ SOLN: 5.0000 mL | INTRAMUSCULAR | Status: DC | PRN

## 2017-01-22 MED ORDER — LIDOCAINE-PRILOCAINE 2.5-2.5 % EX CREA: 1.0000 "application " | TOPICAL_CREAM | CUTANEOUS | Status: DC | PRN

## 2017-01-22 MED ORDER — HEPARIN SODIUM (PORCINE) 1000 UNIT/ML DIALYSIS: 1000.0000 [IU] | INTRAMUSCULAR | Status: DC | PRN

## 2017-01-22 NOTE — Progress Notes (Addendum)
PROGRESS NOTE    [name]  WIO:[id number] DOB: [birth year] DOA: 01/20/2017 PCP: [facility]   Brief Narrative: 66 y.o.malewith medical history significant ofESRD on HD(MWF), HTN, DM, HLD;who presents after being found downat his homewhen he did not show up for his routine scheduled dialysis. Patient's daughter provided history as he is acutely altered. She reports receiving multiple calls from hemodialysis today stating that the patient missed his appointment. She went over to his home to check on him and found him on the floor minimally responsive.Patient was seen in the emergency department on 01/19/17 after suffering a fallfound to have a fracture tothe distal end of his left fibula. It appears he received a cast and was sent home with pain medications of Tylenol to follow-up with orthopedics in the outpatient setting. Upon admission to the ED, patient was seen to be afebrile pulse 82-110, respirations 24-27, blood pressure 179/97-208/97, and O2 saturations 96-99% on room air. Labs revealed potassium 6.8, BUN 86, creatinine 14.38, anion gap 17.Chest x-ray showed no acute abnormalities. CT scan of the brain showed no acute intracranial findings. EKG showed signs of T wave peaking. Patient received1 g of calcium gluconate, 50 mLofdextrose 50%solution,amp of sodium bicarb, and 10 units of insulin on the ED.Neurology was consulted due to patient's altered mental status. Nephrology was also consulted for need of dialysis. Pt admitted for further management  Today, saw pt after HD, still altered, very lethargic, easily arousable, unable to follow command. Unable to perform ROS as pt was altered. Noted to have some jerks/hiccups. Rapid response was called due as pt was not to have abdominal breathing. EKG showed no acute ischemic changes, 0.4mg  Narcan was given, ABG and EEG ws done  12/14- he is sleeping with hicuups.easily arousable but goes back to sleep right  away.  Assessment & Plan:   Principal Problem:   Hyperkalemia Active Problems:   Diabetes mellitus (HCC)   End stage renal disease on dialysis (HCC)   SIRS (systemic inflammatory response syndrome) (HCC)   Hypertensive urgency   Closed fracture of distal end of left fibula   Anemia due to chronic kidney disease   Acute metabolic encephalopathy  #Acute toxic metabolic encephalopathy: Unclear etiology, likely due to Uremia Vs infection Vs medication Vs PRES Vs Seizures CT scan: Atrophy and small vessel disease. No acute intracranial findings MRI showed: No acute intracranial abnormality. Moderate chronic small vessel ischemic disease CXR no active disease Afebrile, with leukocytosis BC x 2 pending, LA WNL, Procalcitonin 0.9 EEG pending result Ammonia 34, CPK 622 ABG: PH 7.4, PCO2 33.8, PO2 82.4, HCO3 20.7 Neurochecks NPO,until more alert and able to follow commands Neurology on board, appreciate recs  #SIRS/Sepsis: Afebrile, with leukocytosis, ?? source -Sepsis protocol initiated -Blood cultures pending, LA WNL, procalcitonin 0.9 -Empiric antibiotics IV Vanco and Zosyn started Daily CBC  #End-stage renal disease on HD: Missed a couple of session due to weather Monday/Wednesday/Friday schedule Presented with potassium 6.8,BUN 86, and creatinine 14.38, not overloaded Continuous pulse oximetry with nasal cannula oxygen as needed Nephrology on board. Hyperkalemia: Resolved Likely due to missed dialysis,above 7.5 today.will order calcium gluconate, 50 mLofdextrose 50%solution,amp of sodium bicarb, and insulin- - Hemodialysis per nephrology Daily BMP  #Hypertensive urgency/emergency: Blood pressures noted to be as high as 208/97 on admission.  -IV hydralazine as needed, until pt able to tolerate orally  #Anemia of chronic disease: Hemoglobin noted to be 11.8 with elevated MCV and MCH -Check vitamin B12 and folate  #Diabetes mellitus type 2:  Last  hemoglobin A1c noted to be 5.5 on 02/28/2016. Continue to CBG Q4H as pt is NPO  #Closed fracture distal end of left fibula2/27fall: Called ortho tech for ACE wrap, splint Pain management with IV morphine  Patient was set up to be followed up in the outpatient setting with Dr. Santo Held orthopedics on 12/14, consider calling ortho if pt when pt is more  Stable.   Objective:       Vitals:   01/21/17 1800 01/21/17 1808 01/21/17 1900 01/21/17 1946  BP: (!) 170/101 (!) 187/101 (!) 160/97 (!) 153/81  Pulse: (!) 116 (!) 112 (!) 115 (!) 105  Resp: 20 20 (!) 24 (!) 23  Temp:    98.9 F (37.2 C)  TempSrc:    Oral  SpO2:   97% 94%  Weight:      Height:        Intake/Output Summary (Last 24 hours) at 01/21/2017 2010 Last data filed at 01/21/2017 1700    Gross per 24 hour  Intake 50 ml  Output 2000 ml  Net -1950 ml        Filed Weights   01/20/17 1825 01/21/17 0340 01/21/17 0700  Weight: 95.7 kg (211 lb) 95.7 kg (210 lb 15.7 oz) 93.7 kg (206 lb 9.1 oz)     Exam:   General: Lethargic, ill appearing, easily arousable, jerks/hiccups noted  Cardiovascular: S1-S2 present, no added heart sounds  Respiratory: Mild crackles at the bases  Abdomen: Soft, nontender, bowel sounds present, large midline scar noted  Musculoskeletal: No pedal edema bilaterally  Skin: Normal  Psychiatry: Unable to assess  Neuro: Moves all limbs, withdraws from pain, unable to follow commands   Data Reviewed: CBC: LastLabs      Recent Labs  Lab 01/20/17 2252 01/21/17 1013  WBC 15.5* 16.5*  NEUTROABS 13.5*  --   HGB 11.8* 12.1*  HCT 35.0* 36.9*  MCV 104.2* 105.4*  PLT 279 240     Basic Metabolic Panel: LastLabs       Recent Labs  Lab 01/20/17 1815 01/20/17 2252 01/21/17 1013  NA 139  --  139  K 6.8*  --  4.9  CL 104  --  103  CO2 18*  --  19*  GLUCOSE 97  --  116*  BUN 86*  --  63*  CREATININE 14.38*  --  11.13*  CALCIUM 9.9  --  9.4    MG 2.3  --   --   PHOS  --  6.8* 6.5*     GFR: Estimated Creatinine Clearance: 7.1 mL/min (A) (by C-G formula based on SCr of 11.13 mg/dL (H)). Liver Function Tests: LastLabs  Recent Labs  Lab 01/20/17 1815 01/21/17 1013  AST 24  --   ALT 15*  --   ALKPHOS 55  --   BILITOT 1.1  --   PROT 7.5  --   ALBUMIN 4.1 3.7     LastLabs  No results for input(s): LIPASE, AMYLASE in the last 168 hours.   LastLabs     Recent Labs  Lab 01/20/17 2252  AMMONIA 34     Coagulation Profile: LastLabs  No results for input(s): INR, PROTIME in the last 168 hours.   Cardiac Enzymes: LastLabs     Recent Labs  Lab 01/20/17 2252  CKTOTAL 622*     BNP (last 3 results) RecentLabs(withinlast365days)  No results for input(s): PROBNP in the last 8760 hours.   HbA1C: RecentLabs(last2labs)  No results for input(s): HGBA1C in  the last 72 hours.   CBG: LastLabs        Recent Labs  Lab 01/20/17 1734 01/21/17 1211 01/21/17 1651 01/21/17 1957  GLUCAP 102* 112* 128* 105*     Lipid Profile: RecentLabs(last2labs)  No results for input(s): CHOL, HDL, LDLCALC, TRIG, CHOLHDL, LDLDIRECT in the last 72 hours.   Thyroid Function Tests: RecentLabs(last2labs)  No results for input(s): TSH, T4TOTAL, FREET4, T3FREE, THYROIDAB in the last 72 hours.   Anemia Panel: RecentLabs(last2labs)     Recent Labs    01/21/17 1013  VITAMINB12 245     Urine analysis: Labs(Brief)          Component Value Date/Time   COLORURINE YELLOW 12/15/2016 1736   APPEARANCEUR CLEAR 12/15/2016 1736   LABSPEC 1.011 12/15/2016 1736   PHURINE 7.0 12/15/2016 1736   GLUCOSEU 50 (A) 12/15/2016 1736   HGBUR NEGATIVE 12/15/2016 1736   BILIRUBINUR NEGATIVE 12/15/2016 1736   KETONESUR NEGATIVE 12/15/2016 1736   PROTEINUR >=300 (A) 12/15/2016 1736   UROBILINOGEN 0.2 09/08/2007 1345   NITRITE NEGATIVE 12/15/2016 1736   LEUKOCYTESUR NEGATIVE 12/15/2016  1736     Sepsis Labs: @LABRCNTIP (procalcitonin:4,lacticidven:4)  )No results found for this or any previous visit (from the past 240 hour(s)).    Studies: Mr Brain Wo Contrast  Result Date: 01/21/2017 CLINICAL DATA:  Altered mental status. End-stage renal disease on dialysis. Fall with distal fibula fracture 2 days ago. EXAM: MRI HEAD WITHOUT CONTRAST TECHNIQUE: Multiplanar, multiecho pulse sequences of the brain and surrounding structures were obtained without intravenous contrast. COMPARISON:  Head CT 01/20/2017 FINDINGS: There is up to moderate motion artifact throughout the examination. Brain: There is no evidence of acute infarct, intracranial hemorrhage, mass, midline shift, or extra-axial fluid collection. Patchy T2 hyperintensities throughout the subcortical and deep cerebral white matter are nonspecific but compatible with moderate chronic small vessel ischemic disease. There is mild cerebral atrophy. Vascular: Major intracranial vascular flow voids are preserved. Skull and upper cervical spine: Unremarkable bone marrow signal. Sinuses/Orbits: Unremarkable orbits. Paranasal sinuses and mastoid air cells are clear. Other: None. IMPRESSION: 1. No acute intracranial abnormality. 2. Moderate chronic small vessel ischemic disease. Electronically Signed   By: Logan Bores M.D.   On: 01/21/2017 12:11    Scheduled Meds: . alteplase  4 mg Intracatheter Once  . [START ON 01/22/2017] calcitRIOL  2 mcg Oral Q M,W,F  . [START ON 01/22/2017] cinacalcet  120 mg Oral Once per day on Mon Wed Fri  . heparin  5,000 Units Subcutaneous Q8H  . heparin  8,600 Units Dialysis Once in dialysis  . insulin aspart  0-15 Units Subcutaneous Q4H    Continuous Infusions: . sodium chloride    . sodium chloride    . piperacillin-tazobactam (ZOSYN)  IV 3.375 g (01/21/17 1638)  . [START ON 01/22/2017] vancomycin       LOS: 1 day     Alma Friendly, MD [facility]   If 7PM-7AM,  please contact night-coverage [email or web address] Password [email or web address] 01/21/2017, 8:10 PM                 DVT prophylaxis: Code Status: Family Communication: Disposition Plan:  Consultants:   Procedures: Antimicrobials:  Subjective:   Objective: Vitals:   01/22/17 1050 01/22/17 1052 01/22/17 1100 01/22/17 1130  BP: (!) 171/90 (!) 187/86 (!) 164/86   Pulse: 100 100 (!) 102 (!) 105  Resp: 18 18 16 17   Temp:      TempSrc:      SpO2: 97% 96% 95%  96%  Weight:      Height:        Intake/Output Summary (Last 24 hours) at 01/22/2017 1217 Last data filed at 01/22/2017 0513 Gross per 24 hour  Intake 100 ml  Output -  Net 100 ml   Filed Weights   01/20/17 1825 01/21/17 0340 01/21/17 0700  Weight: 95.7 kg (211 lb) 95.7 kg (210 lb 15.7 oz) 93.7 kg (206 lb 9.1 oz)    Examination:  General exam: Appears calm and comfortable  Respiratory system: Clear to auscultation. Respiratory effort normal. Cardiovascular system: S1 & S2 heard, RRR. No JVD, murmurs, rubs, gallops or clicks. No pedal edema. Gastrointestinal system: Abdomen is nondistended, soft and nontender. No organomegaly or masses felt. Normal bowel sounds heard. Central nervous system: Alert and oriented. No focal neurological deficits. Extremities: Symmetric 5 x 5 power. Skin: No rashes, lesions or ulcers Psychiatry: Judgement and insight appear normal. Mood & affect appropriate.     Data Reviewed: I have personally reviewed following labs and imaging studies  CBC: Recent Labs  Lab 01/20/17 2252 01/21/17 1013 01/22/17 0304  WBC 15.5* 16.5* 16.0*  NEUTROABS 13.5*  --  13.8*  HGB 11.8* 12.1* 12.2*  HCT 35.0* 36.9* 37.0*  MCV 104.2* 105.4* 104.2*  PLT 279 240 712   Basic Metabolic Panel: Recent Labs  Lab 01/20/17 1815 01/20/17 2252 01/21/17 1013 01/22/17 0304  NA 139  --  139 139  K 6.8*  --  4.9 5.4*  CL 104  --  103 102  CO2 18*  --  19* 20*  GLUCOSE 97  --  116* 115*  BUN 86*  --  63* 83*    CREATININE 14.38*  --  11.13* 12.94*  CALCIUM 9.9  --  9.4 9.6  MG 2.3  --   --   --   PHOS  --  6.8* 6.5*  --    GFR: Estimated Creatinine Clearance: 6.1 mL/min (A) (by C-G formula based on SCr of 12.94 mg/dL (H)). Liver Function Tests: Recent Labs  Lab 01/20/17 1815 01/21/17 1013  AST 24  --   ALT 15*  --   ALKPHOS 55  --   BILITOT 1.1  --   PROT 7.5  --   ALBUMIN 4.1 3.7   No results for input(s): LIPASE, AMYLASE in the last 168 hours. Recent Labs  Lab 01/20/17 2252  AMMONIA 34   Coagulation Profile: No results for input(s): INR, PROTIME in the last 168 hours. Cardiac Enzymes: Recent Labs  Lab 01/20/17 2252  CKTOTAL 622*   BNP (last 3 results) No results for input(s): PROBNP in the last 8760 hours. HbA1C: No results for input(s): HGBA1C in the last 72 hours. CBG: Recent Labs  Lab 01/21/17 1957 01/22/17 0003 01/22/17 0445 01/22/17 0808 01/22/17 1053  GLUCAP 105* 130* 102* 116* 111*   Lipid Profile: No results for input(s): CHOL, HDL, LDLCALC, TRIG, CHOLHDL, LDLDIRECT in the last 72 hours. Thyroid Function Tests: No results for input(s): TSH, T4TOTAL, FREET4, T3FREE, THYROIDAB in the last 72 hours. Anemia Panel: Recent Labs    01/21/17 1013  VITAMINB12 245   Sepsis Labs: Recent Labs  Lab 01/20/17 1825 01/21/17 1407 01/21/17 1846 01/22/17 0304  PROCALCITON  --  0.90  --  1.15  LATICACIDVEN 1.45 1.0 1.4  --     Recent Results (from the past 240 hour(s))  Culture, blood (x 2)     Status: None (Preliminary result)   Collection Time: 01/21/17  3:25 AM  Result Value Ref Range Status   Specimen Description BLOOD LEFT WRIST  Final   Special Requests   Final    BOTTLES DRAWN AEROBIC AND ANAEROBIC Blood Culture adequate volume   Culture NO GROWTH 1 DAY  Final   Report Status PENDING  Incomplete  Culture, blood (x 2)     Status: None (Preliminary result)   Collection Time: 01/21/17 10:10 AM  Result Value Ref Range Status   Specimen Description  BLOOD LEFT ARM  Final   Special Requests   Final    BOTTLES DRAWN AEROBIC AND ANAEROBIC Blood Culture results may not be optimal due to an excessive volume of blood received in culture bottles   Culture NO GROWTH 1 DAY  Final   Report Status PENDING  Incomplete  MRSA PCR Screening     Status: None   Collection Time: 01/21/17  4:24 PM  Result Value Ref Range Status   MRSA by PCR NEGATIVE NEGATIVE Final    Comment:        The GeneXpert MRSA Assay (FDA approved for NASAL specimens only), is one component of a comprehensive MRSA colonization surveillance program. It is not intended to diagnose MRSA infection nor to guide or monitor treatment for MRSA infections.          Radiology Studies: Ct Head Wo Contrast  Result Date: 01/20/2017 CLINICAL DATA:  Altered mental status, onset today. Patient not following commands. EXAM: CT HEAD WITHOUT CONTRAST TECHNIQUE: Contiguous axial images were obtained from the base of the skull through the vertex without intravenous contrast. COMPARISON:  None. FINDINGS: Brain: No evidence for acute infarction, hemorrhage, mass lesion, hydrocephalus, or extra-axial fluid. Advanced cerebral and cerebellar atrophy, premature for age. Hypoattenuation of white matter, favored to represent small vessel disease. Vascular: Calcification of the cavernous internal carotid arteries consistent with cerebrovascular atherosclerotic disease. No signs of intracranial large vessel occlusion. Skull: Calvarium intact. Sinuses/Orbits: No sinus disease.  Dense lenticular opacities. Other: None. IMPRESSION: Atrophy and small vessel disease. No acute intracranial findings. No cause is seen for the reported symptoms. Electronically Signed   By: Staci Righter M.D.   On: 01/20/2017 20:03   Mr Brain Wo Contrast  Result Date: 01/21/2017 CLINICAL DATA:  Altered mental status. End-stage renal disease on dialysis. Fall with distal fibula fracture 2 days ago. EXAM: MRI HEAD WITHOUT  CONTRAST TECHNIQUE: Multiplanar, multiecho pulse sequences of the brain and surrounding structures were obtained without intravenous contrast. COMPARISON:  Head CT 01/20/2017 FINDINGS: There is up to moderate motion artifact throughout the examination. Brain: There is no evidence of acute infarct, intracranial hemorrhage, mass, midline shift, or extra-axial fluid collection. Patchy T2 hyperintensities throughout the subcortical and deep cerebral white matter are nonspecific but compatible with moderate chronic small vessel ischemic disease. There is mild cerebral atrophy. Vascular: Major intracranial vascular flow voids are preserved. Skull and upper cervical spine: Unremarkable bone marrow signal. Sinuses/Orbits: Unremarkable orbits. Paranasal sinuses and mastoid air cells are clear. Other: None. IMPRESSION: 1. No acute intracranial abnormality. 2. Moderate chronic small vessel ischemic disease. Electronically Signed   By: Logan Bores M.D.   On: 01/21/2017 12:11   Dg Chest Portable 1 View  Result Date: 01/20/2017 CLINICAL DATA:  Altered mental status. Patient found lying on floor at home today pannus nonverbal to questions. Cough. EXAM: PORTABLE CHEST 1 VIEW COMPARISON:  03/02/2016 FINDINGS: Stable cardiomegaly. Dialysis catheter tip from a right IJ approach is noted in the proximal right atrium. No overt pulmonary edema. The costophrenic angles are  excluded on this study but no significant pleural effusion is seen. There is no pneumothorax. No pneumonic consolidation. No acute osseous abnormality. 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 IMPRESSION: No active disease. Electronically Signed   By: Ashley Royalty M.D.   On: 01/20/2017 18:39        Scheduled Meds: . alteplase  4 mg Intracatheter Once  . calcitRIOL  2 mcg Oral Q M,W,F  . cinacalcet  120 mg Oral Once per day on Mon Wed Fri  . heparin  5,000 Units Subcutaneous Q8H  . heparin  8,600 Units Dialysis Once in dialysis    . insulin aspart  0-15 Units Subcutaneous Q4H   Continuous Infusions: . sodium chloride    . sodium chloride    . piperacillin-tazobactam (ZOSYN)  IV Stopped (01/22/17 0900)  . vancomycin       LOS: 2 days      Georgette Shell, MD [facility]  If 7PM-7AM, please contact night-coverage [email or web address] Password [email or web address] 01/22/2017, 12:17 PM

## 2017-01-22 NOTE — Progress Notes (Signed)
CRITICAL VALUE ALERT  Critical Value:  Potassium >7.5  Date & Time Notied:  01/22/17 1305  Provider Notified: MD Rodena Piety  Orders Received/Actions taken: MD aware; notified dialysis

## 2017-01-22 NOTE — Progress Notes (Signed)
Patient received back from Dialysis current BP 138/78.  Temp 99.6, CGB 111.  Pt still tachycardic (HR 109) and presents with hiccups, mild expiratory wheezes.

## 2017-01-22 NOTE — Plan of Care (Signed)
  Skin Integrity: Risk for impaired skin integrity will decrease 01/22/2017 0243 - Progressing by Peggye Pitt, RN   Education: Knowledge of General Education information will improve 01/22/2017 0243 - Not Progressing by Peggye Pitt, RN

## 2017-01-22 NOTE — Progress Notes (Signed)
Subjective: Patient is more alert today, attempts to follow commands however remains significantly encephalopathic.  At one point he did raise his arm to command (left arm) however did not lift his right arm but when I did lift his right arm he did keep it up.  He did not take part in finger-nose.  When I did plantar stimulation he did withdraw his leg on the right and state ouch.  He did track me in the room.  Continues to have hiccups.  Exam: Vitals:   01/22/17 1050 01/22/17 1052  BP: (!) 171/90 (!) 187/86  Pulse: 100 100  Resp: 18 18  Temp:    SpO2: 97% 96%    HEENT-  Normocephalic, no lesions, without obvious abnormality.  Normal external eye and conjunctiva.  Normal TM's bilaterally.  Normal auditory canals and external ears. Normal external nose, mucus membranes and septum.  Normal pharynx. Cardiovascular- S1, S2 normal, pulses palpable throughout   Lungs- chest clear, no wheezing, rales, normal symmetric air entry Abdomen- normal findings: bowel sounds normal Extremities- no edema Lymph-no adenopathy palpable Musculoskeletal-no joint tenderness, deformity or swelling Skin-warm and dry, no hyperpigmentation, vitiligo, or suspicious lesions   Neuro:  CN: Pupils are equal and round. They are symmetrically reactive from 3-->2 mm. EOMI without nystagmus. Facial sensation is intact to light touch. Face is symmetric at rest with normal strength and mobility. Hearing is intact to conversational voice.  Tongue is midline with normal bulk and mobility.  Motor:  Withdrew from pain throughout.  Moving all extremities except for his left lower extremity which is in a cast antigravity Sensation: As above    Medications:  Scheduled: . alteplase  4 mg Intracatheter Once  . calcitRIOL  2 mcg Oral Q M,W,F  . cinacalcet  120 mg Oral Once per day on Mon Wed Fri  . heparin  5,000 Units Subcutaneous Q8H  . heparin  8,600 Units Dialysis Once in dialysis  . insulin aspart  0-15 Units Subcutaneous  Q4H    Pertinent Labs/Diagnostics: EEG Abnormalities: 1) generalized irregular slow activity 2) triphasic waves   CMP pending for today CBC shows white blood cell count to be 16  Mr Brain Wo Contrast  Result Date: 01/21/2017 CLINICAL DATA:  Altered mental status. End-stage renal disease on dialysis. Fall with distal fibula fracture 2 days ago. EXAM: MRI HEAD WITHOUT CONTRAST TECHNIQUE: Multiplanar, multiecho pulse sequences of the brain and surrounding structures were obtained without intravenous contrast. COMPARISON:  Head CT 01/20/2017 FINDINGS: There is up to moderate motion artifact throughout the examination. Brain: There is no evidence of acute infarct, intracranial hemorrhage, mass, midline shift, or extra-axial fluid collection. Patchy T2 hyperintensities throughout the subcortical and deep cerebral white matter are nonspecific but compatible with moderate chronic small vessel ischemic disease. There is mild cerebral atrophy. Vascular: Major intracranial vascular flow voids are preserved. Skull and upper cervical spine: Unremarkable bone marrow signal. Sinuses/Orbits: Unremarkable orbits. Paranasal sinuses and mastoid air cells are clear. Other: None. IMPRESSION: 1. No acute intracranial abnormality. 2. Moderate chronic small vessel ischemic disease. Electronically Signed   By: Logan Bores M.D.   On: 01/21/2017 12:11     Etta Quill PA-C [facility] [phone]  Impression: 66 year old male with multiple medical problems and asterixis.  I continue to suspect toxic metabolic encephalopathy at this time and his improvement may lag behind his improvement in labs. New CMP pending    Recommendations: 1) continue to treat underlying infectious/metabolic issues    48/18/5631, 11:10 AM

## 2017-01-22 NOTE — Procedures (Signed)
Pt more alert today, still confused though looking better.  HD today in progress.  No changes needed.   I was present at this dialysis session, have reviewed the session itself and made  appropriate changes Kelly Splinter MD [place] pager [phone]   01/22/2017, 5:00 PM

## 2017-01-23 DIAGNOSIS — G9341 Metabolic encephalopathy: Secondary | ICD-10-CM

## 2017-01-23 DIAGNOSIS — R1310 Dysphagia, unspecified: Secondary | ICD-10-CM

## 2017-01-23 DIAGNOSIS — R4182 Altered mental status, unspecified: Secondary | ICD-10-CM

## 2017-01-23 LAB — GLUCOSE, CAPILLARY
GLUCOSE-CAPILLARY: 104 mg/dL — AB (ref 65–99)
GLUCOSE-CAPILLARY: 104 mg/dL — AB (ref 65–99)
GLUCOSE-CAPILLARY: 146 mg/dL — AB (ref 65–99)
Glucose-Capillary: 99 mg/dL (ref 65–99)
Glucose-Capillary: 100 mg/dL — ABNORMAL HIGH (ref 65–99)
Glucose-Capillary: 94 mg/dL (ref 65–99)

## 2017-01-23 LAB — CBC WITH DIFFERENTIAL/PLATELET
BASOS ABS: 0 10*3/uL (ref 0.0–0.1)
BASOS PCT: 0 %
Eosinophils Absolute: 0 10*3/uL (ref 0.0–0.7)
Eosinophils Relative: 0 %
HEMATOCRIT: 38.2 % — AB (ref 39.0–52.0)
HEMOGLOBIN: 12.3 g/dL — AB (ref 13.0–17.0)
Lymphocytes Relative: 7 %
Lymphs Abs: 1 10*3/uL (ref 0.7–4.0)
MCH: 34.5 pg — ABNORMAL HIGH (ref 26.0–34.0)
MCHC: 32.2 g/dL (ref 30.0–36.0)
MCV: 107 fL — ABNORMAL HIGH (ref 78.0–100.0)
MONO ABS: 1.6 10*3/uL — AB (ref 0.1–1.0)
Monocytes Relative: 11 %
NEUTROS ABS: 12.6 10*3/uL — AB (ref 1.7–7.7)
NEUTROS PCT: 82 %
Platelets: 233 10*3/uL (ref 150–400)
RBC: 3.57 MIL/uL — AB (ref 4.22–5.81)
RDW: 15.5 % (ref 11.5–15.5)
WBC: 15.2 10*3/uL — AB (ref 4.0–10.5)

## 2017-01-23 LAB — BASIC METABOLIC PANEL
ANION GAP: 22 — AB (ref 5–15)
BUN: 59 mg/dL — ABNORMAL HIGH (ref 6–20)
CALCIUM: 9.2 mg/dL (ref 8.9–10.3)
CO2: 20 mmol/L — AB (ref 22–32)
Chloride: 97 mmol/L — ABNORMAL LOW (ref 101–111)
Creatinine, Ser: 9.94 mg/dL — ABNORMAL HIGH (ref 0.61–1.24)
GFR calc non Af Amer: 5 mL/min — ABNORMAL LOW (ref >60)
GFR, EST AFRICAN AMERICAN: 5 mL/min — AB (ref >60)
Glucose, Bld: 107 mg/dL — ABNORMAL HIGH (ref 65–99)
POTASSIUM: 5 mmol/L (ref 3.5–5.1)
Sodium: 139 mmol/L (ref 135–145)

## 2017-01-23 LAB — PROCALCITONIN: PROCALCITONIN: 1.31 ng/mL

## 2017-01-23 LAB — POTASSIUM
POTASSIUM: 4.6 mmol/L (ref 3.5–5.1)
Potassium: 4.8 mmol/L (ref 3.5–5.1)

## 2017-01-23 MED ORDER — CHLORPROMAZINE HCL 25 MG/ML IJ SOLN
25.0000 mg | Freq: Once | INTRAMUSCULAR | Status: DC
  Filled 2017-01-23: qty 1

## 2017-01-23 MED ORDER — PANTOPRAZOLE SODIUM 40 MG IV SOLR
40.0000 mg | Freq: Every day | INTRAVENOUS | Status: DC
  Administered 2017-01-23 – 2017-01-24 (×2): 40 mg via INTRAVENOUS
  Filled 2017-01-23 (×2): qty 40

## 2017-01-23 MED ORDER — CYANOCOBALAMIN 1000 MCG/ML IJ SOLN
1000.0000 ug | Freq: Once | INTRAMUSCULAR | Status: DC
  Filled 2017-01-23: qty 1

## 2017-01-23 NOTE — Progress Notes (Signed)
Patient had episode of brownish/clear emesis. Patient stated that he had vomited once before around shift change. This RN was unaware of that episode. zofran given

## 2017-01-23 NOTE — Progress Notes (Signed)
[facility] Progress Note  Subjective: confusion improved again today, still lethargic  Vitals:   01/22/17 1700 01/22/17 2139 01/23/17 0410 01/23/17 1200  BP: 113/62 124/74 127/63 (!) 155/101  Pulse: (!) 112 100 (!) 110 98  Resp: 18 (!) 24 (!) 22 (!) 23  Temp: (!) 97.4 F (36.3 C) 98.7 F (37.1 C) 99.3 F (37.4 C) 99.1 F (37.3 C)  TempSrc: Oral Oral Oral Oral  SpO2: 98% 97% 95% 92%  Weight: 88.9 kg (195 lb 15.8 oz)     Height:        Inpatient medications: . alteplase  4 mg Intracatheter Once  . calcitRIOL  2 mcg Oral Q M,W,F  . calcium gluconate  1 g Intravenous Once  . cinacalcet  120 mg Oral Once per day on Mon Wed Fri  . cyanocobalamin  1,000 mcg Intramuscular Once  . dextrose  1 ampule Intravenous Once  . heparin  5,000 Units Subcutaneous Q8H  . insulin aspart  0-15 Units Subcutaneous Q4H  . insulin aspart  10 Units Intravenous Once   . piperacillin-tazobactam (ZOSYN)  IV Stopped (01/23/17 0757)  . vancomycin Stopped (01/22/17 1808)   acetaminophen **OR** acetaminophen, hydrALAZINE, ipratropium-albuterol, morphine injection, ondansetron **OR** ondansetron (ZOFRAN) IV  Exam: Awakens but not speaking, grunts in response to simple questions, lethargic, no jerking or seizure activity noted JVD+ Chest occ crackles at bases, o/ w clear Cor RRR tachy Abd soft large midline scars well healed, no ascites, no hsm Ext IO R shin, no pitting edema Neuro not responding verbally, somnolent   Dialysis: :MWF [facility] 4h 86min    2/2.5 bath  95.5kg  R IJ cath/ R BC AVF (excision / repair of some scarred areas 11/05/16 Dr Donzetta Matters / on 11/19 Dr Scot Dock did percut venoplasty to long intraAVF narrowing, also 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 Altered mental status - prob uremic, improving with HD 2. ESRD HD MWF.  Plan HD Monday.  3. MBD of CKD - cont meds when able to swallow  4. Anemia of CKD - Hb at goal 5. DM per primary 6. COPD 7. Volume  - CXR clear , no vol on exam, under dry wt  Plan - as above   Kelly Splinter MD [place] pager [phone]   01/23/2017, 3:07 PM   Recent Labs  Lab 01/20/17 2252 01/21/17 1013  01/22/17 1147 01/22/17 1330  01/23/17 0055 01/23/17 0607 01/23/17 0857  NA  --  139   < > 141 139  --   --  139  --   K  --  4.9   < > >7.5* 5.5*   < > 4.6 5.0 4.8  CL  --  103   < > 107 102  --   --  97*  --   CO2  --  19*   < > 13* 20*  --   --  20*  --   GLUCOSE  --  116*   < > 108* 121*  --   --  107*  --   BUN  --  63*   < > 101* 93*  --   --  59*  --   CREATININE  --  11.13*   < > 15.33* 14.01*  --   --  9.94*  --   CALCIUM  --  9.4   < > 9.7 9.4  --   --  9.2  --   PHOS 6.8*  6.5*  --   --   --   --   --   --   --    < > = values in this interval not displayed.   Recent Labs  Lab 01/20/17 1815 01/21/17 1013 01/22/17 1147  AST 24  --  26  ALT 15*  --  14*  ALKPHOS 55  --  42  BILITOT 1.1  --  1.5*  PROT 7.5  --  6.9  ALBUMIN 4.1 3.7 3.6   Recent Labs  Lab 01/20/17 2252 01/21/17 1013 01/22/17 0304 01/23/17 0607  WBC 15.5* 16.5* 16.0* 15.2*  NEUTROABS 13.5*  --  13.8* 12.6*  HGB 11.8* 12.1* 12.2* 12.3*  HCT 35.0* 36.9*  36.6* 37.0* 38.2*  MCV 104.2* 105.4* 104.2* 107.0*  PLT 279 240 246 233   Iron/TIBC/Ferritin/ %Sat    Component Value Date/Time   IRON 55 03/09/2015 0442   TIBC 244 (L) 03/09/2015 0442   FERRITIN 348 (H) 03/09/2015 0442   IRONPCTSAT 23 03/09/2015 0442
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
## 2017-01-23 NOTE — Progress Notes (Addendum)
Patient allowed NPO with ice chips as long as he is alert and sitting upright. Will continue to monitor

## 2017-01-23 NOTE — Progress Notes (Signed)
Called Dr. Havery Moros concerning IM thorazine.  Pt does not want shot IM. MD says to hold for now and he will reassess pt in morning. Pt made aware.

## 2017-01-23 NOTE — Progress Notes (Signed)
Gave pt two small ice chips and pt asked daughter for Pepsi to drink after.  Pt and daughter state she gave him some earlier and he had no problem.  Pt and daughter aware that pt may be at risk for aspiration pneumonia.  Pt and daughter seem to be unconcerned. Pt is aware we will not give him anything other than ice chips. Pt resting with call bell within reach.  Will continue to monitor.

## 2017-01-23 NOTE — Progress Notes (Addendum)
PROGRESS NOTE  [name] ZOX:[id number] DOB: 17-Aug-1950 DOA: 01/20/2017 PCP: [place]  HPI/Recap of past 24 hours: HPI from Dr Fuller Plan on 01/20/17   [name] a 66 y.o.malewith medical history significant ofESRD on HD(MWF), HTN, DM, HLD; who presents after being found down at his home when he did not show up for his routine scheduled dialysis.  Patient's daughter provided history as he is acutely altered.  She reports receiving multiple calls from hemodialysis today stating that the patient missed his appointment.  She went over to his home to check on him and found him on the floor minimally responsive.Patient was seen in the emergency department on 01/19/17 after suffering a fall found to have a fracture to the distal end of his left fibula. It appears he received a cast and was sent home with pain medications of Tylenol to follow-up with orthopedics in the outpatient setting. Upon admission to the ED, patient was seen to be afebrile pulse 82-110, respirations 24-27, blood pressure 179/97-208/97, and O2 saturations 96-99% on room air. Labs revealed potassium 6.8, BUN 86, creatinine 14.38, anion gap 17.  Chest x-ray showed no acute abnormalities.  CT scan of the brain showed no acute intracranial findings.  EKG showed signs of T wave peaking.  Patient received 1 g of calcium gluconate, 50 mL of dextrose 50% solution, amp of sodium bicarb, and 10 units of insulin on the ED. Neurology was consulted due to patient's altered mental status. Nephrology was also consulted for need of dialysis. Pt admitted for further management  Today, pt noted to be more awake, able to follow commands. Still noted to be lethargic. Still reports L ankle pain due to fall. Otherwise, denies any chest pain, SOB, abdominal pain, N/V/D/C, fever/chills. SLP and GI consulted due to dsyphagia   Assessment/Plan: Principal Problem:   Hyperkalemia Active Problems:   Diabetes mellitus ([place])   End  stage renal disease on dialysis ([place])   SIRS (systemic inflammatory response syndrome) ([facility])   Hypertensive urgency   Closed fracture of distal end of left fibula   Anemia due to chronic kidney disease   Acute metabolic encephalopathy  #Acute toxic metabolic encephalopathy:  Improving Etiology, likely due to Uremia Vs infection Vs medication  CT scan: Atrophy and small vessel disease. No acute intracranial findings MRI showed: No acute intracranial abnormality. Moderate chronic small vessel ischemic disease. CXR no active disease Afebrile, with leukocytosis BC NGTD, LA WNL, Procalcitonin 0.9-->1.31 EEG: Generalized irregular slow activity, triphasic waves Ammonia 34, CPK 622 ABG: PH 7.4, PCO2 33.8, PO2 82.4, HCO3 20.7 Neurochecks NPO, until more alert and able to follow commands SLP consulted: Keep NPO for now, suspect esophageal dysphagia. GI consulted Neurology on board, appreciate recs  #SIRS/Sepsis:  Afebrile, with leukocytosis, ?? source - Sepsis protocol initiated - Blood cultures NGTD, LA WNL, procalcitonin 0.9 - Empiric antibiotics IV Vanco and Zosyn started Daily CBC  #End-stage renal disease on HD:  Missed a couple of session due to weather Monday/Wednesday/Friday schedule Presented with potassium 6.8, BUN 86, and creatinine 14.38, not overloaded Continuous pulse oximetry with nasal cannula oxygen as needed Nephrology on board  #Hyperkalemia: Resolved Likely due to missed dialysis, 6.8 on admission.  EKG with signs of T wave peaking noted. Patient received 1 g of calcium gluconate, 50 mL of dextrose 50% solution, amp of sodium bicarb, and 10 units of insulin on the ED - Admit to a telemetry bed - Hemodialysis per nephrology Daily BMP  #Hypertensive  urgency/emergency: Blood pressures noted to be as high as 208/97 on admission.   - IV hydralazine as needed, until pt able to tolerate orally   #Anemia of chronic disease:  Hemoglobin noted to be 11.8 with  elevated MCV and MCH - Check vitamin B12  245,  and folate 5.5 -Will give Vit K87, folic acid once able to tolerate  #Diabetes mellitus type 2:  Last hemoglobin A1c noted to be 5.5 on 02/28/2016. Continue to CBG Q4H as pt is NPO  #Closed fracture distal end of left fibula 2/2 fall:  Called ortho tech for ACE wrap, splint Pain management with IV morphine  Patient was set up to be followed up in the outpatient setting with Dr. Fredonia Highland of orthopedics on 12/14, consider calling ortho if pt when pt is more stable     Code Status: Full  Family Communication: None at bedside  Disposition Plan: Once stable   Consultants:  Nephrology  Neurology  Procedures:  None  Antimicrobials:  IV Vanc + IV Zosyn  DVT prophylaxis:  Sharon heparin   Objective: Vitals:   01/22/17 1700 01/22/17 2139 01/23/17 0410 01/23/17 1200  BP: 113/62 124/74 127/63 (!) 155/101  Pulse: (!) 112 100 (!) 110 98  Resp: 18 (!) 24 (!) 22 (!) 23  Temp: (!) 97.4 F (36.3 C) 98.7 F (37.1 C) 99.3 F (37.4 C) 99.1 F (37.3 C)  TempSrc: Oral Oral Oral Oral  SpO2: 98% 97% 95% 92%  Weight: 88.9 kg (195 lb 15.8 oz)     Height:        Intake/Output Summary (Last 24 hours) at 01/23/2017 1423 Last data filed at 01/23/2017 0431 Gross per 24 hour  Intake -  Output 1275 ml  Net -1275 ml   Filed Weights   01/21/17 0700 01/22/17 1325 01/22/17 1700  Weight: 93.7 kg (206 lb 9.1 oz) 89.6 kg (197 lb 8.5 oz) 88.9 kg (195 lb 15.8 oz)    Exam:   General: Lethargic, easily arousable, alert, awake, oriented to self, able to follow commands  Cardiovascular: S1-S2 present, no added heart sounds  Respiratory: Mild crackles at the bases  Abdomen: Soft, nontender, bowel sounds present, large midline scar noted  Musculoskeletal: No pedal edema bilaterally  Skin: Normal  Psychiatry: Unable to assess  Neuro: Moves all limbs, withdraws from pain   Data Reviewed: CBC: Recent Labs  Lab 01/20/17 2252  01/21/17 1013 01/22/17 0304 01/23/17 0607  WBC 15.5* 16.5* 16.0* 15.2*  NEUTROABS 13.5*  --  13.8* 12.6*  HGB 11.8* 12.1* 12.2* 12.3*  HCT 35.0* 36.9*  36.6* 37.0* 38.2*  MCV 104.2* 105.4* 104.2* 107.0*  PLT 279 240 246 681   Basic Metabolic Panel: Recent Labs  Lab 01/20/17 1815 01/20/17 2252 01/21/17 1013 01/22/17 0304 01/22/17 1147 01/22/17 1330 01/22/17 1904 01/22/17 2141 01/23/17 0055 01/23/17 0607 01/23/17 0857  NA 139  --  139 139 141 139  --   --   --  139  --   K 6.8*  --  4.9 5.4* >7.5* 5.5* 4.2 4.8 4.6 5.0 4.8  CL 104  --  103 102 107 102  --   --   --  97*  --   CO2 18*  --  19* 20* 13* 20*  --   --   --  20*  --   GLUCOSE 97  --  116* 115* 108* 121*  --   --   --  107*  --   BUN 86*  --  63* 83* 101* 93*  --   --   --  59*  --   CREATININE 14.38*  --  11.13* 12.94* 15.33* 14.01*  --   --   --  9.94*  --   CALCIUM 9.9  --  9.4 9.6 9.7 9.4  --   --   --  9.2  --   MG 2.3  --   --   --   --   --   --   --   --   --   --   PHOS  --  6.8* 6.5*  --   --   --   --   --   --   --   --    GFR: Estimated Creatinine Clearance: 7.7 mL/min (A) (by C-G formula based on SCr of 9.94 mg/dL (H)). Liver Function Tests: Recent Labs  Lab 01/20/17 1815 01/21/17 1013 01/22/17 1147  AST 24  --  26  ALT 15*  --  14*  ALKPHOS 55  --  42  BILITOT 1.1  --  1.5*  PROT 7.5  --  6.9  ALBUMIN 4.1 3.7 3.6   No results for input(s): LIPASE, AMYLASE in the last 168 hours. Recent Labs  Lab 01/20/17 2252  AMMONIA 34   Coagulation Profile: No results for input(s): INR, PROTIME in the last 168 hours. Cardiac Enzymes: Recent Labs  Lab 01/20/17 2252  CKTOTAL 622*   BNP (last 3 results) No results for input(s): PROBNP in the last 8760 hours. HbA1C: No results for input(s): HGBA1C in the last 72 hours. CBG: Recent Labs  Lab 01/22/17 2137 01/23/17 0025 01/23/17 0403 01/23/17 0811 01/23/17 1139  GLUCAP 103* 104* 104* 99 100*   Lipid Profile: No results for input(s):  CHOL, HDL, LDLCALC, TRIG, CHOLHDL, LDLDIRECT in the last 72 hours. Thyroid Function Tests: No results for input(s): TSH, T4TOTAL, FREET4, T3FREE, THYROIDAB in the last 72 hours. Anemia Panel: Recent Labs    01/21/17 1013  VITAMINB12 245   Urine analysis:    Component Value Date/Time   COLORURINE YELLOW 12/15/2016 1736   APPEARANCEUR CLEAR 12/15/2016 1736   LABSPEC 1.011 12/15/2016 1736   PHURINE 7.0 12/15/2016 1736   GLUCOSEU 50 (A) 12/15/2016 1736   HGBUR NEGATIVE 12/15/2016 1736   BILIRUBINUR NEGATIVE 12/15/2016 1736   KETONESUR NEGATIVE 12/15/2016 1736   PROTEINUR >=300 (A) 12/15/2016 1736   UROBILINOGEN 0.2 09/08/2007 1345   NITRITE NEGATIVE 12/15/2016 1736   LEUKOCYTESUR NEGATIVE 12/15/2016 1736   Sepsis Labs: @LABRCNTIP (procalcitonin:4,lacticidven:4)  ) Recent Results (from the past 240 hour(s))  Culture, blood (x 2)     Status: None (Preliminary result)   Collection Time: 01/21/17  3:25 AM  Result Value Ref Range Status   Specimen Description BLOOD LEFT WRIST  Final   Special Requests   Final    BOTTLES DRAWN AEROBIC AND ANAEROBIC Blood Culture adequate volume   Culture NO GROWTH 1 DAY  Final   Report Status PENDING  Incomplete  Culture, blood (x 2)     Status: None (Preliminary result)   Collection Time: 01/21/17 10:10 AM  Result Value Ref Range Status   Specimen Description BLOOD LEFT ARM  Final   Special Requests   Final    BOTTLES DRAWN AEROBIC AND ANAEROBIC Blood Culture results may not be optimal due to an excessive volume of blood received in culture bottles   Culture NO GROWTH 1 DAY  Final   Report Status PENDING  Incomplete  MRSA  PCR Screening     Status: None   Collection Time: 01/21/17  4:24 PM  Result Value Ref Range Status   MRSA by PCR NEGATIVE NEGATIVE Final    Comment:        The GeneXpert MRSA Assay (FDA approved for NASAL specimens only), is one component of a comprehensive MRSA colonization surveillance program. It is not intended to  diagnose MRSA infection nor to guide or monitor treatment for MRSA infections.       Studies: No results found.  Scheduled Meds: . alteplase  4 mg Intracatheter Once  . calcitRIOL  2 mcg Oral Q M,W,F  . calcium gluconate  1 g Intravenous Once  . cinacalcet  120 mg Oral Once per day on Mon Wed Fri  . dextrose  1 ampule Intravenous Once  . heparin  5,000 Units Subcutaneous Q8H  . insulin aspart  0-15 Units Subcutaneous Q4H  . insulin aspart  10 Units Intravenous Once    Continuous Infusions: . piperacillin-tazobactam (ZOSYN)  IV Stopped (01/23/17 0757)  . vancomycin Stopped (01/22/17 1808)     LOS: 3 days     Alma Friendly, MD [facility]   If 7PM-7AM, please contact night-coverage [email or web address] Password [email or web address] 01/23/2017, 2:23 PM

## 2017-01-23 NOTE — Plan of Care (Signed)
  Clinical Measurements: Diagnostic test results will improve 01/23/2017 0304 - Progressing by Peggye Pitt, RN   Clinical Measurements: Respiratory complications will improve 01/23/2017 0304 - Progressing by Peggye Pitt, RN   Health Behavior/Discharge Planning: Ability to manage health-related needs will improve 01/23/2017 0304 - Not Progressing by Peggye Pitt, RN   Clinical Measurements: Ability to maintain clinical measurements within normal limits will improve 01/23/2017 0304 - Not Progressing by Peggye Pitt, RN   Activity: Risk for activity intolerance will decrease 01/23/2017 0304 - Not Progressing by Peggye Pitt, RN   Skin Integrity: Risk for impaired skin integrity will decrease 01/23/2017 0304 - Not Progressing by Peggye Pitt, RN

## 2017-01-23 NOTE — Progress Notes (Signed)
Subjective: Greatly improved  Exam: Vitals:   01/22/17 2139 01/23/17 0410  BP: 124/74 127/63  Pulse: 100 (!) 110  Resp: (!) 24 (!) 22  Temp: 98.7 F (37.1 C) 99.3 F (37.4 C)  SpO2: 97% 95%   Gen: In bed, NAD Resp: non-labored breathing, no acute distress Abd: soft, nt  Neuro: MS: Awake, alert, oriented to place, month, but does not give year.  TI:RWERX, VFF Motor: He limits his LLE movement due to previous ankle injury,but moves other extremities well. Asterixis is improved even since I saw him after dialysis yesterday.  Sensory:intact to LT  Pertinent Labs: BUN 59  Impression: 66 yo M with improving metabolic encephalopathy.   Recommendations: 1) Continued medical management, no further recommendations at this time. Please call with further questions or concerns.   Roland Rack, MD [facility] [phone]  If 7pm- 7am, please page neurology on call as listed in [place].

## 2017-01-24 ENCOUNTER — Inpatient Hospital Stay (HOSPITAL_COMMUNITY): Payer: Medicare Other

## 2017-01-24 LAB — CBC
HCT: 32.9 % — ABNORMAL LOW (ref 39.0–52.0)
Hemoglobin: 10.6 g/dL — ABNORMAL LOW (ref 13.0–17.0)
MCH: 34 pg (ref 26.0–34.0)
MCHC: 32.2 g/dL (ref 30.0–36.0)
MCV: 105.4 fL — ABNORMAL HIGH (ref 78.0–100.0)
PLATELETS: 265 10*3/uL (ref 150–400)
RBC: 3.12 MIL/uL — AB (ref 4.22–5.81)
RDW: 14.9 % (ref 11.5–15.5)
WBC: 13.3 10*3/uL — AB (ref 4.0–10.5)

## 2017-01-24 LAB — CBC WITH DIFFERENTIAL/PLATELET
BASOS ABS: 0 10*3/uL (ref 0.0–0.1)
Basophils Relative: 0 %
Eosinophils Absolute: 0.1 10*3/uL (ref 0.0–0.7)
Eosinophils Relative: 1 %
HCT: 34.2 % — ABNORMAL LOW (ref 39.0–52.0)
Hemoglobin: 11.2 g/dL — ABNORMAL LOW (ref 13.0–17.0)
LYMPHS ABS: 1.4 10*3/uL (ref 0.7–4.0)
Lymphocytes Relative: 11 %
MCH: 35.1 pg — ABNORMAL HIGH (ref 26.0–34.0)
MCHC: 32.7 g/dL (ref 30.0–36.0)
MCV: 107.2 fL — ABNORMAL HIGH (ref 78.0–100.0)
MONO ABS: 1.8 10*3/uL — AB (ref 0.1–1.0)
MONOS PCT: 14 %
Neutro Abs: 9.6 10*3/uL — ABNORMAL HIGH (ref 1.7–7.7)
Neutrophils Relative %: 74 %
PLATELETS: 223 10*3/uL (ref 150–400)
RBC: 3.19 MIL/uL — AB (ref 4.22–5.81)
RDW: 15.6 % — ABNORMAL HIGH (ref 11.5–15.5)
WBC: 12.9 10*3/uL — AB (ref 4.0–10.5)

## 2017-01-24 LAB — BASIC METABOLIC PANEL
ANION GAP: 17 — AB (ref 5–15)
Anion gap: 18 — ABNORMAL HIGH (ref 5–15)
BUN: 78 mg/dL — ABNORMAL HIGH (ref 6–20)
BUN: 82 mg/dL — AB (ref 6–20)
CO2: 23 mmol/L (ref 22–32)
CO2: 23 mmol/L (ref 22–32)
CREATININE: 12.65 mg/dL — AB (ref 0.61–1.24)
Calcium: 9 mg/dL (ref 8.9–10.3)
Calcium: 9.1 mg/dL (ref 8.9–10.3)
Chloride: 98 mmol/L — ABNORMAL LOW (ref 101–111)
Chloride: 96 mmol/L — ABNORMAL LOW (ref 101–111)
Creatinine, Ser: 11.98 mg/dL — ABNORMAL HIGH (ref 0.61–1.24)
GFR calc Af Amer: 4 mL/min — ABNORMAL LOW (ref >60)
GFR calc Af Amer: 4 mL/min — ABNORMAL LOW (ref >60)
GFR, EST NON AFRICAN AMERICAN: 4 mL/min — AB (ref >60)
GFR, EST NON AFRICAN AMERICAN: 4 mL/min — AB (ref >60)
Glucose, Bld: 100 mg/dL — ABNORMAL HIGH (ref 65–99)
Glucose, Bld: 97 mg/dL (ref 65–99)
POTASSIUM: 4.9 mmol/L (ref 3.5–5.1)
Potassium: 4.9 mmol/L (ref 3.5–5.1)
SODIUM: 138 mmol/L (ref 135–145)
SODIUM: 137 mmol/L (ref 135–145)

## 2017-01-24 LAB — GLUCOSE, CAPILLARY
GLUCOSE-CAPILLARY: 113 mg/dL — AB (ref 65–99)
GLUCOSE-CAPILLARY: 120 mg/dL — AB (ref 65–99)
GLUCOSE-CAPILLARY: 173 mg/dL — AB (ref 65–99)
GLUCOSE-CAPILLARY: 85 mg/dL (ref 65–99)
GLUCOSE-CAPILLARY: 92 mg/dL (ref 65–99)
Glucose-Capillary: 91 mg/dL (ref 65–99)
Glucose-Capillary: 125 mg/dL — ABNORMAL HIGH (ref 65–99)

## 2017-01-24 LAB — AMMONIA: AMMONIA: 52 umol/L — AB (ref 9–35)

## 2017-01-24 MED ORDER — ALTEPLASE 2 MG IJ SOLR: 2.0000 mg | Freq: Once | INTRAMUSCULAR | Status: DC | PRN

## 2017-01-24 MED ORDER — SODIUM CHLORIDE 0.9 % IV SOLN: 100.0000 mL | INTRAVENOUS | Status: DC | PRN

## 2017-01-24 MED ORDER — NALOXONE HCL 0.4 MG/ML IJ SOLN
INTRAMUSCULAR | Status: AC
  Administered 2017-01-24: 0.4 mg
  Filled 2017-01-24: qty 1

## 2017-01-24 MED ORDER — ONDANSETRON HCL 4 MG/2ML IJ SOLN
INTRAMUSCULAR | Status: AC
  Administered 2017-01-24: 4 mg via INTRAVENOUS
  Filled 2017-01-24: qty 2

## 2017-01-24 MED ORDER — PENTAFLUOROPROP-TETRAFLUOROETH EX AERO: 1.0000 "application " | INHALATION_SPRAY | CUTANEOUS | Status: DC | PRN

## 2017-01-24 MED ORDER — HEPARIN SODIUM (PORCINE) 1000 UNIT/ML DIALYSIS: 1000.0000 [IU] | INTRAMUSCULAR | Status: DC | PRN

## 2017-01-24 MED ORDER — LIDOCAINE-PRILOCAINE 2.5-2.5 % EX CREA: 1.0000 "application " | TOPICAL_CREAM | CUTANEOUS | Status: DC | PRN

## 2017-01-24 MED ORDER — ACETAMINOPHEN 325 MG PO TABS
ORAL_TABLET | ORAL | Status: AC
  Administered 2017-01-24: 650 mg via ORAL
  Filled 2017-01-24: qty 2

## 2017-01-24 MED ORDER — LIDOCAINE HCL (PF) 1 % IJ SOLN: 5.0000 mL | INTRAMUSCULAR | Status: DC | PRN

## 2017-01-24 MED ORDER — SODIUM CHLORIDE 0.9 % IV SOLN
100.0000 mL | INTRAVENOUS | Status: DC | PRN
Start: 2017-01-24 — End: 2017-01-24

## 2017-01-24 NOTE — Progress Notes (Signed)
Patient arrived to unit by bed.  Reviewed treatment plan and this RN agrees with plan.  Report received from bedside RN, Katharine Look.  Consent verified.  Patient A & O  X4.   Lung sounds diminished to ausculation in all fields. No edema. Cardiac:  NSR.  Removed caps and cleansed RIJ catheter with chlorhedxidine.  Aspirated ports of heparin and flushed them with saline per protocol.  Connected and secured lines, initiated treatment at 2015.  UF Goal of 500 mL and net fluid removal 0 L.  Will continue to monitor.

## 2017-01-24 NOTE — Progress Notes (Addendum)
  Speech Language Pathology Treatment: Dysphagia  Patient Details Name: [name] MRN: [id number] DOB: 10/15/1950 Today's Date: 01/24/2017 Time: 6213-0865 SLP Time Calculation (min) (ACUTE ONLY): 17 min  Assessment / Plan / Recommendation Clinical Impression  Dysphagia treatment provided for PO trials. Pt with improved mental status today, following commands and have a simple conversation without difficulty, oriented to self and place, able to feed self. Pt showed no overt s/s of aspiration following trials of thin liquid or puree consistency but had an immediate cough following trial of regular solid. Pt expressed having some nausea as well and has had very frequent hiccups occurring every few seconds. Pt was unsure exactly how long they have been going on but felt that it has possibly been for a few days. GI is following. Recommend cautiously initiating full liquid diet, meds crushed in puree, with full supervision during meals to monitor to cue slow rate and small bites/ sips along with reflux precautions- sit upright 30-60 minutes after meal. Will continue to follow for diet tolerance/ consider advancement.   HPI HPI: [name] a 66 y.o.malewith medical history significant ofESRD on HD(MWF), HTN, DM, HLD, GERD;who presents after being found downat his homewhen he did not show up for his routine scheduled dialysis. Patient was recently seen in the emergency departmenton 12/11/18after suffering a fallfound to have a fracturetothe distal end of his left fibula.CT scan of the brain showed no acute intracranial findings. Admitted with acute toxic metabolic encephalopathy of unclear etiology, SIRS/sepsis.      SLP Plan  Goals updated       Recommendations  Diet recommendations: Other(comment)(full liquid) Liquids provided via: Cup;Straw Medication Administration: Crushed with puree Supervision: Patient able to self feed;Full supervision/cueing for compensatory  strategies Compensations: Slow rate;Small sips/bites Postural Changes and/or Swallow Maneuvers: Seated upright 90 degrees;Upright 30-60 min after meal                Oral Care Recommendations: Oral care BID Follow up Recommendations: Other (comment)(TBD) SLP Visit Diagnosis: Dysphagia, unspecified (R13.10) Plan: Goals updated       [place], [place], CCC-SLP 01/24/2017, 1:10 PM [phone]

## 2017-01-24 NOTE — Progress Notes (Signed)
Dialysis treatment completed.  500 mL ultrafiltrated.  0 mL net fluid removal.  Patient status unchanged. Lung sounds diminished to ausculation in all fields. Generalized edema. Cardiac: NSR.  Cleansed RIJ catheter with chlorhexidine.  Disconnected lines and flushed ports with saline per protocol.  Ports locked with heparin and capped per protocol.    Report given to bedside, RN Butch Penny.

## 2017-01-24 NOTE — Significant Event (Addendum)
Rapid Response Event Note  Overview: Time Called: 0508 Arrival Time: 0510 Event Type: Neurologic  Initial Focused Assessment: Called by RN about patient having acute change in mental status. Per RN and NT patient was alert and conversant earlier in the shift.  Patient stated he wanted a bath about 30 minutes prior to becoming unresponsive, per RNs he was not responding to sternal rub either.  When I arrived, patient was very lethargic, withdrew pain equally,+ GAG/CR,  patient did receive morphine 2mg  IV at 2037, I gave Narcan 0.4mg  IV at 516. VSS, SpO2 did drop earlier and patient was placed on nasal cannula and maintaining sats > 98% on 2L. Blood sugar as 92. Patient was breathing comfortably and not in acute distress.   Interventions: -- TRH NP called, updated, I informed him that I page Neuro MD on call, I paged Neuro MD on call, updated him on patient's condition, both providers came to the bedside, by then patient was more awake, stated he was cold and wanted a blanket.  Neuro MD asked the BMP, ammonia, and CT HEAD STAT be ordered, and TRH NP is going to contact Renal MD on call to see if patient can be dialyzed earlier today. -- Head CT - Negative for bleed  Plan of Care (if not transferred): -- labs, HD ?  -- While in CT and after arriving back to the room, patient was more awake, asked that his TV be turned on and was requesting some pain medications ( RN to give APAP). -- IV morphine was discontinued, instructed to use APAP for pain relief for now and have day time provider address pain management.   Event Summary: Name of Physician Notified: Alger [facility] NP at [phone]  Name of Consulting Physician Notified: Dr. Rory Percy MD  at [phone]  Outcome: Stayed in room and stabalized  Event End Time: 0610  Netty Starring

## 2017-01-24 NOTE — Progress Notes (Signed)
[facility] Progress Note  Subjective: still confused, BuN/Cr up today  Vitals:   01/23/17 1600 01/23/17 1956 01/24/17 0002 01/24/17 0335  BP: (!) 144/58 (!) 144/75 129/71 (!) 158/73  Pulse: 92 100 74 84  Resp: 18 18 20 19   Temp:  99.2 F (37.3 C) 99.5 F (37.5 C) 99.3 F (37.4 C)  TempSrc:  Oral Oral Oral  SpO2: 93% 95% 93% 94%  Weight:      Height:        Inpatient medications: . alteplase  4 mg Intracatheter Once  . calcitRIOL  2 mcg Oral Q M,W,F  . calcium gluconate  1 g Intravenous Once  . chlorproMAZINE (THORAZINE) injection  25 mg Intramuscular Once  . cinacalcet  120 mg Oral Once per day on Mon Wed Fri  . dextrose  1 ampule Intravenous Once  . heparin  5,000 Units Subcutaneous Q8H  . insulin aspart  0-15 Units Subcutaneous Q4H  . insulin aspart  10 Units Intravenous Once  . pantoprazole (PROTONIX) IV  40 mg Intravenous QHS   . piperacillin-tazobactam (ZOSYN)  IV Stopped (01/24/17 0910)  . vancomycin Stopped (01/22/17 1808)   acetaminophen **OR** acetaminophen, hydrALAZINE, ipratropium-albuterol, ondansetron **OR** ondansetron (ZOFRAN) IV  Exam: Sleeping , awakens and responds to some questions Chest occ crackles at bases, o/ w clear Cor RRR tachy Abd soft large midline scars well healed, no ascites, no hsm Ext IO R shin, no pitting edema Neuro not responding verbally, somnolent   Dialysis: :MWF [facility] 4h 31min    2/2.5 bath  95.5kg  R IJ cath/ R BC AVF (excision / repair of some scarred areas 11/05/16 Dr Donzetta Matters / on 11/19 Dr Scot Dock percut venoplasty )     Impression: 1. Altered mental status - worse again, azotemia worsening, needs more HD. Plan HD today and tomorrow.  2. ESRD HD MWF 3. MBD of CKD - cont meds when able to swallow  4. Anemia of CKD - Hb at goal 5. DM per primary 6. COPD 7. Volume - CXR clear , no vol on exam, under dry wt  Plan - as above   Kelly Splinter MD [place] pager [phone]   01/24/2017, 11:50  AM   Recent Labs  Lab 01/20/17 2252 01/21/17 1013  01/23/17 0607 01/23/17 0857 01/24/17 0245 01/24/17 0905  NA  --  139   < > 139  --  138 137  K  --  4.9   < > 5.0 4.8 4.9 4.9  CL  --  103   < > 97*  --  98* 96*  CO2  --  19*   < > 20*  --  23 23  GLUCOSE  --  116*   < > 107*  --  100* 97  BUN  --  63*   < > 59*  --  78* 82*  CREATININE  --  11.13*   < > 9.94*  --  11.98* 12.65*  CALCIUM  --  9.4   < > 9.2  --  9.0 9.1  PHOS 6.8* 6.5*  --   --   --   --   --    < > = values in this interval not displayed.   Recent Labs  Lab 01/20/17 1815 01/21/17 1013 01/22/17 1147  AST 24  --  26  ALT 15*  --  14*  ALKPHOS 55  --  42  BILITOT 1.1  --  1.5*  PROT 7.5  --  6.9  ALBUMIN 4.1 3.7 3.6   Recent Labs  Lab 01/22/17 0304 01/23/17 0607 01/24/17 0245  WBC 16.0* 15.2* 12.9*  NEUTROABS 13.8* 12.6* 9.6*  HGB 12.2* 12.3* 11.2*  HCT 37.0* 38.2* 34.2*  MCV 104.2* 107.0* 107.2*  PLT 246 233 223   Iron/TIBC/Ferritin/ %Sat    Component Value Date/Time   IRON 55 03/09/2015 0442   TIBC 244 (L) 03/09/2015 0442   FERRITIN 348 (H) 03/09/2015 0442   IRONPCTSAT 23 03/09/2015 0442

## 2017-01-24 NOTE — Progress Notes (Signed)
Patient ID: [name], male   DOB: December 25, 1950, 66 y.o.   MRN: [id number]   Brief GI note   Pt was re-evaluated today by speech path, and was able to swallow thin liquids and pureed without any overt aspiration Diet is being advanced to full liquids with  Feeding  precautions outlined   GI will follow up Monday .

## 2017-01-24 NOTE — Progress Notes (Signed)
Patient remembers people around his bed this morning trying to get blood twice out of his left hand.  Pt tried to get lab person to stop due to pain. He states needs something for pain not tylenol. Pain is currently 8/10 in left leg. Pt resting with call bell within reach.  Will continue to monitor. Payton Emerald, RN

## 2017-01-24 NOTE — Progress Notes (Signed)
PROGRESS NOTE  [name] GLO:[id number] DOB: 1951/01/24 DOA: 01/20/2017 PCP: [place]  HPI/Recap of past 24 hours: HPI from Dr Fuller Plan on 01/20/17   [name] a 66 y.o.malewith medical history significant ofESRD on HD(MWF), HTN, DM, HLD; who presents after being found down at his home when he did not show up for his routine scheduled dialysis.  Patient's daughter provided history as he is acutely altered.  She reports receiving multiple calls from hemodialysis today stating that the patient missed his appointment.  She went over to his home to check on him and found him on the floor minimally responsive.Patient was seen in the emergency department on 01/19/17 after suffering a fall found to have a fracture to the distal end of his left fibula. It appears he received a cast and was sent home with pain medications of Tylenol to follow-up with orthopedics in the outpatient setting. Upon admission to the ED, patient was seen to be afebrile pulse 82-110, respirations 24-27, blood pressure 179/97-208/97, and O2 saturations 96-99% on room air. Labs revealed potassium 6.8, BUN 86, creatinine 14.38, anion gap 17.  Chest x-ray showed no acute abnormalities.  CT scan of the brain showed no acute intracranial findings.  EKG showed signs of T wave peaking.  Patient received 1 g of calcium gluconate, 50 mL of dextrose 50% solution, amp of sodium bicarb, and 10 units of insulin on the ED. Neurology was consulted due to patient's altered mental status. Nephrology was also consulted for need of dialysis. Pt admitted for further management  Overnight, pt was noted to be altered and facial droop was noted by the RN who alerted the rapid response team and stroke team. A CT head was done which was negative. Today, pt noted to be more awake, able to follow commands. Still noted to be mildly lethargic, and c/o nausea but requesting food. Still reports L ankle pain due to fall. Otherwise,  denies any chest pain, SOB, abdominal pain, V/D/C, fever/chills   Assessment/Plan: Principal Problem:   Hyperkalemia Active Problems:   Diabetes mellitus (HCC)   End stage renal disease on dialysis (HCC)   SIRS (systemic inflammatory response syndrome) ([place])   Hypertensive urgency   Closed fracture of distal end of left fibula   Anemia due to chronic kidney disease   Acute metabolic encephalopathy   Abnormal swallowing   Altered mental status  #Acute toxic metabolic encephalopathy:  Ongoing Etiology, likely due to Uremia Vs infection Vs medication  CT scan: Atrophy and small vessel disease. No acute intracranial findings, repeat CT negative MRI showed: No acute intracranial abnormality. Moderate chronic small vessel ischemic disease. CXR no active disease Afebrile, with resolving leukocytosis BC NGTD, LA WNL, Procalcitonin 0.9-->1.31 EEG: Generalized irregular slow activity, triphasic waves Ammonia 34, CPK 622 ABG: PH 7.4, PCO2 33.8, PO2 82.4, HCO3 20.7 Neurochecks NPO, until more alert and able to follow commands SLP consulted: Keep NPO for now, suspect esophageal dysphagia. GI consulted, appreciate recs Neurology on board, appreciate recs  #SIRS/Sepsis:  Afebrile, with leukocytosis, ?? source - Sepsis protocol initiated - Blood cultures NGTD, LA WNL, procalcitonin 0.9 - Empiric antibiotics IV Vanco and Zosyn started Daily CBC  #End-stage renal disease on HD:  Missed a couple of session due to weather Monday/Wednesday/Friday schedule Presented with potassium 6.8, BUN 86, and creatinine 14.38, not overloaded Continuous pulse oximetry with nasal cannula oxygen as needed Nephrology on board  #Hyperkalemia: Resolved Likely due to missed dialysis, 6.8 on admission.  EKG with signs of T wave peaking noted. Patient received 1 g of calcium gluconate, 50 mL of dextrose 50% solution, amp of sodium bicarb, and 10 units of insulin on the ED - Admit to a telemetry bed -  Hemodialysis per nephrology Daily BMP  #Hypertensive urgency/emergency: Blood pressures noted to be as high as 208/97 on admission.   - IV hydralazine as needed, until pt able to tolerate orally   #Anemia of chronic disease:  Hemoglobin noted to be 11.8 with elevated MCV and MCH - Check vitamin B12  245,  and folate 5.5 -Will give Vit O16, folic acid once able to tolerate  #Diabetes mellitus type 2:  Last hemoglobin A1c noted to be 5.5 on 02/28/2016. Continue to CBG Q4H as pt is NPO  #Closed fracture distal end of left fibula 2/2 fall:  Called ortho tech for ACE wrap, splint Pain management with IV morphine  Patient was set up to be followed up in the outpatient setting with Dr. Fredonia Highland of orthopedics on 12/14, consider calling ortho if pt when pt is more stable     Code Status: Full  Family Communication: None at bedside  Disposition Plan: Once stable   Consultants:  Nephrology  Neurology  Procedures:  None  Antimicrobials:  IV Vanc + IV Zosyn  DVT prophylaxis:  [place] heparin   Objective: Vitals:   01/23/17 1600 01/23/17 1956 01/24/17 0002 01/24/17 0335  BP: (!) 144/58 (!) 144/75 129/71 (!) 158/73  Pulse: 92 100 74 84  Resp: 18 18 20 19   Temp:  99.2 F (37.3 C) 99.5 F (37.5 C) 99.3 F (37.4 C)  TempSrc:  Oral Oral Oral  SpO2: 93% 95% 93% 94%  Weight:      Height:       No intake or output data in the 24 hours ending 01/24/17 1115 Filed Weights   01/21/17 0700 01/22/17 1325 01/22/17 1700  Weight: 93.7 kg (206 lb 9.1 oz) 89.6 kg (197 lb 8.5 oz) 88.9 kg (195 lb 15.8 oz)    Exam:   General: Lethargic, easily arousable, alert, awake, oriented to self, able to follow commands  Cardiovascular: S1-S2 present, no added heart sounds  Respiratory: Mild crackles at the bases  Abdomen: Soft, nontender, bowel sounds present, large midline scar noted  Musculoskeletal: No pedal edema bilaterally  Skin: Normal  Psychiatry: Unable to  assess  Neuro: Moves all limbs, withdraws from pain   Data Reviewed: CBC: Recent Labs  Lab 01/20/17 2252 01/21/17 1013 01/22/17 0304 01/23/17 0607 01/24/17 0245  WBC 15.5* 16.5* 16.0* 15.2* 12.9*  NEUTROABS 13.5*  --  13.8* 12.6* 9.6*  HGB 11.8* 12.1* 12.2* 12.3* 11.2*  HCT 35.0* 36.9*  36.6* 37.0* 38.2* 34.2*  MCV 104.2* 105.4* 104.2* 107.0* 107.2*  PLT 279 240 246 233 073   Basic Metabolic Panel: Recent Labs  Lab 01/20/17 1815 01/20/17 2252 01/21/17 1013  01/22/17 1147 01/22/17 1330  01/23/17 0055 01/23/17 0607 01/23/17 0857 01/24/17 0245 01/24/17 0905  NA 139  --  139   < > 141 139  --   --  139  --  138 137  K 6.8*  --  4.9   < > >7.5* 5.5*   < > 4.6 5.0 4.8 4.9 4.9  CL 104  --  103   < > 107 102  --   --  97*  --  98* 96*  CO2 18*  --  19*   < > 13* 20*  --   --  20*  --  23 23  GLUCOSE 97  --  116*   < > 108* 121*  --   --  107*  --  100* 97  BUN 86*  --  63*   < > 101* 93*  --   --  59*  --  78* 82*  CREATININE 14.38*  --  11.13*   < > 15.33* 14.01*  --   --  9.94*  --  11.98* 12.65*  CALCIUM 9.9  --  9.4   < > 9.7 9.4  --   --  9.2  --  9.0 9.1  MG 2.3  --   --   --   --   --   --   --   --   --   --   --   PHOS  --  6.8* 6.5*  --   --   --   --   --   --   --   --   --    < > = values in this interval not displayed.   GFR: Estimated Creatinine Clearance: 6.1 mL/min (A) (by C-G formula based on SCr of 12.65 mg/dL (H)). Liver Function Tests: Recent Labs  Lab 01/20/17 1815 01/21/17 1013 01/22/17 1147  AST 24  --  26  ALT 15*  --  14*  ALKPHOS 55  --  42  BILITOT 1.1  --  1.5*  PROT 7.5  --  6.9  ALBUMIN 4.1 3.7 3.6   No results for input(s): LIPASE, AMYLASE in the last 168 hours. Recent Labs  Lab 01/20/17 2252 01/24/17 0905  AMMONIA 34 52*   Coagulation Profile: No results for input(s): INR, PROTIME in the last 168 hours. Cardiac Enzymes: Recent Labs  Lab 01/20/17 2252  CKTOTAL 622*   BNP (last 3 results) No results for input(s):  PROBNP in the last 8760 hours. HbA1C: No results for input(s): HGBA1C in the last 72 hours. CBG: Recent Labs  Lab 01/23/17 2011 01/24/17 0006 01/24/17 0337 01/24/17 0515 01/24/17 0815  GLUCAP 146* 120* 113* 92 91   Lipid Profile: No results for input(s): CHOL, HDL, LDLCALC, TRIG, CHOLHDL, LDLDIRECT in the last 72 hours. Thyroid Function Tests: No results for input(s): TSH, T4TOTAL, FREET4, T3FREE, THYROIDAB in the last 72 hours. Anemia Panel: No results for input(s): VITAMINB12, FOLATE, FERRITIN, TIBC, IRON, RETICCTPCT in the last 72 hours. Urine analysis:    Component Value Date/Time   COLORURINE YELLOW 12/15/2016 1736   APPEARANCEUR CLEAR 12/15/2016 1736   LABSPEC 1.011 12/15/2016 1736   PHURINE 7.0 12/15/2016 1736   GLUCOSEU 50 (A) 12/15/2016 1736   HGBUR NEGATIVE 12/15/2016 1736   BILIRUBINUR NEGATIVE 12/15/2016 1736   KETONESUR NEGATIVE 12/15/2016 1736   PROTEINUR >=300 (A) 12/15/2016 1736   UROBILINOGEN 0.2 09/08/2007 1345   NITRITE NEGATIVE 12/15/2016 1736   LEUKOCYTESUR NEGATIVE 12/15/2016 1736   Sepsis Labs: @LABRCNTIP (procalcitonin:4,lacticidven:4)  ) Recent Results (from the past 240 hour(s))  Culture, blood (x 2)     Status: None (Preliminary result)   Collection Time: 01/21/17  3:25 AM  Result Value Ref Range Status   Specimen Description BLOOD LEFT WRIST  Final   Special Requests   Final    BOTTLES DRAWN AEROBIC AND ANAEROBIC Blood Culture adequate volume   Culture NO GROWTH 2 DAYS  Final   Report Status PENDING  Incomplete  Culture, blood (x 2)     Status: None (Preliminary result)   Collection Time: 01/21/17 10:10 AM  Result Value Ref Range Status   Specimen Description BLOOD LEFT ARM  Final   Special Requests   Final    BOTTLES DRAWN AEROBIC AND ANAEROBIC Blood Culture results may not be optimal due to an excessive volume of blood received in culture bottles   Culture NO GROWTH 2 DAYS  Final   Report Status PENDING  Incomplete  MRSA PCR  Screening     Status: None   Collection Time: 01/21/17  4:24 PM  Result Value Ref Range Status   MRSA by PCR NEGATIVE NEGATIVE Final    Comment:        The GeneXpert MRSA Assay (FDA approved for NASAL specimens only), is one component of a comprehensive MRSA colonization surveillance program. It is not intended to diagnose MRSA infection nor to guide or monitor treatment for MRSA infections.       Studies: Ct Head Wo Contrast  Result Date: 01/24/2017 CLINICAL DATA:  Initial evaluation for acute altered mental status. EXAM: CT HEAD WITHOUT CONTRAST TECHNIQUE: Contiguous axial images were obtained from the base of the skull through the vertex without intravenous contrast. COMPARISON:  Prior MRI from 01/21/2017. FINDINGS: Brain: Atrophy with chronic small vessel ischemic disease. Remote lacunar infarct present within the right basal ganglia. No acute intracranial hemorrhage. No evidence for acute large vessel territory infarct. No mass lesion, midline shift or mass effect. No hydrocephalus. No extra-axial fluid collection. Vascular: No hyperdense vessel. Scattered vascular calcifications noted within the carotid siphons. Skull: Scalp soft tissues and calvarium within normal limits. Sinuses/Orbits: Globes and orbital soft tissues within normal limits. Paranasal sinuses and mastoid air cells are clear. Other: None. IMPRESSION: 1. No acute intracranial abnormality. 2. Moderate atrophy with chronic small vessel ischemic disease, stable. Electronically Signed   By: Jeannine Boga M.D.   On: 01/24/2017 06:22    Scheduled Meds: . alteplase  4 mg Intracatheter Once  . calcitRIOL  2 mcg Oral Q M,W,F  . calcium gluconate  1 g Intravenous Once  . chlorproMAZINE (THORAZINE) injection  25 mg Intramuscular Once  . cinacalcet  120 mg Oral Once per day on Mon Wed Fri  . dextrose  1 ampule Intravenous Once  . heparin  5,000 Units Subcutaneous Q8H  . insulin aspart  0-15 Units Subcutaneous Q4H  .  insulin aspart  10 Units Intravenous Once  . pantoprazole (PROTONIX) IV  40 mg Intravenous QHS    Continuous Infusions: . piperacillin-tazobactam (ZOSYN)  IV Stopped (01/24/17 0910)  . vancomycin Stopped (01/22/17 1808)     LOS: 4 days     Alma Friendly, MD [facility]   If 7PM-7AM, please contact night-coverage [email or web address] Password [email or web address] 01/24/2017, 11:15 AM

## 2017-01-24 NOTE — Progress Notes (Addendum)
Paged by bedside RN regarding an acute mental status change. According to the bedside RN the pt had been alert and talkative approximately 66min before becoming unresponsive. RRN at bedside and noted that the pt was withdrawing equally to pain. VSS. The pt had also received 8mg  of Morphine IV at 2000 and was given 1 dose of Narcan. Upon entering the room Neurology MD Rory Percy was at bedside assessing pt. Per Neurology a stat noncontrast Ct of the head was ordered and stat BMP and ammonia level were ordered as well. Before exiting the room pt was opening eyes and responding to commands.  Assessment and Plan 1. Altered Mental Status- A stat noncontrast Ct of the head was ordered and stat BMP and ammonia level were ordered as well. AMS possibly due to uremia as well. Will consult with Nephrology about possible dialysis today. Appreciate Neurology's recommendations.  Arby Barrette NP-C, AGPCNP-BC [facility] [phone]   Addendum Stat head CT revealed no acute intracranial findings. RRN also notes that pt is more awake and becoming more talkative. VS remain stable. We will continue to monitor

## 2017-01-24 NOTE — Progress Notes (Addendum)
Neurology Progress Note   S:// Called by rapid response RN for acute change in mental status. Patient was talkative at the beginning of the nursing night shift but became acutely unresponsive this morning. Rapid response was called.  Upon initial assessment by rapid response RN, patient was not talking unresponsive and was only withdrawing equally to pain. Of note, patient received his dialysis Friday.  He was also given a dose of morphine at 8 PM. I saw and evaluated the patient at bedside.  O:// Current vital signs: BP (!) 158/73 (BP Location: Left Arm)   Pulse 84   Temp 99.3 F (37.4 C) (Oral)   Resp 19   Ht 5\' 8"  (1.727 m)   Wt 88.9 kg (195 lb 15.8 oz)   SpO2 94%   BMI 29.80 kg/m  Vital signs in last 24 hours: Temp:  [99.1 F (37.3 C)-99.5 F (37.5 C)] 99.3 F (37.4 C) (12/16 0335) Pulse Rate:  [74-100] 84 (12/16 0335) Resp:  [18-23] 19 (12/16 0335) BP: (129-158)/(58-101) 158/73 (12/16 0335) SpO2:  [92 %-95 %] 94 % (12/16 0335) GENERAL: Drowsy, opens eyes to loud voice and noxious stimulus.  No acute distress. HEENT: Normocephalic, atraumatic Lungs clear to auscultation Cardiovascular: S1-S2 heard, regular rate rhythm Abdomen soft nontender nondistended Neurological exam Drowsy, opens eyes to loud voice and noxious edematous.  Actively resists eye opening when I attempted to check his pupils. Initially nonverbal, but then came about within a minute and responded appropriately.  Requested a blanket as he was feeling cold. Cranial nerves: Pupils equal round reactive to light, no gaze preference, external movements intact, visual fields full, when he was not responding to voice-it did appear that he might have had a susceptible left lower facial weakness. Motor exam: Withdraws all 4 briskly to pain and later able to hold all 4 antigravity with the exception of the left lower extremity that is in bandage because of the fracture. Also noted asterixis in bilateral outstretched  upper extremities. Sensory exam: Intact to noxious stimulus as evidenced by brisk withdrawal Did not perform finger-nose-finger, gait testing.  Medications  Current Facility-Administered Medications:  .  acetaminophen (TYLENOL) tablet 650 mg, 650 mg, Oral, Q6H PRN **OR** acetaminophen (TYLENOL) suppository 650 mg, 650 mg, Rectal, Q6H PRN, Smith, Rondell A, MD .  alteplase (CATHFLO ACTIVASE) injection 4 mg, 4 mg, Intracatheter, Once, Dwana Melena, MD .  calcitRIOL (ROCALTROL) capsule 2 mcg, 2 mcg, Oral, Q M,W,F, Dwana Melena, MD .  calcium gluconate inj 10% (1 g) URGENT USE ONLY!, 1 g, Intravenous, Once, Georgette Shell, MD .  chlorproMAZINE (THORAZINE) injection 25 mg, 25 mg, Intramuscular, Once, Esterwood, Amy S, PA-C .  cinacalcet (SENSIPAR) tablet 120 mg, 120 mg, Oral, Once per day on Mon Wed Fri, Lin, James W, MD .  dextrose 50 % solution 50 mL, 1 ampule, Intravenous, Once, Georgette Shell, MD .  heparin injection 5,000 Units, 5,000 Units, Subcutaneous, Q8H, Fuller Plan A, MD, 5,000 Units at 01/23/17 1849 .  hydrALAZINE (APRESOLINE) injection 10 mg, 10 mg, Intravenous, Q4H PRN, Fuller Plan A, MD, 10 mg at 01/22/17 1054 .  insulin aspart (novoLOG) injection 0-15 Units, 0-15 Units, Subcutaneous, Q4H, Alma Friendly, MD, 2 Units at 01/23/17 2036 .  insulin aspart (novoLOG) injection 10 Units, 10 Units, Intravenous, Once, Georgette Shell, MD .  ipratropium-albuterol (DUONEB) 0.5-2.5 (3) MG/3ML nebulizer solution 3 mL, 3 mL, Nebulization, Q4H PRN, Smith, Rondell A, MD .  ondansetron (ZOFRAN) tablet 4 mg, 4 mg,  Oral, Q6H PRN **OR** ondansetron (ZOFRAN) injection 4 mg, 4 mg, Intravenous, Q6H PRN, Fuller Plan A, MD, 4 mg at 01/23/17 0413 .  pantoprazole (PROTONIX) injection 40 mg, 40 mg, Intravenous, QHS, Esterwood, Amy S, PA-C, 40 mg at 01/23/17 2127 .  piperacillin-tazobactam (ZOSYN) IVPB 3.375 g, 3.375 g, Intravenous, Q12H, Janett Billow, RPH, Last Rate: 12.5  mL/hr at 01/24/17 0503, 3.375 g at 01/24/17 0503 .  vancomycin (VANCOCIN) IVPB 1000 mg/200 mL premix, 1,000 mg, Intravenous, Q M,W,F-HD, Kris Mouton, [facility], Stopped at 01/22/17 1808 Labs CBC    Component Value Date/Time   WBC 12.9 (H) 01/24/2017 0245   RBC 3.19 (L) 01/24/2017 0245   HGB 11.2 (L) 01/24/2017 0245   HCT 34.2 (L) 01/24/2017 0245   HCT 36.6 (L) 01/21/2017 1013   PLT 223 01/24/2017 0245   MCV 107.2 (H) 01/24/2017 0245   MCH 35.1 (H) 01/24/2017 0245   MCHC 32.7 01/24/2017 0245   RDW 15.6 (H) 01/24/2017 0245   LYMPHSABS 1.4 01/24/2017 0245   MONOABS 1.8 (H) 01/24/2017 0245   EOSABS 0.1 01/24/2017 0245   BASOSABS 0.0 01/24/2017 0245    CMP     Component Value Date/Time   NA 138 01/24/2017 0245   K 4.9 01/24/2017 0245   CL 98 (L) 01/24/2017 0245   CO2 23 01/24/2017 0245   GLUCOSE 100 (H) 01/24/2017 0245   BUN 78 (H) 01/24/2017 0245   CREATININE 11.98 (H) 01/24/2017 0245   CALCIUM 9.0 01/24/2017 0245   PROT 6.9 01/22/2017 1147   ALBUMIN 3.6 01/22/2017 1147   AST 26 01/22/2017 1147   ALT 14 (L) 01/22/2017 1147   ALKPHOS 42 01/22/2017 1147   BILITOT 1.5 (H) 01/22/2017 1147   GFRNONAA 4 (L) 01/24/2017 0245   GFRAA 4 (L) 01/24/2017 0245   Imaging no new images to review CT from 01/20/2017 and MRI from 01/21/2017 did not show any acute changes.  Assessment:  66 year old man with end-stage renal disease, hypertension, hyperlipidemia who was brought in initially on 01/20/2017 after having been found down and deemed to have toxic metabolic encephalopathy and uremia because of missing dialysis with a creatinine of 14.8 and BUN of 86. He was doing well after having been dialyzed but had sudden onset of decreased level of consciousness, which was intermittent.  He was given a dose of morphine at 8 PM.  Rapid response was called.  They evaluated the patient and give him a dose of Narcan. Upon my examination, initially continued to be drowsy but then he woke up and was  following commands and answering appropriately. I suspect that this is again toxic metabolic encephalopathy secondary to his underlying renal dysfunction and also from opiates. I would recommend getting a CT of the head to rule out any acute process  Impression: Toxic metabolic encephalopathy Uremia  Recommendations: I would recommend a stat noncontrast CT of the head to rule out any acute process. Repeat BMP and ammonia level. If CT head is negative, I would recommend correction of his metabolic derangements per nephrology service and primary service as you are. D/C Morphine Neurology service will be available as needed. We will provide further recommendations of imaging is abnormal.  -- Amie Portland, MD [facility] [phone] If 7pm to 7am, please call on call as listed on AMION.  ADDENDUM AFTER IMAGING CTH reviewed - no acute changes. Recs stay as above.  Amie Portland, MD [facility] [phone] If 7pm to 7am, please call on call as listed on AMION.

## 2017-01-24 NOTE — Progress Notes (Signed)
At around 0500 went in to give pt morning meds. Pt noted to have right sided facial droop and change in mental status. Pt very lethargic and not responding to sternal rub. RRN and NP notified. Neurology. RRN, and NP came to assess pt at bedside. New orders placed. Will continue to monitor. Isac Caddy, RN

## 2017-01-24 NOTE — Progress Notes (Signed)
Pt O2 dropped down in 70's while sleeping. Pt placed on 2L O2 and placed in upright position. Stats increased to 96%. Will continue to monitor. Isac Caddy, RN

## 2017-01-25 ENCOUNTER — Encounter (HOSPITAL_COMMUNITY): Payer: Self-pay

## 2017-01-25 ENCOUNTER — Other Ambulatory Visit: Payer: Self-pay

## 2017-01-25 DIAGNOSIS — R066 Hiccough: Secondary | ICD-10-CM

## 2017-01-25 LAB — CBC WITH DIFFERENTIAL/PLATELET
BASOS ABS: 0 10*3/uL (ref 0.0–0.1)
BASOS PCT: 0 %
EOS ABS: 0.3 10*3/uL (ref 0.0–0.7)
EOS PCT: 3 %
HCT: 33.8 % — ABNORMAL LOW (ref 39.0–52.0)
Hemoglobin: 11.2 g/dL — ABNORMAL LOW (ref 13.0–17.0)
Lymphocytes Relative: 18 %
Lymphs Abs: 2 10*3/uL (ref 0.7–4.0)
MCH: 34.8 pg — ABNORMAL HIGH (ref 26.0–34.0)
MCHC: 33.1 g/dL (ref 30.0–36.0)
MCV: 105 fL — ABNORMAL HIGH (ref 78.0–100.0)
MONO ABS: 0.8 10*3/uL (ref 0.1–1.0)
Monocytes Relative: 7 %
NEUTROS ABS: 8 10*3/uL — AB (ref 1.7–7.7)
Neutrophils Relative %: 72 %
PLATELETS: 243 10*3/uL (ref 150–400)
RBC: 3.22 MIL/uL — ABNORMAL LOW (ref 4.22–5.81)
RDW: 14.8 % (ref 11.5–15.5)
WBC: 11 10*3/uL — ABNORMAL HIGH (ref 4.0–10.5)

## 2017-01-25 LAB — BASIC METABOLIC PANEL
ANION GAP: 13 (ref 5–15)
BUN: 48 mg/dL — ABNORMAL HIGH (ref 6–20)
CALCIUM: 8.8 mg/dL — AB (ref 8.9–10.3)
CO2: 25 mmol/L (ref 22–32)
CREATININE: 8.85 mg/dL — AB (ref 0.61–1.24)
Chloride: 97 mmol/L — ABNORMAL LOW (ref 101–111)
GFR calc Af Amer: 6 mL/min — ABNORMAL LOW (ref >60)
GFR, EST NON AFRICAN AMERICAN: 5 mL/min — AB (ref >60)
Glucose, Bld: 140 mg/dL — ABNORMAL HIGH (ref 65–99)
Potassium: 4.3 mmol/L (ref 3.5–5.1)
Sodium: 135 mmol/L (ref 135–145)

## 2017-01-25 LAB — GLUCOSE, CAPILLARY
GLUCOSE-CAPILLARY: 111 mg/dL — AB (ref 65–99)
GLUCOSE-CAPILLARY: 117 mg/dL — AB (ref 65–99)
GLUCOSE-CAPILLARY: 157 mg/dL — AB (ref 65–99)
Glucose-Capillary: 121 mg/dL — ABNORMAL HIGH (ref 65–99)
Glucose-Capillary: 98 mg/dL (ref 65–99)

## 2017-01-25 MED ORDER — ATORVASTATIN CALCIUM 20 MG PO TABS
20.0000 mg | ORAL_TABLET | Freq: Every day | ORAL | Status: DC
  Administered 2017-01-25 – 2017-02-03 (×10): 20 mg via ORAL
  Filled 2017-01-25 (×11): qty 1

## 2017-01-25 MED ORDER — CALCIUM ACETATE (PHOS BINDER) 667 MG PO CAPS
1334.0000 mg | ORAL_CAPSULE | Freq: Three times a day (TID) | ORAL | Status: DC
  Administered 2017-01-25 – 2017-02-03 (×23): 1334 mg via ORAL
  Filled 2017-01-25 (×22): qty 2

## 2017-01-25 MED ORDER — INSULIN ASPART 100 UNIT/ML ~~LOC~~ SOLN
0.0000 [IU] | Freq: Three times a day (TID) | SUBCUTANEOUS | Status: DC
  Administered 2017-01-26 (×2): 2 [IU] via SUBCUTANEOUS
  Administered 2017-01-27: 5 [IU] via SUBCUTANEOUS
  Administered 2017-01-29: 3 [IU] via SUBCUTANEOUS
  Administered 2017-01-30 – 2017-01-31 (×3): 2 [IU] via SUBCUTANEOUS
  Administered 2017-02-01 – 2017-02-02 (×3): 3 [IU] via SUBCUTANEOUS
  Administered 2017-02-03: 2 [IU] via SUBCUTANEOUS

## 2017-01-25 MED ORDER — FOLIC ACID 1 MG PO TABS
1.0000 mg | ORAL_TABLET | Freq: Every day | ORAL | Status: DC
  Administered 2017-01-25 – 2017-02-02 (×9): 1 mg via ORAL
  Filled 2017-01-25 (×9): qty 1

## 2017-01-25 MED ORDER — TAMSULOSIN HCL 0.4 MG PO CAPS
0.4000 mg | ORAL_CAPSULE | Freq: Every day | ORAL | Status: DC
  Administered 2017-01-25 – 2017-02-03 (×10): 0.4 mg via ORAL
  Filled 2017-01-25 (×11): qty 1

## 2017-01-25 MED ORDER — PANTOPRAZOLE SODIUM 40 MG PO TBEC
40.0000 mg | DELAYED_RELEASE_TABLET | Freq: Every day | ORAL | Status: DC
  Administered 2017-01-25 – 2017-02-03 (×10): 40 mg via ORAL
  Filled 2017-01-25 (×10): qty 1

## 2017-01-25 MED ORDER — AMLODIPINE BESYLATE 10 MG PO TABS
10.0000 mg | ORAL_TABLET | Freq: Every day | ORAL | Status: DC
  Administered 2017-01-25 – 2017-02-03 (×9): 10 mg via ORAL
  Filled 2017-01-25 (×10): qty 1

## 2017-01-25 MED ORDER — CYANOCOBALAMIN 1000 MCG/ML IJ SOLN
1000.0000 ug | Freq: Once | INTRAMUSCULAR | Status: AC
  Administered 2017-01-25: 1000 ug via INTRAMUSCULAR
  Filled 2017-01-25: qty 1

## 2017-01-25 NOTE — Progress Notes (Signed)
[facility] ROUNDING NOTE   Subjective:   Interval History:  ESRD on HD(MWF), HTN, DM, HLD;who presents after being found downat his homewhen he did not show up for his routine scheduled dialysis  He is currently seen by GI and speech for esophageal dysphagia and was prescribed broad spectrum empiric antibiotics IV Vanco and IV zosyn  Confused but eating breakfast  Not answering questions appropriately  Has received aggressive daily dialysis to see if uremia is a contributing factor  MWF [facility] 4h 32min    2/2.5 bath  95.5kg  R IJ cath/ R BC AVF (excision / repair of some scarred areas 11/05/16 Dr Donzetta Matters / on 11/19 Dr Scot Dock percut venoplasty )    Objective:  Vital signs in last 24 hours:  Temp:  [98.9 F (37.2 C)-99.6 F (37.6 C)] 99.5 F (37.5 C) (12/17 0503) Pulse Rate:  [76-87] 84 (12/17 0503) Resp:  [14-21] 16 (12/17 0503) BP: (134-156)/(71-80) 150/80 (12/17 0503) SpO2:  [91 %-95 %] 91 % (12/17 0503) Weight:  [202 lb 9.6 oz (91.9 kg)] 202 lb 9.6 oz (91.9 kg) (12/16 2245)  Weight change:  Filed Weights   01/22/17 1700 01/24/17 2010 01/24/17 2245  Weight: 195 lb 15.8 oz (88.9 kg) 202 lb 9.6 oz (91.9 kg) 202 lb 9.6 oz (91.9 kg)    Intake/Output: I/O last 3 completed shifts: In: 100 [IV Piggyback:100] Out: 0    Intake/Output this shift:  No intake/output data recorded.  CVS- RRR RS- CTA ABD- BS present soft non-distended EXT- no edema   Basic Metabolic Panel: Recent Labs  Lab 01/20/17 1815 01/20/17 2252 01/21/17 1013  01/22/17 1330  01/23/17 0607 01/23/17 0857 01/24/17 0245 01/24/17 0905 01/25/17 0306  NA 139  --  139   < > 139  --  139  --  138 137 135  K 6.8*  --  4.9   < > 5.5*   < > 5.0 4.8 4.9 4.9 4.3  CL 104  --  103   < > 102  --  97*  --  98* 96* 97*  CO2 18*  --  19*   < > 20*  --  20*  --  23 23 25   GLUCOSE 97  --  116*   < > 121*  --  107*  --  100* 97 140*  BUN 86*  --  63*   < > 93*  --  59*  --  78* 82* 48*  CREATININE  14.38*  --  11.13*   < > 14.01*  --  9.94*  --  11.98* 12.65* 8.85*  CALCIUM 9.9  --  9.4   < > 9.4  --  9.2  --  9.0 9.1 8.8*  MG 2.3  --   --   --   --   --   --   --   --   --   --   PHOS  --  6.8* 6.5*  --   --   --   --   --   --   --   --    < > = values in this interval not displayed.    Liver Function Tests: Recent Labs  Lab 01/20/17 1815 01/21/17 1013 01/22/17 1147  AST 24  --  26  ALT 15*  --  14*  ALKPHOS 55  --  42  BILITOT 1.1  --  1.5*  PROT 7.5  --  6.9  ALBUMIN 4.1 3.7 3.6  No results for input(s): LIPASE, AMYLASE in the last 168 hours. Recent Labs  Lab 01/20/17 2252 01/24/17 0905  AMMONIA 34 52*    CBC: Recent Labs  Lab 01/20/17 2252  01/22/17 0304 01/23/17 0607 01/24/17 0245 01/24/17 2122 01/25/17 0306  WBC 15.5*   < > 16.0* 15.2* 12.9* 13.3* 11.0*  NEUTROABS 13.5*  --  13.8* 12.6* 9.6*  --  8.0*  HGB 11.8*   < > 12.2* 12.3* 11.2* 10.6* 11.2*  HCT 35.0*   < > 37.0* 38.2* 34.2* 32.9* 33.8*  MCV 104.2*   < > 104.2* 107.0* 107.2* 105.4* 105.0*  PLT 279   < > 246 233 223 265 243   < > = values in this interval not displayed.    Cardiac Enzymes: Recent Labs  Lab 01/20/17 2252  CKTOTAL 622*    BNP: Invalid input(s): POCBNP  CBG: Recent Labs  Lab 01/24/17 1209 01/24/17 1642 01/24/17 2015 01/25/17 0105 01/25/17 0506  GLUCAP 85 173* 125* 117* 121*    Microbiology: Results for orders placed or performed during the hospital encounter of 01/20/17  Culture, blood (x 2)     Status: None (Preliminary result)   Collection Time: 01/21/17  3:25 AM  Result Value Ref Range Status   Specimen Description BLOOD LEFT WRIST  Final   Special Requests   Final    BOTTLES DRAWN AEROBIC AND ANAEROBIC Blood Culture adequate volume   Culture NO GROWTH 3 DAYS  Final   Report Status PENDING  Incomplete  Culture, blood (x 2)     Status: None (Preliminary result)   Collection Time: 01/21/17 10:10 AM  Result Value Ref Range Status   Specimen Description  BLOOD LEFT ARM  Final   Special Requests   Final    BOTTLES DRAWN AEROBIC AND ANAEROBIC Blood Culture results may not be optimal due to an excessive volume of blood received in culture bottles   Culture NO GROWTH 3 DAYS  Final   Report Status PENDING  Incomplete  MRSA PCR Screening     Status: None   Collection Time: 01/21/17  4:24 PM  Result Value Ref Range Status   MRSA by PCR NEGATIVE NEGATIVE Final    Comment:        The GeneXpert MRSA Assay (FDA approved for NASAL specimens only), is one component of a comprehensive MRSA colonization surveillance program. It is not intended to diagnose MRSA infection nor to guide or monitor treatment for MRSA infections.     Coagulation Studies: No results for input(s): LABPROT, INR in the last 72 hours.  Urinalysis: No results for input(s): COLORURINE, LABSPEC, PHURINE, GLUCOSEU, HGBUR, BILIRUBINUR, KETONESUR, PROTEINUR, UROBILINOGEN, NITRITE, LEUKOCYTESUR in the last 72 hours.  Invalid input(s): APPERANCEUR    Imaging: Ct Head Wo Contrast  Result Date: 01/24/2017 CLINICAL DATA:  Initial evaluation for acute altered mental status. EXAM: CT HEAD WITHOUT CONTRAST TECHNIQUE: Contiguous axial images were obtained from the base of the skull through the vertex without intravenous contrast. COMPARISON:  Prior MRI from 01/21/2017. FINDINGS: Brain: Atrophy with chronic small vessel ischemic disease. Remote lacunar infarct present within the right basal ganglia. No acute intracranial hemorrhage. No evidence for acute large vessel territory infarct. No mass lesion, midline shift or mass effect. No hydrocephalus. No extra-axial fluid collection. Vascular: No hyperdense vessel. Scattered vascular calcifications noted within the carotid siphons. Skull: Scalp soft tissues and calvarium within normal limits. Sinuses/Orbits: Globes and orbital soft tissues within normal limits. Paranasal sinuses and mastoid air cells are clear.  Other: None. IMPRESSION: 1. No  acute intracranial abnormality. 2. Moderate atrophy with chronic small vessel ischemic disease, stable. Electronically Signed   By: Jeannine Boga M.D.   On: 01/24/2017 06:22     Medications:   . piperacillin-tazobactam (ZOSYN)  IV Stopped (01/25/17 1006)   . alteplase  4 mg Intracatheter Once  . calcitRIOL  2 mcg Oral Q M,W,F  . calcium gluconate  1 g Intravenous Once  . chlorproMAZINE (THORAZINE) injection  25 mg Intramuscular Once  . cinacalcet  120 mg Oral Once per day on Mon Wed Fri  . dextrose  1 ampule Intravenous Once  . heparin  5,000 Units Subcutaneous Q8H  . insulin aspart  0-15 Units Subcutaneous Q4H  . insulin aspart  10 Units Intravenous Once  . pantoprazole (PROTONIX) IV  40 mg Intravenous QHS   acetaminophen **OR** acetaminophen, hydrALAZINE, ipratropium-albuterol, ondansetron **OR** ondansetron (ZOFRAN) IV  Assessment/ Plan:  1. Altered mental status - worse again, azotemia worsening, needs more HD. Plan HD today and tomorrow. IV antibiotics  He has been seen by neurology  Who suspect that uremia is contributing to altered mental status 2. ESRD HD MWF 3. MBD of CKD - cont meds when able to swallow  4. Anemia of CKD - Hb at goal 5. DM per primary 6. COPD 7. Volume - CXR clear , no vol on exam, under dry wt 8. Dysphagia  Speech pathology consulted       LOS: 5 Latasha Puskas W @TODAY @10 :33 AM

## 2017-01-25 NOTE — Care Management Important Message (Signed)
Important Message  Patient Details  Name: [name] MRN: [id number] Date of Birth: Feb 03, 1951   Medicare Important Message Given:  Yes    Nathen May 01/25/2017, 10:17 AM

## 2017-01-25 NOTE — Plan of Care (Signed)
  Education: Knowledge of General Education information will improve 01/25/2017 0225 - Progressing by Peggye Pitt, RN   Pain Managment: General experience of comfort will improve 01/25/2017 0225 - Progressing by Peggye Pitt, RN   Health Behavior/Discharge Planning: Ability to manage health-related needs will improve 01/25/2017 0225 - Not Progressing by Peggye Pitt, RN   Activity: Risk for activity intolerance will decrease 01/25/2017 0225 - Not Progressing by Peggye Pitt, RN

## 2017-01-25 NOTE — Progress Notes (Signed)
  Speech Language Pathology Treatment: Dysphagia  Patient Details Name: [name] MRN: [id number] DOB: [birth year] Today's Date: 01/25/2017 Time: 2947-6546 SLP Time Calculation (min) (ACUTE ONLY): 11 min  Assessment / Plan / Recommendation Clinical Impression  Pt continues with frequent hiccups. Observed with thin liquid and regular texture without cough but increased risk of aspiration due to poor coordination of respiration/swallow with hiccups. Educated/discussed advancement of texture and agreed to trial Dys 3 texture when sitting upright, small sips, pills whole in puree. ST will follow up.    HPI HPI: [name] a 66 y.o.malewith medical history significant ofESRD on HD(MWF), HTN, DM, HLD, GERD;who presents after being found downat his homewhen he did not show up for his routine scheduled dialysis. Patient was recently seen in the emergency departmenton 12/11/18after suffering a fallfound to have a fracturetothe distal end of his left fibula.CT scan of the brain showed no acute intracranial findings. Admitted with acute toxic metabolic encephalopathy of unclear etiology, SIRS/sepsis.      SLP Plan  Continue with current plan of care       Recommendations  Diet recommendations: Dysphagia 3 (mechanical soft);Thin liquid Liquids provided via: Cup;Straw Medication Administration: Whole meds with puree Supervision: Patient able to self feed;Intermittent supervision to cue for compensatory strategies Compensations: Slow rate;Small sips/bites Postural Changes and/or Swallow Maneuvers: Seated upright 90 degrees;Upright 30-60 min after meal                Oral Care Recommendations: Oral care BID Follow up Recommendations: None SLP Visit Diagnosis: Dysphagia, unspecified (R13.10) Plan: Continue with current plan of care                       [name] 01/25/2017, 9:16 AM   [name].Ed [employer] [phone]

## 2017-01-25 NOTE — Progress Notes (Signed)
PROGRESS NOTE  [name] [id number] DOB: [birth year] DOA: 01/20/2017 PCP: [name]  HPI/Recap of past 24 hours: HPI from [name] on 01/20/17   [name] a 66 y.o.malewith medical history significant ofESRD on HD(MWF), HTN, DM, HLD; who presents after being found down at his home when he did not show up for his routine scheduled dialysis.  Patient's daughter provided history as he is acutely altered.  She reports receiving multiple calls from hemodialysis today stating that the patient missed his appointment.  She went over to his home to check on him and found him on the floor minimally responsive.Patient was seen in the emergency department on 01/19/17 after suffering a fall found to have a fracture to the distal end of his left fibula. It appears he received a cast and was sent home with pain medications of Tylenol to follow-up with orthopedics in the outpatient setting. Upon admission to the ED, patient was seen to be afebrile pulse 82-110, respirations 24-27, blood pressure 179/97-208/97, and O2 saturations 96-99% on room air. Labs revealed potassium 6.8, BUN 86, creatinine 14.38, anion gap 17.  Chest x-ray showed no acute abnormalities.  CT scan of the brain showed no acute intracranial findings.  EKG showed signs of T wave peaking.  Patient received 1 g of calcium gluconate, 50 mL of dextrose 50% solution, amp of sodium bicarb, and 10 units of insulin on the ED. Neurology was consulted due to patient's altered mental status. Nephrology was also consulted for need of dialysis. Pt admitted for further management  Today, pt noted to be more awake, alert, less confused, able to follow commands. Multiple hiccups noted. Otherwise, denies any chest pain, SOB, abdominal pain, V/D/C, fever/chills   Assessment/[name]: Principal Problem:   Hyperkalemia Active Problems:   Diabetes mellitus (HCC)   End stage renal disease on dialysis (HCC)   SIRS (systemic  inflammatory response syndrome) ([place])   Hypertensive urgency   Closed fracture of distal end of left fibula   Anemia due to chronic kidney disease   Acute metabolic encephalopathy   Abnormal swallowing   Altered mental status  #Acute toxic metabolic encephalopathy:  Ongoing Etiology, likely due to Uremia Vs infection Vs medication  CT scan: Atrophy and small vessel disease. No acute intracranial findings, repeat CT negative MRI showed: No acute intracranial abnormality. Moderate chronic small vessel ischemic disease. CXR no active disease Afebrile, with resolving leukocytosis BC NGTD, LA WNL, Procalcitonin 0.9-->1.31 EEG: Generalized irregular slow activity, triphasic waves Ammonia 34, CPK 622 ABG: PH 7.4, PCO2 33.8, PO2 82.4, HCO3 20.7 Neurochecks SLP consulted: Rec mechanical soft, thin liquid  Neurology on board, appreciate recs  #SIRS/Sepsis:  Afebrile, with resolving leukocytosis, ?? source - Sepsis protocol initiated - Blood cultures NGTD, LA WNL, procalcitonin 0.9 - Empiric antibiotics s/p IV Vanco, continue Zosyn, consider stopping after day 7. Daily CBC  #End-stage renal disease on HD:  Missed a couple of session due to weather Monday/Wednesday/Friday schedule Presented with potassium 6.8, BUN 86, and creatinine 14.38, not overloaded Continuous pulse oximetry with nasal cannula oxygen as needed Nephrology on board  #Hyperkalemia: Resolved Likely due to missed dialysis, 6.8 on admission.  EKG with signs of T wave peaking noted. Patient received 1 g of calcium gluconate, 50 mL of dextrose 50% solution, amp of sodium bicarb, and 10 units of insulin on the ED - Admit to a telemetry bed - Hemodialysis per nephrology Daily BMP  #Hypertensive urgency/emergency: Blood pressures noted to be as  high as 208/97 on admission.   - IV hydralazine as needed, amlodipine, held lisinopril   #Anemia of chronic disease:  Hemoglobin noted to be 11.8 with elevated MCV and  MCH -Vitamin B12  245,  and folate 5.5 -Start Vit Q76, folic acid  #Diabetes mellitus type 2:  Last hemoglobin A1c noted to be 5.5 on 02/28/2016. Continue to CBG, SSI  #Closed fracture distal end of left fibula 2/2 fall:  Called ortho tech for ACE wrap, splint  Patient was set up to be followed up in the outpatient setting with Paul. [place] [name] of orthopedics on 12/14, consider calling ortho if pt when pt is more stable     Code Status: Full  Family Communication: None at bedside  Disposition [name]: Once stable   Consultants:  Nephrology  Neurology  Procedures:  None  Antimicrobials:  S/P IV Vanc   IV Zosyn  DVT prophylaxis:  Bennett heparin   Objective: Vitals:   01/24/17 2245 01/24/17 2248 01/25/17 0048 01/25/17 0503  BP: 136/71 (!) 153/76 (!) 142/73 (!) 150/80  Pulse: 86 85 83 84  Resp: 14  19 16   Temp: 98.9 F (37.2 C)  99.6 F (37.6 C) 99.5 F (37.5 C)  TempSrc:   Oral Oral  SpO2:   91% 91%  Weight: 91.9 kg (202 lb 9.6 oz)     Height:        Intake/Output Summary (Last 24 hours) at 01/25/2017 1056 Last data filed at 01/25/2017 0554 Gross per 24 hour  Intake 100 ml  Output 0 ml  Net 100 ml   Filed Weights   01/22/17 1700 01/24/17 2010 01/24/17 2245  Weight: 88.9 kg (195 lb 15.8 oz) 91.9 kg (202 lb 9.6 oz) 91.9 kg (202 lb 9.6 oz)    Exam:   General: Easily arousable, alert, awake, oriented to self, able to follow commands  Cardiovascular: S1-S2 present, no added heart sounds  Respiratory: Mild crackles at the bases  Abdomen: Soft, nontender, bowel sounds present, large midline scar noted  Musculoskeletal: No pedal edema bilaterally  Skin: Normal  Psychiatry: Normal mood  Neuro: Moves all limbs, withdraws from pain   Data Reviewed: CBC: Recent Labs  Lab 01/20/17 2252  01/22/17 0304 01/23/17 0607 01/24/17 0245 01/24/17 2122 01/25/17 0306  WBC 15.5*   < > 16.0* 15.2* 12.9* 13.3* 11.0*  NEUTROABS 13.5*  --  13.8* 12.6* 9.6*   --  8.0*  HGB 11.8*   < > 12.2* 12.3* 11.2* 10.6* 11.2*  HCT 35.0*   < > 37.0* 38.2* 34.2* 32.9* 33.8*  MCV 104.2*   < > 104.2* 107.0* 107.2* 105.4* 105.0*  PLT 279   < > 246 233 223 265 243   < > = values in this interval not displayed.   Basic Metabolic Panel: Recent Labs  Lab 01/20/17 1815 01/20/17 2252 01/21/17 1013  01/22/17 1330  01/23/17 0607 01/23/17 0857 01/24/17 0245 01/24/17 0905 01/25/17 0306  NA 139  --  139   < > 139  --  139  --  138 137 135  K 6.8*  --  4.9   < > 5.5*   < > 5.0 4.8 4.9 4.9 4.3  CL 104  --  103   < > 102  --  97*  --  98* 96* 97*  CO2 18*  --  19*   < > 20*  --  20*  --  23 23 25   GLUCOSE 97  --  116*   < >  121*  --  107*  --  100* 97 140*  BUN 86*  --  63*   < > 93*  --  59*  --  78* 82* 48*  CREATININE 14.38*  --  11.13*   < > 14.01*  --  9.94*  --  11.98* 12.65* 8.85*  CALCIUM 9.9  --  9.4   < > 9.4  --  9.2  --  9.0 9.1 8.8*  MG 2.3  --   --   --   --   --   --   --   --   --   --   PHOS  --  6.8* 6.5*  --   --   --   --   --   --   --   --    < > = values in this interval not displayed.   GFR: Estimated Creatinine Clearance: 8.8 mL/min (A) (by C-G formula based on SCr of 8.85 mg/dL (H)). Liver Function Tests: Recent Labs  Lab 01/20/17 1815 01/21/17 1013 01/22/17 1147  AST 24  --  26  ALT 15*  --  14*  ALKPHOS 55  --  42  BILITOT 1.1  --  1.5*  PROT 7.5  --  6.9  ALBUMIN 4.1 3.7 3.6   No results for input(s): LIPASE, AMYLASE in the last 168 hours. Recent Labs  Lab 01/20/17 2252 01/24/17 0905  AMMONIA 34 52*   Coagulation Profile: No results for input(s): INR, PROTIME in the last 168 hours. Cardiac Enzymes: Recent Labs  Lab 01/20/17 2252  CKTOTAL 622*   BNP (last 3 results) No results for input(s): PROBNP in the last 8760 hours. HbA1C: No results for input(s): HGBA1C in the last 72 hours. CBG: Recent Labs  Lab 01/24/17 1209 01/24/17 1642 01/24/17 2015 01/25/17 0105 01/25/17 0506  GLUCAP 85 173* 125* 117* 121*    Lipid Profile: No results for input(s): CHOL, HDL, LDLCALC, TRIG, CHOLHDL, LDLDIRECT in the last 72 hours. Thyroid Function Tests: No results for input(s): TSH, T4TOTAL, FREET4, T3FREE, THYROIDAB in the last 72 hours. Anemia Panel: No results for input(s): VITAMINB12, FOLATE, FERRITIN, TIBC, IRON, RETICCTPCT in the last 72 hours. Urine analysis:    Component Value Date/Time   COLORURINE YELLOW 12/15/2016 1736   APPEARANCEUR CLEAR 12/15/2016 1736   LABSPEC 1.011 12/15/2016 1736   PHURINE 7.0 12/15/2016 1736   GLUCOSEU 50 (A) 12/15/2016 1736   HGBUR NEGATIVE 12/15/2016 1736   BILIRUBINUR NEGATIVE 12/15/2016 1736   KETONESUR NEGATIVE 12/15/2016 1736   PROTEINUR >=300 (A) 12/15/2016 1736   UROBILINOGEN 0.2 09/08/2007 1345   NITRITE NEGATIVE 12/15/2016 1736   LEUKOCYTESUR NEGATIVE 12/15/2016 1736   Sepsis Labs: @LABRCNTIP (procalcitonin:4,lacticidven:4)  ) Recent Results (from the past 240 hour(s))  Culture, blood (x 2)     Status: None (Preliminary result)   Collection Time: 01/21/17  3:25 AM  Result Value Ref Range Status   Specimen Description BLOOD LEFT WRIST  Final   Special Requests   Final    BOTTLES DRAWN AEROBIC AND ANAEROBIC Blood Culture adequate volume   Culture NO GROWTH 3 DAYS  Final   Report Status PENDING  Incomplete  Culture, blood (x 2)     Status: None (Preliminary result)   Collection Time: 01/21/17 10:10 AM  Result Value Ref Range Status   Specimen Description BLOOD LEFT ARM  Final   Special Requests   Final    BOTTLES DRAWN AEROBIC AND ANAEROBIC Blood Culture results may not  be optimal due to an excessive volume of blood received in culture bottles   Culture NO GROWTH 3 DAYS  Final   Report Status PENDING  Incomplete  MRSA PCR Screening     Status: None   Collection Time: 01/21/17  4:24 PM  Result Value Ref Range Status   MRSA by PCR NEGATIVE NEGATIVE Final    Comment:        The GeneXpert MRSA Assay (FDA approved for NASAL specimens only), is one  component of a comprehensive MRSA colonization surveillance program. It is not intended to diagnose MRSA infection nor to guide or monitor treatment for MRSA infections.       Studies: No results found.  Scheduled Meds: . alteplase  4 mg Intracatheter Once  . calcitRIOL  2 mcg Oral Q M,W,F  . calcium gluconate  1 g Intravenous Once  . chlorproMAZINE (THORAZINE) injection  25 mg Intramuscular Once  . cinacalcet  120 mg Oral Once per day on Mon Wed Fri  . dextrose  1 ampule Intravenous Once  . heparin  5,000 Units Subcutaneous Q8H  . insulin aspart  0-15 Units Subcutaneous Q4H  . insulin aspart  10 Units Intravenous Once  . pantoprazole (PROTONIX) IV  40 mg Intravenous QHS    Continuous Infusions: . piperacillin-tazobactam (ZOSYN)  IV Stopped (01/25/17 1006)     LOS: 5 days     Alma Friendly, MD [facility]   If 7PM-7AM, please contact night-coverage [email or web address] Password [email or web address] 01/25/2017, 10:56 AM

## 2017-01-25 NOTE — Progress Notes (Signed)
          Daily Rounding Note  01/25/2017, 1:08 PM  LOS: 5 days   SUBJECTIVE:   Pain in left ankle.   Tolerated dysphagia 3 diet.  Tells me he ate roast beef at lunch.  Not having troouble with swallowing today.  Is still hiccuping      OBJECTIVE:         Vital signs in last 24 hours:    Temp:  [98.9 F (37.2 C)-99.6 F (37.6 C)] 99.5 F (37.5 C) (12/17 0503) Pulse Rate:  [76-87] 84 (12/17 0503) Resp:  [14-21] 16 (12/17 0503) BP: (134-156)/(71-80) 150/80 (12/17 0503) SpO2:  [91 %-95 %] 91 % (12/17 0503) Weight:  [91.9 kg (202 lb 9.6 oz)] 91.9 kg (202 lb 9.6 oz) (12/16 2245) Last BM Date: 01/20/17 Filed Weights   01/22/17 1700 01/24/17 2010 01/24/17 2245  Weight: 88.9 kg (195 lb 15.8 oz) 91.9 kg (202 lb 9.6 oz) 91.9 kg (202 lb 9.6 oz)   General: obese, comfortable, hiccuping.    Heart: RRR Chest: non-labored breathing.   Abdomen: soft, obese, active BS,NT.    Extremities:  Neuro/Psych:  Cast on Left LE.    ASSESMENT:   *  Dysphagia.  Hiccups.  On daily Protonix.   Reeval today by SLP: "Pt continues with frequent hiccups. Observed with thin liquid and regular texture without cough but increased risk of aspiration due to poor coordination of respiration/swallow with hiccups. Educated/discussed advancement of texture and agreed to trial Dys 3 texture when sitting upright, small sips, pills whole in puree"       *  ESRD.  MWF HD sessions.    *  Found down at home.  Fractured distal , left fibula.  S/p casting.   *  Confusion.  TME.  Uremia vs infection vs medication.  Blood cultures negative.  On empiric zosyn.      PLAN   *  Diet guidelines per SLP  *  Dr Carlean Purl assuming GI service today.  At present, no plans for endoscopy.  Continue daily Protonix.      Azucena Freed  01/25/2017, 1:08 PM Pager: [phone]  Dsyphagia much better.  No role for GI endoscopic evaluation in my opinion.  Signing off.  Gatha Mayer, MD, [facility] [phone] (pager) 01/25/2017 6:08 PM

## 2017-01-26 LAB — CULTURE, BLOOD (ROUTINE X 2)
CULTURE: NO GROWTH
Culture: NO GROWTH
Special Requests: ADEQUATE

## 2017-01-26 LAB — CBC WITH DIFFERENTIAL/PLATELET
BASOS ABS: 0 10*3/uL (ref 0.0–0.1)
BASOS PCT: 0 %
Eosinophils Absolute: 0.2 10*3/uL (ref 0.0–0.7)
Eosinophils Relative: 2 %
HEMATOCRIT: 33.2 % — AB (ref 39.0–52.0)
Hemoglobin: 10.8 g/dL — ABNORMAL LOW (ref 13.0–17.0)
Lymphocytes Relative: 17 %
Lymphs Abs: 1.5 10*3/uL (ref 0.7–4.0)
MCH: 34.1 pg — ABNORMAL HIGH (ref 26.0–34.0)
MCHC: 32.5 g/dL (ref 30.0–36.0)
MCV: 104.7 fL — ABNORMAL HIGH (ref 78.0–100.0)
MONO ABS: 0.7 10*3/uL (ref 0.1–1.0)
Monocytes Relative: 8 %
NEUTROS ABS: 6.6 10*3/uL (ref 1.7–7.7)
Neutrophils Relative %: 73 %
Platelets: 225 10*3/uL (ref 150–400)
RBC: 3.17 MIL/uL — ABNORMAL LOW (ref 4.22–5.81)
RDW: 14.2 % (ref 11.5–15.5)
WBC: 9 10*3/uL (ref 4.0–10.5)

## 2017-01-26 LAB — COMPREHENSIVE METABOLIC PANEL
ALK PHOS: 34 U/L — AB (ref 38–126)
ALT: 46 U/L (ref 17–63)
AST: 37 U/L (ref 15–41)
Albumin: 3.2 g/dL — ABNORMAL LOW (ref 3.5–5.0)
Anion gap: 10 (ref 5–15)
BILIRUBIN TOTAL: 0.5 mg/dL (ref 0.3–1.2)
BUN: 35 mg/dL — AB (ref 6–20)
CALCIUM: 8.6 mg/dL — AB (ref 8.9–10.3)
CO2: 27 mmol/L (ref 22–32)
CREATININE: 8.43 mg/dL — AB (ref 0.61–1.24)
Chloride: 96 mmol/L — ABNORMAL LOW (ref 101–111)
GFR, EST AFRICAN AMERICAN: 7 mL/min — AB (ref >60)
GFR, EST NON AFRICAN AMERICAN: 6 mL/min — AB (ref >60)
Glucose, Bld: 129 mg/dL — ABNORMAL HIGH (ref 65–99)
Potassium: 3.9 mmol/L (ref 3.5–5.1)
Sodium: 133 mmol/L — ABNORMAL LOW (ref 135–145)
Total Protein: 6 g/dL — ABNORMAL LOW (ref 6.5–8.1)

## 2017-01-26 LAB — BASIC METABOLIC PANEL
ANION GAP: 10 (ref 5–15)
BUN: 25 mg/dL — ABNORMAL HIGH (ref 6–20)
CALCIUM: 8.4 mg/dL — AB (ref 8.9–10.3)
CO2: 28 mmol/L (ref 22–32)
Chloride: 96 mmol/L — ABNORMAL LOW (ref 101–111)
Creatinine, Ser: 6.43 mg/dL — ABNORMAL HIGH (ref 0.61–1.24)
GFR, EST AFRICAN AMERICAN: 9 mL/min — AB (ref >60)
GFR, EST NON AFRICAN AMERICAN: 8 mL/min — AB (ref >60)
Glucose, Bld: 133 mg/dL — ABNORMAL HIGH (ref 65–99)
Potassium: 3.9 mmol/L (ref 3.5–5.1)
SODIUM: 134 mmol/L — AB (ref 135–145)

## 2017-01-26 LAB — GLUCOSE, CAPILLARY
GLUCOSE-CAPILLARY: 119 mg/dL — AB (ref 65–99)
Glucose-Capillary: 123 mg/dL — ABNORMAL HIGH (ref 65–99)
Glucose-Capillary: 119 mg/dL — ABNORMAL HIGH (ref 65–99)
Glucose-Capillary: 145 mg/dL — ABNORMAL HIGH (ref 65–99)

## 2017-01-26 LAB — PHOSPHORUS: PHOSPHORUS: 6 mg/dL — AB (ref 2.5–4.6)

## 2017-01-26 MED ORDER — CHLORPROMAZINE HCL 25 MG/ML IJ SOLN
25.0000 mg | Freq: Once | INTRAMUSCULAR | Status: AC
  Administered 2017-01-26: 25 mg via INTRAMUSCULAR
  Filled 2017-01-26: qty 1

## 2017-01-26 NOTE — Care Management Note (Signed)
Case Management Note Marvetta Gibbons RN, BSN Unit 4E-Case Manager [phone]  Patient Details  Name: [name] MRN: [id number] Date of Birth: 1950/12/23  Subjective/Objective:     Pt found down at home admitted with hyperkalemia- hx HD- missed tx day               Action/Plan: PTA pt lived at home alone- recent fall with fx to fibula and cast placed- pt may benefit from PT eval for recommendations on transition needs for recovery plan. CM to follow.   Expected Discharge Date:                  Expected Discharge Plan:  Home/Self Care  In-House Referral:     Discharge planning Services  CM Consult  Post Acute Care Choice:  NA Choice offered to:     DME Arranged:    DME Agency:     HH Arranged:    HH Agency:     Status of Service:  In process, will continue to follow  If discussed at Long Length of Stay Meetings, dates discussed:    Discharge Disposition:   Additional Comments:  Dawayne Patricia, RN 01/26/2017, 11:20 AM

## 2017-01-26 NOTE — Progress Notes (Signed)
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), HTN, DM, HLD;who presents after being found downat his homewhen he did not show up for his routine scheduled dialysis  He is currently seen by GI and speech for esophageal dysphagia and was prescribed broad spectrum empiric antibiotics IV Vanco and IV zosyn  Confused  And difficult to rouse  MWF [facility] 4h 48min 2/2.5 bath 95.5kg R IJ cath/ R BC AVF (excision / repair of some scarred areas 11/05/16 Dr Donzetta Matters / on 11/19 Dr Scot Dock percut venoplasty )    Objective:  Vital signs in last 24 hours:  Temp:  [98.6 F (37 C)-100 F (37.8 C)] 99.4 F (37.4 C) (12/18 0450) Pulse Rate:  [76-95] 89 (12/18 0450) Resp:  [11-21] 21 (12/18 0450) BP: (136-177)/(64-88) 140/67 (12/18 0450) SpO2:  [93 %-96 %] 96 % (12/18 0450) Weight:  [199 lb 4.7 oz (90.4 kg)-199 lb 8.3 oz (90.5 kg)] 199 lb 8.3 oz (90.5 kg) (12/18 0450)  Weight change: -4.9 oz (-1.5 kg) Filed Weights   01/25/17 1303 01/25/17 1705 01/26/17 0450  Weight: 199 lb 4.7 oz (90.4 kg) 199 lb 4.7 oz (90.4 kg) 199 lb 8.3 oz (90.5 kg)    Intake/Output: I/O last 3 completed shifts: In: 150 [IV Piggyback:150] Out: 155 [Urine:200]   Intake/Output this shift:  No intake/output data recorded.  CVS- RRR RS- CTA ABD- BS present soft non-distended EXT- no edema   Basic Metabolic Panel: Recent Labs  Lab 01/20/17 1815 01/20/17 2252 01/21/17 1013  01/23/17 0607 01/23/17 0857 01/24/17 0245 01/24/17 0905 01/25/17 0306 01/26/17 0432  NA 139  --  139   < > 139  --  138 137 135 134*  K 6.8*  --  4.9   < > 5.0 4.8 4.9 4.9 4.3 3.9  CL 104  --  103   < > 97*  --  98* 96* 97* 96*  CO2 18*  --  19*   < > 20*  --  23 23 25 28   GLUCOSE 97  --  116*   < > 107*  --  100* 97 140* 133*  BUN 86*  --  63*   < > 59*  --  78* 82* 48* 25*  CREATININE 14.38*  --  11.13*   < > 9.94*  --  11.98* 12.65* 8.85* 6.43*  CALCIUM 9.9  --  9.4   < > 9.2  --  9.0  9.1 8.8* 8.4*  MG 2.3  --   --   --   --   --   --   --   --   --   PHOS  --  6.8* 6.5*  --   --   --   --   --   --  6.0*   < > = values in this interval not displayed.    Liver Function Tests: Recent Labs  Lab 01/20/17 1815 01/21/17 1013 01/22/17 1147  AST 24  --  26  ALT 15*  --  14*  ALKPHOS 55  --  42  BILITOT 1.1  --  1.5*  PROT 7.5  --  6.9  ALBUMIN 4.1 3.7 3.6   No results for input(s): LIPASE, AMYLASE in the last 168 hours. Recent Labs  Lab 01/20/17 2252 01/24/17 0905  AMMONIA 34 52*    CBC: Recent Labs  Lab 01/22/17 0304 01/23/17 0607 01/24/17 0245 01/24/17 2122 01/25/17 0306 01/26/17 0432  WBC 16.0*  15.2* 12.9* 13.3* 11.0* 9.0  NEUTROABS 13.8* 12.6* 9.6*  --  8.0* 6.6  HGB 12.2* 12.3* 11.2* 10.6* 11.2* 10.8*  HCT 37.0* 38.2* 34.2* 32.9* 33.8* 33.2*  MCV 104.2* 107.0* 107.2* 105.4* 105.0* 104.7*  PLT 246 233 223 265 243 225    Cardiac Enzymes: Recent Labs  Lab 01/20/17 2252  CKTOTAL 622*    BNP: Invalid input(s): POCBNP  CBG: Recent Labs  Lab 01/25/17 0105 01/25/17 0506 01/25/17 1134 01/25/17 2058 01/26/17 0607  GLUCAP 117* 121* 157* 32 119*    Microbiology: Results for orders placed or performed during the hospital encounter of 01/20/17  Culture, blood (x 2)     Status: None (Preliminary result)   Collection Time: 01/21/17  3:25 AM  Result Value Ref Range Status   Specimen Description BLOOD LEFT WRIST  Final   Special Requests   Final    BOTTLES DRAWN AEROBIC AND ANAEROBIC Blood Culture adequate volume   Culture NO GROWTH 4 DAYS  Final   Report Status PENDING  Incomplete  Culture, blood (x 2)     Status: None (Preliminary result)   Collection Time: 01/21/17 10:10 AM  Result Value Ref Range Status   Specimen Description BLOOD LEFT ARM  Final   Special Requests   Final    BOTTLES DRAWN AEROBIC AND ANAEROBIC Blood Culture results may not be optimal due to an excessive volume of blood received in culture bottles   Culture NO  GROWTH 4 DAYS  Final   Report Status PENDING  Incomplete  MRSA PCR Screening     Status: None   Collection Time: 01/21/17  4:24 PM  Result Value Ref Range Status   MRSA by PCR NEGATIVE NEGATIVE Final    Comment:        The GeneXpert MRSA Assay (FDA approved for NASAL specimens only), is one component of a comprehensive MRSA colonization surveillance program. It is not intended to diagnose MRSA infection nor to guide or monitor treatment for MRSA infections.     Coagulation Studies: No results for input(s): LABPROT, INR in the last 72 hours.  Urinalysis: No results for input(s): COLORURINE, LABSPEC, PHURINE, GLUCOSEU, HGBUR, BILIRUBINUR, KETONESUR, PROTEINUR, UROBILINOGEN, NITRITE, LEUKOCYTESUR in the last 72 hours.  Invalid input(s): APPERANCEUR    Imaging: No results found.   Medications:   . piperacillin-tazobactam (ZOSYN)  IV 3.375 g (01/26/17 1034)   . alteplase  4 mg Intracatheter Once  . amLODipine  10 mg Oral Daily  . atorvastatin  20 mg Oral q1800  . calcitRIOL  2 mcg Oral Q M,W,F  . calcium acetate  1,334 mg Oral TID WC  . cinacalcet  120 mg Oral Once per day on Mon Wed Fri  . folic acid  1 mg Oral Daily  . heparin  5,000 Units Subcutaneous Q8H  . insulin aspart  0-15 Units Subcutaneous TID AC & HS  . pantoprazole  40 mg Oral Daily  . tamsulosin  0.4 mg Oral QPC supper   acetaminophen **OR** acetaminophen, hydrALAZINE, ipratropium-albuterol, ondansetron **OR** ondansetron (ZOFRAN) IV  Assessment/ Plan:  1. Altered mental status - delirium , not really clearing with dialysis.IV antibiotics  He has been seen by neurology,who suspect that uremia is contributing to altered mental status 2. ESRD HD MWF 3. MBD of CKD - cont meds when able to swallow  4. Anemia of CKD - Hb at goal 5. DM per primary 6. COPD 7. Volume - CXR clear , no vol on exam, under dry wt 8.  Dysphagia  Speech pathology consulted       LOS: 6 Jennfer Gassen W @TODAY @11 :40 AM

## 2017-01-26 NOTE — Progress Notes (Signed)
PROGRESS NOTE    [name]  LOV:[id number] DOB: [birth year] DOA: 01/20/2017 PCP: [facility] [place]   Specialists:     Brief Narrative:  66 year old male ESRD on HD MWF HTN DM TY 2 HLD Apparently suffered a fall 12/11 fractured distal and left fibula sent home on pain medication follow-up with orthopedics Daughter received a call 12/12 as patient acutely altered missed dialysis-came to emergency room-workup revealed K6.8 BUN/creatinine 86/14 anion gap 17 CXR negative CT head negative EKG T wave peak Neurology consult and nephrology consult  Assessment & Plan:   Principal Problem:   Hyperkalemia Active Problems:   Diabetes mellitus ([place])   End stage renal disease on dialysis ([place])   SIRS (systemic inflammatory response syndrome) ([place])   Hypertensive urgency   Closed fracture of distal end of left fibula   Anemia due to chronic kidney disease   Acute metabolic encephalopathy   Abnormal swallowing   Altered mental status   Toxic metabolic encephalopathy DDX uremia, infection, medication In addition to above, MRI moderate small vessel disease, blood cultures negative pro-calcitonin minimally elevated, ammonia negative-EEG generalized irregular Developed again metabolic encephalopathy 88/41?  Secondary to morphine versus metabolic derangement-CT at that time negative head Speech therapy consulted recommending mechanical soft thin liquids--has overall improved  ?  Sepsis No source, all cultures negative on empiric Zosyn treating for 7 days since stopping, end date 12/20-white count trend 15-->9.0 It appears vancomycin has been discontinued 12/16  Hiccups Trial of Thorazine 25 IM x 1 If better start prn orally  Dysphagia Speech is seen and recommended dysphagia 3 diet secondary to poor coordination respirations swallow with hiccups  ESRD MWF Hyperkalemia-resolved with treatment calcium gluconate dextrose etc. currently 3.9 Anemia of renal disease Secondary  hyperparathyroidism Defer to nephrology Continue calcitriol 2 mcg MWF, Sensipar 120  Hypertensive urgency on admission 208/97 now better lisinopril has been held Continue amlodipine 10, hydralazine 10 every 4 as needed blood pressure >180  Closed fracture distal end of left fibula secondary to fall Continue Ace wrap splint and needs follow-up with Dr. Percell Miller of orthopedics as an outpatient  Hyperlipidemia Continue Lipitor 20     Consultants:   Nephrology  Procedures:   None  Antimicrobials:   Zosyn   Subjective:  Awake alert able to tell me place time year No sob nor cp Daughter however thinks still confused   Objective: Vitals:   01/25/17 1718 01/25/17 2010 01/26/17 0013 01/26/17 0450  BP: (!) 160/73 (!) 156/81 (!) 156/83 140/67  Pulse: 85 91 95 89  Resp: 18 18 (!) 21 (!) 21  Temp: 99.2 F (37.3 C) 100 F (37.8 C) 99.4 F (37.4 C) 99.4 F (37.4 C)  TempSrc: Oral Oral Oral Oral  SpO2: 94% 93%  96%  Weight:    90.5 kg (199 lb 8.3 oz)  Height:        Intake/Output Summary (Last 24 hours) at 01/26/2017 0742 Last data filed at 01/26/2017 0445 Gross per 24 hour  Intake 50 ml  Output 155 ml  Net -105 ml   Filed Weights   01/25/17 1303 01/25/17 1705 01/26/17 0450  Weight: 90.4 kg (199 lb 4.7 oz) 90.4 kg (199 lb 4.7 oz) 90.5 kg (199 lb 8.3 oz)    Examination:  eomi ncat Perrla cta b no added sound abd soft nt nd no rebound No lan s1 s 2no m/r/g No le edema LLE wrapped  Data Reviewed: I have personally reviewed following labs and imaging studies  CBC: Recent  Labs  Lab 01/22/17 0304 01/23/17 8937 01/24/17 0245 01/24/17 2122 01/25/17 0306 01/26/17 0432  WBC 16.0* 15.2* 12.9* 13.3* 11.0* 9.0  NEUTROABS 13.8* 12.6* 9.6*  --  8.0* 6.6  HGB 12.2* 12.3* 11.2* 10.6* 11.2* 10.8*  HCT 37.0* 38.2* 34.2* 32.9* 33.8* 33.2*  MCV 104.2* 107.0* 107.2* 105.4* 105.0* 104.7*  PLT 246 233 223 265 243 342   Basic Metabolic Panel: Recent Labs  Lab  01/20/17 1815 01/20/17 2252 01/21/17 1013  01/23/17 0607 01/23/17 0857 01/24/17 0245 01/24/17 0905 01/25/17 0306 01/26/17 0432  NA 139  --  139   < > 139  --  138 137 135 134*  K 6.8*  --  4.9   < > 5.0 4.8 4.9 4.9 4.3 3.9  CL 104  --  103   < > 97*  --  98* 96* 97* 96*  CO2 18*  --  19*   < > 20*  --  23 23 25 28   GLUCOSE 97  --  116*   < > 107*  --  100* 97 140* 133*  BUN 86*  --  63*   < > 59*  --  78* 82* 48* 25*  CREATININE 14.38*  --  11.13*   < > 9.94*  --  11.98* 12.65* 8.85* 6.43*  CALCIUM 9.9  --  9.4   < > 9.2  --  9.0 9.1 8.8* 8.4*  MG 2.3  --   --   --   --   --   --   --   --   --   PHOS  --  6.8* 6.5*  --   --   --   --   --   --  6.0*   < > = values in this interval not displayed.   GFR: Estimated Creatinine Clearance: 12 mL/min (A) (by C-G formula based on SCr of 6.43 mg/dL (H)). Liver Function Tests: Recent Labs  Lab 01/20/17 1815 01/21/17 1013 01/22/17 1147  AST 24  --  26  ALT 15*  --  14*  ALKPHOS 55  --  42  BILITOT 1.1  --  1.5*  PROT 7.5  --  6.9  ALBUMIN 4.1 3.7 3.6   No results for input(s): LIPASE, AMYLASE in the last 168 hours. Recent Labs  Lab 01/20/17 2252 01/24/17 0905  AMMONIA 34 52*   Coagulation Profile: No results for input(s): INR, PROTIME in the last 168 hours. Cardiac Enzymes: Recent Labs  Lab 01/20/17 2252  CKTOTAL 622*   BNP (last 3 results) No results for input(s): PROBNP in the last 8760 hours. HbA1C: No results for input(s): HGBA1C in the last 72 hours. CBG: Recent Labs  Lab 01/25/17 0105 01/25/17 0506 01/25/17 1134 01/25/17 2058 01/26/17 0607  GLUCAP 117* 121* 157* 98 119*   Lipid Profile: No results for input(s): CHOL, HDL, LDLCALC, TRIG, CHOLHDL, LDLDIRECT in the last 72 hours. Thyroid Function Tests: No results for input(s): TSH, T4TOTAL, FREET4, T3FREE, THYROIDAB in the last 72 hours. Anemia Panel: No results for input(s): VITAMINB12, FOLATE, FERRITIN, TIBC, IRON, RETICCTPCT in the last 72  hours. Urine analysis:    Component Value Date/Time   COLORURINE YELLOW 12/15/2016 1736   APPEARANCEUR CLEAR 12/15/2016 1736   LABSPEC 1.011 12/15/2016 1736   PHURINE 7.0 12/15/2016 1736   GLUCOSEU 50 (A) 12/15/2016 1736   HGBUR NEGATIVE 12/15/2016 1736   BILIRUBINUR NEGATIVE 12/15/2016 1736   KETONESUR NEGATIVE 12/15/2016 1736   PROTEINUR >=300 (A) 12/15/2016  1736   UROBILINOGEN 0.2 09/08/2007 1345   NITRITE NEGATIVE 12/15/2016 1736   LEUKOCYTESUR NEGATIVE 12/15/2016 1736     Radiology Studies: Reviewed images personally in health database    Scheduled Meds: . alteplase  4 mg Intracatheter Once  . amLODipine  10 mg Oral Daily  . atorvastatin  20 mg Oral q1800  . calcitRIOL  2 mcg Oral Q M,W,F  . calcium acetate  1,334 mg Oral TID WC  . cinacalcet  120 mg Oral Once per day on Mon Wed Fri  . folic acid  1 mg Oral Daily  . heparin  5,000 Units Subcutaneous Q8H  . insulin aspart  0-15 Units Subcutaneous TID AC & HS  . pantoprazole  40 mg Oral Daily  . tamsulosin  0.4 mg Oral QPC supper   Continuous Infusions: . piperacillin-tazobactam (ZOSYN)  IV Stopped (01/26/17 0142)     LOS: 6 days    Time spent: [place], MD [facility] ([facility]   If 7PM-7AM, please contact night-coverage [email or web address] Password [facility] 01/26/2017, 7:42 AM

## 2017-01-26 NOTE — Progress Notes (Signed)
Went to room due to patient was off Tele . Was unable to wake patient up skin warm and dry . Patient having some sleep apnea with sats down 87% on rm Air. Applied o2 at 2 l/m [place] [facility] Patient would response to pain by moving limbs away from you. Could not get him to follow commands or talk. He keep eyes close. Pupils small with some reaction. Rapid Response call and K.Kirby N.P. Call . K.Kirby N.P. On her way to see patient .After about 45 mins. of this he woke up and could answer most questions right and move all extrem. And follow commands. Con.t  to monitor patient.

## 2017-01-26 NOTE — Progress Notes (Signed)
Shift event: LPN paged earlier in shift because pt was very somnolent and she was having difficulty awakening him. Threasa Beards states he is having small apneic spells.  Melanie LPN stated he had not had anything narcotic or sedative since Thorazine was given around 3pm. Sat dropped once but pt pulled off O2. NP to bedside.  S: Pt can not participate in ROS due to mental status. Per report, Threasa Beards states day RN stated pt was alert and oriented. Pt has not had HD today.  O: Fairly well, chronically ill appearing elderly AAM in NAD. Opens eyes partially to sternal rub and to pain. At times, sits up partically to pain. He falls right back to sleep. Snoring. O2 sat normal. RR normal. No increased WOB. BP 130s. HR non tachy, some PVCs noted on tele. MOE x 4 spontaneously. After awake more, strength 5/5, follows commands. Pupils 70mm and sluggish, equal, round. Grips equal. Speech fluent, non aphasic and clear. No face asymmetry.  A/P: 1. Lethargy-? Sleep apnea.  Stayed with pt for awhile. Had ordered stat ABG to r/o hypercarbia and ammonia/CMP. When RT arrived to do ABG, pt awakened and was oriented. Said he was "just sleepy". Says he sleeps hard at home. Says he has been told in the past that he stops breathing during sleep. Is able to tell [name] where he is, his name, birthday, month, and that Christmas is next week.  NP canceled ABG and ammonia. Get CMP. Watch pt closely. He may need an output sleep study. Doubt he will keep mask on tonight because he was pulling his [place] out of his nose. Plus, he is satting well now and awake. Will watch closely tonight. Perhaps, try some CPap.  Follow labs. LPN to call if happens again.  Apparently, pt had an episode yesterday similar to this and neuro was called in. Stroke was r/o by CT and MRI.   Total critical care time: 45 minutes Critical care time was exclusive of separately billable procedures and treating other patients. Critical care was necessary to treat or prevent  imminent or life-threatening deterioration. Critical care was time spent personally by me on the following activities: development of treatment plan with patient and/or surrogate as well as nursing, discussions with consultants, evaluation of patient's response to treatment, examination of patient, obtaining history from patient or surrogate, ordering and performing treatments and interventions, ordering and review of laboratory studies, ordering and review of radiographic studies, pulse oximetry and re-evaluation of patient's condition. KJKG, NP [facility]

## 2017-01-27 LAB — CBC WITH DIFFERENTIAL/PLATELET
BASOS PCT: 0 %
Basophils Absolute: 0 10*3/uL (ref 0.0–0.1)
Eosinophils Absolute: 0.3 10*3/uL (ref 0.0–0.7)
Eosinophils Relative: 4 %
HEMATOCRIT: 29.7 % — AB (ref 39.0–52.0)
HEMOGLOBIN: 10.1 g/dL — AB (ref 13.0–17.0)
LYMPHS PCT: 26 %
Lymphs Abs: 2.2 10*3/uL (ref 0.7–4.0)
MCH: 35.1 pg — ABNORMAL HIGH (ref 26.0–34.0)
MCHC: 34 g/dL (ref 30.0–36.0)
MCV: 103.1 fL — AB (ref 78.0–100.0)
MONO ABS: 0.6 10*3/uL (ref 0.1–1.0)
MONOS PCT: 7 %
NEUTROS ABS: 5.4 10*3/uL (ref 1.7–7.7)
NEUTROS PCT: 63 %
Platelets: 227 10*3/uL (ref 150–400)
RBC: 2.88 MIL/uL — ABNORMAL LOW (ref 4.22–5.81)
RDW: 13.8 % (ref 11.5–15.5)
WBC: 8.5 10*3/uL (ref 4.0–10.5)

## 2017-01-27 LAB — GLUCOSE, CAPILLARY
GLUCOSE-CAPILLARY: 160 mg/dL — AB (ref 65–99)
GLUCOSE-CAPILLARY: 216 mg/dL — AB (ref 65–99)
GLUCOSE-CAPILLARY: 107 mg/dL — AB (ref 65–99)
Glucose-Capillary: 96 mg/dL (ref 65–99)

## 2017-01-27 LAB — RENAL FUNCTION PANEL
Albumin: 2.9 g/dL — ABNORMAL LOW (ref 3.5–5.0)
Anion gap: 12 (ref 5–15)
BUN: 38 mg/dL — ABNORMAL HIGH (ref 6–20)
CO2: 26 mmol/L (ref 22–32)
Calcium: 8.3 mg/dL — ABNORMAL LOW (ref 8.9–10.3)
Chloride: 94 mmol/L — ABNORMAL LOW (ref 101–111)
Creatinine, Ser: 9.25 mg/dL — ABNORMAL HIGH (ref 0.61–1.24)
GFR calc Af Amer: 6 mL/min — ABNORMAL LOW (ref >60)
GFR calc non Af Amer: 5 mL/min — ABNORMAL LOW (ref >60)
Glucose, Bld: 187 mg/dL — ABNORMAL HIGH (ref 65–99)
Phosphorus: 7.4 mg/dL — ABNORMAL HIGH (ref 2.5–4.6)
Potassium: 3.8 mmol/L (ref 3.5–5.1)
Sodium: 132 mmol/L — ABNORMAL LOW (ref 135–145)

## 2017-01-27 MED ORDER — LIDOCAINE-PRILOCAINE 2.5-2.5 % EX CREA: 1.0000 "application " | TOPICAL_CREAM | CUTANEOUS | Status: DC | PRN

## 2017-01-27 MED ORDER — SODIUM CHLORIDE 0.9 % IV SOLN: 100.0000 mL | INTRAVENOUS | Status: DC | PRN

## 2017-01-27 MED ORDER — ALTEPLASE 2 MG IJ SOLR: 2.0000 mg | Freq: Once | INTRAMUSCULAR | Status: DC | PRN

## 2017-01-27 MED ORDER — LIDOCAINE HCL (PF) 1 % IJ SOLN: 5.0000 mL | INTRAMUSCULAR | Status: DC | PRN

## 2017-01-27 MED ORDER — HEPARIN SODIUM (PORCINE) 1000 UNIT/ML DIALYSIS
1000.0000 [IU] | INTRAMUSCULAR | Status: DC | PRN
  Filled 2017-01-27: qty 1

## 2017-01-27 NOTE — Progress Notes (Signed)
Patient awake and talkative asking for coffee. He pulled himself up in the bed. MAE Not orin to date or place .

## 2017-01-27 NOTE — Progress Notes (Signed)
PT Cancellation Note  Patient Details Name: [name] MRN: [id number] DOB: 08-12-1950   Cancelled Treatment:    Reason Eval/Treat Not Completed: Patient at procedure or test/unavailable RN reports patient is OK to be seen by PT; Dr. Verlon Au has given verbal permission for patient to get OOB with jugular HD catheter. Attempted to evaluate patient however he was unavailable this morning/at procedure, will try to attempt later if time allows.    Deniece Ree PT, DPT, CBIS  Supplemental Physical Therapist [facility]

## 2017-01-27 NOTE — Progress Notes (Signed)
Melanie LPN called for pt more somnolent than normal. Reports sats dropped to 87% but had removed his [employer]. Pt able to MAE and follow commands but unable to keep him awake. Baltazar Najjar NP paged and to bedside prior to my arrival. Pt alert and oriented. No interventions from RRT

## 2017-01-27 NOTE — Progress Notes (Signed)
  Speech Language Pathology Treatment: Dysphagia  Patient Details Name: [name] MRN: [id number] DOB: Apr 29, 1950 Today's Date: 01/27/2017 Time: 8295-6213 SLP Time Calculation (min) (ACUTE ONLY): 8 min  Assessment / Plan / Recommendation Clinical Impression  Pt appears stronger; hiccups ceased. States no difficulty with food/liquid past 1-2 days. No s/s aspiration present and no cues needed with po consumption. Pt in agreement with texture upgrade to regular and discharge ST.   HPI HPI: [name] a 66 y.o.malewith medical history significant ofESRD on HD(MWF), HTN, DM, HLD, GERD;who presents after being found downat his homewhen he did not show up for his routine scheduled dialysis. Patient was recently seen in the emergency departmenton 12/11/18after suffering a fallfound to have a fracturetothe distal end of his left fibula.CT scan of the brain showed no acute intracranial findings. Admitted with acute toxic metabolic encephalopathy of unclear etiology, SIRS/sepsis.      SLP Plan  All goals met;Discharge SLP treatment due to (comment)       Recommendations  Diet recommendations: Regular;Thin liquid Liquids provided via: Cup;Straw Medication Administration: Whole meds with liquid Supervision: Patient able to self feed Compensations: Slow rate;Small sips/bites Postural Changes and/or Swallow Maneuvers: Seated upright 90 degrees;Upright 30-60 min after meal                Oral Care Recommendations: Oral care BID Follow up Recommendations: None SLP Visit Diagnosis: Dysphagia, unspecified (R13.10) Plan: All goals met;Discharge SLP treatment due to (comment)       GO                [name] 01/27/2017, 2:56 PM  [name].Ed [employer] [phone]

## 2017-01-27 NOTE — Progress Notes (Addendum)
PROGRESS NOTE    [name]  KTG:[id number] DOB: 31-Jul-1950 DOA: 01/20/2017 PCP: [facility] [place]   Specialists:     Brief Narrative:  66 year old male ESRD on HD MWF HTN DM TY 2 HLD Apparently suffered a fall 12/11 fractured distal and left fibula sent home on pain medication follow-up with orthopedics Daughter received a call 12/12 as patient acutely altered missed dialysis-came to emergency room-workup revealed K6.8 BUN/creatinine 86/14 anion gap 17 CXR negative CT head negative EKG T wave peak Neurology consult and nephrology consult  Assessment & Plan:   Principal Problem:   Hyperkalemia Active Problems:   Diabetes mellitus ([place])   End stage renal disease on dialysis ([place])   SIRS (systemic inflammatory response syndrome) ([place])   Hypertensive urgency   Closed fracture of distal end of left fibula   Anemia due to chronic kidney disease   Acute metabolic encephalopathy   Abnormal swallowing   Altered mental status   Toxic metabolic encephalopathy DDX uremia, infection, medication In addition to above, MRI moderate small vessel disease, blood cultures negative pro-calcitonin minimally elevated, ammonia negative-EEG generalized irregular Developed again metabolic encephalopathy 42/87?  Sleepiness 12/18 pm Now conpletely resolved Speech therapy consulted recommending mechanical soft thin liquids--has overall improved  ? OSA Snores at night and sleeps "Hard" Has episodic somnolence on 12/18 pm and was difficult to arouse Will need formal OSa testing split somnogram  ?  Sepsis No source, all cultures negative on empiric Zosyn treating for 7 days since stopping, end date 12/20-white count trend 15-->9.0 It appears vancomycin has been discontinued 12/16  Hiccups Trial of Thorazine 25 IM x 1 Resolved completely  Dysphagia Speech is seen and recommended dysphagia 3 diet secondary to poor coordination respirations swallow with hiccups  ESRD  MWF Hyperkalemia-resolved with treatment calcium gluconate dextrose etc. currently 3.9 Anemia of renal disease Secondary hyperparathyroidism Defer to nephrology Continue calcitriol 2 mcg MWF, Sensipar 120  Hypertensive urgency on admission 208/97 now better lisinopril has been held Continue amlodipine 10, hydralazine 10 every 4 as needed blood pressure >180  Closed fracture distal end of left fibula secondary to fall Continue Ace wrap splint and needs follow-up with Dr. Percell Miller of orthopedics as an outpatient nwb presumed and OP therapy  Hyperlipidemia Continue Lipitor 20     Consultants:   Nephrology  Procedures:   None  Antimicrobials:   Zosyn   Subjective:  Awake alert in nad  eating and drinking  no cp Therapy to see Overnight events noted  Objective: Vitals:   01/27/17 1130 01/27/17 1200 01/27/17 1230 01/27/17 1300  BP: (!) 143/84 (!) 146/89 (!) 157/79 (!) 142/76  Pulse: 100 81 83 88  Resp:      Temp:      TempSrc:      SpO2:      Weight:      Height:        Intake/Output Summary (Last 24 hours) at 01/27/2017 1344 Last data filed at 01/27/2017 6811 Gross per 24 hour  Intake 170 ml  Output -  Net 170 ml   Filed Weights   01/26/17 0450 01/27/17 0511 01/27/17 0933  Weight: 90.5 kg (199 lb 8.3 oz) 91 kg (200 lb 9.9 oz) 91.3 kg (201 lb 4.5 oz)    Examination:  eomi ncat no hiccups Perrla cta b abd soft nt nd no rebound No lan s1 s 2no m/r/g No le edema Moves 4 limbs equally without deficit LLE wrapped  Data Reviewed: I have personally reviewed  following labs and imaging studies  CBC: Recent Labs  Lab 01/23/17 0607 01/24/17 0245 01/24/17 2122 01/25/17 0306 01/26/17 0432 01/27/17 0237  WBC 15.2* 12.9* 13.3* 11.0* 9.0 8.5  NEUTROABS 12.6* 9.6*  --  8.0* 6.6 5.4  HGB 12.3* 11.2* 10.6* 11.2* 10.8* 10.1*  HCT 38.2* 34.2* 32.9* 33.8* 33.2* 29.7*  MCV 107.0* 107.2* 105.4* 105.0* 104.7* 103.1*  PLT 233 223 265 243 225 169   Basic  Metabolic Panel: Recent Labs  Lab 01/20/17 1815 01/20/17 2252 01/21/17 1013  01/24/17 0905 01/25/17 0306 01/26/17 0432 01/26/17 2150 01/27/17 0917  NA 139  --  139   < > 137 135 134* 133* 132*  K 6.8*  --  4.9   < > 4.9 4.3 3.9 3.9 3.8  CL 104  --  103   < > 96* 97* 96* 96* 94*  CO2 18*  --  19*   < > 23 25 28 27 26   GLUCOSE 97  --  116*   < > 97 140* 133* 129* 187*  BUN 86*  --  63*   < > 82* 48* 25* 35* 38*  CREATININE 14.38*  --  11.13*   < > 12.65* 8.85* 6.43* 8.43* 9.25*  CALCIUM 9.9  --  9.4   < > 9.1 8.8* 8.4* 8.6* 8.3*  MG 2.3  --   --   --   --   --   --   --   --   PHOS  --  6.8* 6.5*  --   --   --  6.0*  --  7.4*   < > = values in this interval not displayed.   GFR: Estimated Creatinine Clearance: 8.4 mL/min (A) (by C-G formula based on SCr of 9.25 mg/dL (H)). Liver Function Tests: Recent Labs  Lab 01/20/17 1815 01/21/17 1013 01/22/17 1147 01/26/17 2150 01/27/17 0917  AST 24  --  26 37  --   ALT 15*  --  14* 46  --   ALKPHOS 55  --  42 34*  --   BILITOT 1.1  --  1.5* 0.5  --   PROT 7.5  --  6.9 6.0*  --   ALBUMIN 4.1 3.7 3.6 3.2* 2.9*   No results for input(s): LIPASE, AMYLASE in the last 168 hours. Recent Labs  Lab 01/20/17 2252 01/24/17 0905  AMMONIA 34 52*   Coagulation Profile: No results for input(s): INR, PROTIME in the last 168 hours. Cardiac Enzymes: Recent Labs  Lab 01/20/17 2252  CKTOTAL 622*   BNP (last 3 results) No results for input(s): PROBNP in the last 8760 hours. HbA1C: No results for input(s): HGBA1C in the last 72 hours. CBG: Recent Labs  Lab 01/26/17 1142 01/26/17 1653 01/26/17 2022 01/27/17 0606 01/27/17 0822  GLUCAP 123* 119* 145* 96 160*   Lipid Profile: No results for input(s): CHOL, HDL, LDLCALC, TRIG, CHOLHDL, LDLDIRECT in the last 72 hours. Thyroid Function Tests: No results for input(s): TSH, T4TOTAL, FREET4, T3FREE, THYROIDAB in the last 72 hours. Anemia Panel: No results for input(s): VITAMINB12,  FOLATE, FERRITIN, TIBC, IRON, RETICCTPCT in the last 72 hours. Urine analysis:    Component Value Date/Time   COLORURINE YELLOW 12/15/2016 1736   APPEARANCEUR CLEAR 12/15/2016 1736   LABSPEC 1.011 12/15/2016 1736   PHURINE 7.0 12/15/2016 1736   GLUCOSEU 50 (A) 12/15/2016 1736   HGBUR NEGATIVE 12/15/2016 1736   BILIRUBINUR NEGATIVE 12/15/2016 1736   KETONESUR NEGATIVE 12/15/2016 1736   PROTEINUR >=300 (  A) 12/15/2016 1736   UROBILINOGEN 0.2 09/08/2007 1345   NITRITE NEGATIVE 12/15/2016 1736   LEUKOCYTESUR NEGATIVE 12/15/2016 1736     Radiology Studies: Reviewed images personally in health database    Scheduled Meds: . alteplase  4 mg Intracatheter Once  . amLODipine  10 mg Oral Daily  . atorvastatin  20 mg Oral q1800  . calcitRIOL  2 mcg Oral Q M,W,F  . calcium acetate  1,334 mg Oral TID WC  . cinacalcet  120 mg Oral Once per day on Mon Wed Fri  . folic acid  1 mg Oral Daily  . heparin  5,000 Units Subcutaneous Q8H  . insulin aspart  0-15 Units Subcutaneous TID AC & HS  . pantoprazole  40 mg Oral Daily  . tamsulosin  0.4 mg Oral QPC supper   Continuous Infusions: . sodium chloride    . sodium chloride    . piperacillin-tazobactam (ZOSYN)  IV 3.375 g (01/27/17 0806)     LOS: 7 days    Time spent: [place], MD [facility]   If 7PM-7AM, please contact night-coverage [email or web address] Password [email or web address] 01/27/2017, 1:44 PM

## 2017-01-27 NOTE — Progress Notes (Signed)
[facility] ROUNDING NOTE   Subjective:   Interval History:ESRD on HD(MWF), HTN, DM, HLD;who presents after being found downat his homewhen he did not show up for his routine scheduled dialysis He is currently seen by GI and speech for esophageal dysphagia and was prescribed broad spectrum empiric antibiotics IV Vanco and IV zosyn  Appears less confused and eating pancakes   MWF [facility] 4h 88min 2/2.5 bath 95.5kg R IJ cath/ R BC AVF (excision / repair of some scarred areas 11/05/16 Dr Donzetta Matters / on 11/19 Dr Scot Dock percut venoplasty )  Objective:  Vital signs in last 24 hours:  Temp:  [97.1 F (36.2 C)-99.2 F (37.3 C)] 97.1 F (36.2 C) (12/19 0823) Pulse Rate:  [81-90] 83 (12/19 0823) Resp:  [19-22] 20 (12/19 0823) BP: (137-169)/(67-89) 169/89 (12/19 0823) SpO2:  [94 %-100 %] 97 % (12/19 0823) Weight:  [200 lb 9.9 oz (91 kg)] 200 lb 9.9 oz (91 kg) (12/19 0511)  Weight change: 1 lb 5.2 oz (0.6 kg) Filed Weights   01/25/17 1705 01/26/17 0450 01/27/17 0511  Weight: 199 lb 4.7 oz (90.4 kg) 199 lb 8.3 oz (90.5 kg) 200 lb 9.9 oz (91 kg)    Intake/Output: I/O last 3 completed shifts: In: 220 [P.O.:120; IV Piggyback:100] Out: 200 [Urine:200]   Intake/Output this shift:  No intake/output data recorded.  CVS- RRR RS- CTA ABD- BS present soft non-distended EXT- no edema   Basic Metabolic Panel: Recent Labs  Lab 01/20/17 1815 01/20/17 2252 01/21/17 1013  01/24/17 0905 01/25/17 0306 01/26/17 0432 01/26/17 2150 01/27/17 0917  NA 139  --  139   < > 137 135 134* 133* 132*  K 6.8*  --  4.9   < > 4.9 4.3 3.9 3.9 3.8  CL 104  --  103   < > 96* 97* 96* 96* 94*  CO2 18*  --  19*   < > 23 25 28 27 26   GLUCOSE 97  --  116*   < > 97 140* 133* 129* 187*  BUN 86*  --  63*   < > 82* 48* 25* 35* 38*  CREATININE 14.38*  --  11.13*   < > 12.65* 8.85* 6.43* 8.43* 9.25*  CALCIUM 9.9  --  9.4   < > 9.1 8.8* 8.4* 8.6* 8.3*  MG 2.3  --   --   --   --   --   --    --   --   PHOS  --  6.8* 6.5*  --   --   --  6.0*  --  7.4*   < > = values in this interval not displayed.    Liver Function Tests: Recent Labs  Lab 01/20/17 1815 01/21/17 1013 01/22/17 1147 01/26/17 2150 01/27/17 0917  AST 24  --  26 37  --   ALT 15*  --  14* 46  --   ALKPHOS 55  --  42 34*  --   BILITOT 1.1  --  1.5* 0.5  --   PROT 7.5  --  6.9 6.0*  --   ALBUMIN 4.1 3.7 3.6 3.2* 2.9*   No results for input(s): LIPASE, AMYLASE in the last 168 hours. Recent Labs  Lab 01/20/17 2252 01/24/17 0905  AMMONIA 34 52*    CBC: Recent Labs  Lab 01/23/17 0607 01/24/17 0245 01/24/17 2122 01/25/17 0306 01/26/17 0432 01/27/17 0237  WBC 15.2* 12.9* 13.3* 11.0* 9.0 8.5  NEUTROABS 12.6* 9.6*  --  8.0* 6.6 5.4  HGB 12.3* 11.2* 10.6* 11.2* 10.8* 10.1*  HCT 38.2* 34.2* 32.9* 33.8* 33.2* 29.7*  MCV 107.0* 107.2* 105.4* 105.0* 104.7* 103.1*  PLT 233 223 265 243 225 227    Cardiac Enzymes: Recent Labs  Lab 01/20/17 2252  CKTOTAL 622*    BNP: Invalid input(s): POCBNP  CBG: Recent Labs  Lab 01/26/17 1142 01/26/17 1653 01/26/17 2022 01/27/17 0606 01/27/17 0822  GLUCAP 123* 119* 145* 52 160*    Microbiology: Results for orders placed or performed during the hospital encounter of 01/20/17  Culture, blood (x 2)     Status: None   Collection Time: 01/21/17  3:25 AM  Result Value Ref Range Status   Specimen Description BLOOD LEFT WRIST  Final   Special Requests   Final    BOTTLES DRAWN AEROBIC AND ANAEROBIC Blood Culture adequate volume   Culture NO GROWTH 5 DAYS  Final   Report Status 01/26/2017 FINAL  Final  Culture, blood (x 2)     Status: None   Collection Time: 01/21/17 10:10 AM  Result Value Ref Range Status   Specimen Description BLOOD LEFT ARM  Final   Special Requests   Final    BOTTLES DRAWN AEROBIC AND ANAEROBIC Blood Culture results may not be optimal due to an excessive volume of blood received in culture bottles   Culture NO GROWTH 5 DAYS  Final    Report Status 01/26/2017 FINAL  Final  MRSA PCR Screening     Status: None   Collection Time: 01/21/17  4:24 PM  Result Value Ref Range Status   MRSA by PCR NEGATIVE NEGATIVE Final    Comment:        The GeneXpert MRSA Assay (FDA approved for NASAL specimens only), is one component of a comprehensive MRSA colonization surveillance program. It is not intended to diagnose MRSA infection nor to guide or monitor treatment for MRSA infections.     Coagulation Studies: No results for input(s): LABPROT, INR in the last 72 hours.  Urinalysis: No results for input(s): COLORURINE, LABSPEC, PHURINE, GLUCOSEU, HGBUR, BILIRUBINUR, KETONESUR, PROTEINUR, UROBILINOGEN, NITRITE, LEUKOCYTESUR in the last 72 hours.  Invalid input(s): APPERANCEUR    Imaging: No results found.   Medications:   . sodium chloride    . sodium chloride    . piperacillin-tazobactam (ZOSYN)  IV 3.375 g (01/27/17 0806)   . alteplase  4 mg Intracatheter Once  . amLODipine  10 mg Oral Daily  . atorvastatin  20 mg Oral q1800  . calcitRIOL  2 mcg Oral Q M,W,F  . calcium acetate  1,334 mg Oral TID WC  . cinacalcet  120 mg Oral Once per day on Mon Wed Fri  . folic acid  1 mg Oral Daily  . heparin  5,000 Units Subcutaneous Q8H  . insulin aspart  0-15 Units Subcutaneous TID AC & HS  . pantoprazole  40 mg Oral Daily  . tamsulosin  0.4 mg Oral QPC supper   sodium chloride, sodium chloride, acetaminophen **OR** acetaminophen, alteplase, heparin, hydrALAZINE, ipratropium-albuterol, lidocaine (PF), lidocaine-prilocaine, ondansetron **OR** ondansetron (ZOFRAN) IV  Assessment/ Plan:  1. Altered mental status - delirium , not really clearing with dialysis.IV antibiotics He has been seen by neurology,who suspect that uremia is contributing to altered mental status 2. ESRD HD MWF 3. MBD of CKD - cont meds when able to swallow  4. Anemia of CKD - Hb at goal 5. DM per primary 6. COPD 7. Volume - CXR clear , no vol  on  exam, under dry wt 8. Dysphagia Speech pathology consulted    Planned dialysis today    LOS: 7 [place]a [place] W @TODAY @10 :26 AM

## 2017-01-27 NOTE — Evaluation (Signed)
Physical Therapy Evaluation Patient Details Name: [name] MRN: [id number] DOB: 14-Sep-1950 Today's Date: 01/27/2017   History of Present Illness  66yo male who was found on floor at home by daughter after missing dialysis, also recently admitted with L fibular fracture. Diagnosed with hyperkalemia, acute metaboilc encephalopathy, SIRS/Sepsis, ESRD, HTN emergency. PMH L fibular fracture NWB status, CKD, COPD, DM, HTN, THA, AV fistula, total shoulder R   Clinical Impression   Patient received in bed, pleasant and willing to participate with skilled PT services however reporting urgent need to BM. Patient able to perform basic bed mobility with min guard however did require further education regarding NWB L LE as well as Min-Mod intervention from PT to maintain NWB L LE with sit to stand transfers. Patient with poor standing tolerance, able to maintain static standing less than 30 seconds on first standing attempt (patient sat back down to bed stating, "I can't do this"); on second attempt, patient stood and then immediately sat back down to bed, reporting fatigue and presenting with significant unsteadiness. Patient then stated that he had done transfers last night and began to attempt to stand up with weight down through L LE, requiring immediate PT intervention to maintain NWB status L LE and patient safety. Patient returned to bed and placed on bedpan to address BM; RN educated regarding NWB status L LE as well as patient's mobility level. Note evaluation was performed with just +1 assist; recommend +2 for safety/equipment to further safely progress mobility as able. Patient left in bed with all needs met this afternoon.     Follow Up Recommendations CIR(pending progress )    Equipment Recommendations  None recommended by PT(TBD pending progress )    Recommendations for Other Services Rehab consult;OT consult     Precautions / Restrictions Precautions Precautions: Fall;Other  (comment) Precaution Comments: NWB L LE  Required Braces or Orthoses: Other Brace/Splint Other Brace/Splint: cast/wrap L LE  Restrictions Weight Bearing Restrictions: Yes LLE Weight Bearing: Non weight bearing Other Position/Activity Restrictions: assume NWB L LE  per attending MD       Mobility  Bed Mobility Overal bed mobility: Needs Assistance Bed Mobility: Supine to Sit;Sit to Supine     Supine to sit: Min guard Sit to supine: Min guard      Transfers Overall transfer level: Needs assistance Equipment used: Rolling walker (2 wheeled) Transfers: Sit to/from Stand Sit to Stand: Mod assist         General transfer comment: NWB L LE; able to maintain static standing NWB L LE for less than 30 seconds on first attempt, on second attempt stood and sat right back down with very poor control and safety; DNT stand pivot due to safety concerns with assist of just +1 at evaluation    Ambulation/Gait             General Gait Details: DNT due to safety concerns   Stairs            Wheelchair Mobility    Modified Rankin (Stroke Patients Only)       Balance Overall balance assessment: Needs assistance;History of Falls Sitting-balance support: Bilateral upper extremity supported Sitting balance-Leahy Scale: Good     Standing balance support: Bilateral upper extremity supported Standing balance-Leahy Scale: Poor Standing balance comment: reliant on B UE support, poor standing balance and endurance when NWB L LE  Pertinent Vitals/Pain Pain Assessment: No/denies pain    Home Living Family/patient expects to be discharged to:: Private residence Living Arrangements: Alone Available Help at Discharge: Family Type of Home: House Home Access: Level entry     Home Layout: (unable to obtain complete PLOF history due to urgency of attempting BM ) Home Equipment: None Additional Comments: PLOF information taken from prior  chart review     Prior Function Level of Independence: Independent               Hand Dominance   Dominant Hand: Right    Extremity/Trunk Assessment   Upper Extremity Assessment Upper Extremity Assessment: Defer to OT evaluation    Lower Extremity Assessment Lower Extremity Assessment: Generalized weakness    Cervical / Trunk Assessment Cervical / Trunk Assessment: Kyphotic  Communication   Communication: No difficulties  Cognition Arousal/Alertness: Awake/alert Behavior During Therapy: WFL for tasks assessed/performed Overall Cognitive Status: No family/caregiver present to determine baseline cognitive functioning                                        General Comments      Exercises     Assessment/Plan    PT Assessment Patient needs continued PT services  PT Problem List Decreased strength;Decreased mobility;Decreased safety awareness;Decreased coordination;Decreased balance       PT Treatment Interventions DME instruction;Therapeutic activities;Gait training;Therapeutic exercise;Patient/family education;Stair training;Balance training;Functional mobility training;Neuromuscular re-education    PT Goals (Current goals can be found in the Care Plan section)  Acute Rehab PT Goals Patient Stated Goal: to go home  PT Goal Formulation: With patient Time For Goal Achievement: 02/10/17 Potential to Achieve Goals: Good    Frequency Min 3X/week   Barriers to discharge        Co-evaluation               AM-PAC PT "6 Clicks" Daily Activity  Outcome Measure Difficulty turning over in bed (including adjusting bedclothes, sheets and blankets)?: Unable Difficulty moving from lying on back to sitting on the side of the bed? : Unable Difficulty sitting down on and standing up from a chair with arms (e.g., wheelchair, bedside commode, etc,.)?: Unable Help needed moving to and from a bed to chair (including a wheelchair)?: A Lot Help needed  walking in hospital room?: A Lot Help needed climbing 3-5 steps with a railing? : Total 6 Click Score: 8    End of Session Equipment Utilized During Treatment: Gait belt;Oxygen Activity Tolerance: Patient tolerated treatment well Patient left: in bed;with call bell/phone within reach;with bed alarm set Nurse Communication: Mobility status;Weight bearing status;Other (comment)(patient on bedpan, NWB L LE, recommendation of +2 for transfers for safety ) PT Visit Diagnosis: Unsteadiness on feet (R26.81);Muscle weakness (generalized) (M62.81);Difficulty in walking, not elsewhere classified (R26.2)    Time: 1194-1740 PT Time Calculation (min) (ACUTE ONLY): 23 min   Charges:   PT Evaluation $PT Eval Moderate Complexity: 1 Mod PT Treatments $Self Care/Home Management: 8-22   PT G Codes:   PT G-Codes **NOT FOR INPATIENT CLASS** Functional Assessment Tool Used: AM-PAC 6 Clicks Basic Mobility;Clinical judgement Functional Limitation: Mobility: Walking and moving around Mobility: Walking and Moving Around Current Status (C1448): At least 60 percent but less than 80 percent impaired, limited or restricted Mobility: Walking and Moving Around Goal Status [phone]): At least 40 percent but less than 60 percent impaired, limited or restricted  Deniece Ree PT, DPT, CBIS  Supplemental Physical Therapist [facility]

## 2017-01-28 LAB — GLUCOSE, CAPILLARY
GLUCOSE-CAPILLARY: 107 mg/dL — AB (ref 65–99)
Glucose-Capillary: 114 mg/dL — ABNORMAL HIGH (ref 65–99)
Glucose-Capillary: 116 mg/dL — ABNORMAL HIGH (ref 65–99)
Glucose-Capillary: 110 mg/dL — ABNORMAL HIGH (ref 65–99)

## 2017-01-28 LAB — BLOOD GAS, ARTERIAL
Acid-Base Excess: 2.2 mmol/L — ABNORMAL HIGH (ref 0.0–2.0)
BICARBONATE: 26.9 mmol/L (ref 20.0–28.0)
Drawn by: 313941
O2 CONTENT: 2 L/min
O2 SAT: 97.6 %
PATIENT TEMPERATURE: 99.4
PO2 ART: 105 mmHg (ref 83.0–108.0)
pCO2 arterial: 48.5 mmHg — ABNORMAL HIGH (ref 32.0–48.0)
pH, Arterial: 7.366 (ref 7.350–7.450)

## 2017-01-28 LAB — CBC WITH DIFFERENTIAL/PLATELET
Basophils Absolute: 0 10*3/uL (ref 0.0–0.1)
Basophils Relative: 0 %
EOS ABS: 0.4 10*3/uL (ref 0.0–0.7)
Eosinophils Relative: 3 %
HEMATOCRIT: 30.7 % — AB (ref 39.0–52.0)
HEMOGLOBIN: 10 g/dL — AB (ref 13.0–17.0)
LYMPHS ABS: 2 10*3/uL (ref 0.7–4.0)
Lymphocytes Relative: 16 %
MCH: 33.9 pg (ref 26.0–34.0)
MCHC: 32.6 g/dL (ref 30.0–36.0)
MCV: 104.1 fL — ABNORMAL HIGH (ref 78.0–100.0)
MONOS PCT: 12 %
Monocytes Absolute: 1.5 10*3/uL — ABNORMAL HIGH (ref 0.1–1.0)
NEUTROS ABS: 8.4 10*3/uL — AB (ref 1.7–7.7)
NEUTROS PCT: 69 %
Platelets: 247 10*3/uL (ref 150–400)
RBC: 2.95 MIL/uL — ABNORMAL LOW (ref 4.22–5.81)
RDW: 14.1 % (ref 11.5–15.5)
WBC: 12.2 10*3/uL — ABNORMAL HIGH (ref 4.0–10.5)

## 2017-01-28 MED ORDER — METOPROLOL SUCCINATE ER 25 MG PO TB24
12.5000 mg | ORAL_TABLET | ORAL | Status: DC
  Administered 2017-01-28 – 2017-02-02 (×4): 12.5 mg via ORAL
  Filled 2017-01-28 (×4): qty 1

## 2017-01-28 NOTE — Progress Notes (Signed)
Admit: 01/20/2017 LOS: 8  61M ESRD [facility] MWF with AMS, dysphagia  Subjective:  HD yesterday: post weight 88.8kg, 3L UF; tolerated w/o complication NO c/o. Feels well.    12/19 0701 - 12/20 0700 In: -  Out: 3275 [Urine:275]  Filed Weights   01/27/17 0933 01/27/17 1349 01/28/17 0400  Weight: 91.3 kg (201 lb 4.5 oz) 88.8 kg (195 lb 12.3 oz) 88.8 kg (195 lb 12.3 oz)    Scheduled Meds: . alteplase  4 mg Intracatheter Once  . amLODipine  10 mg Oral Daily  . atorvastatin  20 mg Oral q1800  . calcitRIOL  2 mcg Oral Q M,W,F  . calcium acetate  1,334 mg Oral TID WC  . cinacalcet  120 mg Oral Once per day on Mon Wed Fri  . folic acid  1 mg Oral Daily  . heparin  5,000 Units Subcutaneous Q8H  . insulin aspart  0-15 Units Subcutaneous TID AC & HS  . pantoprazole  40 mg Oral Daily  . tamsulosin  0.4 mg Oral QPC supper   Continuous Infusions: . piperacillin-tazobactam (ZOSYN)  IV 3.375 g (01/28/17 1000)   PRN Meds:.acetaminophen **OR** acetaminophen, hydrALAZINE, ipratropium-albuterol, ondansetron **OR** ondansetron (ZOFRAN) IV  Current Labs: reviewed    Physical Exam:  Blood pressure (!) 160/78, pulse 71, temperature 99.2 F (37.3 C), temperature source Oral, resp. rate (!) 26, height 5\' 8"  (1.727 m), weight 88.8 kg (195 lb 12.3 oz), SpO2 99 %. RRR RUE AVF +B/T CTAB No Trusty LLE wrapped  Dialysis Orders: MWF [facility] 4hr 27min RIJ cath (rt BCF revised by Dr. Donzetta Matters + PTA by Dr. Scot Dock) 2/2.5 EDW 95.5kg Heparin 8600 bolus     Calcitriol 16mcg PO TIW           Last Mircera 50 given on 11/28          Sensipar 120mg  PO TIW   A 1. ESRD MWF [facility] 2. AMS seems improved 3. Recent L fibular leg fracture 4. AMS 5. Anemia of CKD 6. CKD BMD on C3, Sensipar, CaAcetate 7. DM 8. COPD 9. HTN on amlodpine; bp stable 10. Dysphagia  P 1. Cont MWF schedule; under EDW probe for lower post weights; 3K 2.5 Ca; 400/800 using TDC; bolus heparin   Pearson Grippe MD 01/28/2017, 10:13 AM  Recent  Labs  Lab 01/26/17 0432 01/26/17 2150 01/27/17 0917  NA 134* 133* 132*  K 3.9 3.9 3.8  CL 96* 96* 94*  CO2 28 27 26   GLUCOSE 133* 129* 187*  BUN 25* 35* 38*  CREATININE 6.43* 8.43* 9.25*  CALCIUM 8.4* 8.6* 8.3*  PHOS 6.0*  --  7.4*   Recent Labs  Lab 01/26/17 0432 01/27/17 0237 01/28/17 0532  WBC 9.0 8.5 12.2*  NEUTROABS 6.6 5.4 8.4*  HGB 10.8* 10.1* 10.0*  HCT 33.2* 29.7* 30.7*  MCV 104.7* 103.1* 104.1*  PLT 225 227 247

## 2017-01-28 NOTE — Progress Notes (Addendum)
Patient needs Outpatient Sleep Study; orders faxed to [facility] as requested;Date for Sleep Sleep Study is Jan 20,2019 at 8 pm;B [place] RN,MHA,BSN [phone]

## 2017-01-28 NOTE — Progress Notes (Signed)
PROGRESS NOTE    [name]  YIF:[id number] DOB: [birth year] DOA: 01/20/2017 PCP: [facility] [place]   Specialists:     Brief Narrative:  66 year old male ESRD on HD MWF HTN DM TY 2 HLD prostate Ca s/p prostatectomy, ventral hernia repair  fall 12/11 fractured distal and left fibula sent home on pain medication follow-up with orthopedics Daughter received a call 12/12 as patient acutely altered missed dialysis-came to emergency room-workup revealed K6.8 BUN/creatinine 86/14 anion gap 17 CXR negative CT head negative EKG T wave peak Neurology consult and nephrology consult  Assessment & Plan:   Principal Problem:   Hyperkalemia Active Problems:   Diabetes mellitus ([place])   End stage renal disease on dialysis ([place])   SIRS (systemic inflammatory response syndrome) ([place])   Hypertensive urgency   Closed fracture of distal end of left fibula   Anemia due to chronic kidney disease   Acute metabolic encephalopathy   Abnormal swallowing   Altered mental status   Toxic metabolic encephalopathy 2/2 to meds and Uremia  MRI moderate small vessel disease, blood cultures negative pro-calcitonin minimally elevated, ammonia negative-EEG generalized irregular recurrent metabolic encephalopathy 86/76?  Sleepiness 12/18 pm-had some parasomnias 12/20 am Speech therapy consulted recommending mechanical soft thin liquids--has overall improved  ? OSA/Parasomnia Snores at night and sleeps "Hard"  somnolence on 12/18 pm and was difficult to arouse as OP does need formal OSA testing split somnogram-referral placed to CM for management  ?  Sepsis  cultures negative on empiric Zosyn treating for 7 days since stopping 12/20-white count trend 15-->9.0-->12---vancomycin d/c 12/16  Hiccups Trial of Thorazine 25 IM x 1 Resolved completely  Dysphagia Speech is seen and recommended dysphagia 3 diet mighjt be able to grad diet?  ESRD MWF Hyperkalemia-resolved with treatment calcium gluconate  dextrose etc. currently 3.8 Anemia of renal disease Secondary hyperparathyroidism Defer to nephrology Continue calcitriol 2 mcg MWF, Sensipar 120  Hypertensive urgency on admission and still not controlled 208/97 now better lisinopril has been held Continue amlodipine 10, hydralazine 10 every 4 as needed blood pressure >180--Adding toprol xl 25 t-th-s-s  Closed fracture distal end of left fibula secondary to fall Continue Ace wrap splint and needs follow-up with Dr. Percell Miller of orthopedics as an outpatient nwb presumed and OP therapy  Hyperlipidemia Continue Lipitor 20  Will probably need SNF?  CIR?  OT consulted 12/20 for eval No family present currently Await dispo  Macrocytosis-mild leukocytosis Continue b12 If persists will consider work-up with hematology as OP   ?CIR after OT eval Inpatient pending resolution Called but unable to reachdaughter  Move off SDU-->med surg status--no need tele   Consultants:   Nephrology  Procedures:   None  Antimicrobials:   Zosyn   Subjective:  Awake alert this morning ~ 01:00 thought he was getting on a place helicopter pulled out all devices--Has been in [place] in 1969 and had a "flashback" he thinks Overall much improved-sitting and laughing in chair oriented and in nad  Objective: Vitals:   01/28/17 0031 01/28/17 0400 01/28/17 0800 01/28/17 0825  BP: 139/82 129/69 (!) 160/78   Pulse: (!) 106 92  71  Resp: 19 (!) 26    Temp:  98.4 F (36.9 C)  99.2 F (37.3 C)  TempSrc:  Oral  Oral  SpO2: 98% 98%  99%  Weight:  88.8 kg (195 lb 12.3 oz)    Height:        Intake/Output Summary (Last 24 hours) at 01/28/2017 1142 Last data  filed at 01/28/2017 5188 Gross per 24 hour  Intake 360 ml  Output 3275 ml  Net -2915 ml   Filed Weights   01/27/17 0933 01/27/17 1349 01/28/17 0400  Weight: 91.3 kg (201 lb 4.5 oz) 88.8 kg (195 lb 12.3 oz) 88.8 kg (195 lb 12.3 oz)    Examination:  alert pleasant in nad Chest clear  no added sound abd soft nt nd no rebound no guard Moves 4 limbs equally without deficit LLE wrapped  Data Reviewed: I have personally reviewed following labs and imaging studies  CBC: Recent Labs  Lab 01/24/17 0245 01/24/17 2122 01/25/17 0306 01/26/17 0432 01/27/17 0237 01/28/17 0532  WBC 12.9* 13.3* 11.0* 9.0 8.5 12.2*  NEUTROABS 9.6*  --  8.0* 6.6 5.4 8.4*  HGB 11.2* 10.6* 11.2* 10.8* 10.1* 10.0*  HCT 34.2* 32.9* 33.8* 33.2* 29.7* 30.7*  MCV 107.2* 105.4* 105.0* 104.7* 103.1* 104.1*  PLT 223 265 243 225 227 416   Basic Metabolic Panel: Recent Labs  Lab 01/24/17 0905 01/25/17 0306 01/26/17 0432 01/26/17 2150 01/27/17 0917  NA 137 135 134* 133* 132*  K 4.9 4.3 3.9 3.9 3.8  CL 96* 97* 96* 96* 94*  CO2 23 25 28 27 26   GLUCOSE 97 140* 133* 129* 187*  BUN 82* 48* 25* 35* 38*  CREATININE 12.65* 8.85* 6.43* 8.43* 9.25*  CALCIUM 9.1 8.8* 8.4* 8.6* 8.3*  PHOS  --   --  6.0*  --  7.4*   GFR: Estimated Creatinine Clearance: 8.3 mL/min (A) (by C-G formula based on SCr of 9.25 mg/dL (H)). Liver Function Tests: Recent Labs  Lab 01/22/17 1147 01/26/17 2150 01/27/17 0917  AST 26 37  --   ALT 14* 46  --   ALKPHOS 42 34*  --   BILITOT 1.5* 0.5  --   PROT 6.9 6.0*  --   ALBUMIN 3.6 3.2* 2.9*   No results for input(s): LIPASE, AMYLASE in the last 168 hours. Recent Labs  Lab 01/24/17 0905  AMMONIA 52*   Coagulation Profile: No results for input(s): INR, PROTIME in the last 168 hours. Cardiac Enzymes: No results for input(s): CKTOTAL, CKMB, CKMBINDEX, TROPONINI in the last 168 hours. BNP (last 3 results) No results for input(s): PROBNP in the last 8760 hours. HbA1C: No results for input(s): HGBA1C in the last 72 hours. CBG: Recent Labs  Lab 01/27/17 0606 01/27/17 0822 01/27/17 1654 01/27/17 2118 01/28/17 0635  GLUCAP 96 160* 216* 107* 114*   Lipid Profile: No results for input(s): CHOL, HDL, LDLCALC, TRIG, CHOLHDL, LDLDIRECT in the last 72 hours. Thyroid  Function Tests: No results for input(s): TSH, T4TOTAL, FREET4, T3FREE, THYROIDAB in the last 72 hours. Anemia Panel: No results for input(s): VITAMINB12, FOLATE, FERRITIN, TIBC, IRON, RETICCTPCT in the last 72 hours. Urine analysis:    Component Value Date/Time   COLORURINE YELLOW 12/15/2016 1736   APPEARANCEUR CLEAR 12/15/2016 1736   LABSPEC 1.011 12/15/2016 1736   PHURINE 7.0 12/15/2016 1736   GLUCOSEU 50 (A) 12/15/2016 1736   HGBUR NEGATIVE 12/15/2016 1736   BILIRUBINUR NEGATIVE 12/15/2016 1736   KETONESUR NEGATIVE 12/15/2016 1736   PROTEINUR >=300 (A) 12/15/2016 1736   UROBILINOGEN 0.2 09/08/2007 1345   NITRITE NEGATIVE 12/15/2016 1736   LEUKOCYTESUR NEGATIVE 12/15/2016 1736     Radiology Studies: Reviewed images personally in health database    Scheduled Meds: . alteplase  4 mg Intracatheter Once  . amLODipine  10 mg Oral Daily  . atorvastatin  20 mg Oral q1800  .  calcitRIOL  2 mcg Oral Q M,W,F  . calcium acetate  1,334 mg Oral TID WC  . cinacalcet  120 mg Oral Once per day on Mon Wed Fri  . folic acid  1 mg Oral Daily  . heparin  5,000 Units Subcutaneous Q8H  . insulin aspart  0-15 Units Subcutaneous TID AC & HS  . pantoprazole  40 mg Oral Daily  . tamsulosin  0.4 mg Oral QPC supper   Continuous Infusions: . piperacillin-tazobactam (ZOSYN)  IV 3.375 g (01/28/17 1000)     LOS: 8 days    Time spent: [place], MD [facility] ([facility]   If 7PM-7AM, please contact night-coverage [email or web address] Password [facility] 01/28/2017, 11:42 AM

## 2017-01-28 NOTE — NC FL2 (Signed)
[place] LEVEL OF CARE SCREENING TOOL     IDENTIFICATION  Patient Name: [name] Birthdate: January 03, 1951 Sex: male Admission Date (Current Location): 01/20/2017  [facility] [place] Number:  Herbalist and Address:  [facility], [place] [address], [place], [place] [address]      Provider Number: [id number]  Attending Physician Name and Address:  Nita Sells, MD  Relative Name and Phone Number:       Current Level of Care: Hospital Recommended Level of Care: [place] Prior Approval Number:    Date Approved/Denied:   PASRR Number: [id number]  Discharge Plan: SNF    Current Diagnoses: Patient Active Problem List   Diagnosis Date Noted  . Abnormal swallowing   . Altered mental status   . SIRS (systemic inflammatory response syndrome) ([place]) 01/21/2017  . Hypertensive urgency 01/21/2017  . Closed fracture of distal end of left fibula 01/21/2017  . Anemia due to chronic kidney disease 01/21/2017  . Acute metabolic encephalopathy 16/08/3708  . Hyperkalemia 01/20/2017  . Acute respiratory distress   . End stage renal disease on dialysis ([place])   . Influenza 02/28/2016  . Acute respiratory failure with hypoxia ([place]) 03/14/2015  . Acute renal failure superimposed on stage 4 chronic kidney disease ([place]) 03/13/2015  . Acute renal failure (ARF) ([place]) 03/09/2015  . COPD exacerbation ([place])   . Diabetes mellitus ([place]) 03/08/2015  . Anemia 03/08/2015  . Acute on chronic renal failure ([place]) 03/08/2015  . Urinary retention 03/08/2015  . Kidney lesion, native, left 03/08/2015  . Pericardial effusion 03/08/2015  . COPD IV / still smoking  03/08/2015  . Cigarette smoker 03/08/2015  . CHRONIC KIDNEY DISEASE STAGE III (MODERATE) 11/06/2009  . ELEVATED PROSTATE SPECIFIC ANTIGEN 11/04/2009  . HYPERLIPIDEMIA 10/10/2009  . ERECTILE DYSFUNCTION 10/10/2009  . Essential hypertension 10/10/2009  . PREDIABETES  10/10/2009  . COLONIC POLYPS, HX OF 10/10/2009    Orientation RESPIRATION BLADDER Height & Weight     Self, Time, Situation, Place  Normal Incontinent, External catheter(catheter placed 01/25/17) Weight: 195 lb 12.3 oz (88.8 kg) Height:  5\' 8"  (172.7 cm)  BEHAVIORAL SYMPTOMS/MOOD NEUROLOGICAL BOWEL NUTRITION STATUS      Continent Diet(carb modified)  AMBULATORY STATUS COMMUNICATION OF NEEDS Skin   Extensive Assist Verbally Normal                       Personal Care Assistance Level of Assistance  Bathing, Feeding, Dressing Bathing Assistance: Maximum assistance Feeding assistance: Independent Dressing Assistance: Maximum assistance     Functional Limitations Info  Sight, Hearing, Speech Sight Info: Impaired(wears glasses) Hearing Info: Adequate Speech Info: Adequate    SPECIAL CARE FACTORS FREQUENCY  PT (By licensed PT), OT (By licensed OT)     PT Frequency: 5x/wk OT Frequency: 5x/wk            Contractures Contractures Info: Not present    Additional Factors Info  Code Status, Allergies, Insulin Sliding Scale Code Status Info: full Allergies Info: NKA   Insulin Sliding Scale Info: 0-15 units 3x/day before meals and at bedtime       Current Medications (01/28/2017):  This is the current hospital active medication list Current Facility-Administered Medications  Medication Dose Route Frequency Provider Last Rate Last Dose  . acetaminophen (TYLENOL) tablet 650 mg  650 mg Oral Q6H PRN Fuller Plan A, MD   650 mg at 01/28/17 6269   Or  . acetaminophen (TYLENOL) suppository  650 mg  650 mg Rectal Q6H PRN Fuller Plan A, MD      . alteplase (CATHFLO ACTIVASE) injection 4 mg  4 mg Intracatheter Once Dwana Melena, MD      . amLODipine (NORVASC) tablet 10 mg  10 mg Oral Daily Alma Friendly, MD   10 mg at 01/28/17 1000  . atorvastatin (LIPITOR) tablet 20 mg  20 mg Oral q1800 Alma Friendly, MD   20 mg at 01/27/17 1703  . calcitRIOL (ROCALTROL)  capsule 2 mcg  2 mcg Oral Q M,W,F Dwana Melena, MD   2 mcg at 01/27/17 [phone]  . calcium acetate (PHOSLO) capsule 1,334 mg  1,334 mg Oral TID WC Alma Friendly, MD   1,334 mg at 01/28/17 1144  . cinacalcet (SENSIPAR) tablet 120 mg  120 mg Oral Once per day on Mon Wed Fri Dwana Melena, MD   120 mg at 01/27/17 1696  . folic acid (FOLVITE) tablet 1 mg  1 mg Oral Daily Alma Friendly, MD   1 mg at 01/28/17 1000  . heparin injection 5,000 Units  5,000 Units Subcutaneous Q8H Fuller Plan A, MD   5,000 Units at 01/28/17 1351  . hydrALAZINE (APRESOLINE) injection 10 mg  10 mg Intravenous Q4H PRN Fuller Plan A, MD   10 mg at 01/22/17 1054  . insulin aspart (novoLOG) injection 0-15 Units  0-15 Units Subcutaneous TID AC & HS Alma Friendly, MD   5 Units at 01/27/17 1704  . ipratropium-albuterol (DUONEB) 0.5-2.5 (3) MG/3ML nebulizer solution 3 mL  3 mL Nebulization Q4H PRN Smith, Rondell A, MD      . metoprolol succinate (TOPROL-XL) 24 hr tablet 12.5 mg  12.5 mg Oral Q T,Th,S,Su Nita Sells, MD   12.5 mg at 01/28/17 1224  . ondansetron (ZOFRAN) tablet 4 mg  4 mg Oral Q6H PRN Fuller Plan A, MD       Or  . ondansetron (ZOFRAN) injection 4 mg  4 mg Intravenous Q6H PRN Fuller Plan A, MD   4 mg at 01/25/17 1904  . pantoprazole (PROTONIX) EC tablet 40 mg  40 mg Oral Daily Alma Friendly, MD   40 mg at 01/28/17 1000  . tamsulosin (FLOMAX) capsule 0.4 mg  0.4 mg Oral QPC supper Alma Friendly, MD   0.4 mg at 01/27/17 1704     Discharge Medications: Please see discharge summary for a list of discharge medications.  Relevant Imaging Results:  Relevant Lab Results:   Additional Information SS#: [id number]  Geralynn Ochs, LCSW

## 2017-01-28 NOTE — Progress Notes (Signed)
Hourly round being done, room light on. Entered room with RN at bedside. Pt stated " I woke up confused dreaming about moving furniture". Pt had pulled IV out and condem cath off. Replaced IV and condem cath. Bed alarm turned back on and call bell with pt. Will continue to monitor. Cato Mulligan RN

## 2017-01-28 NOTE — Progress Notes (Signed)
Pt woke up from sleep confused and combative. CNA called out for help. Entered room and CNA stated "pt was swinging at me as I was trying to keep him in the bed. Pt confused and oriented only to self. After some time awake pt oriented to place and year and person. Will continue to monitor. Vicente Males Therapist, sports

## 2017-01-28 NOTE — Progress Notes (Signed)
Physical Therapy Treatment Patient Details Name: [name] MRN: [id number] DOB: [birth year] Today's Date: 01/28/2017    History of Present Illness 66yo male who was found on floor at home by daughter after missing dialysis, also recently admitted with L fibular fracture. Diagnosed with hyperkalemia, acute metaboilc encephalopathy, SIRS/Sepsis, ESRD, HTN emergency. PMH L fibular fracture NWB status, CKD, COPD, DM, HTN, THA, AV fistula, total shoulder R     PT Comments    Patient received in bed, pleasant and willing to work with skilled PT services today; note +2 assistance was provided with PT Tech present this session. Patient continues to do well with general bed mobility, however continues to require Min-Mod assist for functional transfers and continues to have significant difficulty maintaining NWB status L LE during standing tasks. Education provided on importance of maintaining NWB as well as potential benefits of attending CIR for further rehabilitation and assistance in returning to PLOF. Patient left up in chair with all needs met and chair alarm activated this morning; CNA educated regarding mobility and NWB status.     Follow Up Recommendations  CIR(pending progress )     Equipment Recommendations  None recommended by PT(TBD )    Recommendations for Other Services Rehab consult;OT consult     Precautions / Restrictions Precautions Precautions: Fall;Other (comment) Precaution Comments: NWB L LE  Required Braces or Orthoses: Other Brace/Splint Other Brace/Splint: cast/wrap L LE  Restrictions Weight Bearing Restrictions: Yes LLE Weight Bearing: Non weight bearing Other Position/Activity Restrictions: assume NWB L LE  per attending MD     Mobility  Bed Mobility Overal bed mobility: Needs Assistance Bed Mobility: Supine to Sit     Supine to sit: Supervision        Transfers Overall transfer level: Needs assistance Equipment used: Rolling walker (2  wheeled) Transfers: Sit to/from [facility] Sit to Stand: Min assist Stand pivot transfers: Mod assist       General transfer comment: required less assistance for transfer itself, however continues to have difficulty maintaining NWB/needs assistance for weight bearing status. Mod assist required for stand pivot to maintain NWB status and manage walker   Ambulation/Gait             General Gait Details: DNT due to safety concerns/difficulty with maintaining NWB status during transfer    Stairs            Wheelchair Mobility    Modified Rankin (Stroke Patients Only)       Balance Overall balance assessment: Needs assistance;History of Falls Sitting-balance support: Bilateral upper extremity supported;Feet supported Sitting balance-Leahy Scale: Good     Standing balance support: Bilateral upper extremity supported;During functional activity Standing balance-Leahy Scale: Fair Standing balance comment: reliant on B UE support                             Cognition Arousal/Alertness: Awake/alert Behavior During Therapy: WFL for tasks assessed/performed Overall Cognitive Status: Within Functional Limits for tasks assessed                                        Exercises      General Comments        Pertinent Vitals/Pain Pain Assessment: No/denies pain    Home Living  Prior Function            PT Goals (current goals can now be found in the care plan section) Acute Rehab PT Goals Patient Stated Goal: to go home  PT Goal Formulation: With patient Time For Goal Achievement: 02/10/17 Potential to Achieve Goals: Good Progress towards PT goals: Progressing toward goals    Frequency    Min 5X/week      PT Plan Current plan remains appropriate    Co-evaluation              AM-PAC PT "6 Clicks" Daily Activity  Outcome Measure  Difficulty turning over in bed  (including adjusting bedclothes, sheets and blankets)?: Unable Difficulty moving from lying on back to sitting on the side of the bed? : Unable Difficulty sitting down on and standing up from a chair with arms (e.g., wheelchair, bedside commode, etc,.)?: Unable Help needed moving to and from a bed to chair (including a wheelchair)?: A Little Help needed walking in hospital room?: A Lot Help needed climbing 3-5 steps with a railing? : Total 6 Click Score: 9    End of Session Equipment Utilized During Treatment: Gait belt Activity Tolerance: Patient tolerated treatment well Patient left: in chair;with chair alarm set Nurse Communication: Mobility status;Weight bearing status(CNA educated on mobility, recommendation for +2 for transfer back to bed, NWB L LE ) PT Visit Diagnosis: Unsteadiness on feet (R26.81);Muscle weakness (generalized) (M62.81);Difficulty in walking, not elsewhere classified (R26.2)     Time: 9295-7473 PT Time Calculation (min) (ACUTE ONLY): 23 min  Charges:  $Therapeutic Activity: 8-22 mins $Self Care/Home Management: 8-22                    G Codes:       Deniece Ree PT, DPT, CBIS  Supplemental Physical Therapist [place]

## 2017-01-28 NOTE — Progress Notes (Signed)
While in room pt talking about random things to self looking out window pointing at things making conversation to self. Pt seems to be confused. Redirected pt and pt is oriented. Dr. Verlon Au pged and order for safety sitter placed. Charge RN [employer] notified. Will continue to monitor. Vicente Males Therapist, sports

## 2017-01-28 NOTE — Progress Notes (Signed)
Pt placed on BIPAP 12/6 with 2lpm oxygen.  Pt tolerating well at this time.

## 2017-01-29 ENCOUNTER — Other Ambulatory Visit (HOSPITAL_BASED_OUTPATIENT_CLINIC_OR_DEPARTMENT_OTHER): Payer: Self-pay

## 2017-01-29 DIAGNOSIS — G4752 REM sleep behavior disorder: Secondary | ICD-10-CM

## 2017-01-29 DIAGNOSIS — G4730 Sleep apnea, unspecified: Secondary | ICD-10-CM

## 2017-01-29 LAB — GLUCOSE, CAPILLARY
GLUCOSE-CAPILLARY: 91 mg/dL (ref 65–99)
GLUCOSE-CAPILLARY: 167 mg/dL — AB (ref 65–99)
GLUCOSE-CAPILLARY: 116 mg/dL — AB (ref 65–99)
Glucose-Capillary: 81 mg/dL (ref 65–99)

## 2017-01-29 LAB — BASIC METABOLIC PANEL
ANION GAP: 13 (ref 5–15)
BUN: 37 mg/dL — ABNORMAL HIGH (ref 6–20)
CALCIUM: 8.9 mg/dL (ref 8.9–10.3)
CHLORIDE: 98 mmol/L — AB (ref 101–111)
CO2: 22 mmol/L (ref 22–32)
Creatinine, Ser: 8.66 mg/dL — ABNORMAL HIGH (ref 0.61–1.24)
GFR calc non Af Amer: 6 mL/min — ABNORMAL LOW (ref >60)
GFR, EST AFRICAN AMERICAN: 6 mL/min — AB (ref >60)
GLUCOSE: 90 mg/dL (ref 65–99)
Potassium: 4.6 mmol/L (ref 3.5–5.1)
Sodium: 133 mmol/L — ABNORMAL LOW (ref 135–145)

## 2017-01-29 LAB — CBC WITH DIFFERENTIAL/PLATELET
BASOS ABS: 0 10*3/uL (ref 0.0–0.1)
BASOS PCT: 0 %
Eosinophils Absolute: 0.4 10*3/uL (ref 0.0–0.7)
Eosinophils Relative: 4 %
HEMATOCRIT: 31.1 % — AB (ref 39.0–52.0)
HEMOGLOBIN: 10.6 g/dL — AB (ref 13.0–17.0)
LYMPHS PCT: 19 %
Lymphs Abs: 2.1 10*3/uL (ref 0.7–4.0)
MCH: 35.6 pg — ABNORMAL HIGH (ref 26.0–34.0)
MCHC: 34.1 g/dL (ref 30.0–36.0)
MCV: 104.4 fL — AB (ref 78.0–100.0)
MONO ABS: 0.9 10*3/uL (ref 0.1–1.0)
MONOS PCT: 8 %
NEUTROS ABS: 7.8 10*3/uL — AB (ref 1.7–7.7)
NEUTROS PCT: 69 %
Platelets: 272 10*3/uL (ref 150–400)
RBC: 2.98 MIL/uL — ABNORMAL LOW (ref 4.22–5.81)
RDW: 14.3 % (ref 11.5–15.5)
WBC: 11.3 10*3/uL — ABNORMAL HIGH (ref 4.0–10.5)

## 2017-01-29 LAB — RENAL FUNCTION PANEL
ALBUMIN: 3.5 g/dL (ref 3.5–5.0)
ANION GAP: 12 (ref 5–15)
BUN: 34 mg/dL — AB (ref 6–20)
CHLORIDE: 99 mmol/L — AB (ref 101–111)
CO2: 24 mmol/L (ref 22–32)
Calcium: 9.1 mg/dL (ref 8.9–10.3)
Creatinine, Ser: 8.86 mg/dL — ABNORMAL HIGH (ref 0.61–1.24)
GFR, EST AFRICAN AMERICAN: 6 mL/min — AB (ref >60)
GFR, EST NON AFRICAN AMERICAN: 5 mL/min — AB (ref >60)
Glucose, Bld: 97 mg/dL (ref 65–99)
PHOSPHORUS: 5.9 mg/dL — AB (ref 2.5–4.6)
POTASSIUM: 4.4 mmol/L (ref 3.5–5.1)
Sodium: 135 mmol/L (ref 135–145)

## 2017-01-29 MED ORDER — ALTEPLASE 2 MG IJ SOLR: 2.0000 mg | Freq: Once | INTRAMUSCULAR | Status: DC | PRN

## 2017-01-29 MED ORDER — LIDOCAINE-PRILOCAINE 2.5-2.5 % EX CREA
1.0000 "application " | TOPICAL_CREAM | CUTANEOUS | Status: DC | PRN
  Filled 2017-01-29: qty 5

## 2017-01-29 MED ORDER — LIDOCAINE HCL (PF) 1 % IJ SOLN: 5.0000 mL | INTRAMUSCULAR | Status: DC | PRN

## 2017-01-29 MED ORDER — PENTAFLUOROPROP-TETRAFLUOROETH EX AERO: 1.0000 "application " | INHALATION_SPRAY | CUTANEOUS | Status: DC | PRN

## 2017-01-29 MED ORDER — HEPARIN SODIUM (PORCINE) 1000 UNIT/ML DIALYSIS
8600.0000 [IU] | Freq: Once | INTRAMUSCULAR | Status: DC
  Filled 2017-01-29: qty 9

## 2017-01-29 MED ORDER — SODIUM CHLORIDE 0.9 % IV SOLN: 100.0000 mL | INTRAVENOUS | Status: DC | PRN

## 2017-01-29 MED ORDER — HEPARIN SODIUM (PORCINE) 1000 UNIT/ML DIALYSIS
1000.0000 [IU] | INTRAMUSCULAR | Status: DC | PRN
  Filled 2017-01-29: qty 1

## 2017-01-29 MED ORDER — CALCITRIOL 0.5 MCG PO CAPS
ORAL_CAPSULE | ORAL | Status: AC
  Filled 2017-01-29: qty 4

## 2017-01-29 NOTE — Procedures (Signed)
I was present at this dialysis session. I have reviewed the session itself and made appropriate changes.   Disopsotion SNF vs CIR.  SLeep eval arranged as outpt.    Having periods of agitation and not redirecting, now with sitter.  3L UF, 2K, TDC.  Hb stable.   Next HD 12/23.     Filed Weights   01/27/17 1349 01/28/17 0400 01/29/17 0700  Weight: 88.8 kg (195 lb 12.3 oz) 88.8 kg (195 lb 12.3 oz) 89.8 kg (197 lb 15.6 oz)    Recent Labs  Lab 01/29/17 0314  NA 135  133*  K 4.4  4.6  CL 99*  98*  CO2 24  22  GLUCOSE 97  90  BUN 34*  37*  CREATININE 8.86*  8.66*  CALCIUM 9.1  8.9  PHOS 5.9*    Recent Labs  Lab 01/27/17 0237 01/28/17 0532 01/29/17 0314  WBC 8.5 12.2* 11.3*  NEUTROABS 5.4 8.4* 7.8*  HGB 10.1* 10.0* 10.6*  HCT 29.7* 30.7* 31.1*  MCV 103.1* 104.1* 104.4*  PLT 227 247 272    Scheduled Meds: . alteplase  4 mg Intracatheter Once  . amLODipine  10 mg Oral Daily  . atorvastatin  20 mg Oral q1800  . calcitRIOL  2 mcg Oral Q M,W,F  . calcium acetate  1,334 mg Oral TID WC  . cinacalcet  120 mg Oral Once per day on Mon Wed Fri  . folic acid  1 mg Oral Daily  . heparin  5,000 Units Subcutaneous Q8H  . heparin  8,600 Units Dialysis Once in dialysis  . insulin aspart  0-15 Units Subcutaneous TID AC & HS  . metoprolol succinate  12.5 mg Oral Q T,Th,S,Su  . pantoprazole  40 mg Oral Daily  . tamsulosin  0.4 mg Oral QPC supper   Continuous Infusions: . sodium chloride    . sodium chloride     PRN Meds:.sodium chloride, sodium chloride, acetaminophen **OR** acetaminophen, alteplase, heparin, hydrALAZINE, ipratropium-albuterol, lidocaine (PF), lidocaine-prilocaine, ondansetron **OR** ondansetron (ZOFRAN) IV, pentafluoroprop-tetrafluoroeth   Pearson Grippe  MD 01/29/2017, 8:23 AM

## 2017-01-29 NOTE — Progress Notes (Signed)
PROGRESS NOTE    [name]  CNO:[id number] DOB: August 03, 1950 DOA: 01/20/2017 PCP: [facility]   Specialists:     Brief Narrative:  66 year old male ESRD on HD MWF HTN DM TY 2 HLD prostate Ca s/p prostatectomy, ventral hernia repair  fall 12/11 fractured distal and left fibula sent home on pain medication follow-up with orthopedics Daughter received a call 12/12 as patient acutely altered missed dialysis-came to emergency room-workup revealed K6.8 BUN/creatinine 86/14 anion gap 17 CXR negative CT head negative EKG T wave peak Neurology consult and nephrology consulted Patient inporved and felt to have parasomnias and undiagnosed OSA leading to confusion around sleep-wakefullness  Assessment & Plan:   Principal Problem:   Hyperkalemia Active Problems:   Diabetes mellitus ([place])   End stage renal disease on dialysis ([place])   SIRS (systemic inflammatory response syndrome) ([place])   Hypertensive urgency   Closed fracture of distal end of left fibula   Anemia due to chronic kidney disease   Acute metabolic encephalopathy   Abnormal swallowing   Altered mental status   Toxic metabolic encephalopathy 2/2 to meds and Uremia-possible undiag OSA and parasomnias MRI neg, BCx2 negative pro-calcitonin neg, ammonia negative-EEG generalized irregular recurrent metabolic encephalopathy 29/47?  Sleepiness 12/18 pm-had some parasomnias 12/20 am Awaiting input fro neurology-suspect will need Sleep study and OP management   ? OSA/Parasomnia  somnolence on 12/18 pm and was difficult to arouse as OP does need formal OSA testing split somnogram-referral placed to CM for management--arranging machine and RN did confimr this with CM 12/21  ?  Sepsis  cultures negative on empiric Zosyn treating for 7 days since stopping 12/20-white count trend 15-->9.0-->12---vancomycin d/c 12/16 and has resolved without recurrent concerns for sepsis  Hiccups Trial of Thorazine 25 IM x 1 Resolved  completely  Dysphagia Speech is seen and recommended dysphagia 3 diet Await re-assessment for SLP in terms of graduation of diet  ESRD MWF Hyperkalemia-resolved with treatment calcium gluconate dextrose etc. currently 3.8 Anemia of renal disease Secondary hyperparathyroidism Defer to nephrology Continue calcitriol 2 mcg MWF, Sensipar 120  Hypertensive urgency on admission and still not controlled 208/97 now better lisinopril has been held Continue amlodipine 10, hydralazine 10 every 4 as needed blood pressure >180--Adding toprol xl 25 t-th-s-s BP are still 150 range but seem to be better than prior Adjust going forward as OP  Closed fracture distal end of left fibula secondary to fall Continue Ace wrap splint and needs follow-up with Dr. Percell Miller of orthopedics as an outpatient nwb presumed and OP therapy  Hyperlipidemia Continue Lipitor 20  Will probably need SNF?  CIR?  OT consulted 12/20 for eval No family present currently Await dispo  Macrocytosis-mild leukocytosis Continue b12 If persists will consider work-up with hematology as OP  med surg status--no need tele Refusing SNF and might need HH--will need DME CPAP Asking CIR to see although not sure best candidate for the same in terms of long term post CIR case and planning  Consultants:   Nephrology  Procedures:   None  Antimicrobials:   Zosyn   Subjective:  Some confusion persists when awakening however much more oriented overall when awake No overt pain no fever no chills no n/v  Objective: Vitals:   01/29/17 0930 01/29/17 1000 01/29/17 1030 01/29/17 1100  BP: (!) 152/80 (!) 159/74 (!) 162/83 (!) 166/56  Pulse: 80 82 82 76  Resp: 17 17 18 17   Temp:    98.4 F (36.9 C)  TempSrc:  Oral  SpO2:    99%  Weight:    86.9 kg (191 lb 9.3 oz)  Height:        Intake/Output Summary (Last 24 hours) at 01/29/2017 1549 Last data filed at 01/29/2017 1250 Gross per 24 hour  Intake 480 ml  Output 3525  ml  Net -3045 ml   Filed Weights   01/28/17 0400 01/29/17 0700 01/29/17 1100  Weight: 88.8 kg (195 lb 12.3 oz) 89.8 kg (197 lb 15.6 oz) 86.9 kg (191 lb 9.3 oz)    Examination:  alert pleasant in nad-has CPAP on Chest clear no added sound no rales no rhonchi abd soft nt nd no rebound no guard Moves 4 limbs equally without deficit LLE wrapped  Data Reviewed: I have personally reviewed following labs and imaging studies  CBC: Recent Labs  Lab 01/25/17 0306 01/26/17 0432 01/27/17 0237 01/28/17 0532 01/29/17 0314  WBC 11.0* 9.0 8.5 12.2* 11.3*  NEUTROABS 8.0* 6.6 5.4 8.4* 7.8*  HGB 11.2* 10.8* 10.1* 10.0* 10.6*  HCT 33.8* 33.2* 29.7* 30.7* 31.1*  MCV 105.0* 104.7* 103.1* 104.1* 104.4*  PLT 243 225 227 247 440   Basic Metabolic Panel: Recent Labs  Lab 01/25/17 0306 01/26/17 0432 01/26/17 2150 01/27/17 0917 01/29/17 0314  NA 135 134* 133* 132* 135  133*  K 4.3 3.9 3.9 3.8 4.4  4.6  CL 97* 96* 96* 94* 99*  98*  CO2 25 28 27 26 24  22   GLUCOSE 140* 133* 129* 187* 97  90  BUN 48* 25* 35* 38* 34*  37*  CREATININE 8.85* 6.43* 8.43* 9.25* 8.86*  8.66*  CALCIUM 8.8* 8.4* 8.6* 8.3* 9.1  8.9  PHOS  --  6.0*  --  7.4* 5.9*   GFR: Estimated Creatinine Clearance: 8.8 mL/min (A) (by C-G formula based on SCr of 8.66 mg/dL (H)). Liver Function Tests: Recent Labs  Lab 01/26/17 2150 01/27/17 0917 01/29/17 0314  AST 37  --   --   ALT 46  --   --   ALKPHOS 34*  --   --   BILITOT 0.5  --   --   PROT 6.0*  --   --   ALBUMIN 3.2* 2.9* 3.5   No results for input(s): LIPASE, AMYLASE in the last 168 hours. Recent Labs  Lab 01/24/17 0905  AMMONIA 52*   Coagulation Profile: No results for input(s): INR, PROTIME in the last 168 hours. Cardiac Enzymes: No results for input(s): CKTOTAL, CKMB, CKMBINDEX, TROPONINI in the last 168 hours. BNP (last 3 results) No results for input(s): PROBNP in the last 8760 hours. HbA1C: No results for input(s): HGBA1C in the last 72  hours. CBG: Recent Labs  Lab 01/28/17 1143 01/28/17 1610 01/28/17 2142 01/29/17 0557 01/29/17 1200  GLUCAP 116* 107* 110* 91 81   Lipid Profile: No results for input(s): CHOL, HDL, LDLCALC, TRIG, CHOLHDL, LDLDIRECT in the last 72 hours. Thyroid Function Tests: No results for input(s): TSH, T4TOTAL, FREET4, T3FREE, THYROIDAB in the last 72 hours. Anemia Panel: No results for input(s): VITAMINB12, FOLATE, FERRITIN, TIBC, IRON, RETICCTPCT in the last 72 hours. Urine analysis:    Component Value Date/Time   COLORURINE YELLOW 12/15/2016 1736   APPEARANCEUR CLEAR 12/15/2016 1736   LABSPEC 1.011 12/15/2016 1736   PHURINE 7.0 12/15/2016 1736   GLUCOSEU 50 (A) 12/15/2016 1736   HGBUR NEGATIVE 12/15/2016 1736   BILIRUBINUR NEGATIVE 12/15/2016 1736   KETONESUR NEGATIVE 12/15/2016 1736   PROTEINUR >=300 (A) 12/15/2016 1736   UROBILINOGEN  0.2 09/08/2007 1345   NITRITE NEGATIVE 12/15/2016 1736   LEUKOCYTESUR NEGATIVE 12/15/2016 1736     Radiology Studies: Reviewed images personally in health database    Scheduled Meds: . alteplase  4 mg Intracatheter Once  . amLODipine  10 mg Oral Daily  . atorvastatin  20 mg Oral q1800  . calcitRIOL  2 mcg Oral Q M,W,F  . calcium acetate  1,334 mg Oral TID WC  . cinacalcet  120 mg Oral Once per day on Mon Wed Fri  . folic acid  1 mg Oral Daily  . heparin  5,000 Units Subcutaneous Q8H  . heparin  8,600 Units Dialysis Once in dialysis  . insulin aspart  0-15 Units Subcutaneous TID AC & HS  . metoprolol succinate  12.5 mg Oral Q T,Th,S,Su  . pantoprazole  40 mg Oral Daily  . tamsulosin  0.4 mg Oral QPC supper   Continuous Infusions: . sodium chloride    . sodium chloride       LOS: 9 days    Time spent: [place], MD [facility]   If 7PM-7AM, please contact night-coverage [email or web address] Password [facility] 01/29/2017, 3:49 PM

## 2017-01-29 NOTE — Progress Notes (Signed)
Occupational Therapy Evaluation Patient Details Name: [name] MRN: [id number] DOB: [birth year] Today's Date: 01/29/2017    History of Present Illness 66yo male who was found on floor at home by daughter after missing dialysis, also recently admitted with L fibular fracture. Diagnosed with hyperkalemia, acute metaboilc encephalopathy, SIRS/Sepsis, ESRD, HTN emergency. PMH L fibular fracture NWB status, CKD, COPD, DM, HTN, THA, AV fistula, total shoulder R    Clinical Impression   PTA, pt lived alone and was independent with ADL and mobility and drove himself to dialysis. Discussed with nsg - need to get clarification orders on WBS for LLE in order to help determine DC plan. If pt is able to ambulate using a Cam Boot, he would be appropriate for DC home with initial 24/7 S. Pt currently required Min a for mobility and ADL @ RW level with min vc to maintain NWB status. Pt asking about use of knee walker if he is NWB. Will further assess after WBS is determined.     Follow Up Recommendations  Home health OT;Supervision/Assistance - 24 hour (initially - daughter states this can be arranged)   Equipment Recommendations  3 in 1 bedside commode    Recommendations for Other Services Other (comment)(ortho/clarification on WBS)     Precautions / Restrictions Precautions Precautions: Fall;Other (comment) Precaution Comments: unsure of WBS Required Braces or Orthoses: Other Brace/Splint Other Brace/Splint: cast/wrap L LE  Restrictions Weight Bearing Restrictions: Yes LLE Weight Bearing: asked for clarification 12/21 Other Position/Activity Restrictions: kept NWB during session     Mobility Bed Mobility Overal bed mobility: Modified Independent    Transfers Overall transfer level: Needs assistance Equipment used: Rolling walker (2 wheeled) Transfers: Sit to/from [facility] Sit to Stand: Min assist Stand pivot transfers: Min assist           Balance Overall  balance assessment: Needs assistance;History of Falls Sitting-balance support: No upper extremity supported Sitting balance-Leahy Scale: Good     Standing balance support: Bilateral upper extremity supported;During functional activity Standing balance-Leahy Scale: Fair Standing balance comment: reliant on B UE support                            ADL either performed or assessed with clinical judgement   ADL Overall ADL's : Needs assistance/impaired     Grooming: Set up;Sitting   Upper Body Bathing: Set up;Sitting   Lower Body Bathing: Sit to/from stand;Minimal assistance Lower Body Bathing Details (indicate cue type and reason): attempted to keep pt NWB Upper Body Dressing : Set up;Sitting   Lower Body Dressing: Minimal assistance;Sit to/from stand   Toilet Transfer: Minimal assistance;RW;BSC;Ambulation Armed forces technical officer Details (indicate cue type and reason): vc for NWB         Functional mobility during ADLs: Minimal assistance;Rolling walker;Cueing for safety       Vision         Perception     Praxis      Pertinent Vitals/Pain Pain Assessment: No/denies pain Faces Pain Scale: Hurts little more     Hand Dominance Right   Extremity/Trunk Assessment Upper Extremity Assessment Upper Extremity Assessment: Overall WFL for tasks assessed   Lower Extremity Assessment Lower Extremity Assessment: Defer to PT evaluation;LLE deficits/detail(cast; fibular fx)   Cervical / Trunk Assessment Cervical / Trunk Assessment: Normal   Communication Communication Communication: No difficulties   Cognition Arousal/Alertness: Awake/alert Behavior During Therapy: WFL for tasks assessed/performed Overall Cognitive Status: Within Functional Limits for tasks  assessed                                 General Comments: daughter reports her father is close to baseline   General Comments       Exercises     Shoulder Instructions      Home Living  Family/patient expects to be discharged to:: Private residence Living Arrangements: Alone Available Help at Discharge: Family;Available 24 hours/day Type of Home: House Home Access: Level entry     Home Layout: (unable to obtain complete PLOF history due to urgency of attempting BM )     Bathroom Shower/Tub: Tub/shower unit;Curtain   Technical brewer Accessibility: Yes How Accessible: Accessible via walker Home Equipment: None   Additional Comments: PLOF information taken from prior chart review       Prior Functioning/Environment Level of Independence: Independent        Comments: drove himself to dialysis        OT Problem List: Impaired balance (sitting and/or standing);Decreased safety awareness;Decreased knowledge of use of DME or AE      OT Treatment/Interventions: Self-care/ADL training;DME and/or AE instruction;Therapeutic activities;Patient/family education;Balance training    OT Goals(Current goals can be found in the care plan section) Acute Rehab OT Goals Patient Stated Goal: to go home  OT Goal Formulation: With patient/family Time For Goal Achievement: 02/12/17 Potential to Achieve Goals: Good  OT Frequency: Min 2X/week   Barriers to D/C:            Co-evaluation              AM-PAC PT "6 Clicks" Daily Activity     Outcome Measure Help from another person eating meals?: None Help from another person taking care of personal grooming?: None Help from another person toileting, which includes using toliet, bedpan, or urinal?: A Little Help from another person bathing (including washing, rinsing, drying)?: A Little Help from another person to put on and taking off regular upper body clothing?: None Help from another person to put on and taking off regular lower body clothing?: A Little 6 Click Score: 21   End of Session Equipment Utilized During Treatment: Rolling walker Nurse Communication: Mobility status;Other  (comment);Weight bearing status(clarification on WBS)  Activity Tolerance: Patient tolerated treatment well Patient left: in bed;with call bell/phone within reach;with family/visitor present  OT Visit Diagnosis: Other abnormalities of gait and mobility (R26.89)                Time: 1191-4782 OT Time Calculation (min): 24 min Charges:  OT General Charges $OT Visit: 1 Visit OT Evaluation $OT Eval Moderate Complexity: 1 Mod OT Treatments $Self Care/Home Management : 8-22 mins G-Codes:     [facility], OT/L  [phone] 01/29/2017  Giabella Duhart,HILLARY 01/29/2017, 4:55 PM

## 2017-01-29 NOTE — Progress Notes (Signed)
Subjective: Neurology has been re-consulted for possible parasomnia or REM sleep-behavior disorder. As a review, the patient's PMHx includes ESRD on HD, HTN, DM, HLD and prostate CA s/p prostatectomy. He had a fall on 12/11 with fractures and sent home on pain medication with orthopedics follow-up. His daughter received a call 12/12 as patient was acutely altered after missing dialysis; ED workup revealed K 6.8, BUN/creatinine 86/14, anion gap 17. CT head was negative.  Somnolence was noted on 12/18 in the afternoon. The patient was difficult to arouse. Additional history revealed that he snores at night and "sleeps hard". It was thought that he may have OSA, a parasomnia or REM sleep behavior disorder. Plan is for outpatient sleep study and Neurology has been re-consulted.   On 12/20 at 9:30 AM nursing staff noted him to be confused on awakening: The patient at that time stated "I woke up confused dreaming about moving furniture". Pt had pulled IV out and condem cath off.  On 12/20 at 3:29 PM nursing staff noted the following: "Pt woke up from sleep confused and combative. CNA called out for help. Entered room and CNA stated "pt was swinging at me as I was trying to keep him in the bed. Pt confused and oriented only to self. After some time awake pt oriented to place and year and person." About 40 minutes later, a second spell of AMS was noted by nursing: "While in room pt talking about random things to self looking out window pointing at things making conversation to self. Pt seems to be confused."   The patient informs Neurologist that he snores when asleep but is not aware of any combative behavior while asleep. He says sometimes his muscles feel tired on awakening, as though he has been using them in his sleep. He denies any history of visual hallucinations. Also denies shuffling gait, gait unsteadiness, stiffness or resting tremor.  Objective: Current vital signs: BP (!) 169/84 (BP Location: Left  Arm)   Pulse 80   Temp 98.6 F (37 C) (Oral)   Resp (!) 23   Ht 5' 8"  (1.727 m)   Wt 88.8 kg (195 lb 12.3 oz)   SpO2 98%   BMI 29.77 kg/m  Vital signs in last 24 hours: Temp:  [97.7 F (36.5 C)-99.4 F (37.4 C)] 98.6 F (37 C) (12/21 0426) Pulse Rate:  [71-89] 80 (12/21 0426) Resp:  [18-26] 23 (12/21 0426) BP: (144-169)/(76-97) 169/84 (12/21 0426) SpO2:  [96 %-100 %] 98 % (12/21 0426)  Intake/Output from previous day: 12/20 0701 - 12/21 0700 In: 840 [P.O.:840] Out: 625 [Urine:625] Intake/Output this shift: No intake/output data recorded. Nutritional status: Diet Carb Modified Fluid consistency: Thin; Room service appropriate? Yes  Neurologic Exam:  Ment: Drowsy. Speech fluent with intact comprehension. Decreased attention. Able to answer questions regarding his symptoms and follow all commands.  CN: EOMI with saccadic visual pursuits noted. Face symmetric. Motor: 5/5 x 4 without asymmetry Cerebellar: No ataxia with FNF bilaterally.   Lab Results: Results for orders placed or performed during the hospital encounter of 01/20/17 (from the past 48 hour(s))  Glucose, capillary     Status: Abnormal   Collection Time: 01/27/17  8:22 AM  Result Value Ref Range   Glucose-Capillary 160 (H) 65 - 99 mg/dL  Renal function panel     Status: Abnormal   Collection Time: 01/27/17  9:17 AM  Result Value Ref Range   Sodium 132 (L) 135 - 145 mmol/L   Potassium 3.8 3.5 - 5.1  mmol/L   Chloride 94 (L) 101 - 111 mmol/L   CO2 26 22 - 32 mmol/L   Glucose, Bld 187 (H) 65 - 99 mg/dL   BUN 38 (H) 6 - 20 mg/dL   Creatinine, Ser 9.25 (H) 0.61 - 1.24 mg/dL   Calcium 8.3 (L) 8.9 - 10.3 mg/dL   Phosphorus 7.4 (H) 2.5 - 4.6 mg/dL   Albumin 2.9 (L) 3.5 - 5.0 g/dL   GFR calc non Af Amer 5 (L) >60 mL/min   GFR calc Af Amer 6 (L) >60 mL/min    Comment: (NOTE) The eGFR has been calculated using the CKD EPI equation. This calculation has not been validated in all clinical situations. eGFR's  persistently <60 mL/min signify possible Chronic Kidney Disease.    Anion gap 12 5 - 15  Glucose, capillary     Status: Abnormal   Collection Time: 01/27/17  4:54 PM  Result Value Ref Range   Glucose-Capillary 216 (H) 65 - 99 mg/dL  Glucose, capillary     Status: Abnormal   Collection Time: 01/27/17  9:18 PM  Result Value Ref Range   Glucose-Capillary 107 (H) 65 - 99 mg/dL  CBC with Differential/Platelet     Status: Abnormal   Collection Time: 01/28/17  5:32 AM  Result Value Ref Range   WBC 12.2 (H) 4.0 - 10.5 K/uL   RBC 2.95 (L) 4.22 - 5.81 MIL/uL   Hemoglobin 10.0 (L) 13.0 - 17.0 g/dL   HCT 30.7 (L) 39.0 - 52.0 %   MCV 104.1 (H) 78.0 - 100.0 fL   MCH 33.9 26.0 - 34.0 pg   MCHC 32.6 30.0 - 36.0 g/dL   RDW 14.1 11.5 - 15.5 %   Platelets 247 150 - 400 K/uL   Neutrophils Relative % 69 %   Neutro Abs 8.4 (H) 1.7 - 7.7 K/uL   Lymphocytes Relative 16 %   Lymphs Abs 2.0 0.7 - 4.0 K/uL   Monocytes Relative 12 %   Monocytes Absolute 1.5 (H) 0.1 - 1.0 K/uL   Eosinophils Relative 3 %   Eosinophils Absolute 0.4 0.0 - 0.7 K/uL   Basophils Relative 0 %   Basophils Absolute 0.0 0.0 - 0.1 K/uL  Glucose, capillary     Status: Abnormal   Collection Time: 01/28/17  6:35 AM  Result Value Ref Range   Glucose-Capillary 114 (H) 65 - 99 mg/dL  Glucose, capillary     Status: Abnormal   Collection Time: 01/28/17 11:43 AM  Result Value Ref Range   Glucose-Capillary 116 (H) 65 - 99 mg/dL   Comment 1 Notify RN   Glucose, capillary     Status: Abnormal   Collection Time: 01/28/17  4:10 PM  Result Value Ref Range   Glucose-Capillary 107 (H) 65 - 99 mg/dL   Comment 1 Notify RN    Comment 2 Document in Chart   Blood gas, arterial     Status: Abnormal   Collection Time: 01/28/17  4:30 PM  Result Value Ref Range   O2 Content 2.0 L/min   Delivery systems NASAL CANNULA    pH, Arterial 7.366 7.350 - 7.450   pCO2 arterial 48.5 (H) 32.0 - 48.0 mmHg   pO2, Arterial 105 83.0 - 108.0 mmHg    Bicarbonate 26.9 20.0 - 28.0 mmol/L   Acid-Base Excess 2.2 (H) 0.0 - 2.0 mmol/L   O2 Saturation 97.6 %   Patient temperature 99.4    Collection site LEFT RADIAL    Drawn by [id number]  Sample type ARTERIAL DRAW    Allens test (pass/fail) PASS PASS  Glucose, capillary     Status: Abnormal   Collection Time: 01/28/17  9:42 PM  Result Value Ref Range   Glucose-Capillary 110 (H) 65 - 99 mg/dL  CBC with Differential/Platelet     Status: Abnormal   Collection Time: 01/29/17  3:14 AM  Result Value Ref Range   WBC 11.3 (H) 4.0 - 10.5 K/uL   RBC 2.98 (L) 4.22 - 5.81 MIL/uL   Hemoglobin 10.6 (L) 13.0 - 17.0 g/dL   HCT 31.1 (L) 39.0 - 52.0 %   MCV 104.4 (H) 78.0 - 100.0 fL   MCH 35.6 (H) 26.0 - 34.0 pg   MCHC 34.1 30.0 - 36.0 g/dL   RDW 14.3 11.5 - 15.5 %   Platelets 272 150 - 400 K/uL   Neutrophils Relative % 69 %   Neutro Abs 7.8 (H) 1.7 - 7.7 K/uL   Lymphocytes Relative 19 %   Lymphs Abs 2.1 0.7 - 4.0 K/uL   Monocytes Relative 8 %   Monocytes Absolute 0.9 0.1 - 1.0 K/uL   Eosinophils Relative 4 %   Eosinophils Absolute 0.4 0.0 - 0.7 K/uL   Basophils Relative 0 %   Basophils Absolute 0.0 0.0 - 0.1 K/uL  Basic metabolic panel     Status: Abnormal   Collection Time: 01/29/17  3:14 AM  Result Value Ref Range   Sodium 133 (L) 135 - 145 mmol/L   Potassium 4.6 3.5 - 5.1 mmol/L    Comment: SLIGHT HEMOLYSIS   Chloride 98 (L) 101 - 111 mmol/L   CO2 22 22 - 32 mmol/L   Glucose, Bld 90 65 - 99 mg/dL   BUN 37 (H) 6 - 20 mg/dL   Creatinine, Ser 8.66 (H) 0.61 - 1.24 mg/dL   Calcium 8.9 8.9 - 10.3 mg/dL   GFR calc non Af Amer 6 (L) >60 mL/min   GFR calc Af Amer 6 (L) >60 mL/min    Comment: (NOTE) The eGFR has been calculated using the CKD EPI equation. This calculation has not been validated in all clinical situations. eGFR's persistently <60 mL/min signify possible Chronic Kidney Disease.    Anion gap 13 5 - 15  Renal function panel     Status: Abnormal   Collection Time: 01/29/17   3:14 AM  Result Value Ref Range   Sodium 135 135 - 145 mmol/L   Potassium 4.4 3.5 - 5.1 mmol/L   Chloride 99 (L) 101 - 111 mmol/L   CO2 24 22 - 32 mmol/L   Glucose, Bld 97 65 - 99 mg/dL   BUN 34 (H) 6 - 20 mg/dL   Creatinine, Ser 8.86 (H) 0.61 - 1.24 mg/dL   Calcium 9.1 8.9 - 10.3 mg/dL   Phosphorus 5.9 (H) 2.5 - 4.6 mg/dL   Albumin 3.5 3.5 - 5.0 g/dL   GFR calc non Af Amer 5 (L) >60 mL/min   GFR calc Af Amer 6 (L) >60 mL/min    Comment: (NOTE) The eGFR has been calculated using the CKD EPI equation. This calculation has not been validated in all clinical situations. eGFR's persistently <60 mL/min signify possible Chronic Kidney Disease.    Anion gap 12 5 - 15  Glucose, capillary     Status: None   Collection Time: 01/29/17  5:57 AM  Result Value Ref Range   Glucose-Capillary 91 65 - 99 mg/dL    Recent Results (from the past 240 hour(s))  Culture,  blood (x 2)     Status: None   Collection Time: 01/21/17  3:25 AM  Result Value Ref Range Status   Specimen Description BLOOD LEFT WRIST  Final   Special Requests   Final    BOTTLES DRAWN AEROBIC AND ANAEROBIC Blood Culture adequate volume   Culture NO GROWTH 5 DAYS  Final   Report Status 01/26/2017 FINAL  Final  Culture, blood (x 2)     Status: None   Collection Time: 01/21/17 10:10 AM  Result Value Ref Range Status   Specimen Description BLOOD LEFT ARM  Final   Special Requests   Final    BOTTLES DRAWN AEROBIC AND ANAEROBIC Blood Culture results may not be optimal due to an excessive volume of blood received in culture bottles   Culture NO GROWTH 5 DAYS  Final   Report Status 01/26/2017 FINAL  Final  MRSA PCR Screening     Status: None   Collection Time: 01/21/17  4:24 PM  Result Value Ref Range Status   MRSA by PCR NEGATIVE NEGATIVE Final    Comment:        The GeneXpert MRSA Assay (FDA approved for NASAL specimens only), is one component of a comprehensive MRSA colonization surveillance program. It is not intended  to diagnose MRSA infection nor to guide or monitor treatment for MRSA infections.     Lipid Panel No results for input(s): CHOL, TRIG, HDL, CHOLHDL, VLDL, LDLCALC in the last 72 hours.  Studies/Results: No results found.  Medications:  Scheduled: . alteplase  4 mg Intracatheter Once  . amLODipine  10 mg Oral Daily  . atorvastatin  20 mg Oral q1800  . calcitRIOL  2 mcg Oral Q M,W,F  . calcium acetate  1,334 mg Oral TID WC  . cinacalcet  120 mg Oral Once per day on Mon Wed Fri  . folic acid  1 mg Oral Daily  . heparin  5,000 Units Subcutaneous Q8H  . heparin  8,600 Units Dialysis Once in dialysis  . insulin aspart  0-15 Units Subcutaneous TID AC & HS  . metoprolol succinate  12.5 mg Oral Q T,Th,S,Su  . pantoprazole  40 mg Oral Daily  . tamsulosin  0.4 mg Oral QPC supper   Continuous: . sodium chloride    . sodium chloride     MSX:JDBZMC chloride, sodium chloride, acetaminophen **OR** acetaminophen, alteplase, heparin, hydrALAZINE, ipratropium-albuterol, lidocaine (PF), lidocaine-prilocaine, ondansetron **OR** ondansetron (ZOFRAN) IV, pentafluoroprop-tetrafluoroeth  Assessment/Plan: 1. 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 Symptoms described by patient and documented by nursing overlap the two conditions.  2. Will need a comprehensive sleep study in a sleep lab including EEG to assess possible parasomnia versus RBD.     LOS: 9 days   @Electronically  signed: Dr. Kerney Elbe 01/29/2017  7:24 AM

## 2017-01-29 NOTE — Progress Notes (Signed)
Physical Therapy Treatment Patient Details Name: [name] MRN: [id number] DOB: [birth year] Today's Date: 01/29/2017    History of Present Illness 66yo male who was found on floor at home by daughter after missing dialysis, also recently admitted with L fibular fracture. Diagnosed with hyperkalemia, acute metaboilc encephalopathy, SIRS/Sepsis, ESRD, HTN emergency. PMH L fibular fracture NWB status, CKD, COPD, DM, HTN, THA, AV fistula, total shoulder R     PT Comments    Pt able to do use a swing to gait pattern and not weight bear on the L LE, but needed demonstration and repetitive cuing throughout the activity.  Follow Up Recommendations  CIR     Equipment Recommendations  None recommended by PT    Recommendations for Other Services Rehab consult     Precautions / Restrictions Precautions Precautions: Fall;Other (comment) Precaution Comments: NWB L LE  Required Braces or Orthoses: Other Brace/Splint Other Brace/Splint: cast/wrap L LE  Restrictions Weight Bearing Restrictions: Yes LLE Weight Bearing: Non weight bearing Other Position/Activity Restrictions: assume NWB L LE  per attending MD     Mobility  Bed Mobility Overal bed mobility: Needs Assistance Bed Mobility: Supine to Sit     Supine to sit: Supervision Sit to supine: Supervision      Transfers Overall transfer level: Needs assistance Equipment used: Rolling walker (2 wheeled) Transfers: Sit to/from [facility] Sit to Stand: Min assist Stand pivot transfers: Min assist       General transfer comment: Initially pt did not understand how to maintain NWB during a transition to stand.. Pt needed demonstration and repetitive cuing to maintain NWB L LE  Ambulation/Gait Ambulation/Gait assistance: Min guard;Min assist Ambulation Distance (Feet): 25 Feet Assistive device: Rolling walker (2 wheeled) Gait Pattern/deviations: Step-to pattern;Decreased stride length   Gait velocity  interpretation: Below normal speed for age/gender General Gait Details: pt was able to do "swing to " gait with RW after demo, but easily lost focus, beginning the put weight down.  He could start swing to again once he regained focus.   Stairs            Wheelchair Mobility    Modified Rankin (Stroke Patients Only)       Balance Overall balance assessment: Needs assistance;History of Falls Sitting-balance support: No upper extremity supported Sitting balance-Leahy Scale: Good     Standing balance support: Bilateral upper extremity supported;During functional activity Standing balance-Leahy Scale: Fair Standing balance comment: reliant on B UE support                             Cognition Arousal/Alertness: Awake/alert Behavior During Therapy: WFL for tasks assessed/performed Overall Cognitive Status: No family/caregiver present to determine baseline cognitive functioning(pt able to follow demo/repetitve VC to maintain NWB)                                        Exercises      General Comments        Pertinent Vitals/Pain Pain Assessment: Faces Faces Pain Scale: Hurts a little bit Pain Location: L LEG Pain Descriptors / Indicators: Discomfort Pain Intervention(s): Monitored during session    Home Living Family/patient expects to be discharged to:: Private residence Living Arrangements: Alone Available Help at Discharge: Family Type of Home: House Home Access: Level entry   Home Layout: (unable to obtain complete PLOF history  due to urgency of attempting BM ) Home Equipment: None Additional Comments: PLOF information taken from prior chart review     Prior Function Level of Independence: Independent          PT Goals (current goals can now be found in the care plan section) Acute Rehab PT Goals Patient Stated Goal: to go home  PT Goal Formulation: With patient Time For Goal Achievement: 02/10/17 Potential to Achieve  Goals: Good Progress towards PT goals: Progressing toward goals    Frequency    Min 5X/week      PT Plan Current plan remains appropriate    Co-evaluation              AM-PAC PT "6 Clicks" Daily Activity  Outcome Measure  Difficulty turning over in bed (including adjusting bedclothes, sheets and blankets)?: None Difficulty moving from lying on back to sitting on the side of the bed? : None Difficulty sitting down on and standing up from a chair with arms (e.g., wheelchair, bedside commode, etc,.)?: A Little Help needed moving to and from a bed to chair (including a wheelchair)?: A Little Help needed walking in hospital room?: A Lot Help needed climbing 3-5 steps with a railing? : Total 6 Click Score: 17    End of Session   Activity Tolerance: Patient tolerated treatment well Patient left: in chair;with chair alarm set Nurse Communication: Mobility status;Weight bearing status PT Visit Diagnosis: Other abnormalities of gait and mobility (R26.89);Unsteadiness on feet (R26.81)     Time: 7902-4097 PT Time Calculation (min) (ACUTE ONLY): 13 min  Charges:  $Gait Training: 8-22 mins                    G Codes:       23-Feb-2017  Donnella Sham, PT [phone]  (pager)   Tessie Fass Magdala Brahmbhatt February 23, 2017, 4:45 PM

## 2017-01-29 NOTE — Care Management Important Message (Signed)
Important Message  Patient Details  Name: [name] MRN: [id number] Date of Birth: 1951/01/16   Medicare Important Message Given:  Yes    Nathen May 01/29/2017, 9:58 AM

## 2017-01-29 NOTE — Clinical Social Work Note (Signed)
Clinical Social Work Assessment  Patient Details  Name: [name] MRN: [id number] Date of Birth: Mar 21, 1950  Date of referral:  01/29/17               Reason for consult:  Facility Placement(backup for CIR)                Permission sought to share information with:    Permission granted to share information::     Name::        Agency::     Relationship::     Contact Information:     Housing/Transportation Living arrangements for the past 2 months:  Single Family Home Source of Information:  Patient Patient Interpreter Needed:  None Criminal Activity/Legal Involvement Pertinent to Current Situation/Hospitalization:  No - Comment as needed Significant Relationships:  Adult Children Lives with:  Self Do you feel safe going back to the place where you live?  Yes Need for family participation in patient care:  No (Coment)  Care giving concerns: Patient from home alone. Patient reports he slipped and fell in the snow and hurt his foot. PT recommending CIR. CSW assessing for SNF backup.   Social Worker assessment / plan: CSW met with patient at bedside. Patient alert and oriented. CSW discussed recommendation for CIR. Patient declined any rehab, stating he didn't need it and his foot is ok. Patient stated he does not believe his foot is broken and is not having any pain in his foot. Patient indicated his daughter will be moving in with him. CSW left voicemail message for daughter, awaiting call back. On chart review CSW noted patient's missed HD treatment and subsequent complications. CSW to follow for disposition planning, pending appropriateness for CIR and patient and family's wishes.  Employment status:  Retired Forensic scientist:  Medicare PT Recommendations:  Inpatient [facility] / Referral to community resources:  [facility](backup for [facility])  Patient/Family's Response to care: Patient appreciative of care.  Patient/Family's Understanding of and  Emotional Response to Diagnosis, Current Treatment, and Prognosis: Patient did not discuss missed HD treatment and also stated he does not believe his foot is broken.  Emotional Assessment Appearance:  Appears stated age Attitude/Demeanor/Rapport:  Other(appropriate) Affect (typically observed):  Calm Orientation:  Oriented to Self, Oriented to Place, Oriented to  Time, Oriented to Situation Alcohol / Substance use:  Not Applicable Psych involvement (Current and /or in the community):  No (Comment)  Discharge Needs  Concerns to be addressed:  Discharge Planning Concerns Readmission within the last 30 days:  Yes Current discharge risk:  Physical Impairment Barriers to Discharge:  Continued Medical Work up   Estanislado Emms, LCSW 01/29/2017, 12:53 PM

## 2017-01-30 DIAGNOSIS — S82831A Other fracture of upper and lower end of right fibula, initial encounter for closed fracture: Secondary | ICD-10-CM

## 2017-01-30 DIAGNOSIS — E1122 Type 2 diabetes mellitus with diabetic chronic kidney disease: Secondary | ICD-10-CM

## 2017-01-30 DIAGNOSIS — N183 Chronic kidney disease, stage 3 (moderate): Secondary | ICD-10-CM

## 2017-01-30 DIAGNOSIS — E875 Hyperkalemia: Secondary | ICD-10-CM

## 2017-01-30 DIAGNOSIS — J449 Chronic obstructive pulmonary disease, unspecified: Secondary | ICD-10-CM

## 2017-01-30 DIAGNOSIS — S82831S Other fracture of upper and lower end of right fibula, sequela: Secondary | ICD-10-CM

## 2017-01-30 DIAGNOSIS — N186 End stage renal disease: Secondary | ICD-10-CM

## 2017-01-30 DIAGNOSIS — I10 Essential (primary) hypertension: Secondary | ICD-10-CM

## 2017-01-30 DIAGNOSIS — Z992 Dependence on renal dialysis: Secondary | ICD-10-CM

## 2017-01-30 DIAGNOSIS — D631 Anemia in chronic kidney disease: Secondary | ICD-10-CM

## 2017-01-30 LAB — GLUCOSE, CAPILLARY
GLUCOSE-CAPILLARY: 130 mg/dL — AB (ref 65–99)
GLUCOSE-CAPILLARY: 110 mg/dL — AB (ref 65–99)
Glucose-Capillary: 123 mg/dL — ABNORMAL HIGH (ref 65–99)
Glucose-Capillary: 120 mg/dL — ABNORMAL HIGH (ref 65–99)

## 2017-01-30 NOTE — Progress Notes (Signed)
Admit: 01/20/2017 LOS: 10  58M ESRD [facility] MWF with AMS, dysphagia  Subjective:  HD yesterday: post weight 86.9kg kg, 3L UF; tolerated well; fullt reatment CIR vs SNF vs HH being determined Neuro consult for sleep issues yesterda, reviewed Pt w/o complaint this AM; has sitter  12/21 0701 - 12/22 0700 In: 240 [P.O.:240] Out: 3080 [Urine:80]  Filed Weights   01/29/17 0700 01/29/17 1100 01/30/17 0454  Weight: 89.8 kg (197 lb 15.6 oz) 86.9 kg (191 lb 9.3 oz) 87.5 kg (192 lb 14.4 oz)    Scheduled Meds: . alteplase  4 mg Intracatheter Once  . amLODipine  10 mg Oral Daily  . atorvastatin  20 mg Oral q1800  . calcitRIOL  2 mcg Oral Q M,W,F  . calcium acetate  1,334 mg Oral TID WC  . cinacalcet  120 mg Oral Once per day on Mon Wed Fri  . folic acid  1 mg Oral Daily  . heparin  5,000 Units Subcutaneous Q8H  . heparin  8,600 Units Dialysis Once in dialysis  . insulin aspart  0-15 Units Subcutaneous TID AC & HS  . metoprolol succinate  12.5 mg Oral Q T,Th,S,Su  . pantoprazole  40 mg Oral Daily  . tamsulosin  0.4 mg Oral QPC supper   Continuous Infusions: . sodium chloride    . sodium chloride     PRN Meds:.sodium chloride, sodium chloride, acetaminophen **OR** acetaminophen, alteplase, heparin, hydrALAZINE, ipratropium-albuterol, lidocaine (PF), lidocaine-prilocaine, ondansetron **OR** ondansetron (ZOFRAN) IV, pentafluoroprop-tetrafluoroeth  Current Labs: reviewed    Physical Exam:  Blood pressure (!) 158/131, pulse 87, temperature 98.5 F (36.9 C), temperature source Oral, resp. rate 20, height 5\' 8"  (1.727 m), weight 87.5 kg (192 lb 14.4 oz), SpO2 100 %. RRR RUE AVF +B/T CTAB No [place] LLE wrapped NAD< pleasant  Dialysis Orders: MWF [facility] 4hr 39min RIJ cath (rt BCF revised by Dr. Donzetta Matters + PTA by Dr. Scot Dock) 2/2.5 EDW 95.5kg Heparin 8600 bolus     Calcitriol 56mcg PO TIW           Last Mircera 50 given on 11/28          Sensipar 120mg  PO TIW   A 1. ESRD MWF [facility] 2. AMS seems  improved 3. Recent L fibular leg fracture 4. AMS 5. Anemia of CKD; Hb stable 6. CKD BMD on C3, Sensipar, CaAcetate 7. DM 8. COPD 9. HTN on amlodpine; bp stable; lowering EDW 10. Dysphagia  P 1. Cont MWF schedule; under EDW probe for lower post weights; 2K 2.5 Ca; 400/800 using TDC; bolus heparin 2. NExt HD is tomorrow 12/23, holiday schedule   Pearson Grippe MD 01/30/2017, 7:32 AM  Recent Labs  Lab 01/26/17 0432 01/26/17 2150 01/27/17 0917 01/29/17 0314  NA 134* 133* 132* 135  133*  K 3.9 3.9 3.8 4.4  4.6  CL 96* 96* 94* 99*  98*  CO2 28 27 26 24  22   GLUCOSE 133* 129* 187* 97  90  BUN 25* 35* 38* 34*  37*  CREATININE 6.43* 8.43* 9.25* 8.86*  8.66*  CALCIUM 8.4* 8.6* 8.3* 9.1  8.9  PHOS 6.0*  --  7.4* 5.9*   Recent Labs  Lab 01/27/17 0237 01/28/17 0532 01/29/17 0314  WBC 8.5 12.2* 11.3*  NEUTROABS 5.4 8.4* 7.8*  HGB 10.1* 10.0* 10.6*  HCT 29.7* 30.7* 31.1*  MCV 103.1* 104.1* 104.4*  PLT 227 247 272

## 2017-01-30 NOTE — Progress Notes (Signed)
Orthopedic Tech Progress Note Patient Details:  [name] [birth year] [id number]  Ortho Devices Type of Ortho Device: CAM walker Ortho Device/Splint Location: lle Ortho Device/Splint Interventions: Application   Post Interventions Patient Tolerated: Well Instructions Provided: Care of device   [name] 01/30/2017, 8:58 AM

## 2017-01-30 NOTE — Progress Notes (Signed)
Patient continues to be confused and keep removing the dressing on the LL leg. Not compliance with safety sitter or nurse.

## 2017-01-30 NOTE — Progress Notes (Signed)
Pt found in bathroom by Nurse smoking. Cigarette was confiscated and given to daughter but pt refused to give lighter. Pt was educated on policy. Charge nurse made aware. Will continue to monitor. Isac Caddy, RN

## 2017-01-30 NOTE — Progress Notes (Signed)
Patient refused BiPap for tonight.  Advised patient to have RT called should he change his mind.

## 2017-01-30 NOTE — Progress Notes (Signed)
PROGRESS NOTE    [name]  YTK:[id number] DOB: [birth year] DOA: 01/20/2017 PCP: [facility] [place]   Specialists:     Brief Narrative:  66 year old male ESRD on HD MWF HTN DM TY 2 HLD prostate Ca s/p prostatectomy, ventral hernia repair fall 12/11 fractured distal and left fibula sent home on pain medication follow-up with orthopedics Daughter received a call 12/12 as patient acutely altered missed dialysis-came to emergency room-workup revealed K 6.8 BUN/creatinine 86/14 anion gap 17 CXR negative CT head negative EKG T wave peak Neurology consult and nephrology consulted Patient inporved and felt to have parasomnias and undiagnosed OSA leading to confusion around sleep-wakefullness  Assessment & Plan:   Principal Problem:   Hyperkalemia Active Problems:   Diabetes mellitus ([place])   End stage renal disease on dialysis ([place])   SIRS (systemic inflammatory response syndrome) ([place])   Hypertensive urgency   Closed fracture of distal end of left fibula   Anemia due to chronic kidney disease   Acute metabolic encephalopathy   Abnormal swallowing   Altered mental status   Toxic metabolic encephalopathy 2/2 to meds and Uremia-possible undiag OSA and parasomnias MRI neg, BCx2 negative pro-calcitonin neg, ammonia negative-EEG generalized irregular recurrent metabolic encephalopathy 75/17?  Sleepiness 12/18 pm-had some parasomnias 12/20 am need Sleep study and OP management for this   ? OSA/Parasomnia  somnolence on 12/18 pm and was difficult to arouse as OP does need formal OSA testing split somnogram-referral placed to CM for management--arranging machine and RN did confirm this with CM 12/21  Will need CPAP settings of 12/6 written on d/c for machine  ?  Sepsis  cultures negative on empiric Zosyn treating for 7 days since stopping 12/20-white count trend 15-->9.0-->12---vancomycin d/c 12/16 and has resolved without recurrent concerns for sepsis  Hiccups Trial of  Thorazine 25 IM x 1 Resolved completely  Dysphagia Speech is seen and recommended dysphagia 3 diet Await re-assessment for SLP in terms of graduation of diet  ESRD MWF Hyperkalemia-resolved with treatment calcium gluconate dextrose etc. currently 3.8 Anemia of renal disease Secondary hyperparathyroidism Defer to nephrology Continue calcitriol 2 mcg MWF, Sensipar 120  Hypertensive urgency on admission and still not controlled 208/97 now better lisinopril has been held Continue amlodipine 10, hydralazine 10 every 4 as needed blood pressure >180--Adding toprol xl 25 t-th-s-s BP are still 150 range but seem to be better than prior Adjust going forward as OP  Closed fracture distal end of left fibula secondary to fall Continue Ace wrap splint and needs follow-up with Dr. Percell Miller of orthopedics as an outpatient nwb presumed and OP therapy  Hyperlipidemia Continue Lipitor 20  Will probably need SNF?  CIR?  OT consulted 12/20 for eval No family present currently Await dispo  Macrocytosis-mild leukocytosis Continue b12 If persists will consider work-up with hematology as OP  med surg status--no need tele Refusing SNF and might need HH--will need DME CPAP Asking CIR to see although not sure best candidate for the same in terms of long term post CIR case and planning  Consultants:   Nephrology  Procedures:   None  Antimicrobials:   Zosyn   Subjective:  Less confused more awake alert pleasan tin nad eatign well No cp nv sob  Objective: Vitals:   01/30/17 0800 01/30/17 0900 01/30/17 0901 01/30/17 1223  BP: (!) 142/74 (!) 142/74    Pulse: 80 78 87 80  Resp: (!) 22 19    Temp:    97.6 F (36.4 C)  TempSrc:  Oral  SpO2: 98% 100%  96%  Weight:      Height:        Intake/Output Summary (Last 24 hours) at 01/30/2017 1611 Last data filed at 01/30/2017 1230 Gross per 24 hour  Intake 480 ml  Output 80 ml  Net 400 ml   Filed Weights   01/29/17 0700 01/29/17  1100 01/30/17 0454  Weight: 89.8 kg (197 lb 15.6 oz) 86.9 kg (191 lb 9.3 oz) 87.5 kg (192 lb 14.4 oz)    Examination:  alert pleasant in nad Chest clear no added sound no rales no rhonchi abd soft nt nd no rebound no guard Moves 4 limbs equally without deficit LLE wrapped  Data Reviewed: I have personally reviewed following labs and imaging studies  CBC: Recent Labs  Lab 01/25/17 0306 01/26/17 0432 01/27/17 0237 01/28/17 0532 01/29/17 0314  WBC 11.0* 9.0 8.5 12.2* 11.3*  NEUTROABS 8.0* 6.6 5.4 8.4* 7.8*  HGB 11.2* 10.8* 10.1* 10.0* 10.6*  HCT 33.8* 33.2* 29.7* 30.7* 31.1*  MCV 105.0* 104.7* 103.1* 104.1* 104.4*  PLT 243 225 227 247 254   Basic Metabolic Panel: Recent Labs  Lab 01/25/17 0306 01/26/17 0432 01/26/17 2150 01/27/17 0917 01/29/17 0314  NA 135 134* 133* 132* 135  133*  K 4.3 3.9 3.9 3.8 4.4  4.6  CL 97* 96* 96* 94* 99*  98*  CO2 25 28 27 26 24  22   GLUCOSE 140* 133* 129* 187* 97  90  BUN 48* 25* 35* 38* 34*  37*  CREATININE 8.85* 6.43* 8.43* 9.25* 8.86*  8.66*  CALCIUM 8.8* 8.4* 8.6* 8.3* 9.1  8.9  PHOS  --  6.0*  --  7.4* 5.9*   GFR: Estimated Creatinine Clearance: 8.8 mL/min (A) (by C-G formula based on SCr of 8.66 mg/dL (H)). Liver Function Tests: Recent Labs  Lab 01/26/17 2150 01/27/17 0917 01/29/17 0314  AST 37  --   --   ALT 46  --   --   ALKPHOS 34*  --   --   BILITOT 0.5  --   --   PROT 6.0*  --   --   ALBUMIN 3.2* 2.9* 3.5   No results for input(s): LIPASE, AMYLASE in the last 168 hours. Recent Labs  Lab 01/24/17 0905  AMMONIA 52*   Coagulation Profile: No results for input(s): INR, PROTIME in the last 168 hours. Cardiac Enzymes: No results for input(s): CKTOTAL, CKMB, CKMBINDEX, TROPONINI in the last 168 hours. BNP (last 3 results) No results for input(s): PROBNP in the last 8760 hours. HbA1C: No results for input(s): HGBA1C in the last 72 hours. CBG: Recent Labs  Lab 01/29/17 1200 01/29/17 1627 01/29/17 2110  01/30/17 0559 01/30/17 1140  GLUCAP 81 167* 116* 123* 120*   Lipid Profile: No results for input(s): CHOL, HDL, LDLCALC, TRIG, CHOLHDL, LDLDIRECT in the last 72 hours. Thyroid Function Tests: No results for input(s): TSH, T4TOTAL, FREET4, T3FREE, THYROIDAB in the last 72 hours. Anemia Panel: No results for input(s): VITAMINB12, FOLATE, FERRITIN, TIBC, IRON, RETICCTPCT in the last 72 hours. Urine analysis:    Component Value Date/Time   COLORURINE YELLOW 12/15/2016 1736   APPEARANCEUR CLEAR 12/15/2016 1736   LABSPEC 1.011 12/15/2016 1736   PHURINE 7.0 12/15/2016 1736   GLUCOSEU 50 (A) 12/15/2016 1736   HGBUR NEGATIVE 12/15/2016 1736   BILIRUBINUR NEGATIVE 12/15/2016 1736   KETONESUR NEGATIVE 12/15/2016 1736   PROTEINUR >=300 (A) 12/15/2016 1736   UROBILINOGEN 0.2 09/08/2007 1345   NITRITE NEGATIVE  12/15/2016 1736   [place] 12/15/2016 1736     Radiology Studies: Reviewed images personally in health database    Scheduled Meds: . alteplase  4 mg Intracatheter Once  . amLODipine  10 mg Oral Daily  . atorvastatin  20 mg Oral q1800  . calcitRIOL  2 mcg Oral Q M,W,F  . calcium acetate  1,334 mg Oral TID WC  . cinacalcet  120 mg Oral Once per day on Mon Wed Fri  . folic acid  1 mg Oral Daily  . heparin  5,000 Units Subcutaneous Q8H  . heparin  8,600 Units Dialysis Once in dialysis  . insulin aspart  0-15 Units Subcutaneous TID AC & HS  . metoprolol succinate  12.5 mg Oral Q T,Th,S,Su  . pantoprazole  40 mg Oral Daily  . tamsulosin  0.4 mg Oral QPC supper   Continuous Infusions: . sodium chloride    . sodium chloride       LOS: 10 days    Time spent: [place], MD [facility]   If 7PM-7AM, please contact night-coverage [email or web address] Password [email or web address] 01/30/2017, 4:11 PM
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
## 2017-01-30 NOTE — Progress Notes (Signed)
Physical Therapy Treatment Patient Details Name: [name] MRN: [id number] DOB: December 14, 1950 Today's Date: 01/30/2017    History of Present Illness Pt is a 66 yo male who was found on floor at home by daughter after missing dialysis, also recently admitted with L fibular fracture. Diagnosed with hyperkalemia, acute metaboilc encephalopathy, SIRS/Sepsis, ESRD, HTN emergency. PMH L fibular fracture NWB status, CKD, COPD, DM, HTN, THA, AV fistula, total shoulder R     PT Comments    Pt seen for mobility progression and continues to be limited secondary to fatigue. Pt with safety sitter present; however, he was alert, oriented and following commands appropriately. Although he does need constant verbal cueing to maintain NWB L LE. Pt would continue to benefit from skilled physical therapy services at this time while admitted and after d/c to address the below listed limitations in order to improve overall safety and independence with functional mobility.   Follow Up Recommendations  CIR     Equipment Recommendations  None recommended by PT    Recommendations for Other Services Rehab consult     Precautions / Restrictions Precautions Precautions: Fall;Other (comment) Required Braces or Orthoses: Other Brace/Splint Other Brace/Splint: cast/wrap L LE  Restrictions Weight Bearing Restrictions: Yes LLE Weight Bearing: Non weight bearing Other Position/Activity Restrictions: assumed NWB L LE    Mobility  Bed Mobility Overal bed mobility: Modified Independent                Transfers Overall transfer level: Needs assistance Equipment used: Rolling walker (2 wheeled) Transfers: Sit to/from Stand Sit to Stand: Min assist         General transfer comment: increased time and effort, good technique, pt unable to maintain L LE off of the floor during transition, min A to power into standing from EOB  Ambulation/Gait Ambulation/Gait assistance: Min guard Ambulation Distance  (Feet): 25 Feet Assistive device: Rolling walker (2 wheeled) Gait Pattern/deviations: Step-to pattern;Decreased stride length(hop-to on R LE) Gait velocity: decreased Gait velocity interpretation: Below normal speed for age/gender General Gait Details: pt required frequent verbal cueing and demonstration to maintain NWB L LE with gait; pt was able to perform a hop-to gait on R LE with RW with min guard and constant cueing   Stairs            Wheelchair Mobility    Modified Rankin (Stroke Patients Only)       Balance Overall balance assessment: Needs assistance;History of Falls Sitting-balance support: No upper extremity supported Sitting balance-Leahy Scale: Good     Standing balance support: Bilateral upper extremity supported;During functional activity Standing balance-Leahy Scale: Poor Standing balance comment: reliant on bilateral UEs on RW                            Cognition Arousal/Alertness: Awake/alert Behavior During Therapy: WFL for tasks assessed/performed Overall Cognitive Status: Within Functional Limits for tasks assessed                                        Exercises      General Comments        Pertinent Vitals/Pain Pain Assessment: No/denies pain    Home Living                      Prior Function  PT Goals (current goals can now be found in the care plan section) Acute Rehab PT Goals PT Goal Formulation: With patient Time For Goal Achievement: 02/10/17 Potential to Achieve Goals: Good Progress towards PT goals: Progressing toward goals    Frequency    Min 5X/week      PT Plan Current plan remains appropriate    Co-evaluation              AM-PAC PT "6 Clicks" Daily Activity  Outcome Measure  Difficulty turning over in bed (including adjusting bedclothes, sheets and blankets)?: None Difficulty moving from lying on back to sitting on the side of the bed? :  None Difficulty sitting down on and standing up from a chair with arms (e.g., wheelchair, bedside commode, etc,.)?: Unable Help needed moving to and from a bed to chair (including a wheelchair)?: A Little Help needed walking in hospital room?: A Little Help needed climbing 3-5 steps with a railing? : A Lot 6 Click Score: 17    End of Session Equipment Utilized During Treatment: Gait belt Activity Tolerance: Patient limited by fatigue Patient left: in chair;with call bell/phone within reach;with chair alarm set;with nursing/sitter in room Nurse Communication: Mobility status PT Visit Diagnosis: Other abnormalities of gait and mobility (R26.89);Unsteadiness on feet (R26.81)     Time: 3893-7342 PT Time Calculation (min) (ACUTE ONLY): 10 min  Charges:  $Gait Training: 8-22 mins                    G Codes:       [place], [place], [place] 01/30/2017, 10:48 AM

## 2017-01-30 NOTE — Progress Notes (Signed)
Rehab Admissions Coordinator Note:  Patient was screened by Cleatrice Burke for appropriateness for an Inpatient Acute Rehab Consult per PT recommendation. .  At this time, we are recommending Inpatient Rehab consult. Please place order if you agree.   Cleatrice Burke 01/30/2017, 1:15 PM  I can be reached at [phone].

## 2017-01-30 NOTE — Progress Notes (Signed)
Occupational Therapy Treatment Patient Details Name: [name] MRN: [id number] DOB: 05-10-1950 Today's Date: 01/30/2017    History of present illness Pt is a 66 yo male who was found on floor at home by daughter after missing dialysis, also recently admitted with L fibular fracture. Diagnosed with hyperkalemia, acute metaboilc encephalopathy, SIRS/Sepsis, ESRD, HTN emergency. PMH L fibular fracture NWB status, CKD, COPD, DM, HTN, THA, AV fistula, total shoulder R    OT comments  Pt demonstrates improved ability to maintain NWB, but continues to struggle as he fatigues.  He requires min A for ADLs, and is at risk for falls.  He has poor safety awareness.   Feel he would benefit from short CIR stay to allow him to maximize his safety and independence.  Will follow.   Follow Up Recommendations  CIR;Supervision/Assistance - 24 hour    Equipment Recommendations  3 in 1 bedside commode    Recommendations for Other Services      Precautions / Restrictions Precautions Precautions: Fall;Other (comment) Precaution Comments: Per initial ED note, pt is to be NWB and follow up with ortho in office  Required Braces or Orthoses: Other Brace/Splint Other Brace/Splint: Short leg splint/CAM boot  Restrictions Weight Bearing Restrictions: Yes LLE Weight Bearing: Non weight bearing Other Position/Activity Restrictions: Per initial ED note, pt to be NWB and follow up with ortho in office        Mobility Bed Mobility Overal bed mobility: Modified Independent                Transfers Overall transfer level: Needs assistance Equipment used: Rolling walker (2 wheeled) Transfers: Sit to/from [facility] Sit to Stand: Min assist Stand pivot transfers: Min assist       General transfer comment: Min A to steady     Balance Overall balance assessment: Needs assistance;History of Falls Sitting-balance support: No upper extremity supported Sitting balance-Leahy Scale:  Good     Standing balance support: Bilateral upper extremity supported;During functional activity Standing balance-Leahy Scale: Poor Standing balance comment: reliant on bilateral UEs on RW                           ADL either performed or assessed with clinical judgement   ADL Overall ADL's : Needs assistance/impaired                     Lower Body Dressing: Minimal assistance;Sit to/from stand Lower Body Dressing Details (indicate cue type and reason): assist to steady  Toilet Transfer: Minimal assistance;RW;BSC;Ambulation Toilet Transfer Details (indicate cue type and reason): verbal cues for NWB.  As he fatigues, he demonstrates difficulty maintaining  Toileting- Clothing Manipulation and Hygiene: Minimal assistance;Sit to/from stand       Functional mobility during ADLs: Minimal assistance;Rolling walker;Cueing for safety General ADL Comments: Pt requires assist to steady, espeically as he fatigues      Vision       Perception     Praxis      Cognition Arousal/Alertness: Awake/alert Behavior During Therapy: WFL for tasks assessed/performed Overall Cognitive Status: No family/caregiver present to determine baseline cognitive functioning                                 General Comments: Pt demonstrates decreased safety awareness.          Exercises     Shoulder Instructions  General Comments Pt demonstrates difficulty maintaining NWB as he fatigues.      Pertinent Vitals/ Pain       Pain Assessment: No/denies pain  Home Living                                          Prior Functioning/Environment              Frequency  Min 2X/week        Progress Toward Goals  OT Goals(current goals can now be found in the care plan section)  Progress towards OT goals: Progressing toward goals     Plan Discharge plan needs to be updated    Co-evaluation                 AM-PAC PT "6  Clicks" Daily Activity     Outcome Measure   Help from another person eating meals?: None Help from another person taking care of personal grooming?: A Little Help from another person toileting, which includes using toliet, bedpan, or urinal?: A Little Help from another person bathing (including washing, rinsing, drying)?: A Little Help from another person to put on and taking off regular upper body clothing?: A Little Help from another person to put on and taking off regular lower body clothing?: A Little 6 Click Score: 19    End of Session Equipment Utilized During Treatment: Rolling walker  OT Visit Diagnosis: Unsteadiness on feet (R26.81)   Activity Tolerance Patient tolerated treatment well   Patient Left in bed;with call bell/phone within reach;with bed alarm set   Nurse Communication Mobility status        Time: 1346-1406 OT Time Calculation (min): 20 min  Charges: OT General Charges $OT Visit: 1 Visit OT Treatments $Self Care/Home Management : 8-22 mins  [facility], OTR/L [phone]    Lucille Passy M 01/30/2017, 7:10 PM

## 2017-01-31 DIAGNOSIS — S82832G Other fracture of upper and lower end of left fibula, subsequent encounter for closed fracture with delayed healing: Secondary | ICD-10-CM

## 2017-01-31 LAB — CBC
HEMATOCRIT: 29 % — AB (ref 39.0–52.0)
HEMOGLOBIN: 9.8 g/dL — AB (ref 13.0–17.0)
MCH: 34.8 pg — ABNORMAL HIGH (ref 26.0–34.0)
MCHC: 33.8 g/dL (ref 30.0–36.0)
MCV: 102.8 fL — AB (ref 78.0–100.0)
Platelets: 274 10*3/uL (ref 150–400)
RBC: 2.82 MIL/uL — ABNORMAL LOW (ref 4.22–5.81)
RDW: 13.9 % (ref 11.5–15.5)
WBC: 9.7 10*3/uL (ref 4.0–10.5)

## 2017-01-31 LAB — GLUCOSE, CAPILLARY
GLUCOSE-CAPILLARY: 107 mg/dL — AB (ref 65–99)
GLUCOSE-CAPILLARY: 111 mg/dL — AB (ref 65–99)
GLUCOSE-CAPILLARY: 110 mg/dL — AB (ref 65–99)
Glucose-Capillary: 146 mg/dL — ABNORMAL HIGH (ref 65–99)

## 2017-01-31 LAB — RENAL FUNCTION PANEL
ALBUMIN: 3.3 g/dL — AB (ref 3.5–5.0)
ANION GAP: 13 (ref 5–15)
BUN: 46 mg/dL — ABNORMAL HIGH (ref 6–20)
CHLORIDE: 93 mmol/L — AB (ref 101–111)
CO2: 25 mmol/L (ref 22–32)
Calcium: 8.8 mg/dL — ABNORMAL LOW (ref 8.9–10.3)
Creatinine, Ser: 8.86 mg/dL — ABNORMAL HIGH (ref 0.61–1.24)
GFR calc Af Amer: 6 mL/min — ABNORMAL LOW (ref >60)
GFR, EST NON AFRICAN AMERICAN: 5 mL/min — AB (ref >60)
Glucose, Bld: 104 mg/dL — ABNORMAL HIGH (ref 65–99)
PHOSPHORUS: 5 mg/dL — AB (ref 2.5–4.6)
POTASSIUM: 4 mmol/L (ref 3.5–5.1)
Sodium: 131 mmol/L — ABNORMAL LOW (ref 135–145)

## 2017-01-31 MED ORDER — CALCITRIOL 0.5 MCG PO CAPS
ORAL_CAPSULE | ORAL | Status: AC
  Filled 2017-01-31: qty 4

## 2017-01-31 NOTE — Procedures (Signed)
I was present at this dialysis session. I have reviewed the session itself and made appropriate changes.   Disposition being determined.  CIR vs SNF/Home.  2K bath TDC UF 3L Qb 400.  Next HD 12/26.    Filed Weights   01/29/17 1100 01/30/17 0454 01/31/17 0734  Weight: 86.9 kg (191 lb 9.3 oz) 87.5 kg (192 lb 14.4 oz) 90.5 kg (199 lb 8.3 oz)    Recent Labs  Lab 01/29/17 0314  NA 135  133*  K 4.4  4.6  CL 99*  98*  CO2 24  22  GLUCOSE 97  90  BUN 34*  37*  CREATININE 8.86*  8.66*  CALCIUM 9.1  8.9  PHOS 5.9*    Recent Labs  Lab 01/27/17 0237 01/28/17 0532 01/29/17 0314  WBC 8.5 12.2* 11.3*  NEUTROABS 5.4 8.4* 7.8*  HGB 10.1* 10.0* 10.6*  HCT 29.7* 30.7* 31.1*  MCV 103.1* 104.1* 104.4*  PLT 227 247 272    Scheduled Meds: . alteplase  4 mg Intracatheter Once  . amLODipine  10 mg Oral Daily  . atorvastatin  20 mg Oral q1800  . calcitRIOL  2 mcg Oral Q M,W,F  . calcium acetate  1,334 mg Oral TID WC  . cinacalcet  120 mg Oral Once per day on Mon Wed Fri  . folic acid  1 mg Oral Daily  . heparin  5,000 Units Subcutaneous Q8H  . heparin  8,600 Units Dialysis Once in dialysis  . insulin aspart  0-15 Units Subcutaneous TID AC & HS  . metoprolol succinate  12.5 mg Oral Q T,Th,S,Su  . pantoprazole  40 mg Oral Daily  . tamsulosin  0.4 mg Oral QPC supper   Continuous Infusions: . sodium chloride    . sodium chloride     PRN Meds:.sodium chloride, sodium chloride, acetaminophen **OR** acetaminophen, alteplase, heparin, hydrALAZINE, ipratropium-albuterol, lidocaine (PF), lidocaine-prilocaine, ondansetron **OR** ondansetron (ZOFRAN) IV, pentafluoroprop-tetrafluoroeth   Pearson Grippe  MD 01/31/2017, 8:40 AM

## 2017-01-31 NOTE — Progress Notes (Signed)
Occupational Therapy Treatment Patient Details Name: [name] MRN: [id number] DOB: 13-Apr-1950 Today's Date: 01/31/2017    History of present illness Pt is a 66 yo male who was found on floor at home by daughter after missing dialysis, also recently admitted with L fibular fracture. Diagnosed with hyperkalemia, acute metaboilc encephalopathy, SIRS/Sepsis, ESRD, HTN emergency. PMH L fibular fracture NWB status, CKD, COPD, DM, HTN, THA, AV fistula, total shoulder R    OT comments  This 66 yo male admitted with above presents to acute OT with session today focusing on trying knee walker for first time. Pt currently needs cues and A for safety with knee walker and has difficulty with [place] with tranfers. Will continue to benefit from acute OT with follow up OT on CIR.  Follow Up Recommendations  CIR;Supervision/Assistance - 24 hour    Equipment Recommendations  3 in 1 bedside commode       Precautions / Restrictions Precautions Precautions: Fall Required Braces or Orthoses: Other Brace/Splint Other Brace/Splint: Short leg splint/CAM boot  Restrictions Weight Bearing Restrictions: Yes LLE Weight Bearing: Non weight bearing Other Position/Activity Restrictions: Per initial ED note, pt to be NWB and follow up with ortho in office        Mobility Bed Mobility Overal bed mobility: Modified Independent                Transfers Overall transfer level: Needs assistance Equipment used: Ambulation equipment used(knee walker) Transfers: Sit to/from Stand;Stand Pivot Transfers Sit to Stand: Min assist Stand pivot transfers: Min assist       General transfer comment: to<>from knee walker pt Min A; transition from bed to knee walker (pt orginally placed knee walker to his right side after I had already showed him a transfer with it positioned it front of him parallel). I had to explain to him that if he wanted to try the knee walker in front of him vertically he needed to place it  over towards his right foot since he needed to stand up  on his RLE only.  Upon coming back from hallway he positoned knee walker coming off on his left side (when asked how he was going to do this withotu putting weight through LLE he replied it won't matter). Asked him to get LLE back on knee walker and turn knee walker around to come off other side towards bed, he go perpendiular to bed and just plopped off of knee walker backwards--very unsafe. Pt mobilized with knee walker 150 feet with min A (pt with difficulty with turns)    Balance Overall balance assessment: Needs assistance;History of Falls Sitting-balance support: No upper extremity supported;Feet supported Sitting balance-Leahy Scale: Normal     Standing balance support: Bilateral upper extremity supported   Standing balance comment: reliant on bilateral UEs on knee walker                           ADL either performed or assessed with clinical judgement   ADL Overall ADL's : Needs assistance/impaired                         Toilet Transfer: Minimal assistance Toilet Transfer Details (indicate cue type and reason): Knee walker; bed<>knee walker                 Vision Patient Visual Report: Central vision impairment            Cognition  Arousal/Alertness: Awake/alert Behavior During Therapy: WFL for tasks assessed/performed Overall Cognitive Status: No family/caregiver present to determine baseline cognitive functioning                                 General Comments: Pt demonstrates decreased safety awareness.  He is told to not  put weight through LLE with transfers and he says OK, but then does anyway. Decreased problem solving with coming off of Knee walker without putting weight through LLE              General Comments Pts bandage around LLE was all in disarray when I entered, I removed the ace wraps and re-wrapped them.    Pertinent Vitals/ Pain       Pain  Assessment: 0-10 Pain Score: 5  Pain Location: left above ankle Pain Descriptors / Indicators: Aching Pain Intervention(s): Limited activity within patient's tolerance;Monitored during session;Repositioned         Frequency  Min 2X/week        Progress Toward Goals  OT Goals(current goals can now be found in the care plan section)  Progress towards OT goals: Not progressing toward goals - comment(remains at min A level for transfers due to [place] status LLE)     Plan Discharge plan remains appropriate       AM-PAC PT "6 Clicks" Daily Activity     Outcome Measure   Help from another person eating meals?: None Help from another person taking care of personal grooming?: A Little Help from another person toileting, which includes using toliet, bedpan, or urinal?: A Little Help from another person bathing (including washing, rinsing, drying)?: A Little Help from another person to put on and taking off regular upper body clothing?: A Little Help from another person to put on and taking off regular lower body clothing?: A Little 6 Click Score: 19    End of Session Equipment Utilized During Treatment: Gait belt;Other (comment)(knee walker)  OT Visit Diagnosis: Unsteadiness on feet (R26.81);Pain Pain - Right/Left: Left Pain - part of body: Leg   Activity Tolerance Patient tolerated treatment well   Patient Left in bed;with call bell/phone within reach;with bed alarm set   Nurse Communication Patient requests pain meds        Time: 2876-8115 OT Time Calculation (min): 50 min  Charges: OT General Charges $OT Visit: 1 Visit OT Treatments $Self Care/Home Management : 38-52 mins  [address], OTR/L [phone] 01/31/2017

## 2017-01-31 NOTE — Progress Notes (Signed)
PROGRESS NOTE    [name]  VVO:[id number] DOB: 02/23/1950 DOA: 01/20/2017 PCP: [facility]   Specialists:     Brief Narrative:  66 year old male ESRD on HD MWF HTN DM TY 2 HLD prostate Ca s/p prostatectomy, ventral hernia repair fall 12/11 fractured distal and left fibula sent home on pain medication follow-up with orthopedics Daughter received a call 12/12 as patient acutely altered missed dialysis-came to emergency room-workup revealed K 6.8 BUN/creatinine 86/14 anion gap 17 CXR negative CT head negative EKG T wave peak Neurology and nephrology consulted Patient inproved and felt to have parasomnias and undiagnosed OSA leading to confusion around sleep-wakefullness  Assessment & Plan:   Principal Problem:   Hyperkalemia Active Problems:   Diabetes mellitus ([place])   End stage renal disease on dialysis ([place])   SIRS (systemic inflammatory response syndrome) ([place])   Hypertensive urgency   Closed fracture of distal end of left fibula   Anemia due to chronic kidney disease   Acute metabolic encephalopathy   Abnormal swallowing   Altered mental status   Chronic obstructive pulmonary disease ([place])   Closed fracture of right distal fibula   Toxic metabolic encephalopathy 2/2 to meds and Uremia-possible undiag OSA and parasomnias - MRI neg, BCx2 negative, pro-calcitonin neg, ammonia negative, EEG generalized irregular - recurrent metabolic encephalopathy 26/94?  Sleepiness 12/18 pm-had some parasomnias 12/20 am - need Sleep study and OP management for this - Coherent and appropriate today. Confirmed by RN as well. Improved/? Resolved.  ? OSA/Parasomnia - somnolence on 12/18 pm and was difficult to arouse as OP. - does need formal OSA testing split somnogram-referral placed to CM for management--arranging machine and RN did confirm this with CM 12/21  - Will need CPAP settings of 12/6 written on d/c for machine - Patient apparently refused to wear CPAP overnight  12/22. Encouraged to use same.  ?  Sepsis - cultures negative on empiric Zosyn treating for 7 days since stopping 12/20-white count trend 15-->9.0-->12---vancomycin d/c 12/16 and has resolved without recurrent concerns for sepsis  Hiccups Trial of Thorazine 25 IM x 1 Resolved completely  Dysphagia Speech has seen and recommended dysphagia 3 diet Await re-assessment for SLP in terms of graduation of diet  ESRD MWF Hyperkalemia-resolved with treatment calcium gluconate dextrose and with HD  Anemia of renal disease Stable.  Secondary hyperparathyroidism Defer to nephrology Continue calcitriol 2 mcg MWF, Sensipar 120  Hypertensive urgency on admission  208/97 now better lisinopril has been held Continue amlodipine 10, hydralazine 10 every 4 as needed blood pressure >180--Added toprol xl 12.5 t-th-s-s Blood pressure is better, mildly uncontrolled and fluctuating at times. Volume management across dialysis. Monitor and adjust medications as needed.  Closed fracture distal end of left fibula secondary to fall Continue Ace wrap splint and needs follow-up with Dr. Percell Miller of orthopedics as an outpatient nwb presumed and OP therapy Patient was supposed to follow-up with orthopedics as outpatient on 01/22/17 but hospitalized here on 12/12. Will request orthopedics in a.m. to follow up and make needed recommendations.  Hyperlipidemia Continue Lipitor 20  Macrocytosis-mild leukocytosis Continue b12 If persists will consider work-up with hematology as OP  Type II DM with renal complications - Well controlled in the hospital.  DVT prophylaxis: Heparin CODE STATUS: Full Family communication: None at bedside. Disposition: Pending. Rehabilitation M.D. has seen patient on 12/22 and they would like to follow-up again on 12/24 with family and with therapies. He could potentially be a CIR candidate. Patient has refused SNF  prior. Will need CPAP at discharge.  Consultants:    Nephrology  Rehabilitation M.D.  Procedures:   HD  Antimicrobials:   Zosyn-completed course   Subjective:  Seen this morning at dialysis. Reports some pain in his left leg. As per RN, refused to wear CPAP overnight but has remained coherent without confusion this morning. No other complaints reported.  Objective: Vitals:   01/31/17 1030 01/31/17 1145 01/31/17 1225 01/31/17 1500  BP: (!) 161/101 (!) 148/77 (!) 152/88   Pulse: 96 89 (!) 103 (!) 123  Resp: 18 (!) 21 (!) 22 (!) 21  Temp:  98.3 F (36.8 C) 97.8 F (36.6 C)   TempSrc:  Oral Oral   SpO2:  97% 98% 100%  Weight:  88.7 kg (195 lb 8.8 oz)    Height:        Intake/Output Summary (Last 24 hours) at 01/31/2017 1724 Last data filed at 01/31/2017 1145 Gross per 24 hour  Intake 240 ml  Output 2780 ml  Net -2540 ml   Filed Weights   01/30/17 0454 01/31/17 0734 01/31/17 1145  Weight: 87.5 kg (192 lb 14.4 oz) 90.5 kg (199 lb 8.3 oz) 88.7 kg (195 lb 8.8 oz)    Examination:  Gen. exam: Pleasant middle-aged male, moderately built and nourished, lying comfortably in bed undergoing HD. Respiratory system: Clear to auscultation. No increased work of breathing. Cardiovascular system: S1 and S2 heard, RRR. No JVD, murmurs or pedal edema. Telemetry: Sinus rhythm. Abdominal exam: Nondistended, soft and nontender. Normal bowel sounds heard. CNS: Alert and oriented. No focal neurological deficits. Extremities: Left leg dressing/cast intact. Moves other 3 limbs without difficulty. Psychiatry: Mood appears slightly irritable.  Data Reviewed: I have personally reviewed following labs and imaging studies  CBC: Recent Labs  Lab 01/25/17 0306 01/26/17 0432 01/27/17 0237 01/28/17 0532 01/29/17 0314 01/31/17 0955  WBC 11.0* 9.0 8.5 12.2* 11.3* 9.7  NEUTROABS 8.0* 6.6 5.4 8.4* 7.8*  --   HGB 11.2* 10.8* 10.1* 10.0* 10.6* 9.8*  HCT 33.8* 33.2* 29.7* 30.7* 31.1* 29.0*  MCV 105.0* 104.7* 103.1* 104.1* 104.4* 102.8*  PLT  243 225 227 247 272 097   Basic Metabolic Panel: Recent Labs  Lab 01/26/17 0432 01/26/17 2150 01/27/17 0917 01/29/17 0314 01/31/17 0955  NA 134* 133* 132* 135  133* 131*  K 3.9 3.9 3.8 4.4  4.6 4.0  CL 96* 96* 94* 99*  98* 93*  CO2 28 27 26 24  22 25   GLUCOSE 133* 129* 187* 97  90 104*  BUN 25* 35* 38* 34*  37* 46*  CREATININE 6.43* 8.43* 9.25* 8.86*  8.66* 8.86*  CALCIUM 8.4* 8.6* 8.3* 9.1  8.9 8.8*  PHOS 6.0*  --  7.4* 5.9* 5.0*   GFR: Estimated Creatinine Clearance: 8.6 mL/min (A) (by C-G formula based on SCr of 8.86 mg/dL (H)). Liver Function Tests: Recent Labs  Lab 01/26/17 2150 01/27/17 0917 01/29/17 0314 01/31/17 0955  AST 37  --   --   --   ALT 46  --   --   --   ALKPHOS 34*  --   --   --   BILITOT 0.5  --   --   --   PROT 6.0*  --   --   --   ALBUMIN 3.2* 2.9* 3.5 3.3*   CBG: Recent Labs  Lab 01/30/17 1627 01/30/17 2139 01/31/17 0630 01/31/17 1234 01/31/17 1649  GLUCAP 130* 110* 107* 111* 110*   Urine analysis:    Component  Value Date/Time   COLORURINE YELLOW 12/15/2016 1736   APPEARANCEUR CLEAR 12/15/2016 1736   LABSPEC 1.011 12/15/2016 1736   PHURINE 7.0 12/15/2016 1736   GLUCOSEU 50 (A) 12/15/2016 1736   HGBUR NEGATIVE 12/15/2016 1736   BILIRUBINUR NEGATIVE 12/15/2016 1736   KETONESUR NEGATIVE 12/15/2016 1736   PROTEINUR >=300 (A) 12/15/2016 1736   UROBILINOGEN 0.2 09/08/2007 1345   NITRITE NEGATIVE 12/15/2016 1736   LEUKOCYTESUR NEGATIVE 12/15/2016 1736     Radiology Studies: Reviewed images personally in health database    Scheduled Meds: . alteplase  4 mg Intracatheter Once  . amLODipine  10 mg Oral Daily  . atorvastatin  20 mg Oral q1800  . calcitRIOL      . calcitRIOL  2 mcg Oral Q M,W,F  . calcium acetate  1,334 mg Oral TID WC  . cinacalcet  120 mg Oral Once per day on Mon Wed Fri  . folic acid  1 mg Oral Daily  . heparin  5,000 Units Subcutaneous Q8H  . heparin  8,600 Units Dialysis Once in dialysis  . insulin  aspart  0-15 Units Subcutaneous TID AC & HS  . metoprolol succinate  12.5 mg Oral Q T,Th,S,Su  . pantoprazole  40 mg Oral Daily  . tamsulosin  0.4 mg Oral QPC supper   Continuous Infusions: . sodium chloride    . sodium chloride       LOS: 11 days    Time spent: [place], MD, [place], [facility]. [facility] Pager [phone]  If 7PM-7AM, please contact night-coverage [email or web address] Password [email or web address] 01/31/2017, 5:36 PM

## 2017-02-01 ENCOUNTER — Inpatient Hospital Stay (HOSPITAL_COMMUNITY): Payer: Medicare Other

## 2017-02-01 DIAGNOSIS — S82892G Other fracture of left lower leg, subsequent encounter for closed fracture with delayed healing: Secondary | ICD-10-CM

## 2017-02-01 LAB — GLUCOSE, CAPILLARY
GLUCOSE-CAPILLARY: 107 mg/dL — AB (ref 65–99)
GLUCOSE-CAPILLARY: 156 mg/dL — AB (ref 65–99)
GLUCOSE-CAPILLARY: 104 mg/dL — AB (ref 65–99)
GLUCOSE-CAPILLARY: 110 mg/dL — AB (ref 65–99)

## 2017-02-01 NOTE — Progress Notes (Signed)
Occupational Therapy Treatment Patient Details Name: [name] MRN: [id number] DOB: 1950/12/11 Today's Date: 02/01/2017    History of present illness Pt is Paul 66 yo male who was found on floor at home by daughter after missing dialysis, also recently admitted with L fibular fracture. Diagnosed with hyperkalemia, acute metaboilc encephalopathy, SIRS/Sepsis, ESRD, HTN emergency. PMH L fibular fracture NWB status, CKD, COPD, DM, HTN, THA, AV fistula, total shoulder R    OT comments  Pt demonstrating progress toward OT goals demonstrating ability to complete stand-pivot toilet transfers to and from [facility] with min guard assist and RW this session. He continues to demonstrate poor safety awareness and is not able to maintain L LE NWB status throughout transfers. He will follow commands to improve adherence at times but is unable to follow through for the duration of the task. Continue to recommend CIR level therapies post-acute D/C. Will continue to follow.    Follow Up Recommendations  CIR;Supervision/Assistance - 24 hour    Equipment Recommendations  3 in 1 bedside commode    Recommendations for Other Services Other (comment)(clarification on weight bearing status)    Precautions / Restrictions Precautions Precautions: Fall Precaution Comments: Per initial ED note, pt is to be NWB and follow up with ortho in office  Required Braces or Orthoses: Other Brace/Splint Other Brace/Splint: Short leg splint/CAM boot order Restrictions Weight Bearing Restrictions: Yes LLE Weight Bearing: Non weight bearing Other Position/Activity Restrictions: Per initial ED note, pt to be NWB and follow up with ortho in office        Mobility Bed Mobility               General bed mobility comments: OOB in chair on my arrival.   Transfers Overall transfer level: Needs assistance Equipment used: Rolling walker (2 wheeled)(need clarification if knee walker safe for injury) Transfers: Sit to/from  Stand Sit to Stand: Min guard Stand pivot transfers: Min guard       General transfer comment: VC's for safe hand placement. Near constant cues to maintain L LE NWB. Pt received attempting to stand on his own and unable to retrieve knee walker prior to needing to assist with transfer to [facility]. Also would like to clarify if knee walker safe for injury.     Balance Overall balance assessment: Needs assistance;History of Falls Sitting-balance support: No upper extremity supported;Feet supported Sitting balance-Leahy Scale: Normal     Standing balance support: Bilateral upper extremity supported;Single extremity supported;During functional activity Standing balance-Leahy Scale: Poor Standing balance comment: Relies on at least single UE support                           ADL either performed or assessed with clinical judgement   ADL Overall ADL's : Needs assistance/impaired                         Toilet Transfer: Min Press photographer Details (indicate cue type and reason): Min guard assist for safety and VC's for NWB. Pt received attempting to stand on his own and unable to retrieve knee walker. Also need to clarify with MD if able to WB through knee Toileting- Clothing Manipulation and Hygiene: Min guard;Sit to/from stand Toileting - Clothing Manipulation Details (indicate cue type and reason): Constant cueing for L LE NWB     Functional mobility during ADLs: Minimal assistance;Rolling walker;Cueing for safety General ADL Comments: Pt requires assist to steady, espeically  as he fatigues      Manufacturing systems engineer      Cognition Arousal/Alertness: Awake/alert Behavior During Therapy: Flat affect;WFL for tasks assessed/performed Overall Cognitive Status: No family/caregiver present to determine baseline cognitive functioning                                 General Comments: Pt with very poor safety  awareness and argumenative when attempting to re-educate concerning L LE NWB. Decreased ability to follow commands or problem solve during toileting tasks.         Exercises General Exercises - Lower Extremity Long Arc Quad: AROM;Both;10 reps;Supine Hip Flexion/Marching: AROM;Both;10 reps;Seated   Shoulder Instructions       General Comments      Pertinent Vitals/ Pain       Pain Assessment: No/denies pain Pain Score: 6  Pain Location: left above ankle Pain Descriptors / Indicators: Aching Pain Intervention(s): Monitored during session  Home Living                                          Prior Functioning/Environment              Frequency  Min 2X/week        Progress Toward Goals  OT Goals(current goals can now be found in the care plan section)  Progress towards OT goals: Progressing toward goals  Acute Rehab OT Goals Patient Stated Goal: to go home  OT Goal Formulation: With patient/family Time For Goal Achievement: 02/12/17 Potential to Achieve Goals: Good  Plan Discharge plan remains appropriate    Co-evaluation                 AM-PAC PT "6 Clicks" Daily Activity     Outcome Measure   Help from another person eating meals?: Paul Little Help from another person taking care of personal grooming?: Paul Little Help from another person toileting, which includes using toliet, bedpan, or urinal?: Paul Little Help from another person bathing (including washing, rinsing, drying)?: Paul Little Help from another person to put on and taking off regular upper body clothing?: Paul Little Help from another person to put on and taking off regular lower body clothing?: Paul Little 6 Click Score: 18    End of Session    OT Visit Diagnosis: Unsteadiness on feet (R26.81);Pain Pain - Right/Left: Left Pain - part of body: Leg   Activity Tolerance     Patient Left     Nurse Communication          Time: 3086-5784 OT Time Calculation (min): 14  min  Charges: OT General Charges $OT Visit: 1 Visit OT Treatments $Self Care/Home Management : 8-22 mins  Norman Herrlich, MS OTR/L  Pager: [place] [name] 02/01/2017, 12:52 PM

## 2017-02-01 NOTE — Progress Notes (Signed)
Orthopedic Tech Progress Note Patient Details:  [name] June 06, 1950 [id number]  Casting Type of Cast: Short leg cast Cast Material: Fiberglass Cast Intervention: Application  Post Interventions Patient Tolerated: Well Instructions Provided: Care of device     [name] 02/01/2017, 1:27 PM

## 2017-02-01 NOTE — PMR Pre-admission (Signed)
PMR Admission Coordinator Pre-Admission Assessment  Patient: [name] is an 66 y.o., male MRN: [id number] DOB: [birth year] Height: 5\' 8"  (172.7 cm) Weight: 90.5 kg (199 lb 8.3 oz)              Insurance Information HMO:     PPO:      PCP:      IPA:      80/20:      OTHER:  PRIMARY: Medicare A & B      Policy#: [id number]      Subscriber: Self CM Name:       Phone#:      Fax#:  Pre-Cert#: Eligible       Employer: Retired  Benefits:  Phone #: Verified online      Name: Passport One Eff. Date: 11/10/07     Deduct: $1340      Out of Pocket Max: N/A      Life Max: N/A CIR: 100%      SNF: 100% days 1-20; 80% days 21-100 Outpatient: 80%     Co-Pay: 20% Home Health: 100%      Co-Pay: none DME: 80%     Co-Pay: 20% Providers: Patient's Choice   SECONDARY: BCBS State     Policy#: [id number]      Subscriber: Self CM Name:       Phone#:      Fax#:  Pre-Cert#:       Employer:  Benefits:  Phone #: [phone]     Name:  Eff. Date:      Deduct:       Out of Pocket Max:       Life Max:  CIR:       SNF:  Outpatient:      Co-Pay:  Home Health:       Co-Pay:  DME:      Co-Pay:   Medicaid Application Date:       Case Manager:  Disability Application Date:       Case Worker:   Emergency Contact Information Contact Information    Name Relation Home Work Mobile   [place],Monique Daughter [phone]       Current Medical History  Patient Admitting Diagnosis: Metabolic encephalopathy  History of Present Illness: [name] a 66 y.o.malewith past medical history of ESRD on HD, HTN, DM, HLD, COPD, GERD, tobacco abuse, recent left fibula fracture presented on 84/16 with metabolic encephalopathy. Per chart review and patient lives alone independent prior to admission. One level home.,the previous day he was noted to have a left fibular fracture after a fall. He was casted in discharged home with Tylenol. The next day, he did not present for his dialysis session. His daughter went to check on  him and found him unresponsive. Patient was brought to the ED  and noted to be tachycardic, tachypneic with hypertension, hyperkalemia history of present illness. Ammonia level within normal limits. CT/MRI of the head was performed, reviewed unremarkable for acute process. Neurology was consulted due to altered mental status. MRI was ordered due to concerns of PRES,but patient was not able to lie still initially. There was concern for sepsis and broad-spectrum antibiotics were started, but since have been DC'd. Hospital course further complicated by leukocytosis and anemia of chronic disease. Subcutaneous heparin for DVT prophylaxis. Hemodialysis ongoing as per renal services. Tolerating a regular consistency diet. Patient remains nonweightbearing left lower extremity Physical and occupational therapy evaluations completed with recommendations of physical medicine rehabilitation consult. Patient to be  admitted for a comprehensive inpatient rehabilitation program.  Past Medical History  Past Medical History:  Diagnosis Date  . Arthritis    right hand  . Cancer [facility])    prostate  . Chronic kidney disease    Dialysis M/w/F, [facility]  . COPD (chronic obstructive pulmonary disease) ([place])   . Diabetes mellitus    Type 2   . GERD (gastroesophageal reflux disease)   . High cholesterol   . Hypertension   . Shortness of breath dyspnea     Family History  family history includes CAD in his father; Heart failure in his mother.  Prior Rehab/Hospitalizations:  Has the patient had major surgery during 100 days prior to admission? Yes  Current Medications   Current Facility-Administered Medications:  .  0.9 %  sodium chloride infusion, 100 mL, Intravenous, PRN, Joelyn Oms, Ryan B, MD .  0.9 %  sodium chloride infusion, 100 mL, Intravenous, PRN, Joelyn Oms, Ryan B, MD .  0.9 %  sodium chloride infusion, 100 mL, Intravenous, PRN, Roney Jaffe, MD .  0.9 %  sodium chloride infusion, 100 mL,  Intravenous, PRN, Roney Jaffe, MD .  acetaminophen (TYLENOL) tablet 650 mg, 650 mg, Oral, Q6H PRN, 650 mg at 02/03/17 0721 **OR** acetaminophen (TYLENOL) suppository 650 mg, 650 mg, Rectal, Q6H PRN, Smith, Rondell A, MD .  alteplase (CATHFLO ACTIVASE) injection 2 mg, 2 mg, Intracatheter, Once PRN, Roney Jaffe, MD .  alteplase (CATHFLO ACTIVASE) injection 4 mg, 4 mg, Intracatheter, Once, Dwana Melena, MD .  amLODipine (NORVASC) tablet 10 mg, 10 mg, Oral, Daily, Alma Friendly, MD, 10 mg at 02/02/17 0630 .  atorvastatin (LIPITOR) tablet 20 mg, 20 mg, Oral, q1800, Alma Friendly, MD, 20 mg at 02/02/17 1830 .  calcitRIOL (ROCALTROL) capsule 2 mcg, 2 mcg, Oral, Q M,W,F, Dwana Melena, MD, 2 mcg at 01/31/17 [phone] .  calcium acetate (PHOSLO) capsule 1,334 mg, 1,334 mg, Oral, TID WC, Alma Friendly, MD, 1,334 mg at 02/02/17 1829 .  cinacalcet (SENSIPAR) tablet 120 mg, 120 mg, Oral, Once per day on Mon Wed Fri, Lin, James W, MD, 120 mg at 01/27/17 0932 .  folic acid (FOLVITE) tablet 1 mg, 1 mg, Oral, Daily, Alma Friendly, MD, 1 mg at 02/02/17 3557 .  heparin injection 1,000 Units, 1,000 Units, Dialysis, PRN, Roney Jaffe, MD .  Derrill Memo ON 02/04/2017] heparin injection 4,000 Units, 4,000 Units, Dialysis, PRN, Roney Jaffe, MD .  heparin injection 5,000 Units, 5,000 Units, Subcutaneous, Q8H, Fuller Plan A, MD, 5,000 Units at 02/03/17 3220 .  heparin injection 8,600 Units, 8,600 Units, Dialysis, Once in dialysis, Pearson Grippe B, MD .  heparin injection 8,600 Units, 8,600 Units, Dialysis, Once in dialysis, Pearson Grippe B, MD .  hydrALAZINE (APRESOLINE) injection 10 mg, 10 mg, Intravenous, Q4H PRN, Fuller Plan A, MD, 10 mg at 01/22/17 1054 .  insulin aspart (novoLOG) injection 0-15 Units, 0-15 Units, Subcutaneous, TID AC & HS, Alma Friendly, MD, 3 Units at 02/02/17 2313 .  ipratropium-albuterol (DUONEB) 0.5-2.5 (3) MG/3ML nebulizer solution 3 mL, 3 mL,  Nebulization, Q4H PRN, Smith, Rondell A, MD .  lidocaine (PF) (XYLOCAINE) 1 % injection 5 mL, 5 mL, Intradermal, PRN, Roney Jaffe, MD .  lidocaine-prilocaine (EMLA) cream 1 application, 1 application, Topical, PRN, Roney Jaffe, MD .  metoprolol succinate (TOPROL-XL) 24 hr tablet 12.5 mg, 12.5 mg, Oral, Q T,Th,S,Su, Samtani, Jai-Gurmukh, MD, 12.5 mg at 02/02/17 0939 .  ondansetron (ZOFRAN) tablet 4 mg, 4 mg, Oral, Q6H  PRN **OR** ondansetron (ZOFRAN) injection 4 mg, 4 mg, Intravenous, Q6H PRN, Fuller Plan A, MD, 4 mg at 01/25/17 1904 .  pantoprazole (PROTONIX) EC tablet 40 mg, 40 mg, Oral, Daily, Alma Friendly, MD, 40 mg at 02/02/17 5621 .  pentafluoroprop-tetrafluoroeth (GEBAUERS) aerosol 1 application, 1 application, Topical, PRN, Roney Jaffe, MD .  tamsulosin [facility]) capsule 0.4 mg, 0.4 mg, Oral, QPC supper, Alma Friendly, MD, 0.4 mg at 02/02/17 1830  Patients Current Diet: Diet Carb Modified Fluid consistency: Thin; Room service appropriate? Yes  Precautions / Restrictions Precautions Precautions: Fall Precaution Comments: Per initial ED note, pt is to be NWB and follow up with ortho in office  Other Brace/Splint: Short leg splint/CAM boot order Restrictions Weight Bearing Restrictions: Yes LLE Weight Bearing: Non weight bearing Other Position/Activity Restrictions: Per initial ED note, pt to be NWB and follow up with ortho in office    Has the patient had 2 or more falls or a fall with injury in the past year?Yes  Prior Activity Level Community (5-7x/wk): Prior to recent left fibula fracture paitent was fully independent and driving himslef to HD: M,W,F.  Home Assistive Devices / Equipment Home Assistive Devices/Equipment: None Home Equipment: None  Prior Device Use: Indicate devices/aids used by the patient prior to current illness, exacerbation or injury? None of the above  Prior Functional Level Prior Function Level of Independence:  Independent Comments: drove himself to dialysis  Self Care: Did the patient need help bathing, dressing, using the toilet or eating? Independent  Indoor Mobility: Did the patient need assistance with walking from room to room (with or without device)? Independent  Stairs: Did the patient need assistance with internal or external stairs (with or without device)? Independent  Functional Cognition: Did the patient need help planning regular tasks such as shopping or remembering to take medications? Independent  Current Functional Level Cognition  Overall Cognitive Status: No family/caregiver present to determine baseline cognitive functioning Orientation Level: Oriented X4 General Comments: Pt with very poor safety awareness and argumenative when attempting to re-educate concerning L LE NWB. Decreased ability to follow commands or problem solve during toileting tasks.     Extremity Assessment (includes Sensation/Coordination)  Upper Extremity Assessment: Overall WFL for tasks assessed  Lower Extremity Assessment: Defer to PT evaluation, LLE deficits/detail(cast; fibular fx)    ADLs  Overall ADL's : Needs assistance/impaired Grooming: Set up, Sitting Upper Body Bathing: Set up, Sitting Lower Body Bathing: Sit to/from stand, Minimal assistance Lower Body Bathing Details (indicate cue type and reason): attempted to keep pt NWB Upper Body Dressing : Set up, Sitting Lower Body Dressing: Minimal assistance, Sit to/from stand Lower Body Dressing Details (indicate cue type and reason): assist to steady  Toilet Transfer: Min guard, Stand-pivot, RW, BSC Toilet Transfer Details (indicate cue type and reason): Min guard assist for safety and VC's for NWB. Pt received attempting to stand on his own and unable to retrieve knee walker. Also need to clarify with MD if able to WB through knee Toileting- Clothing Manipulation and Hygiene: Min guard, Sit to/from stand Toileting - Clothing Manipulation  Details (indicate cue type and reason): Constant cueing for L LE NWB Functional mobility during ADLs: Minimal assistance, Rolling walker, Cueing for safety General ADL Comments: Pt requires assist to steady, espeically as he fatigues     Mobility  Overal bed mobility: Modified Independent Bed Mobility: Supine to Sit Supine to sit: Supervision Sit to supine: Min guard General bed mobility comments: OOB in chair on my  arrival.     Transfers  Overall transfer level: Needs assistance Equipment used: Rolling walker (2 wheeled)(need clarification if knee walker safe for injury) Transfers: Sit to/from Stand Sit to Stand: Min guard Stand pivot transfers: Min guard General transfer comment: VC's for safe hand placement. Near constant cues to maintain L LE NWB. Pt received attempting to stand on his own and unable to retrieve knee walker prior to needing to assist with transfer to [facility]. Also would like to clarify if knee walker safe for injury.     Ambulation / Gait / Stairs / Wheelchair Mobility  Ambulation/Gait Ambulation/Gait assistance: Museum/gallery curator (Feet): 30 Feet Assistive device: Rolling walker (2 wheeled) Gait Pattern/deviations: Step-to pattern, Decreased stride length(hop to) General Gait Details: During last 10 ft of Gait training patient began to demonstrate touch down weight bearing.  Pt required cues for sequencing and to keep RW close to improve safety with hop to pattern.  Pt remains with poor safety awareness and decreased awareness of deficits.   Gait velocity: decreased Gait velocity interpretation: Below normal speed for age/gender    Posture / Balance Balance Overall balance assessment: Needs assistance, History of Falls Sitting-balance support: No upper extremity supported, Feet supported Sitting balance-Leahy Scale: Normal Standing balance support: Bilateral upper extremity supported, Single extremity supported, During functional activity Standing  balance-Leahy Scale: Poor Standing balance comment: Relies on at least single UE support    Special needs/care consideration BiPAP/CPAP: Yes, BiPAP CPM: No Continuous Drip IV: No Dialysis: Yes        Days: M,W,F at [place]: No Oxygen: No Special Bed: No Trach Size: No Wound Vac (area): No       Skin: Dry, intact                                Bowel mgmt: Continent, last BM 12/25  Bladder mgmt: Continent, oliguria  Diabetic mgmt: Yes, with CBG checks, oral meds, and diet PTA    Previous Home Environment Living Arrangements: Alone Available Help at Discharge: Family, Available 24 hours/day Type of Home: House Home Layout: (unable to obtain complete PLOF history due to urgency of attempting BM ) Home Access: Level entry Bathroom Shower/Tub: Tub/shower unit, Architectural technologist: Standard Bathroom Accessibility: Yes How Accessible: Accessible via walker Home Care Services: No Additional Comments: PLOF information taken from prior chart review   Discharge Living Setting Plans for Discharge Living Setting: Patient's home, Other (Comment)(Daughter, Monique to stay with patient and assist after disc) Type of Home at Discharge: House Discharge Home Layout: One level Discharge Home Access: Stairs to enter Entrance Stairs-Rails: None Entrance Stairs-Number of Steps: 2-3 Discharge Bathroom Shower/Tub: Tub/shower unit Discharge Bathroom Toilet: Standard Discharge Bathroom Accessibility: Yes How Accessible: Accessible via walker Does the patient have any problems obtaining your medications?: No  Social/Family/Support Systems Patient Roles: Parent Contact Information: Daughter: Beckie Busing [phone] Anticipated Caregiver: Daughter  Anticipated Caregiver's Contact Information: see above  Ability/Limitations of Caregiver: None per her report  Caregiver Availability: 24/7 Discharge Plan Discussed with Primary Caregiver: Yes Is Caregiver In Agreement with Plan?:  Yes Does Caregiver/Family have Issues with Lodging/Transportation while Pt is in Rehab?: No  Goals/Additional Needs Patient/Family Goal for Rehab: PT/OT/SLP: Mod I-Supervision  Expected length of stay: 4-8 days  Cultural Considerations: None Dietary Needs: Carb. Mod. diet restrictions  Equipment Needs: TBD Additional Information: Difficulty with weight bearing restrictions  Pt/Family Agrees to Admission and willing to  participate: Yes Program Orientation Provided & Reviewed with Pt/Caregiver Including Roles  & Responsibilities: Yes Additional Information Needs: Patient connected with HD at [facility] M,W,F schedule  Information Needs to be Provided By: Team FYI  Decrease burden of Care through IP rehab admission: No  Possible need for SNF placement upon discharge: No  Patient Condition: This patient's medical and functional status has changed since the consult dated: 01/30/17 in which the Rehabilitation Physician determined and documented that the patient's condition is appropriate for intensive rehabilitative care in an inpatient rehabilitation facility. See "History of Present Illness" (above) for medical update. Functional changes are: Currently requiring min assist to ambulate 30 feet RW. Patient's medical and functional status update has been discussed with the Rehabilitation physician and patient remains appropriate for inpatient rehabilitation. Will admit to inpatient rehab today.  Preadmission Screen Completed By:  Gunnar Fusi, with brief updates by Karene Fry, RN 02/03/2017 11:53 AM ______________________________________________________________________   Discussed status with Dr. Posey Pronto on 02/03/17 at 1151 and received telephone approval for admission today.  Admission Coordinator:  Retta Diones, time 1151/Date 02/03/17

## 2017-02-01 NOTE — Progress Notes (Signed)
Patient removed the dressing and the ortho device/splint in left lower extremities and refused to put it back.RN explained to the patient the importance of the device,understand well but refuses to put it back.Will continue to monitor.

## 2017-02-01 NOTE — Progress Notes (Signed)
[facility] Progress Note  Subjective: no c/o  Vitals:   01/31/17 2105 01/31/17 2300 02/01/17 0547 02/01/17 0744  BP:  137/77 (!) 177/84 (!) 158/79  Pulse: 78 98 77 (!) 105  Resp: 19 (!) 24    Temp: 99.2 F (37.3 C) 99.1 F (37.3 C) 98 F (36.7 C) 99 F (37.2 C)  TempSrc: Oral Oral Oral Oral  SpO2: 95% 98% 100% 98%  Weight:      Height:        Inpatient medications: . alteplase  4 mg Intracatheter Once  . amLODipine  10 mg Oral Daily  . atorvastatin  20 mg Oral q1800  . calcitRIOL  2 mcg Oral Q M,W,F  . calcium acetate  1,334 mg Oral TID WC  . cinacalcet  120 mg Oral Once per day on Mon Wed Fri  . folic acid  1 mg Oral Daily  . heparin  5,000 Units Subcutaneous Q8H  . heparin  8,600 Units Dialysis Once in dialysis  . insulin aspart  0-15 Units Subcutaneous TID AC & HS  . metoprolol succinate  12.5 mg Oral Q T,Th,S,Su  . pantoprazole  40 mg Oral Daily  . tamsulosin  0.4 mg Oral QPC supper   . sodium chloride    . sodium chloride     sodium chloride, sodium chloride, acetaminophen **OR** acetaminophen, alteplase, heparin, hydrALAZINE, ipratropium-albuterol, lidocaine (PF), lidocaine-prilocaine, ondansetron **OR** ondansetron (ZOFRAN) IV, pentafluoroprop-tetrafluoroeth  Exam: Alert,no distress No jvd Chest cta RRR Abd soft ntnd Ext no edema LLE wrapped NF, ox 3  Dialysis: MWF [facility] 4h 32min  95.5kg   2/2.5   Hep 8600  R IJ cath /Rt BCF recently revised by Dr. Donzetta Matters + PTA by Dr. Scot Dock -calc 2 ug -mirc 50 given 11/28 -sensipar 120 tiw po       Impression: 1. ESRD MWF HD 2. AMS - resolved, to baseline 3. L ankle/ fibula fracture 4. Anemia of CKD - Hb ok 5. MBD of CKD - cont meds 6. DM 7. COPD 8. HTN - cont norvasc/ metop, lowering edw significantly, down 6kg 9. Dysphagia - per primary  Plan - next HD Gretta Arab MD [place] pager [phone]   02/01/2017, 11:26 AM   Recent Labs  Lab 01/27/17 0917  01/29/17 0314 01/31/17 0955  NA 132* 135  133* 131*  K 3.8 4.4  4.6 4.0  CL 94* 99*  98* 93*  CO2 26 24  22 25   GLUCOSE 187* 97  90 104*  BUN 38* 34*  37* 46*  CREATININE 9.25* 8.86*  8.66* 8.86*  CALCIUM 8.3* 9.1  8.9 8.8*  PHOS 7.4* 5.9* 5.0*   Recent Labs  Lab 01/26/17 2150 01/27/17 0917 01/29/17 0314 01/31/17 0955  AST 37  --   --   --   ALT 46  --   --   --   ALKPHOS 34*  --   --   --   BILITOT 0.5  --   --   --   PROT 6.0*  --   --   --   ALBUMIN 3.2* 2.9* 3.5 3.3*   Recent Labs  Lab 01/27/17 0237 01/28/17 0532 01/29/17 0314 01/31/17 0955  WBC 8.5 12.2* 11.3* 9.7  NEUTROABS 5.4 8.4* 7.8*  --   HGB 10.1* 10.0* 10.6* 9.8*  HCT 29.7* 30.7* 31.1* 29.0*  MCV 103.1* 104.1* 104.4* 102.8*  PLT 227 247 272 274   Iron/TIBC/Ferritin/ %Sat  Component Value Date/Time   IRON 55 03/09/2015 0442   TIBC 244 (L) 03/09/2015 0442   FERRITIN 348 (H) 03/09/2015 0442   IRONPCTSAT 23 03/09/2015 0442

## 2017-02-01 NOTE — Progress Notes (Signed)
Inpatient Rehabilitation  Received notification from acute MD that patient is not medically ready today.  Please plan for my co-worker Karene Fry to follow up 12/26.  Call if questions.    Carmelia Roller., CCC/SLP Admission Coordinator  [place]  Cell [phone]

## 2017-02-01 NOTE — Plan of Care (Signed)
  Pain Managment: General experience of comfort will improve 02/01/2017 2246 - Progressing by Lesle Reek, RN

## 2017-02-01 NOTE — Progress Notes (Signed)
Physical Therapy Treatment Patient Details Name: [name] MRN: [id number] DOB: 11-28-1950 Today's Date: 02/01/2017    History of Present Illness Pt is a 66 yo male who was found on floor at home by daughter after missing dialysis, also recently admitted with L fibular fracture. Diagnosed with hyperkalemia, acute metaboilc encephalopathy, SIRS/Sepsis, ESRD, HTN emergency. PMH L fibular fracture NWB status, CKD, COPD, DM, HTN, THA, AV fistula, total shoulder R     PT Comments    Pt performed short bout of gait training but remains to present with poor safety awareness and inability to maintain weight bearing due to fatigue and decreased strength.   Pt remains appropriate for CIR therapies to improve strength and return home.  Plan next session for continued gait training and strengthening for improved carryover through recovery process.     Follow Up Recommendations  CIR     Equipment Recommendations  None recommended by PT    Recommendations for Other Services Rehab consult     Precautions / Restrictions Precautions Precautions: Fall Precaution Comments: Per initial ED note, pt is to be NWB and follow up with ortho in office  Required Braces or Orthoses: Other Brace/Splint Other Brace/Splint: Short leg splint/CAM boot order Restrictions Weight Bearing Restrictions: Yes LLE Weight Bearing: Non weight bearing Other Position/Activity Restrictions: Per initial ED note, pt to be NWB and follow up with ortho in office     Mobility  Bed Mobility               General bed mobility comments: Pt received in recliner on arrival.    Transfers Overall transfer level: Needs assistance Equipment used: Rolling walker (2 wheeled)(do not feel patient is safe with injury to use knee walker.  ) Transfers: Sit to/from Stand Sit to Stand: Min assist Stand pivot transfers: Min assist       General transfer comment: Cues for hand placement to and from seated surface.  Cues for  maintaining NWB.  Pt placed foot on floor during transition despite cues to keep weight off loaded from foot. CAM walker applied in sitting pre transfer.    Ambulation/Gait Ambulation/Gait assistance: Min assist Ambulation Distance (Feet): 30 Feet Assistive device: Rolling walker (2 wheeled) Gait Pattern/deviations: Step-to pattern;Decreased stride length(hop to) Gait velocity: decreased Gait velocity interpretation: Below normal speed for age/gender General Gait Details: During last 10 ft of Gait training patient began to demonstrate touch down weight bearing.  Pt required cues for sequencing and to keep RW close to improve safety with hop to pattern.  Pt remains with poor safety awareness and decreased awareness of deficits.     Stairs            Wheelchair Mobility    Modified Rankin (Stroke Patients Only)       Balance                                            Cognition Arousal/Alertness: Awake/alert Behavior During Therapy: Flat affect;WFL for tasks assessed/performed Overall Cognitive Status: No family/caregiver present to determine baseline cognitive functioning                                 General Comments: Pt demonstrates decreased safety awareness.  He is told to not  put weight through LLE with transfers and he says  OK, but then does anyway.       Exercises General Exercises - Lower Extremity Long Arc Quad: AROM;Both;10 reps;Supine Hip Flexion/Marching: AROM;Both;10 reps;Seated    General Comments        Pertinent Vitals/Pain Pain Assessment: 0-10 Pain Score: 6  Pain Location: left above ankle Pain Descriptors / Indicators: Aching Pain Intervention(s): Monitored during session;Repositioned    Home Living                      Prior Function            PT Goals (current goals can now be found in the care plan section) Acute Rehab PT Goals Patient Stated Goal: to go home  Potential to Achieve Goals:  Good Progress towards PT goals: Progressing toward goals    Frequency    Min 5X/week      PT Plan Current plan remains appropriate    Co-evaluation              AM-PAC PT "6 Clicks" Daily Activity  Outcome Measure  Difficulty turning over in bed (including adjusting bedclothes, sheets and blankets)?: None Difficulty moving from lying on back to sitting on the side of the bed? : None Difficulty sitting down on and standing up from a chair with arms (e.g., wheelchair, bedside commode, etc,.)?: Unable Help needed moving to and from a bed to chair (including a wheelchair)?: A Little Help needed walking in hospital room?: A Little Help needed climbing 3-5 steps with a railing? : Total 6 Click Score: 16    End of Session Equipment Utilized During Treatment: Gait belt Activity Tolerance: Patient limited by fatigue Patient left: in chair;with call bell/phone within reach;with nursing/sitter in room Nurse Communication: Mobility status PT Visit Diagnosis: Other abnormalities of gait and mobility (R26.89);Unsteadiness on feet (R26.81)     Time: 2505-3976 PT Time Calculation (min) (ACUTE ONLY): 26 min  Charges:  $Gait Training: 8-22 mins $Therapeutic Exercise: 8-22 mins                    G Codes:       Governor Rooks, PTA pager [phone]    Cristela Blue 02/01/2017, 11:21 AM

## 2017-02-01 NOTE — Progress Notes (Signed)
Inpatient Rehabilitation  Spoke with patient's daughter, Beckie Busing who states that she can care for him after CIR and assist as needed.  I have text paged acute MD and await acute medical clearance prior to potential IP Rehab admission today.  Call if questions.   Carmelia Roller., CCC/SLP Admission Coordinator  [facility]  Cell [phone]

## 2017-02-01 NOTE — Progress Notes (Signed)
Notified by CCMD of 7 beat run of v tach at 1114, pt asymptomatic, and returned to normal sinus rhythm. Provider Algis Liming, MD notified.
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
## 2017-02-01 NOTE — Progress Notes (Signed)
Inpatient Rehabilitation  Met with patient to discuss team's recommendation for IP Rehab.  Shared booklets and answered questions.  Patient reports that he lives with his daughter and she can assist upon discharge.  I potentially have a bed to offer patient today.  Plan to clarify with team after I speak with daughter; I await call back from [place] at this time.  Call if questions.   Carmelia Roller., CCC/SLP Admission Coordinator  [place]  Cell [phone]

## 2017-02-01 NOTE — Progress Notes (Signed)
PROGRESS NOTE    [name]  PFX:[id number] DOB: [birth year] DOA: 01/20/2017 PCP: [facility] [place]   Specialists:     Brief Narrative:  66 year old male ESRD on HD MWF HTN DM TY 2 HLD prostate Ca s/p prostatectomy, ventral hernia repair fall 12/11 fractured distal left fibula sent home on pain medication follow-up with orthopedics Daughter received a call 12/12 as patient acutely altered missed dialysis-came to emergency room-workup revealed K 6.8 BUN/creatinine 86/14 anion gap 17 CXR negative CT head negative EKG T wave peak Neurology and nephrology consulted Patient inproved and felt to have parasomnias and undiagnosed OSA leading to confusion around sleep-wakefullness  Assessment & Plan:   Principal Problem:   Hyperkalemia Active Problems:   Diabetes mellitus ([place])   End stage renal disease on dialysis ([place])   SIRS (systemic inflammatory response syndrome) ([place])   Hypertensive urgency   Closed fracture of distal end of left fibula   Anemia due to chronic kidney disease   Acute metabolic encephalopathy   Abnormal swallowing   Altered mental status   Chronic obstructive pulmonary disease ([place])   Closed fracture of right distal fibula   Toxic metabolic encephalopathy 2/2 to meds and U[place]-possible undiag OSA and parasomnias - MRI neg, BCx2 negative, pro-calcitonin neg, ammonia negative, EEG generalized irregular - recurrent metabolic encephalopathy 32/99?  Sleepiness 12/18 pm-had some parasomnias 12/20 am - need Sleep study and OP management for this - Resolved.  ? OSA/Parasomnia - somnolence on 12/18 pm and was difficult to arouse as OP. - does need formal OSA testing split somnogram-referral placed to CM for management--arranging machine and RN did confirm this with CM 12/21  - Will need CPAP settings of 12/6 written on d/c for machine - Patient reportedly used CPAP for some time last night. No further parasomnias reported.  ?  Sepsis - cultures negative  on empiric Zosyn treating for 7 days since stopping 12/20-white count trend 15-->9.0-->12---vancomycin d/c 12/16 and has resolved without recurrent concerns for sepsis  Hiccups Trial of Thorazine 25 IM x 1 Resolved completely  Dysphagia Speech has seen and recommended dysphagia 3 diet Speech therapy reassessed 12/19 and recommended regular diet and thin liquids.  ESRD MWF Hyperkalemia-resolved with treatment calcium gluconate dextrose and with HD. Nephrology follow-up appreciated.  Anemia of renal disease Stable.  Secondary hyperparathyroidism Defer to nephrology Continue calcitriol 2 mcg MWF, Sensipar 120  Hypertensive urgency on admission  Continue amlodipine 10, hydralazine 10 every 4 as needed blood pressure >180--Added toprol xl 12.5 t-th-s-s Improving with better blood pressures. Volume management across dialysis. As per nephrology, lowering EDW significantly, down 6 KG. Monitor and adjust medications as needed.  Closed fracture distal end of left fibula secondary to fall Continue Ace wrap splint and needs follow-up with Dr. Percell Miller of orthopedics as an outpatient nwb presumed and OP therapy Patient was supposed to follow-up with orthopedics as outpatient on 01/22/17 but hospitalized here on 12/12. Noted early morning events where patient remove dressing, or throat device/splint in left lower extremity and refused to put it back. Patient reports some pain in left leg. I discussed with Dr. Edmonia Lynch, Orthopedics and requested inpatient consultation and recommendations. Await input.  Hyperlipidemia Continue Lipitor 20  Macrocytosis-mild leukocytosis Continue b12 If persists will consider work-up with hematology as OP  Type II DM with renal complications - Well controlled in the hospital.  DVT prophylaxis: Heparin CODE STATUS: Full Family communication: None at bedside. Disposition: Will need CPAP at discharge. DC to CIR when medically improved  and pending  orthopedics input.  Consultants:   Nephrology  Rehabilitation M.D.  Orthopedics.  Procedures:   HD  Antimicrobials:   Zosyn-completed course   Subjective:  Seen this morning. Early am events noted-removed dressing and or throat device/splint in left leg and diffuse to put it back on. Reports some pain in that leg. States that he had CPAP on last night for some time. No chest pain or dyspnea reported.  Objective: Vitals:   02/01/17 0547 02/01/17 0744 02/01/17 1233 02/01/17 1236  BP: (!) 177/84 (!) 158/79  (!) 152/67  Pulse: 77 (!) 105    Resp:   16 18  Temp: 98 F (36.7 C) 99 F (37.2 C)  98.2 F (36.8 C)  TempSrc: Oral Oral  Oral  SpO2: 100% 98%  100%  Weight:      Height:        Intake/Output Summary (Last 24 hours) at 02/01/2017 1322 Last data filed at 02/01/2017 1230 Gross per 24 hour  Intake 1280 ml  Output 601 ml  Net 679 ml   Filed Weights   01/30/17 0454 01/31/17 0734 01/31/17 1145  Weight: 87.5 kg (192 lb 14.4 oz) 90.5 kg (199 lb 8.3 oz) 88.7 kg (195 lb 8.8 oz)    Examination:  Gen. exam: Pleasant middle-aged male, moderately built and nourished, lying comfortably in bed. Does not appear in any distress. Respiratory system: Clear to auscultation. No increased work of breathing. Stable without change. Cardiovascular system: S1 and S2 heard, RRR. No JVD, murmurs or pedal edema. Telemetry: Sinus rhythm. Stable without change. Abdominal exam: Nondistended, soft and nontender. Normal bowel sounds heard. CNS: Alert and oriented. No focal neurological deficits. Stable without change. Extremities: Left leg dressing/cast intact. Moves other 3 limbs without difficulty. Psychiatry: Mood appears slightly irritable.  Data Reviewed: I have personally reviewed following labs and imaging studies  CBC: Recent Labs  Lab 01/26/17 0432 01/27/17 0237 01/28/17 0532 01/29/17 0314 01/31/17 0955  WBC 9.0 8.5 12.2* 11.3* 9.7  NEUTROABS 6.6 5.4 8.4* 7.8*  --     HGB 10.8* 10.1* 10.0* 10.6* 9.8*  HCT 33.2* 29.7* 30.7* 31.1* 29.0*  MCV 104.7* 103.1* 104.1* 104.4* 102.8*  PLT 225 227 247 272 413   Basic Metabolic Panel: Recent Labs  Lab 01/26/17 0432 01/26/17 2150 01/27/17 0917 01/29/17 0314 01/31/17 0955  NA 134* 133* 132* 135  133* 131*  K 3.9 3.9 3.8 4.4  4.6 4.0  CL 96* 96* 94* 99*  98* 93*  CO2 28 27 26 24  22 25   GLUCOSE 133* 129* 187* 97  90 104*  BUN 25* 35* 38* 34*  37* 46*  CREATININE 6.43* 8.43* 9.25* 8.86*  8.66* 8.86*  CALCIUM 8.4* 8.6* 8.3* 9.1  8.9 8.8*  PHOS 6.0*  --  7.4* 5.9* 5.0*   GFR: Estimated Creatinine Clearance: 8.6 mL/min (A) (by C-G formula based on SCr of 8.86 mg/dL (H)). Liver Function Tests: Recent Labs  Lab 01/26/17 2150 01/27/17 0917 01/29/17 0314 01/31/17 0955  AST 37  --   --   --   ALT 46  --   --   --   ALKPHOS 34*  --   --   --   BILITOT 0.5  --   --   --   PROT 6.0*  --   --   --   ALBUMIN 3.2* 2.9* 3.5 3.3*   CBG: Recent Labs  Lab 01/31/17 1234 01/31/17 1649 01/31/17 2110 02/01/17 0614 02/01/17 1139  GLUCAP  111* 110* 146* 107* 156*   Urine analysis:    Component Value Date/Time   COLORURINE YELLOW 12/15/2016 1736   APPEARANCEUR CLEAR 12/15/2016 1736   LABSPEC 1.011 12/15/2016 1736   PHURINE 7.0 12/15/2016 1736   GLUCOSEU 50 (A) 12/15/2016 1736   HGBUR NEGATIVE 12/15/2016 1736   BILIRUBINUR NEGATIVE 12/15/2016 1736   KETONESUR NEGATIVE 12/15/2016 1736   PROTEINUR >=300 (A) 12/15/2016 1736   UROBILINOGEN 0.2 09/08/2007 1345   NITRITE NEGATIVE 12/15/2016 1736   LEUKOCYTESUR NEGATIVE 12/15/2016 1736     Radiology Studies: Reviewed images personally in health database    Scheduled Meds: . alteplase  4 mg Intracatheter Once  . amLODipine  10 mg Oral Daily  . atorvastatin  20 mg Oral q1800  . calcitRIOL  2 mcg Oral Q M,W,F  . calcium acetate  1,334 mg Oral TID WC  . cinacalcet  120 mg Oral Once per day on Mon Wed Fri  . folic acid  1 mg Oral Daily  . heparin   5,000 Units Subcutaneous Q8H  . heparin  8,600 Units Dialysis Once in dialysis  . insulin aspart  0-15 Units Subcutaneous TID AC & HS  . metoprolol succinate  12.5 mg Oral Q T,Th,S,Su  . pantoprazole  40 mg Oral Daily  . tamsulosin  0.4 mg Oral QPC supper   Continuous Infusions: . sodium chloride    . sodium chloride       LOS: 12 days    Time spent: [place], MD, [place], [facility]. [facility] Pager [phone]  If 7PM-7AM, please contact night-coverage [email or web address] Password [email or web address] 02/01/2017, 1:22 PM

## 2017-02-02 LAB — GLUCOSE, CAPILLARY
GLUCOSE-CAPILLARY: 109 mg/dL — AB (ref 65–99)
Glucose-Capillary: 128 mg/dL — ABNORMAL HIGH (ref 65–99)
Glucose-Capillary: 153 mg/dL — ABNORMAL HIGH (ref 65–99)
Glucose-Capillary: 155 mg/dL — ABNORMAL HIGH (ref 65–99)

## 2017-02-02 NOTE — Progress Notes (Signed)
PROGRESS NOTE    [name]  STM:[id number] DOB: April 02, 1950 DOA: 01/20/2017 PCP: [facility]   Specialists:     Brief Narrative:  66 year old male ESRD on HD MWF HTN DM TY 2 HLD prostate Ca s/p prostatectomy, ventral hernia repair fall 12/11 fractured distal left fibula sent home on pain medication follow-up with orthopedics Daughter received a call 12/12 as patient acutely altered missed dialysis-came to emergency room-workup revealed K 6.8 BUN/creatinine 86/14 anion gap 17 CXR negative CT head negative EKG T wave peak Neurology and nephrology consulted Patient inproved and felt to have parasomnias and undiagnosed OSA leading to confusion around sleep-wakefullness  Assessment & Plan:   Principal Problem:   Hyperkalemia Active Problems:   Diabetes mellitus ([place])   End stage renal disease on dialysis ([place])   SIRS (systemic inflammatory response syndrome) ([place])   Hypertensive urgency   Closed fracture of distal end of left fibula   Anemia due to chronic kidney disease   Acute metabolic encephalopathy   Abnormal swallowing   Altered mental status   Chronic obstructive pulmonary disease ([place])   Closed fracture of right distal fibula   Toxic metabolic encephalopathy 2/2 to meds and Uremia-possible undiag OSA and parasomnias - MRI neg, BCx2 negative, pro-calcitonin neg, ammonia negative, EEG generalized irregular - recurrent metabolic encephalopathy 89/21?  Sleepiness 12/18 pm-had some parasomnias 12/20 am - need Sleep study and OP management for this - Resolved.  ? OSA/Parasomnia - somnolence on 12/18 pm and was difficult to arouse as OP. - does need formal OSA testing split somnogram-referral placed to CM for management--arranging machine and RN did confirm this with CM 12/21  - Will need CPAP settings of 12/6 written on d/c for machine - No further parasomnias reported. - Reportedly using CPAP at night but RN unable to confirm.  ?  Sepsis - cultures  negative on empiric Zosyn treating for 7 days since stopping 12/20-white count trend 15-->9.0-->12---vancomycin d/c 12/16 and has resolved without recurrent concerns for sepsis  Hiccups Trial of Thorazine 25 IM x 1 Resolved completely  Dysphagia Speech has seen and recommended dysphagia 3 diet Speech therapy reassessed 12/19 and recommended regular diet and thin liquids.  ESRD MWF Hyperkalemia-resolved with treatment calcium gluconate dextrose and with HD. Nephrology follow-up appreciated.  Anemia of renal disease Stable.  Secondary hyperparathyroidism Defer to nephrology Continue calcitriol 2 mcg MWF, Sensipar 120  Hypertensive urgency on admission  Continue amlodipine 10, hydralazine 10 every 4 as needed blood pressure >180--Added toprol xl 12.5 t-th-s-s Improving with better blood pressures. Volume management across dialysis. As per nephrology, lowering EDW significantly, down 6 KG. Monitor and adjust medications as needed.  Closed fracture distal end of left fibula secondary to fall Continue Ace wrap splint and needs follow-up with Dr. Percell Miller of orthopedics as an outpatient nwb presumed and OP therapy Patient was supposed to follow-up with orthopedics as outpatient on 01/22/17 but hospitalized here on 12/12. Orthopedic consultation 12/24 appreciated: Placed in a short-leg cast, nonweightbearing on his left leg for another 3 weeks when able convert him to his cam boot which she has, follow-up x-ray in 2 weeks, follow-up with them in 10-14 days.  Hyperlipidemia Continue Lipitor 20  Macrocytosis-mild leukocytosis Continue b12 If persists will consider work-up with hematology as OP  Type II DM with renal complications - Well controlled in the hospital.  DVT prophylaxis: Heparin CODE STATUS: Full Family communication: None at bedside. Disposition: Will need CPAP at discharge. DC to CIR likely 12/26 pending bed  availability. Downgraded from step down to medical bed  12/25.  Consultants:   Nephrology  Rehabilitation M.D.  Orthopedics.  Procedures:   HD  Antimicrobials:   Zosyn-completed course   Subjective:  Sleeping this morning. Easily aroused. Reports pain of his left leg is better. Also reported that he used CPAP overnight for 6 hours. Currently off.  Objective: Vitals:   02/01/17 2106 02/01/17 2318 02/02/17 0437 02/02/17 1112  BP: (!) 158/76 (!) 157/82 (!) 152/61   Pulse: 87 85 82   Resp: 19  (!) 28   Temp: 98.9 F (37.2 C) (!) 97.1 F (36.2 C) 97.6 F (36.4 C)   TempSrc: Oral Axillary Oral   SpO2: 99% 95% 100% 97%  Weight:      Height:        Intake/Output Summary (Last 24 hours) at 02/02/2017 1550 Last data filed at 02/02/2017 0940 Gross per 24 hour  Intake -  Output 552 ml  Net -552 ml   Filed Weights   01/30/17 0454 01/31/17 0734 01/31/17 1145  Weight: 87.5 kg (192 lb 14.4 oz) 90.5 kg (199 lb 8.3 oz) 88.7 kg (195 lb 8.8 oz)    Examination:  Gen. exam: Pleasant middle-aged male, moderately built and nourished, lying comfortably in bed. Does not appear in any distress. Respiratory system: Clear to auscultation. No increased work of breathing. Stable without change. Cardiovascular system: S1 and S2 heard, RRR. No JVD, murmurs or pedal edema. Telemetry: Personally reviewed and shows sinus rhythm. Reported NSVT from 12/24 seems to be an artifact. Stable without change. Abdominal exam: Nondistended, soft and nontender. Normal bowel sounds heard. CNS: Alert and oriented. No focal neurological deficits. Stable without change. Extremities: Left leg dressing/cast intact. Moves other 3 limbs without difficulty. Psychiatry: Mood appears slightly irritable.  Data Reviewed: I have personally reviewed following labs and imaging studies  CBC: Recent Labs  Lab 01/27/17 0237 01/28/17 0532 01/29/17 0314 01/31/17 0955  WBC 8.5 12.2* 11.3* 9.7  NEUTROABS 5.4 8.4* 7.8*  --   HGB 10.1* 10.0* 10.6* 9.8*  HCT 29.7* 30.7*  31.1* 29.0*  MCV 103.1* 104.1* 104.4* 102.8*  PLT 227 247 272 914   Basic Metabolic Panel: Recent Labs  Lab 01/26/17 2150 01/27/17 0917 01/29/17 0314 01/31/17 0955  NA 133* 132* 135  133* 131*  K 3.9 3.8 4.4  4.6 4.0  CL 96* 94* 99*  98* 93*  CO2 27 26 24  22 25   GLUCOSE 129* 187* 97  90 104*  BUN 35* 38* 34*  37* 46*  CREATININE 8.43* 9.25* 8.86*  8.66* 8.86*  CALCIUM 8.6* 8.3* 9.1  8.9 8.8*  PHOS  --  7.4* 5.9* 5.0*   GFR: Estimated Creatinine Clearance: 8.6 mL/min (A) (by C-G formula based on SCr of 8.86 mg/dL (H)). Liver Function Tests: Recent Labs  Lab 01/26/17 2150 01/27/17 0917 01/29/17 0314 01/31/17 0955  AST 37  --   --   --   ALT 46  --   --   --   ALKPHOS 34*  --   --   --   BILITOT 0.5  --   --   --   PROT 6.0*  --   --   --   ALBUMIN 3.2* 2.9* 3.5 3.3*   CBG: Recent Labs  Lab 02/01/17 1139 02/01/17 1644 02/01/17 2100 02/02/17 0653 02/02/17 1109  GLUCAP 156* 104* 110* 109* 128*   Urine analysis:    Component Value Date/Time   COLORURINE YELLOW 12/15/2016 1736  APPEARANCEUR CLEAR 12/15/2016 1736   LABSPEC 1.011 12/15/2016 1736   PHURINE 7.0 12/15/2016 1736   GLUCOSEU 50 (A) 12/15/2016 1736   HGBUR NEGATIVE 12/15/2016 1736   BILIRUBINUR NEGATIVE 12/15/2016 1736   KETONESUR NEGATIVE 12/15/2016 1736   PROTEINUR >=300 (A) 12/15/2016 1736   UROBILINOGEN 0.2 09/08/2007 1345   NITRITE NEGATIVE 12/15/2016 1736   LEUKOCYTESUR NEGATIVE 12/15/2016 1736     Radiology Studies: Reviewed images personally in health database    Scheduled Meds: . alteplase  4 mg Intracatheter Once  . amLODipine  10 mg Oral Daily  . atorvastatin  20 mg Oral q1800  . calcitRIOL  2 mcg Oral Q M,W,F  . calcium acetate  1,334 mg Oral TID WC  . cinacalcet  120 mg Oral Once per day on Mon Wed Fri  . folic acid  1 mg Oral Daily  . heparin  5,000 Units Subcutaneous Q8H  . heparin  8,600 Units Dialysis Once in dialysis  . insulin aspart  0-15 Units Subcutaneous  TID AC & HS  . metoprolol succinate  12.5 mg Oral Q T,Th,S,Su  . pantoprazole  40 mg Oral Daily  . tamsulosin  0.4 mg Oral QPC supper   Continuous Infusions: . sodium chloride    . sodium chloride       LOS: 13 days     Vernell Leep, MD, FACP, [facility]. [facility] Pager [phone]  If 7PM-7AM, please contact night-coverage [email or web address] Password [email or web address] 02/02/2017, 3:50 PM

## 2017-02-02 NOTE — Progress Notes (Signed)
[facility] Progress Note  Subjective: no c/o  Vitals:   02/01/17 2106 02/01/17 2318 02/02/17 0437 02/02/17 1112  BP: (!) 158/76 (!) 157/82 (!) 152/61   Pulse: 87 85 82   Resp: 19  (!) 28   Temp: 98.9 F (37.2 C) (!) 97.1 F (36.2 C) 97.6 F (36.4 C)   TempSrc: Oral Axillary Oral   SpO2: 99% 95% 100% 97%  Weight:      Height:        Inpatient medications: . alteplase  4 mg Intracatheter Once  . amLODipine  10 mg Oral Daily  . atorvastatin  20 mg Oral q1800  . calcitRIOL  2 mcg Oral Q M,W,F  . calcium acetate  1,334 mg Oral TID WC  . cinacalcet  120 mg Oral Once per day on Mon Wed Fri  . folic acid  1 mg Oral Daily  . heparin  5,000 Units Subcutaneous Q8H  . heparin  8,600 Units Dialysis Once in dialysis  . insulin aspart  0-15 Units Subcutaneous TID AC & HS  . metoprolol succinate  12.5 mg Oral Q T,Th,S,Su  . pantoprazole  40 mg Oral Daily  . tamsulosin  0.4 mg Oral QPC supper   . sodium chloride    . sodium chloride     sodium chloride, sodium chloride, acetaminophen **OR** acetaminophen, alteplase, heparin, hydrALAZINE, ipratropium-albuterol, lidocaine (PF), lidocaine-prilocaine, ondansetron **OR** ondansetron (ZOFRAN) IV, pentafluoroprop-tetrafluoroeth  Exam: Alert,no distress No jvd Chest cta RRR Abd soft ntnd Ext no edema LLE wrapped NF, ox 3  Dialysis: MWF [facility] 4h 4min  95.5kg   2/2.5   Hep 8600  R IJ cath / (and Rt BCF recently revised by Dr. Donzetta Matters + PTA by Dr. Scot Dock) -calc 2 ug -mirc 50 given 11/28 -sensipar 120 tiw po       Impression: 1. ESRD MWF HD 2. AMS - resolved, to baseline 3. L distal fibula fracture - casted by ortho, for f/u in OP setting.  4. Anemia of CKD - Hb ok 5. MBD of CKD - cont meds 6. DM 7. COPD 8. HTN - cont norvasc/ metop, lowering edw significantly, down 6kg 9. Dispo - CIR d/c pending  Plan - HD Gretta Arab MD [facility] pager [phone]   02/02/2017, 1:55 PM   Recent  Labs  Lab 01/27/17 0917 01/29/17 0314 01/31/17 0955  NA 132* 135  133* 131*  K 3.8 4.4  4.6 4.0  CL 94* 99*  98* 93*  CO2 26 24  22 25   GLUCOSE 187* 97  90 104*  BUN 38* 34*  37* 46*  CREATININE 9.25* 8.86*  8.66* 8.86*  CALCIUM 8.3* 9.1  8.9 8.8*  PHOS 7.4* 5.9* 5.0*   Recent Labs  Lab 01/26/17 2150 01/27/17 0917 01/29/17 0314 01/31/17 0955  AST 37  --   --   --   ALT 46  --   --   --   ALKPHOS 34*  --   --   --   BILITOT 0.5  --   --   --   PROT 6.0*  --   --   --   ALBUMIN 3.2* 2.9* 3.5 3.3*   Recent Labs  Lab 01/27/17 0237 01/28/17 0532 01/29/17 0314 01/31/17 0955  WBC 8.5 12.2* 11.3* 9.7  NEUTROABS 5.4 8.4* 7.8*  --   HGB 10.1* 10.0* 10.6* 9.8*  HCT 29.7* 30.7* 31.1* 29.0*  MCV 103.1* 104.1* 104.4* 102.8*  PLT 227  247 272 274   Iron/TIBC/Ferritin/ %Sat    Component Value Date/Time   IRON 55 03/09/2015 0442   TIBC 244 (L) 03/09/2015 0442   FERRITIN 348 (H) 03/09/2015 0442   IRONPCTSAT 23 03/09/2015 0442

## 2017-02-03 ENCOUNTER — Other Ambulatory Visit: Payer: Self-pay

## 2017-02-03 ENCOUNTER — Inpatient Hospital Stay (HOSPITAL_COMMUNITY)
Admission: RE | Admit: 2017-02-03 | Discharge: 2017-02-16 | DRG: 070 | Disposition: A | Discharge status: Home Health | Payer: Medicare Other | Source: Intra-hospital | Attending: Physical Medicine & Rehabilitation | Admitting: Physical Medicine & Rehabilitation

## 2017-02-03 ENCOUNTER — Encounter (HOSPITAL_COMMUNITY): Payer: Self-pay | Admitting: *Deleted

## 2017-02-03 ENCOUNTER — Inpatient Hospital Stay (HOSPITAL_COMMUNITY): Payer: Medicare Other | Admitting: Speech Pathology

## 2017-02-03 DIAGNOSIS — I674 Hypertensive encephalopathy: Secondary | ICD-10-CM | POA: Diagnosis present

## 2017-02-03 DIAGNOSIS — S82832S Other fracture of upper and lower end of left fibula, sequela: Secondary | ICD-10-CM

## 2017-02-03 DIAGNOSIS — I10 Essential (primary) hypertension: Secondary | ICD-10-CM | POA: Diagnosis not present

## 2017-02-03 DIAGNOSIS — R05 Cough: Secondary | ICD-10-CM

## 2017-02-03 DIAGNOSIS — Z96643 Presence of artificial hip joint, bilateral: Secondary | ICD-10-CM | POA: Diagnosis present

## 2017-02-03 DIAGNOSIS — D631 Anemia in chronic kidney disease: Secondary | ICD-10-CM | POA: Diagnosis present

## 2017-02-03 DIAGNOSIS — Z8249 Family history of ischemic heart disease and other diseases of the circulatory system: Secondary | ICD-10-CM | POA: Diagnosis not present

## 2017-02-03 DIAGNOSIS — Z6829 Body mass index (BMI) 29.0-29.9, adult: Secondary | ICD-10-CM

## 2017-02-03 DIAGNOSIS — D72829 Elevated white blood cell count, unspecified: Secondary | ICD-10-CM

## 2017-02-03 DIAGNOSIS — E1169 Type 2 diabetes mellitus with other specified complication: Secondary | ICD-10-CM | POA: Diagnosis not present

## 2017-02-03 DIAGNOSIS — W19XXXS Unspecified fall, sequela: Secondary | ICD-10-CM | POA: Diagnosis present

## 2017-02-03 DIAGNOSIS — D638 Anemia in other chronic diseases classified elsewhere: Secondary | ICD-10-CM | POA: Diagnosis not present

## 2017-02-03 DIAGNOSIS — G629 Polyneuropathy, unspecified: Secondary | ICD-10-CM | POA: Diagnosis present

## 2017-02-03 DIAGNOSIS — Z79899 Other long term (current) drug therapy: Secondary | ICD-10-CM

## 2017-02-03 DIAGNOSIS — E1122 Type 2 diabetes mellitus with diabetic chronic kidney disease: Secondary | ICD-10-CM | POA: Diagnosis present

## 2017-02-03 DIAGNOSIS — K219 Gastro-esophageal reflux disease without esophagitis: Secondary | ICD-10-CM | POA: Diagnosis present

## 2017-02-03 DIAGNOSIS — I120 Hypertensive chronic kidney disease with stage 5 chronic kidney disease or end stage renal disease: Secondary | ICD-10-CM | POA: Diagnosis present

## 2017-02-03 DIAGNOSIS — F17210 Nicotine dependence, cigarettes, uncomplicated: Secondary | ICD-10-CM | POA: Diagnosis present

## 2017-02-03 DIAGNOSIS — E669 Obesity, unspecified: Secondary | ICD-10-CM | POA: Diagnosis present

## 2017-02-03 DIAGNOSIS — Z992 Dependence on renal dialysis: Secondary | ICD-10-CM | POA: Diagnosis not present

## 2017-02-03 DIAGNOSIS — J449 Chronic obstructive pulmonary disease, unspecified: Secondary | ICD-10-CM | POA: Diagnosis present

## 2017-02-03 DIAGNOSIS — G9341 Metabolic encephalopathy: Principal | ICD-10-CM | POA: Diagnosis present

## 2017-02-03 DIAGNOSIS — S82832D Other fracture of upper and lower end of left fibula, subsequent encounter for closed fracture with routine healing: Secondary | ICD-10-CM | POA: Diagnosis not present

## 2017-02-03 DIAGNOSIS — N186 End stage renal disease: Secondary | ICD-10-CM | POA: Diagnosis present

## 2017-02-03 DIAGNOSIS — E785 Hyperlipidemia, unspecified: Secondary | ICD-10-CM | POA: Diagnosis present

## 2017-02-03 DIAGNOSIS — Z96611 Presence of right artificial shoulder joint: Secondary | ICD-10-CM | POA: Diagnosis present

## 2017-02-03 DIAGNOSIS — R059 Cough, unspecified: Secondary | ICD-10-CM

## 2017-02-03 LAB — RENAL FUNCTION PANEL
ANION GAP: 12 (ref 5–15)
Albumin: 3.3 g/dL — ABNORMAL LOW (ref 3.5–5.0)
BUN: 21 mg/dL — ABNORMAL HIGH (ref 6–20)
CALCIUM: 8.3 mg/dL — AB (ref 8.9–10.3)
CHLORIDE: 93 mmol/L — AB (ref 101–111)
CO2: 27 mmol/L (ref 22–32)
CREATININE: 5.54 mg/dL — AB (ref 0.61–1.24)
GFR, EST AFRICAN AMERICAN: 11 mL/min — AB (ref >60)
GFR, EST NON AFRICAN AMERICAN: 10 mL/min — AB (ref >60)
Glucose, Bld: 155 mg/dL — ABNORMAL HIGH (ref 65–99)
Phosphorus: 3.5 mg/dL (ref 2.5–4.6)
Potassium: 4.1 mmol/L (ref 3.5–5.1)
Sodium: 132 mmol/L — ABNORMAL LOW (ref 135–145)

## 2017-02-03 LAB — CBC WITH DIFFERENTIAL/PLATELET
BASOS PCT: 0 %
Basophils Absolute: 0 10*3/uL (ref 0.0–0.1)
EOS ABS: 0.2 10*3/uL (ref 0.0–0.7)
Eosinophils Relative: 3 %
HEMATOCRIT: 28.3 % — AB (ref 39.0–52.0)
HEMOGLOBIN: 9.8 g/dL — AB (ref 13.0–17.0)
LYMPHS ABS: 1.5 10*3/uL (ref 0.7–4.0)
Lymphocytes Relative: 19 %
MCH: 35 pg — ABNORMAL HIGH (ref 26.0–34.0)
MCHC: 34.6 g/dL (ref 30.0–36.0)
MCV: 101.1 fL — ABNORMAL HIGH (ref 78.0–100.0)
Monocytes Absolute: 0.9 10*3/uL (ref 0.1–1.0)
Monocytes Relative: 12 %
NEUTROS ABS: 5.3 10*3/uL (ref 1.7–7.7)
NEUTROS PCT: 66 %
Platelets: 310 10*3/uL (ref 150–400)
RBC: 2.8 MIL/uL — AB (ref 4.22–5.81)
RDW: 13.9 % (ref 11.5–15.5)
WBC: 7.9 10*3/uL (ref 4.0–10.5)

## 2017-02-03 LAB — GLUCOSE, CAPILLARY
GLUCOSE-CAPILLARY: 100 mg/dL — AB (ref 65–99)
GLUCOSE-CAPILLARY: 125 mg/dL — AB (ref 65–99)

## 2017-02-03 MED ORDER — HEPARIN SODIUM (PORCINE) 1000 UNIT/ML DIALYSIS: 8600.0000 [IU] | Freq: Once | INTRAMUSCULAR | Status: DC

## 2017-02-03 MED ORDER — CINACALCET HCL 30 MG PO TABS
120.0000 mg | ORAL_TABLET | ORAL | Status: DC
  Administered 2017-02-05 – 2017-02-15 (×5): 120 mg via ORAL
  Filled 2017-02-03 (×5): qty 4

## 2017-02-03 MED ORDER — CALCITRIOL 0.5 MCG PO CAPS
2.0000 ug | ORAL_CAPSULE | ORAL | Status: DC
  Administered 2017-02-05 – 2017-02-08 (×2): 2 ug via ORAL
  Filled 2017-02-03 (×2): qty 4

## 2017-02-03 MED ORDER — ALTEPLASE 2 MG IJ SOLR: 2.0000 mg | Freq: Once | INTRAMUSCULAR | Status: DC | PRN

## 2017-02-03 MED ORDER — INSULIN ASPART 100 UNIT/ML ~~LOC~~ SOLN
0.0000 [IU] | Freq: Three times a day (TID) | SUBCUTANEOUS | Status: DC
  Administered 2017-02-03: 2 [IU] via SUBCUTANEOUS
  Administered 2017-02-04: 3 [IU] via SUBCUTANEOUS
  Administered 2017-02-05 – 2017-02-09 (×7): 2 [IU] via SUBCUTANEOUS

## 2017-02-03 MED ORDER — CALCITRIOL 0.5 MCG PO CAPS
ORAL_CAPSULE | ORAL | Status: AC
  Administered 2017-02-03: 2 ug via ORAL
  Filled 2017-02-03: qty 4

## 2017-02-03 MED ORDER — ATORVASTATIN CALCIUM 20 MG PO TABS: 20.0000 mg | ORAL_TABLET | Freq: Every day | ORAL | Status: DC

## 2017-02-03 MED ORDER — AMLODIPINE BESYLATE 10 MG PO TABS
10.0000 mg | ORAL_TABLET | Freq: Every day | ORAL | Status: DC
  Administered 2017-02-04 – 2017-02-16 (×12): 10 mg via ORAL
  Filled 2017-02-03 (×12): qty 1

## 2017-02-03 MED ORDER — SODIUM CHLORIDE 0.9 % IV SOLN: 100.0000 mL | INTRAVENOUS | Status: DC | PRN

## 2017-02-03 MED ORDER — IPRATROPIUM-ALBUTEROL 0.5-2.5 (3) MG/3ML IN SOLN: 3.0000 mL | RESPIRATORY_TRACT | Status: DC | PRN

## 2017-02-03 MED ORDER — CALCIUM ACETATE (PHOS BINDER) 667 MG PO CAPS
1334.0000 mg | ORAL_CAPSULE | Freq: Three times a day (TID) | ORAL | Status: DC
  Administered 2017-02-04 – 2017-02-16 (×34): 1334 mg via ORAL
  Filled 2017-02-03 (×36): qty 2

## 2017-02-03 MED ORDER — CINACALCET HCL 30 MG PO TABS: ORAL_TABLET | ORAL | Status: DC

## 2017-02-03 MED ORDER — CALCITRIOL 0.5 MCG PO CAPS: 2.0000 ug | ORAL_CAPSULE | ORAL | Status: DC

## 2017-02-03 MED ORDER — FOLIC ACID 1 MG PO TABS
1.0000 mg | ORAL_TABLET | Freq: Every day | ORAL | Status: DC
  Administered 2017-02-04 – 2017-02-16 (×13): 1 mg via ORAL
  Filled 2017-02-03 (×13): qty 1

## 2017-02-03 MED ORDER — LIDOCAINE-PRILOCAINE 2.5-2.5 % EX CREA: 1.0000 "application " | TOPICAL_CREAM | CUTANEOUS | Status: DC | PRN

## 2017-02-03 MED ORDER — HEPARIN SODIUM (PORCINE) 5000 UNIT/ML IJ SOLN
5000.0000 [IU] | Freq: Three times a day (TID) | INTRAMUSCULAR | Status: DC
  Administered 2017-02-03 – 2017-02-16 (×36): 5000 [IU] via SUBCUTANEOUS
  Filled 2017-02-03 (×37): qty 1

## 2017-02-03 MED ORDER — HEPARIN SODIUM (PORCINE) 1000 UNIT/ML DIALYSIS: 1000.0000 [IU] | INTRAMUSCULAR | Status: DC | PRN

## 2017-02-03 MED ORDER — HEPARIN SODIUM (PORCINE) 1000 UNIT/ML DIALYSIS: 4000.0000 [IU] | INTRAMUSCULAR | Status: DC | PRN

## 2017-02-03 MED ORDER — ATORVASTATIN CALCIUM 20 MG PO TABS
20.0000 mg | ORAL_TABLET | Freq: Every day | ORAL | Status: DC
  Administered 2017-02-04 – 2017-02-15 (×11): 20 mg via ORAL
  Filled 2017-02-03 (×11): qty 1

## 2017-02-03 MED ORDER — SORBITOL 70 % SOLN
30.0000 mL | Freq: Every day | Status: DC | PRN
  Administered 2017-02-08 – 2017-02-14 (×4): 30 mL via ORAL
  Filled 2017-02-03 (×4): qty 30

## 2017-02-03 MED ORDER — ONDANSETRON HCL 4 MG PO TABS
4.0000 mg | ORAL_TABLET | Freq: Four times a day (QID) | ORAL | Status: DC | PRN
  Administered 2017-02-06 – 2017-02-11 (×3): 4 mg via ORAL
  Filled 2017-02-03 (×3): qty 1

## 2017-02-03 MED ORDER — PENTAFLUOROPROP-TETRAFLUOROETH EX AERO: 1.0000 "application " | INHALATION_SPRAY | CUTANEOUS | Status: DC | PRN

## 2017-02-03 MED ORDER — ACETAMINOPHEN 325 MG PO TABS
650.0000 mg | ORAL_TABLET | Freq: Four times a day (QID) | ORAL | Status: DC | PRN
  Administered 2017-02-03 – 2017-02-16 (×16): 650 mg via ORAL
  Filled 2017-02-03 (×16): qty 2

## 2017-02-03 MED ORDER — LIDOCAINE HCL (PF) 1 % IJ SOLN: 5.0000 mL | INTRAMUSCULAR | Status: DC | PRN

## 2017-02-03 MED ORDER — PANTOPRAZOLE SODIUM 40 MG PO TBEC
40.0000 mg | DELAYED_RELEASE_TABLET | Freq: Every day | ORAL | Status: DC
  Administered 2017-02-04 – 2017-02-16 (×13): 40 mg via ORAL
  Filled 2017-02-03 (×13): qty 1

## 2017-02-03 MED ORDER — ONDANSETRON HCL 4 MG/2ML IJ SOLN: 4.0000 mg | Freq: Four times a day (QID) | INTRAMUSCULAR | Status: DC | PRN

## 2017-02-03 MED ORDER — TAMSULOSIN HCL 0.4 MG PO CAPS
0.4000 mg | ORAL_CAPSULE | Freq: Every day | ORAL | Status: DC
  Administered 2017-02-04 – 2017-02-15 (×11): 0.4 mg via ORAL
  Filled 2017-02-03 (×11): qty 1

## 2017-02-03 MED ORDER — FOLIC ACID 1 MG PO TABS: 1.0000 mg | ORAL_TABLET | Freq: Every day | ORAL | Status: DC

## 2017-02-03 MED ORDER — METOPROLOL SUCCINATE ER 25 MG PO TB24
12.5000 mg | ORAL_TABLET | ORAL | Status: DC
  Administered 2017-02-04 – 2017-02-16 (×8): 12.5 mg via ORAL
  Filled 2017-02-03 (×8): qty 1

## 2017-02-03 MED ORDER — METOPROLOL SUCCINATE ER 25 MG PO TB24
12.5000 mg | ORAL_TABLET | ORAL | Status: DC
Start: 2017-02-04 — End: 2017-02-16

## 2017-02-03 MED ORDER — HEPARIN SODIUM (PORCINE) 5000 UNIT/ML IJ SOLN: 5000.0000 [IU] | Freq: Three times a day (TID) | INTRAMUSCULAR | Status: DC

## 2017-02-03 MED ORDER — ACETAMINOPHEN 650 MG RE SUPP: 650.0000 mg | Freq: Four times a day (QID) | RECTAL | Status: DC | PRN

## 2017-02-03 NOTE — Progress Notes (Signed)
Patient ID: [name], male   DOB: [birth year], 66 y.o.   MRN: [id number] Patient arrived from [facility] with belongings and RN. Patient oriented to room, rehab process, schedule, fall prevention plan and health resource notebook. Patient resting comfortably in bed with call bell at bed side. Bed alarm on.

## 2017-02-03 NOTE — Progress Notes (Signed)
Patient refused cpap tonight.

## 2017-02-03 NOTE — Progress Notes (Signed)
PT Cancellation Note  Patient Details Name: [name] MRN: [id number] DOB: 1950-06-18   Cancelled Treatment:    Reason Eval/Treat Not Completed: (P) Other (comment)(Pt to d/c to CIR will defer PT needs to next level of care. )   Cristela Blue 02/03/2017, 4:03 PM  Governor Rooks, PTA pager [phone]

## 2017-02-03 NOTE — Discharge Summary (Signed)
Physician Discharge Summary  [name] FGH:[id number] DOB: 1950/03/07  PCP: [facility]  Admit date: 01/20/2017 Discharge date: 02/03/2017  Recommendations for Outpatient Follow-up:  1. PCP at the [facility], upon discharge from [facility] [place]. 2. Hemodialysis Center: Keep regular hemodialysis appointments on Mondays, Wednesdays and Fridays. 3. Dr. Lennette Bihari Haddix, Orthopedics in 2 weeks. [place] on 02/28/17 at 8 PM for outpatient sleep study. 5. During discharge from CIR, patient will need scripts for medications newly started in the hospital or changed prior to discharge. 6. Continue CPAP at CIR per RT and as outpatient until formal sleep study in January. CPAP settings of 12/6  Home Health: None Equipment/Devices: None    Discharge Condition: Improved and stable  CODE STATUS: Full  Diet recommendation: Heart healthy and diabetic diet.  Discharge Diagnoses:  Principal Problem:   Hyperkalemia Active Problems:   Diabetes mellitus (HCC)   End stage renal disease on dialysis (HCC)   SIRS (systemic inflammatory response syndrome) (HCC)   Hypertensive urgency   Closed fracture of distal end of left fibula   Anemia due to chronic kidney disease   Acute metabolic encephalopathy   Abnormal swallowing   Altered mental status   Chronic obstructive pulmonary disease (HCC)   Closed fracture of right distal fibula   Leukocytosis   Hyperlipidemia   Benign essential HTN   Brief Summary: 66 year old male ESRD on HD MWF HTN DM TY 2 HLD prostate Ca s/p prostatectomy, ventral hernia repair fall 12/11 fractured distal left fibula sent home on pain medication follow-up with orthopedics Daughter received a call 12/12 as patient acutely altered missed dialysis-came to emergency room-workup revealed K 6.8 BUN/creatinine 86/14 anion gap 17 CXR negative CT head negative EKG T wave peak Neurology and nephrology consulted Patient inproved and felt to have parasomnias  and undiagnosed OSA leading to confusion around sleep-wakefullness  Assessment & Plan:   Toxic metabolic encephalopathy 2/2 to meds and Uremia-possible undiag OSA and parasomnias - MRI neg, BCx2 negative, pro-calcitonin neg, ammonia negative, EEG generalized irregular - recurrent metabolic encephalopathy 67/89?  Sleepiness 12/18 pm-had some parasomnias 12/20 am - need Sleep study and OP management for this. Has sleep study appointment in January. - Mental status changes seem to have resolved.  ? OSA/Parasomnia versus REM sleep behavior disorder (RBD) - somnolence on 12/18 pm and was difficult to arouse as OP. - does need formal OSA testing split somnogram- has an appointment. - Will need CPAP settings of 12/6 written on d/c for machine - No further parasomnias reported. - Reportedly using CPAP at night. - As per neurology input 12/21: Recommend comprehensive sleep study in the sleep lab including EEG to assess possible parasomnia versus RBD.  ?  Sepsis - cultures negative on empiric Zosyn treating for 7 days since stopping 12/20-white count trend 15-->9.0-->12---vancomycin d/c 12/16 and has resolved without recurrent concerns for sepsis  Hiccups Trial of Thorazine 25 IM x 1 [facility] had seen. Do not see need for continued PPI at this time, discontinued at discharge. Resolved completely  Dysphagia Speech has seen and recommended dysphagia 3 diet Speech therapy reassessed 12/19 and recommended regular diet and thin liquids.  ESRD MWF Hyperkalemia-resolved with treatment calcium gluconate dextrose and with HD. Nephrology follow-up appreciated. Discussed with nephrology and cleared for discharge to CIR.  Anemia of renal disease Stable. Periodically follow CBCs across dialysis.  Secondary hyperparathyroidism Defer to nephrology Continue calcitriol 2 mcg MWF, Sensipar 120  Hypertensive urgency on admission  Continue  amlodipine 10 & toprol xl 12.5 t-th-s-s (nondialysis  days). Volume management across dialysis. Better.  Closed fracture distal end of left fibula secondary to fall Patient was supposed to follow-up with orthopedics as outpatient on 01/22/17 but hospitalized here on 12/12. Orthopedic consultation 12/24 appreciated: Placed in a short-leg cast, nonweightbearing on his left leg for another 3 weeks when able convert him to his cam boot which he has, follow-up x-ray in 2 weeks, follow-up with them in 2 weeks. No pain reported.  Hyperlipidemia Continue Lipitor 20  Macrocytosis-mild leukocytosis Continue b12 If persists will consider work-up with hematology as OP  Type II DM with renal complications - Well controlled in the hospital. Diet-controlled and should not need insulin's. However continue to monitor CBGs periodically.   Consultants:   Nephrology  Rehabilitation M.D.  Orthopedics.  Procedures:   HD    Discharge Instructions  Discharge Instructions    (HEART FAILURE PATIENTS) Call MD:  Anytime you have any of the following symptoms: 1) 3 pound weight gain in 24 hours or 5 pounds in 1 week 2) shortness of breath, with or without a dry hacking cough 3) swelling in the hands, feet or stomach 4) if you have to sleep on extra pillows at night in order to breathe.   Complete by:  As directed    Call MD for:  difficulty breathing, headache or visual disturbances   Complete by:  As directed    Call MD for:  extreme fatigue   Complete by:  As directed    Call MD for:  persistant dizziness or light-headedness   Complete by:  As directed    Call MD for:  severe uncontrolled pain   Complete by:  As directed    Diet - low sodium heart healthy   Complete by:  As directed    Diet Carb Modified   Complete by:  As directed    Increase activity slowly   Complete by:  As directed        Medication List    STOP taking these medications   furosemide 20 MG tablet Commonly known as:  LASIX   HYDROcodone-acetaminophen 5-325 MG  tablet Commonly known as:  NORCO/VICODIN   lisinopril 40 MG tablet Commonly known as:  PRINIVIL,ZESTRIL   naproxen sodium 220 MG tablet Commonly known as:  ALEVE   VELPHORO 500 MG chewable tablet Generic drug:  sucroferric oxyhydroxide   Vitamin D (Ergocalciferol) 50000 units Caps capsule Commonly known as:  DRISDOL     TAKE these medications   acetaminophen 500 MG tablet Commonly known as:  TYLENOL Take 1 tablet (500 mg total) by mouth every 6 (six) hours as needed for mild pain or moderate pain.   albuterol 108 (90 Base) MCG/ACT inhaler Commonly known as:  PROVENTIL HFA;VENTOLIN HFA Inhale 1-2 puffs into the lungs every 6 (six) hours as needed for wheezing.   amLODipine 10 MG tablet Commonly known as:  NORVASC Take 10 mg daily by mouth.   atorvastatin 20 MG tablet Commonly known as:  LIPITOR Take 1 tablet (20 mg total) by mouth daily at 6 PM. What changed:    how much to take  when to take this   calcitRIOL 0.5 MCG capsule Commonly known as:  ROCALTROL Take 4 capsules (2 mcg total) by mouth every Monday, Wednesday, and Friday. Start taking on:  02/05/2017   calcium acetate 667 MG capsule Commonly known as:  PHOSLO Take 1,334 mg by mouth 3 (three) times daily.   cinacalcet  30 MG tablet Commonly known as:  SENSIPAR 120 mg, Oral, Custom (Once per day on Mon Wed Fri),   folic acid 1 MG tablet Commonly known as:  FOLVITE Take 1 tablet (1 mg total) by mouth daily.   metoprolol succinate 25 MG 24 hr tablet Commonly known as:  TOPROL-XL Take 0.5 tablets (12.5 mg total) by mouth every Tuesday, Thursday, Saturday, and Sunday. Start taking on:  02/04/2017   tamsulosin 0.4 MG Caps capsule Commonly known as:  FLOMAX Take 0.4 mg by mouth daily.      Follow-up Information    [facility] Follow up on 02/28/2017.   Why:  at 8 pm. Please try to keep your apt or call to rescedule for your Outpatient Sleep Study Contact information: [address] [place], [place] an appointment as soon as possible for a visit.   Specialty:  General Practice Why:  Upon discharge from [facility] information: [place] [address] [phone]        Shona Needles, MD. Schedule an appointment as soon as possible for a visit in 2 week(s).   Specialty:  Orthopedic Surgery Contact information: [address] [place] [address] [phone]        Hemodialysis Center Follow up.   Why:  Continue regular hemodialysis appointments on Mondays, Wednesdays and Saturdays.         Allergies  Allergen Reactions  . Pravastatin Other (See Comments)    MYALGIAS, MUSCLE PAIN  . Simvastatin Other (See Comments)    MYALGIAS, MUSCLE PAIN      Procedures/Studies: Dg Ankle Complete Left  Result Date: 02/01/2017 CLINICAL DATA:  Left fibular fracture. EXAM: LEFT ANKLE COMPLETE - 3+ VIEW COMPARISON:  Radiographs of January 19, 2017. FINDINGS: The left ankle has been splinted and immobilized. Stable appearance of nondisplaced distal left fibular fracture is noted, best seen on lateral projection. No other fracture is noted. Joint spaces are intact. IMPRESSION: Stable nondisplaced distal left fibular fracture. Electronically Signed   By: Marijo Conception, M.D.   On: 02/01/2017 10:06   Dg Ankle Complete Left  Result Date: 01/19/2017 CLINICAL DATA:  Pain secondary to a fall today. EXAM: LEFT ANKLE COMPLETE - 3+ VIEW COMPARISON:  None. FINDINGS: There appears to be a fracture of the anterior cortex of the distal fibula and there is loss of the cortical margin of the anterior aspect of the distal tibia. Possible tiny avulsion from the tip of the medial malleolus. There is soft tissue swelling over the lateral malleolus. IMPRESSION: 1. Probable nondisplaced fracture of the distal fibular shaft. 2. Possible avulsion  fracture of the anterior aspect of the distal tibia. CT scan may be useful for further evaluation. Electronically Signed   By: Lorriane Shire M.D.   On: 01/19/2017 10:54   Ct Head Wo Contrast  Result Date: 01/24/2017 CLINICAL DATA:  Initial evaluation for acute altered mental status. EXAM: CT HEAD WITHOUT CONTRAST TECHNIQUE: Contiguous axial images were obtained from the base of the skull through the vertex without intravenous contrast. COMPARISON:  Prior MRI from 01/21/2017. FINDINGS: Brain: Atrophy with chronic small vessel ischemic disease. Remote lacunar infarct present within the right basal ganglia. No acute intracranial hemorrhage. No evidence for acute large vessel territory infarct. No mass lesion, midline shift or mass effect. No hydrocephalus. No extra-axial fluid collection. Vascular: No  hyperdense vessel. Scattered vascular calcifications noted within the carotid siphons. Skull: Scalp soft tissues and calvarium within normal limits. Sinuses/Orbits: Globes and orbital soft tissues within normal limits. Paranasal sinuses and mastoid air cells are clear. Other: None. IMPRESSION: 1. No acute intracranial abnormality. 2. Moderate atrophy with chronic small vessel ischemic disease, stable. Electronically Signed   By: Jeannine Boga M.D.   On: 01/24/2017 06:22   Ct Head Wo Contrast  Result Date: 01/20/2017 CLINICAL DATA:  Altered mental status, onset today. Patient not following commands. EXAM: CT HEAD WITHOUT CONTRAST TECHNIQUE: Contiguous axial images were obtained from the base of the skull through the vertex without intravenous contrast. COMPARISON:  None. FINDINGS: Brain: No evidence for acute infarction, hemorrhage, mass lesion, hydrocephalus, or extra-axial fluid. Advanced cerebral and cerebellar atrophy, premature for age. Hypoattenuation of white matter, favored to represent small vessel disease. Vascular: Calcification of the cavernous internal carotid arteries consistent with  cerebrovascular atherosclerotic disease. No signs of intracranial large vessel occlusion. Skull: Calvarium intact. Sinuses/Orbits: No sinus disease.  Dense lenticular opacities. Other: None. IMPRESSION: Atrophy and small vessel disease. No acute intracranial findings. No cause is seen for the reported symptoms. Electronically Signed   By: Staci Righter M.D.   On: 01/20/2017 20:03   Mr Brain Wo Contrast  Result Date: 01/21/2017 CLINICAL DATA:  Altered mental status. End-stage renal disease on dialysis. Fall with distal fibula fracture 2 days ago. EXAM: MRI HEAD WITHOUT CONTRAST TECHNIQUE: Multiplanar, multiecho pulse sequences of the brain and surrounding structures were obtained without intravenous contrast. COMPARISON:  Head CT 01/20/2017 FINDINGS: There is up to moderate motion artifact throughout the examination. Brain: There is no evidence of acute infarct, intracranial hemorrhage, mass, midline shift, or extra-axial fluid collection. Patchy T2 hyperintensities throughout the subcortical and deep cerebral white matter are nonspecific but compatible with moderate chronic small vessel ischemic disease. There is mild cerebral atrophy. Vascular: Major intracranial vascular flow voids are preserved. Skull and upper cervical spine: Unremarkable bone marrow signal. Sinuses/Orbits: Unremarkable orbits. Paranasal sinuses and mastoid air cells are clear. Other: None. IMPRESSION: 1. No acute intracranial abnormality. 2. Moderate chronic small vessel ischemic disease. Electronically Signed   By: Logan Bores M.D.   On: 01/21/2017 12:11   Dg Chest Portable 1 View  Result Date: 01/20/2017 CLINICAL DATA:  Altered mental status. Patient found lying on floor at home today pannus nonverbal to questions. Cough. EXAM: PORTABLE CHEST 1 VIEW COMPARISON:  03/02/2016 FINDINGS: Stable cardiomegaly. Dialysis catheter tip from a right IJ approach is noted in the proximal right atrium. No overt pulmonary edema. The costophrenic  angles are excluded on this study but no significant pleural effusion is seen. There is no pneumothorax. No pneumonic consolidation. No acute osseous abnormality. 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 IMPRESSION: No active disease. Electronically Signed   By: Ashley Royalty M.D.   On: 01/20/2017 18:39   Dg Knee Left Port  Result Date: 02/01/2017 CLINICAL DATA:  Pain after fall EXAM: PORTABLE LEFT KNEE - 1-2 VIEW COMPARISON:  August 31, 2007 FINDINGS: Plate and screws are affixed to the proximal lateral tibia. Deformity of the proximal lateral tibia is at the site of a remote tibial plateau fracture. The proximal tibia is otherwise normal. The proximal fibula is normal. The distal femur is normal. No joint effusion or lipohemarthrosis. A calcification in the soft tissues medial to the distal femur are likely associated with a remote medial collateral ligament injury. IMPRESSION: 1. Deformity of the proximal medial tibia  is at the site of a previous tibial plateau fracture. Plate and screws cross this region. The chronicity of the deformity is unclear. The lack of a joint effusion or lipohemarthrosis is somewhat reassuring. However, the sharp margins at the site of deformity and step-off make it difficult to exclude an acute on chronic fracture in this location. Electronically Signed   By: Dorise Bullion III M.D   On: 02/01/2017 13:26   Dg Ankle Left Port  Result Date: 02/01/2017 CLINICAL DATA:  Fall.  Evaluate for ligament damage. EXAM: PORTABLE LEFT ANKLE - 2 VIEW COMPARISON:  Standard films from earlier today FINDINGS: Soft tissue swelling is seen laterally. The known distal left fibular fractures identified. No displacement on this stressed image. The ankle mortise remains intact. IMPRESSION: No change on stress images.  Known distal fibular fracture. Electronically Signed   By: Dorise Bullion III M.D   On: 02/01/2017 13:28      Subjective: Seen this morning prior to  dialysis. Denies complaints. Reported using CPAP at night. No pain reported in left leg. No chest pain, dyspnea, palpitations. No dizziness or lightheadedness. As per RN, no acute issues noted.  Discharge Exam:  Vitals:   02/03/17 1130 02/03/17 1200 02/03/17 1230 02/03/17 1300  BP: (!) 147/81 (!) 155/82 (!) 159/82 (!) 162/78  Pulse: 85 87 85 88  Resp: 17 18 17 16   Temp:    98.4 F (36.9 C)  TempSrc:    Oral  SpO2:    98%  Weight:    88.7 kg (195 lb 8.8 oz)  Height:        Gen. exam: Pleasant middle-aged male, moderately built and nourished, lying comfortably in bed.  Respiratory system: Clear to auscultation. No increased work of breathing.  Cardiovascular system: S1 and S2 heard, RRR. No JVD, murmurs or pedal edema. Telemetry: Personally reviewed and shows sinus rhythm.  Abdominal exam: Nondistended, soft and nontender. Normal bowel sounds heard. CNS: Alert and oriented. No focal neurological deficits.  Extremities: Left leg cast intact.  Psychiatry: Mood appears flat but responds appropriately.      The results of significant diagnostics from this hospitalization (including imaging, microbiology, ancillary and laboratory) are listed below for reference.      Labs: CBC: Recent Labs  Lab 01/28/17 0532 01/29/17 0314 01/31/17 0955 02/03/17 1432  WBC 12.2* 11.3* 9.7 7.9  NEUTROABS 8.4* 7.8*  --  5.3  HGB 10.0* 10.6* 9.8* 9.8*  HCT 30.7* 31.1* 29.0* 28.3*  MCV 104.1* 104.4* 102.8* 101.1*  PLT 247 272 274 161   Basic Metabolic Panel: Recent Labs  Lab 01/29/17 0314 01/31/17 0955 02/03/17 1432  NA 135  133* 131* 132*  K 4.4  4.6 4.0 4.1  CL 99*  98* 93* 93*  CO2 24  22 25 27   GLUCOSE 97  90 104* 155*  BUN 34*  37* 46* 21*  CREATININE 8.86*  8.66* 8.86* 5.54*  CALCIUM 9.1  8.9 8.8* 8.3*  PHOS 5.9* 5.0* 3.5   Liver Function Tests: Recent Labs  Lab 01/29/17 0314 01/31/17 0955 02/03/17 1432  ALBUMIN 3.5 3.3* 3.3*   CBG: Recent Labs  Lab  02/02/17 0653 02/02/17 1109 02/02/17 1719 02/02/17 2105 02/03/17 0614  GLUCAP 109* 128* 153* 155* 100*       Time coordinating discharge: Over 30 minutes  SIGNED:  Vernell Leep, MD, FACP, [facility]. [facility] Pager [phone]  If 7PM-7AM, please contact night-coverage [email or web address] Password [facility] 02/03/2017, 3:32 PM

## 2017-02-03 NOTE — Procedures (Signed)
Pt stable, awaiting CIR admission.    I was present at this dialysis session, have reviewed the session itself and made  appropriate changes Kelly Splinter MD [place] pager [phone]   02/03/2017, 10:24 AM
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
## 2017-02-03 NOTE — Discharge Instructions (Signed)

## 2017-02-03 NOTE — Progress Notes (Signed)
Rehab admissions - Patient is currently in HD.  Per attending MD and nephrology, patient is ready for CIR.  Bed available and will admit to acute inpatient rehab today after HD is completed.  Call me for questions.  #[phone]

## 2017-02-04 ENCOUNTER — Inpatient Hospital Stay (HOSPITAL_COMMUNITY): Payer: Medicare Other | Admitting: Speech Pathology

## 2017-02-04 ENCOUNTER — Inpatient Hospital Stay (HOSPITAL_COMMUNITY): Payer: Medicare Other

## 2017-02-04 ENCOUNTER — Inpatient Hospital Stay (HOSPITAL_COMMUNITY): Payer: Medicare Other | Admitting: Occupational Therapy

## 2017-02-04 DIAGNOSIS — E1169 Type 2 diabetes mellitus with other specified complication: Secondary | ICD-10-CM

## 2017-02-04 DIAGNOSIS — N186 End stage renal disease: Secondary | ICD-10-CM

## 2017-02-04 DIAGNOSIS — E669 Obesity, unspecified: Secondary | ICD-10-CM

## 2017-02-04 DIAGNOSIS — D638 Anemia in other chronic diseases classified elsewhere: Secondary | ICD-10-CM

## 2017-02-04 DIAGNOSIS — I10 Essential (primary) hypertension: Secondary | ICD-10-CM

## 2017-02-04 DIAGNOSIS — Z992 Dependence on renal dialysis: Secondary | ICD-10-CM

## 2017-02-04 LAB — GLUCOSE, CAPILLARY
GLUCOSE-CAPILLARY: 158 mg/dL — AB (ref 65–99)
Glucose-Capillary: 137 mg/dL — ABNORMAL HIGH (ref 65–99)
Glucose-Capillary: 103 mg/dL — ABNORMAL HIGH (ref 65–99)
Glucose-Capillary: 112 mg/dL — ABNORMAL HIGH (ref 65–99)
Glucose-Capillary: 98 mg/dL (ref 65–99)

## 2017-02-04 MED ORDER — DARBEPOETIN ALFA 60 MCG/0.3ML IJ SOSY
60.0000 ug | PREFILLED_SYRINGE | INTRAMUSCULAR | Status: DC
  Administered 2017-02-05 – 2017-02-12 (×2): 60 ug via INTRAVENOUS
  Filled 2017-02-04 (×2): qty 0.3

## 2017-02-04 NOTE — Progress Notes (Signed)
PMR Admission Coordinator Pre-Admission Assessment  Patient: [name] is an 66 y.o., male MRN: [id number] DOB: [birth year] Height: 5\' 8"  (172.7 cm) Weight: 90.5 kg (199 lb 8.3 oz)                                                                                                                                                  Insurance Information HMO:     PPO:      PCP:      IPA:      80/20:      OTHER:  PRIMARY: Medicare A & B      Policy#: [id number]      Subscriber: Self CM Name:       Phone#:      Fax#:  Pre-Cert#: Eligible       Employer: Retired  Benefits:  Phone #: Verified online      Name: Passport One Eff. Date: 11/10/07     Deduct: $1340      Out of Pocket Max: N/A      Life Max: N/A CIR: 100%      SNF: 100% days 1-20; 80% days 21-100 Outpatient: 80%     Co-Pay: 20% Home Health: 100%      Co-Pay: none DME: 80%     Co-Pay: 20% Providers: Patient's Choice   SECONDARY: BCBS State     Policy#: [id number]      Subscriber: Self CM Name:       Phone#:      Fax#:  Pre-Cert#:       Employer:  Benefits:  Phone #: [phone]     Name:  Eff. Date:      Deduct:       Out of Pocket Max:       Life Max:  CIR:       SNF:  Outpatient:      Co-Pay:  Home Health:       Co-Pay:  DME:      Co-Pay:   Medicaid Application Date:       Case Manager:  Disability Application Date:       Case Worker:   Emergency Contact Information        Contact Information    Name Relation Home Work Mobile   [place],Monique Daughter [phone]       Current Medical History  Patient Admitting Diagnosis: Metabolic encephalopathy  History of Present Illness: [name] a 66 y.o.malewith past medical history of ESRD on HD, HTN, DM, HLD, COPD, GERD, tobacco abuse, recent left fibula fracture presented on 09/32 with metabolic encephalopathy. Per chart review and patientlives alone independent prior to admission. One level home.,the previous day he was noted to have a left fibular fracture  after a fall. He was casted in discharged home with Tylenol. The next day, he did not present for  his dialysis session. His daughter went to check on him and found him unresponsive. Patient was brought to the ED and noted to be tachycardic, tachypneic with hypertension, hyperkalemia history of present illness.Ammonia level within normal limits.CT/MRIof the head was performed, reviewed unremarkable for acute process. Neurology was consulted due to altered mental status. MRI was ordered due to concerns of PRES,but patient was not able to lie still initially. There was concern for sepsis and broad-spectrum antibiotics were started, but since have been DC'd. Hospital course further complicated by leukocytosis and anemia of chronic disease.Subcutaneous heparin for DVT prophylaxis. Hemodialysis ongoing as per renal services. Tolerating a regular consistency diet. Patient remains nonweightbearing left lower extremity Physical and occupational therapy evaluations completed with recommendations of physical medicine rehabilitation consult. Patient to be admitted for a comprehensive inpatient rehabilitation program.  Past Medical History      Past Medical History:  Diagnosis Date  . Arthritis    right hand  . Cancer [facility])    prostate  . Chronic kidney disease    Dialysis M/w/F, [facility]  . COPD (chronic obstructive pulmonary disease) ([place])   . Diabetes mellitus    Type 2   . GERD (gastroesophageal reflux disease)   . High cholesterol   . Hypertension   . Shortness of breath dyspnea     Family History  family history includes CAD in his father; Heart failure in his mother.  Prior Rehab/Hospitalizations:  Has the patient had major surgery during 100 days prior to admission? Yes  Current Medications   Current Facility-Administered Medications:  .  0.9 %  sodium chloride infusion, 100 mL, Intravenous, PRN, Joelyn Oms, Ryan B, MD .  0.9 %  sodium chloride infusion, 100 mL,  Intravenous, PRN, Joelyn Oms, Ryan B, MD .  0.9 %  sodium chloride infusion, 100 mL, Intravenous, PRN, Roney Jaffe, MD .  0.9 %  sodium chloride infusion, 100 mL, Intravenous, PRN, Roney Jaffe, MD .  acetaminophen (TYLENOL) tablet 650 mg, 650 mg, Oral, Q6H PRN, 650 mg at 02/03/17 0721 **OR** acetaminophen (TYLENOL) suppository 650 mg, 650 mg, Rectal, Q6H PRN, Smith, Rondell A, MD .  alteplase (CATHFLO ACTIVASE) injection 2 mg, 2 mg, Intracatheter, Once PRN, Roney Jaffe, MD .  alteplase (CATHFLO ACTIVASE) injection 4 mg, 4 mg, Intracatheter, Once, Dwana Melena, MD .  amLODipine (NORVASC) tablet 10 mg, 10 mg, Oral, Daily, Alma Friendly, MD, 10 mg at 02/02/17 3235 .  atorvastatin (LIPITOR) tablet 20 mg, 20 mg, Oral, q1800, Alma Friendly, MD, 20 mg at 02/02/17 1830 .  calcitRIOL (ROCALTROL) capsule 2 mcg, 2 mcg, Oral, Q M,W,F, Dwana Melena, MD, 2 mcg at 01/31/17 [phone] .  calcium acetate (PHOSLO) capsule 1,334 mg, 1,334 mg, Oral, TID WC, Alma Friendly, MD, 1,334 mg at 02/02/17 1829 .  cinacalcet (SENSIPAR) tablet 120 mg, 120 mg, Oral, Once per day on Mon Wed Fri, Lin, James W, MD, 120 mg at 01/27/17 2025 .  folic acid (FOLVITE) tablet 1 mg, 1 mg, Oral, Daily, Alma Friendly, MD, 1 mg at 02/02/17 4270 .  heparin injection 1,000 Units, 1,000 Units, Dialysis, PRN, Roney Jaffe, MD .  Derrill Memo ON 02/04/2017] heparin injection 4,000 Units, 4,000 Units, Dialysis, PRN, Roney Jaffe, MD .  heparin injection 5,000 Units, 5,000 Units, Subcutaneous, Q8H, Fuller Plan A, MD, 5,000 Units at 02/03/17 6237 .  heparin injection 8,600 Units, 8,600 Units, Dialysis, Once in dialysis, Pearson Grippe B, MD .  heparin injection 8,600 Units, 8,600 Units, Dialysis, Once  in dialysis, Pearson Grippe B, MD .  hydrALAZINE (APRESOLINE) injection 10 mg, 10 mg, Intravenous, Q4H PRN, Fuller Plan A, MD, 10 mg at 01/22/17 1054 .  insulin aspart (novoLOG) injection 0-15 Units, 0-15 Units,  Subcutaneous, TID AC & HS, Alma Friendly, MD, 3 Units at 02/02/17 2313 .  ipratropium-albuterol (DUONEB) 0.5-2.5 (3) MG/3ML nebulizer solution 3 mL, 3 mL, Nebulization, Q4H PRN, Smith, Rondell A, MD .  lidocaine (PF) (XYLOCAINE) 1 % injection 5 mL, 5 mL, Intradermal, PRN, Roney Jaffe, MD .  lidocaine-prilocaine (EMLA) cream 1 application, 1 application, Topical, PRN, Roney Jaffe, MD .  metoprolol succinate (TOPROL-XL) 24 hr tablet 12.5 mg, 12.5 mg, Oral, Q T,Th,S,Su, Samtani, Jai-Gurmukh, MD, 12.5 mg at 02/02/17 0939 .  ondansetron (ZOFRAN) tablet 4 mg, 4 mg, Oral, Q6H PRN **OR** ondansetron (ZOFRAN) injection 4 mg, 4 mg, Intravenous, Q6H PRN, Fuller Plan A, MD, 4 mg at 01/25/17 1904 .  pantoprazole (PROTONIX) EC tablet 40 mg, 40 mg, Oral, Daily, Alma Friendly, MD, 40 mg at 02/02/17 5277 .  pentafluoroprop-tetrafluoroeth (GEBAUERS) aerosol 1 application, 1 application, Topical, PRN, Roney Jaffe, MD .  tamsulosin [facility]) capsule 0.4 mg, 0.4 mg, Oral, QPC supper, Alma Friendly, MD, 0.4 mg at 02/02/17 1830  Patients Current Diet: Diet Carb Modified Fluid consistency: Thin; Room service appropriate? Yes  Precautions / Restrictions Precautions Precautions: Fall Precaution Comments: Per initial ED note, pt is to be NWB and follow up with ortho in office  Other Brace/Splint: Short leg splint/CAM boot order Restrictions Weight Bearing Restrictions: Yes LLE Weight Bearing: Non weight bearing Other Position/Activity Restrictions: Per initial ED note, pt to be NWB and follow up with ortho in office    Has the patient had 2 or more falls or a fall with injury in the past year?Yes  Prior Activity Level Community (5-7x/wk): Prior to recent left fibula fracture paitent was fully independent and driving himslef to HD: M,W,F.  Home Assistive Devices / Equipment Home Assistive Devices/Equipment: None Home Equipment: None  Prior Device Use: Indicate devices/aids  used by the patient prior to current illness, exacerbation or injury? None of the above  Prior Functional Level Prior Function Level of Independence: Independent Comments: drove himself to dialysis  Self Care: Did the patient need help bathing, dressing, using the toilet or eating? Independent  Indoor Mobility: Did the patient need assistance with walking from room to room (with or without device)? Independent  Stairs: Did the patient need assistance with internal or external stairs (with or without device)? Independent  Functional Cognition: Did the patient need help planning regular tasks such as shopping or remembering to take medications? Independent  Current Functional Level Cognition  Overall Cognitive Status: No family/caregiver present to determine baseline cognitive functioning Orientation Level: Oriented X4 General Comments: Pt with very poor safety awareness and argumenative when attempting to re-educate concerning L LE NWB. Decreased ability to follow commands or problem solve during toileting tasks.     Extremity Assessment (includes Sensation/Coordination)  Upper Extremity Assessment: Overall WFL for tasks assessed  Lower Extremity Assessment: Defer to PT evaluation, LLE deficits/detail(cast; fibular fx)    ADLs  Overall ADL's : Needs assistance/impaired Grooming: Set up, Sitting Upper Body Bathing: Set up, Sitting Lower Body Bathing: Sit to/from stand, Minimal assistance Lower Body Bathing Details (indicate cue type and reason): attempted to keep pt NWB Upper Body Dressing : Set up, Sitting Lower Body Dressing: Minimal assistance, Sit to/from stand Lower Body Dressing Details (indicate cue type and reason): assist  to steady  Toilet Transfer: Min guard, Stand-pivot, RW, [facility] Toilet Transfer Details (indicate cue type and reason): Min guard assist for safety and VC's for NWB. Pt received attempting to stand on his own and unable to retrieve knee walker. Also  need to clarify with MD if able to WB through knee Toileting- Clothing Manipulation and Hygiene: Min guard, Sit to/from stand Toileting - Clothing Manipulation Details (indicate cue type and reason): Constant cueing for L LE NWB Functional mobility during ADLs: Minimal assistance, Rolling walker, Cueing for safety General ADL Comments: Pt requires assist to steady, espeically as he fatigues     Mobility  Overal bed mobility: Modified Independent Bed Mobility: Supine to Sit Supine to sit: Supervision Sit to supine: Min guard General bed mobility comments: OOB in chair on my arrival.     Transfers  Overall transfer level: Needs assistance Equipment used: Rolling walker (2 wheeled)(need clarification if knee walker safe for injury) Transfers: Sit to/from Stand Sit to Stand: Min guard Stand pivot transfers: Min guard General transfer comment: VC's for safe hand placement. Near constant cues to maintain L LE NWB. Pt received attempting to stand on his own and unable to retrieve knee walker prior to needing to assist with transfer to [facility]. Also would like to clarify if knee walker safe for injury.     Ambulation / Gait / Stairs / Wheelchair Mobility  Ambulation/Gait Ambulation/Gait assistance: Museum/gallery curator (Feet): 30 Feet Assistive device: Rolling walker (2 wheeled) Gait Pattern/deviations: Step-to pattern, Decreased stride length(hop to) General Gait Details: During last 10 ft of Gait training patient began to demonstrate touch down weight bearing.  Pt required cues for sequencing and to keep RW close to improve safety with hop to pattern.  Pt remains with poor safety awareness and decreased awareness of deficits.   Gait velocity: decreased Gait velocity interpretation: Below normal speed for age/gender    Posture / Balance Balance Overall balance assessment: Needs assistance, History of Falls Sitting-balance support: No upper extremity supported, Feet  supported Sitting balance-Leahy Scale: Normal Standing balance support: Bilateral upper extremity supported, Single extremity supported, During functional activity Standing balance-Leahy Scale: Poor Standing balance comment: Relies on at least single UE support    Special needs/care consideration BiPAP/CPAP: Yes, BiPAP CPM: No Continuous Drip IV: No Dialysis: Yes        Days: M,W,F at [place]: No Oxygen: No Special Bed: No Trach Size: No Wound Vac (area): No       Skin: Dry, intact                                Bowel mgmt: Continent, last BM 12/25  Bladder mgmt: Continent, oliguria  Diabetic mgmt: Yes, with CBG checks, oral meds, and diet PTA    Previous Home Environment Living Arrangements: Alone Available Help at Discharge: Family, Available 24 hours/day Type of Home: House Home Layout: (unable to obtain complete PLOF history due to urgency of attempting BM ) Home Access: Level entry Bathroom Shower/Tub: Tub/shower unit, Architectural technologist: Standard Bathroom Accessibility: Yes How Accessible: Accessible via walker Home Care Services: No Additional Comments: PLOF information taken from prior chart review   Discharge Living Setting Plans for Discharge Living Setting: Patient's home, Other (Comment)(Daughter, Monique to stay with patient and assist after disc) Type of Home at Discharge: House Discharge Home Layout: One level Discharge Home Access: Stairs to enter Entrance Stairs-Rails: None Entrance [employer]  of Steps: 2-3 Discharge Bathroom Shower/Tub: Tub/shower unit Discharge Bathroom Toilet: Standard Discharge Bathroom Accessibility: Yes How Accessible: Accessible via walker Does the patient have any problems obtaining your medications?: No  Social/Family/Support Systems Patient Roles: Parent Contact Information: Daughter: Beckie Busing [phone] Anticipated Caregiver: Daughter  Anticipated Caregiver's Contact Information: see above   Ability/Limitations of Caregiver: None per her report  Caregiver Availability: 24/7 Discharge Plan Discussed with Primary Caregiver: Yes Is Caregiver In Agreement with Plan?: Yes Does Caregiver/Family have Issues with Lodging/Transportation while Pt is in Rehab?: No  Goals/Additional Needs Patient/Family Goal for Rehab: PT/OT/SLP: Mod I-Supervision  Expected length of stay: 4-8 days  Cultural Considerations: None Dietary Needs: Carb. Mod. diet restrictions  Equipment Needs: TBD Additional Information: Difficulty with weight bearing restrictions  Pt/Family Agrees to Admission and willing to participate: Yes Program Orientation Provided & Reviewed with Pt/Caregiver Including Roles  & Responsibilities: Yes Additional Information Needs: Patient connected with HD at [facility] M,W,F schedule  Information Needs to be Provided By: Team FYI  Decrease burden of Care through IP rehab admission: No  Possible need for SNF placement upon discharge: No  Patient Condition: This patient's medical and functional status has changed since the consult dated: 01/30/17 in which the Rehabilitation Physician determined and documented that the patient's condition is appropriate for intensive rehabilitative care in an inpatient rehabilitation facility. See "History of Present Illness" (above) for medical update. Functional changes are: Currently requiring min assist to ambulate 30 feet RW. Patient's medical and functional status update has been discussed with the Rehabilitation physician and patient remains appropriate for inpatient rehabilitation. Will admit to inpatient rehab today.  Preadmission Screen Completed By:  Gunnar Fusi, with brief updates by Karene Fry, RN 02/03/2017 11:53 AM ______________________________________________________________________   Discussed status with Dr. Posey Pronto on 02/03/17 at 1151 and received telephone approval for admission today.  Admission Coordinator:  Retta Diones, time 1151/Date 02/03/17             Cosigned by: Jamse Arn, MD at 02/03/2017 12:04 PM  Revision History

## 2017-02-04 NOTE — IPOC Note (Signed)
Overall Plan of Care [facility]) Patient Details Name: [name] MRN: [id number] DOB: Dec 30, 1950  Admitting Diagnosis: Metabolic encephalopathy  Hospital Problems: Active Problems:   Metabolic encephalopathy   ESRD on dialysis ([place])   Anemia of chronic disease   Diabetes mellitus type 2 in obese [facility])     Functional Problem List: Nursing Endurance, Medication Management, Pain, Safety, Skin Integrity, Bowel, Bladder  PT Balance, Behavior, Endurance, Pain, Safety  OT Balance, Safety, Cognition, Endurance, Pain  SLP Cognition  TR         Basic ADL's: OT Grooming, Bathing, Dressing, Toileting     Advanced  ADL's: OT Simple Meal Preparation, Laundry     Transfers: PT Bed Mobility, Bed to Chair, Musician, Chief Operating Officer: PT Ambulation, Emergency planning/management officer, Stairs     Additional Impairments: OT None  SLP Social Cognition   Social Interaction, Problem Solving, Awareness, Attention, Memory  TR      Anticipated Outcomes Item Anticipated Outcome  Self Feeding n/a  Swallowing      Basic self-care  supervision  Toileting  supervision   Bathroom Transfers supervision  Bowel/Bladder  Supervision  Transfers  supervision basic and min assist car  Locomotion  supervision w/c x 150' in controlled setting, and gait x 50' with LRAD  Communication     Cognition  Supervision   Pain  <3   Safety/Judgment  Min assist with appropriate assistive device   Therapy Plan: PT Intensity: Minimum of 1-2 x/day ,45 to 90 minutes PT Frequency: 5 out of 7 days PT Duration Estimated Length of Stay: 10-14 OT Intensity: Minimum of 1-2 x/day, 45 to 90 minutes OT Frequency: 5 out of 7 days OT Duration/Estimated Length of Stay: 10-14 days SLP Intensity: Minumum of 1-2 x/day, 30 to 90 minutes SLP Frequency: 3 to 5 out of 7 days SLP Duration/Estimated Length of Stay: 10-14 days     Team Interventions: Nursing Interventions Patient/Family Education, Bowel Management,  Bladder Management, Pain Management, Skin Care/Wound Management, Medication Management, Discharge Planning  PT interventions Ambulation/gait training, Balance/vestibular training, Cognitive remediation/compensation, Discharge planning, Community reintegration, DME/adaptive equipment instruction, Functional mobility training, Patient/family education, Pain management, Neuromuscular re-education, Psychosocial support, Splinting/orthotics, Therapeutic Exercise, Therapeutic Activities, UE/LE Strength taining/ROM, UE/LE Coordination activities, Visual/perceptual remediation/compensation, Wheelchair propulsion/positioning  OT Interventions Balance/vestibular training, Discharge planning, Pain management, Self Care/advanced ADL retraining, Therapeutic Activities, UE/LE Coordination activities, Therapeutic Exercise, Patient/family education, Functional mobility training, Cognitive remediation/compensation, Community reintegration, DME/adaptive equipment instruction, Psychosocial support, UE/LE Strength taining/ROM  SLP Interventions Cognitive remediation/compensation, Internal/external aids, Therapeutic Activities, Environmental controls, Cueing hierarchy, Functional tasks, Patient/family education  TR Interventions    SW/CM Interventions Discharge Planning, Psychosocial Support, Patient/Family Education   Barriers to Discharge MD  Medical stability, Hemodialysis and Weight bearing restrictions  Nursing Medical stability, Home environment access/layout, Lack of/limited family support, Decreased caregiver support, Weight bearing restrictions, Medication compliance    PT      OT Lack of/limited family support    SLP      SW       Team Discharge Planning: Destination: PT-Home ,OT- Home , SLP-Home Projected Follow-up: PT-24 hour supervision/assistance, Home health PT(dtr Monique to live with pt), OT-  24 hour supervision/assistance, Outpatient OT, SLP-24 hour supervision/assistance, Home Health  SLP Projected Equipment Needs: PT-To be determined, OT- Tub/shower bench, To be determined, SLP-None recommended by SLP Equipment Details: PT- , OT-  Patient/family involved in discharge planning: PT- Patient,  OT-Patient, SLP-Patient  MD ELOS: 10-14 days. Medical Rehab Prognosis:  Excellent Assessment: 66 y.o.malewith past medical history of ESRD on HD, HTN, DM, HLD, COPD, GERD, tobacco abuse, recent left fibula fracture presented on 00/71 with metabolic encephalopathy. Onthe previous day he was noted to have a left fibular fracture after a fall. He was casted and discharged home with Tylenol. The next day, he did not present for his dialysis session. His daughter went to check on him and found him unresponsive. Patient was brought to the ED  and noted to be tachycardic, tachypneic with hypertension, hyperkalemia. Ammonia level within normal limits. CT/MRI of the head was performed, reviewed unremarkable for acute process. Neurology was consulted due to altered mental status. MRI was ordered due to concerns of PRES,but patient was not able to lie still initially. There was concern for sepsis and broad-spectrum antibiotics were started, but since have been DC'd. Hospital course further complicated by leukocytosis and anemia of chronic disease. Patient remains nonweightbearing left lower extremity. Patient with resultant functional deficits with mobility, transfers, safety, self-care. Will set goals for Supervision with PT/OT and Supervision/Min A with SLP.   See Team Conference Notes for weekly updates to the plan of care

## 2017-02-04 NOTE — Progress Notes (Signed)
  [facility] Progress Note   Subjective:  Seen in room, now in CIR. S/p rehab this morning. No CP/dyspnea.   Objective Vitals:   02/03/17 1722 02/03/17 2023 02/04/17 0409 02/04/17 0849  BP: 139/81 (!) 159/87 (!) 160/80 (!) 152/72  Pulse: 89 93 90 88  Resp: 17 18 18    Temp: 99.3 F (37.4 C)  99 F (37.2 C)   TempSrc: Oral  Oral   SpO2: 100% 100% 100% 100%  Weight: 89.1 kg (196 lb 8 oz)     Height: 5\' 9"  (1.753 m)      Physical Exam General: Well appearing, NAD. Heart: RRR; no murmur Lungs: CTAB Extremities: No LE edema Dialysis Access: RUE AVF + thrill  Additional Objective Labs: Basic Metabolic Panel: Recent Labs  Lab 01/29/17 0314 01/31/17 0955 02/03/17 1432  NA 135  133* 131* 132*  K 4.4  4.6 4.0 4.1  CL 99*  98* 93* 93*  CO2 24  22 25 27   GLUCOSE 97  90 104* 155*  BUN 34*  37* 46* 21*  CREATININE 8.86*  8.66* 8.86* 5.54*  CALCIUM 9.1  8.9 8.8* 8.3*  PHOS 5.9* 5.0* 3.5   Liver Function Tests: Recent Labs  Lab 01/29/17 0314 01/31/17 0955 02/03/17 1432  ALBUMIN 3.5 3.3* 3.3*   CBC: Recent Labs  Lab 01/29/17 0314 01/31/17 0955 02/03/17 1432  WBC 11.3* 9.7 7.9  NEUTROABS 7.8*  --  5.3  HGB 10.6* 9.8* 9.8*  HCT 31.1* 29.0* 28.3*  MCV 104.4* 102.8* 101.1*  PLT 272 274 310   CBG: Recent Labs  Lab 02/03/17 0614 02/03/17 1637 02/03/17 2036 02/04/17 0607 02/04/17 1208  GLUCAP 100* 125* 137* 103* 112*   Medications:  . amLODipine  10 mg Oral Daily  . atorvastatin  20 mg Oral q1800  . [START ON 02/05/2017] calcitRIOL  2 mcg Oral Q M,W,F  . calcium acetate  1,334 mg Oral TID WC  . [START ON 02/05/2017] cinacalcet  120 mg Oral Q M,W,F  . folic acid  1 mg Oral Daily  . heparin  5,000 Units Subcutaneous Q8H  . insulin aspart  0-15 Units Subcutaneous TID AC & HS  . metoprolol succinate  12.5 mg Oral Q T,Th,S,Su  . pantoprazole  40 mg Oral Daily  . tamsulosin  0.4 mg Oral QPC supper    Dialysis Orders: MWF [facility] 4h  35min  95.5kg   2/2.5   Hep 8600  R IJ cath (and Rt BCF recently revised by Dr. Donzetta Matters + PTA by Dr. Scot Dock) -calc 2 ug -mirc 50 given 11/28 -sensipar 120 tiw po     Impression: 1. ESRD: Continue HD per MWF schedule, next 12/28. 2. AMS/metabolic encephalopathy: Resolved, to baseline. 3. L distal fibula fracture: Improving, in rehab. 4. Anemia of CKD: Hgb 9.8, restart ESA (Aranesp 44mcg weekly). 5. MBD of CKD: Corr Ca/Phos at goal. Continue binders/VDRA/sensipar. 6. Type 2 DM 7. COPD 8. HTN: Continue norvasc/ metop. Well below prior EDW, setting new EDW around 88.5kg.   Plan - Next HD Fri (discussed will be later in day to allow for morning therapy sessions)     Veneta Penton, PA-C 02/04/2017, 2:40 PM  [facility] Pager: [phone]  Pt seen, examined and agree w A/P as above.  Kelly Splinter MD [employer] pager [phone]   02/04/2017, 3:34 PM
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
## 2017-02-04 NOTE — Progress Notes (Signed)
[facility]     PROGRESS NOTE  Subjective/Complaints:  Patient seen lying in bed this morning. He states his did not sleep well overnight, but cannot identify reason. He is ready to begin therapies.  ROS: Denies CP, SOB, nausea, vomiting, diarrhea.  Objective: Vital Signs: Blood pressure (!) 152/72, pulse 88, temperature 99 F (37.2 C), temperature source Oral, resp. rate 18, height 5\' 9"  (1.753 m), weight 89.1 kg (196 lb 8 oz), SpO2 100 %. No results found. Recent Labs    02/03/17 1432  WBC 7.9  HGB 9.8*  HCT 28.3*  PLT 310   Recent Labs    02/03/17 1432  NA 132*  K 4.1  CL 93*  GLUCOSE 155*  BUN 21*  CREATININE 5.54*  CALCIUM 8.3*   CBG (last 3)  Recent Labs    02/02/17 2105 02/03/17 0614 02/03/17 1637  GLUCAP 155* 100* 125*    Wt Readings from Last 3 Encounters:  02/03/17 89.1 kg (196 lb 8 oz)  02/03/17 88.7 kg (195 lb 8.8 oz)  01/19/17 95.7 kg (211 lb)    Physical Exam:  BP (!) 152/72   Pulse 88   Temp 99 F (37.2 C) (Oral)   Resp 18   Ht 5\' 9"  (1.753 m)   Wt 89.1 kg (196 lb 8 oz)   SpO2 100%   BMI 29.02 kg/m  Constitutional: He appears well-developed. Obese  HENT: Normocephalic and atraumatic.  Eyes: EOM are normal. No discharge.  Cardiovascular: Normal rate and rhythm. No JVD Respiratory: Effort normal and breath sounds normal.  GI: Soft. Bowel sounds are normal.  Musculoskeletal: No edema. No tenderness.  Neurological: He is alert and oriented 3.  Motor: Bilateral upper extremities 5/5 proximal distal Left lower extremity: 5/5 proximal distal Right lower extremity: 4+/5 proximally, ankle limited by cast.  Skin: Distal LLE with cast Psychiatric: His affect is blunt. He is slowed.    Assessment/Plan: 1. Functional deficits secondary to metabolic encephalopathy which require 3+ hours per day of interdisciplinary therapy in a comprehensive inpatient rehab setting. Physiatrist is providing close team  supervision and 24 hour management of active medical problems listed below. Physiatrist and rehab team continue to assess barriers to discharge/monitor patient progress toward functional and medical goals.  Function:  Bathing Bathing position      Bathing parts      Bathing assist        Upper Body Dressing/Undressing Upper body dressing                    Upper body assist        Lower Body Dressing/Undressing Lower body dressing                                  Lower body assist        Toileting Toileting          Toileting assist     Transfers Chair/bed transfer             Locomotion Ambulation           Wheelchair          Cognition Comprehension    Expression    Social Interaction    Problem Solving    Memory      Medical Problem List and Plan: 1.  Decreased functional mobility secondary to metabolic encephalopathy.   Begin CIR 2.  DVT Prophylaxis/Anticoagulation: Subcutaneous heparin. Monitor for any bleeding episodes 3. Pain Management: Tylenol as needed 4. Mood: Provide emotional support 5. Neuropsych: This patient is not fully capable of making decisions on his own behalf. 6. Skin/Wound Care: Routine skin checks 7. Fluids/Electrolytes/Nutrition: Routine I&O's 8. End-stage renal disease. Continue hemodialysis as per renal services 9. Hypertension. Toprol 12.5 mg Tuesday Thursday Saturday and Sunday, Norvasc 10 mg daily   Monitor with increased mobility 10. Diabetes mellitus.SSI. Check blood sugars before meals and at bedtime   Monitor with increased mobility 11. Closed Left distal fibular fracture/nondisplaced. Nonweightbearing. Patient will need follow-up with orthopedic services. 12. Hyperlipidemia. Lipitor 13. Leukocytosis: Resolved    WBCs 7.9 on 12/26 14. Anemia of chronic disease   Hemoglobin 9.8 on 12/26   Continue to monitor 15. Obesity   Body mass index is 29.02 kg/m.   Diet and exercise  education   Will cont to encourage weight loss to increase endurance and promote overall health  LOS (Days) 1 A FACE TO FACE EVALUATION WAS PERFORMED  Ankit Lorie Phenix 02/04/2017 9:04 AM   '
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
## 2017-02-04 NOTE — Progress Notes (Signed)
Patient information reviewed and entered into eRehab system by Sidni Fusco, RN, CRRN, PPS Coordinator.  Information including medical coding and functional independence measure will be reviewed and updated through discharge.     Per nursing patient was given "Data Collection Information Summary for Patients in Inpatient Rehabilitation Facilities with attached "Privacy Act Statement-Health Care Records" upon admission.  
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
## 2017-02-05 ENCOUNTER — Inpatient Hospital Stay (HOSPITAL_COMMUNITY): Payer: Medicare Other | Admitting: Occupational Therapy

## 2017-02-05 ENCOUNTER — Inpatient Hospital Stay (HOSPITAL_COMMUNITY): Payer: Medicare Other | Admitting: Speech Pathology

## 2017-02-05 ENCOUNTER — Inpatient Hospital Stay (HOSPITAL_COMMUNITY): Payer: Medicare Other

## 2017-02-05 LAB — GLUCOSE, CAPILLARY
GLUCOSE-CAPILLARY: 103 mg/dL — AB (ref 65–99)
GLUCOSE-CAPILLARY: 119 mg/dL — AB (ref 65–99)
Glucose-Capillary: 121 mg/dL — ABNORMAL HIGH (ref 65–99)
Glucose-Capillary: 145 mg/dL — ABNORMAL HIGH (ref 65–99)

## 2017-02-05 LAB — CBC
HEMATOCRIT: 27.5 % — AB (ref 39.0–52.0)
Hemoglobin: 9.3 g/dL — ABNORMAL LOW (ref 13.0–17.0)
MCH: 34.4 pg — ABNORMAL HIGH (ref 26.0–34.0)
MCHC: 33.8 g/dL (ref 30.0–36.0)
MCV: 101.9 fL — AB (ref 78.0–100.0)
PLATELETS: 313 10*3/uL (ref 150–400)
RBC: 2.7 MIL/uL — AB (ref 4.22–5.81)
RDW: 14.3 % (ref 11.5–15.5)
WBC: 8.9 10*3/uL (ref 4.0–10.5)

## 2017-02-05 LAB — RENAL FUNCTION PANEL
ALBUMIN: 3.5 g/dL (ref 3.5–5.0)
Anion gap: 13 (ref 5–15)
BUN: 57 mg/dL — AB (ref 6–20)
CHLORIDE: 96 mmol/L — AB (ref 101–111)
CO2: 23 mmol/L (ref 22–32)
CREATININE: 10.22 mg/dL — AB (ref 0.61–1.24)
Calcium: 9.6 mg/dL (ref 8.9–10.3)
GFR, EST AFRICAN AMERICAN: 5 mL/min — AB (ref >60)
GFR, EST NON AFRICAN AMERICAN: 5 mL/min — AB (ref >60)
Glucose, Bld: 112 mg/dL — ABNORMAL HIGH (ref 65–99)
PHOSPHORUS: 6 mg/dL — AB (ref 2.5–4.6)
POTASSIUM: 5 mmol/L (ref 3.5–5.1)
Sodium: 132 mmol/L — ABNORMAL LOW (ref 135–145)

## 2017-02-05 MED ORDER — SODIUM CHLORIDE 0.9 % IV SOLN: 100.0000 mL | INTRAVENOUS | Status: DC | PRN

## 2017-02-05 MED ORDER — ALTEPLASE 2 MG IJ SOLR: 2.0000 mg | Freq: Once | INTRAMUSCULAR | Status: DC | PRN

## 2017-02-05 MED ORDER — HEPARIN SODIUM (PORCINE) 1000 UNIT/ML DIALYSIS
1000.0000 [IU] | INTRAMUSCULAR | Status: DC | PRN
  Administered 2017-02-05 – 2017-02-09 (×2): 1000 [IU] via INTRAVENOUS_CENTRAL

## 2017-02-05 MED ORDER — LIDOCAINE HCL (PF) 1 % IJ SOLN
5.0000 mL | INTRAMUSCULAR | Status: DC | PRN
  Filled 2017-02-05: qty 5

## 2017-02-05 MED ORDER — HYDRALAZINE HCL 10 MG PO TABS
10.0000 mg | ORAL_TABLET | Freq: Three times a day (TID) | ORAL | Status: DC
  Administered 2017-02-05 – 2017-02-08 (×9): 10 mg via ORAL
  Filled 2017-02-05 (×9): qty 1

## 2017-02-05 MED ORDER — LIDOCAINE-PRILOCAINE 2.5-2.5 % EX CREA
1.0000 "application " | TOPICAL_CREAM | CUTANEOUS | Status: DC | PRN
  Filled 2017-02-05: qty 5

## 2017-02-05 MED ORDER — HEPARIN SODIUM (PORCINE) 1000 UNIT/ML DIALYSIS
20.0000 [IU]/kg | INTRAMUSCULAR | Status: DC | PRN
  Administered 2017-02-05: 1800 [IU] via INTRAVENOUS_CENTRAL
  Filled 2017-02-05 (×2): qty 2

## 2017-02-05 MED ORDER — PENTAFLUOROPROP-TETRAFLUOROETH EX AERO: 1.0000 "application " | INHALATION_SPRAY | CUTANEOUS | Status: DC | PRN

## 2017-02-05 MED ORDER — DARBEPOETIN ALFA 60 MCG/0.3ML IJ SOSY
PREFILLED_SYRINGE | INTRAMUSCULAR | Status: AC
  Administered 2017-02-05: 60 ug via INTRAVENOUS
  Filled 2017-02-05: qty 0.3

## 2017-02-05 NOTE — Progress Notes (Signed)
HD tx ended @ 2200, 2 hr early per pt request and ok by Dr. Justin Mend, UF goal not met, blood rinsed back, report called to Patrici Ranks, RN

## 2017-02-05 NOTE — Progress Notes (Signed)
[facility] PHYSICAL MEDICINE & REHABILITATION     PROGRESS NOTE  Subjective/Complaints:  Pt seen laying in bed this Am.  He slept well overnight.  She states hew as aught off guard with having to do math yesterday.   ROS: Denies CP, SOB, nausea, vomiting, diarrhea.  Objective: Vital Signs: Blood pressure (!) 166/84, pulse 91, temperature 99.3 F (37.4 C), temperature source Oral, resp. rate 17, height 5\' 9"  (1.753 m), weight 89.1 kg (196 lb 8 oz), SpO2 98 %. No results found. Recent Labs    02/03/17 1432  WBC 7.9  HGB 9.8*  HCT 28.3*  PLT 310   Recent Labs    02/03/17 1432  NA 132*  K 4.1  CL 93*  GLUCOSE 155*  BUN 21*  CREATININE 5.54*  CALCIUM 8.3*   CBG (last 3)  Recent Labs    02/04/17 1634 02/04/17 2244 02/05/17 0636  GLUCAP 158* 98 121*    Wt Readings from Last 3 Encounters:  02/03/17 89.1 kg (196 lb 8 oz)  02/03/17 88.7 kg (195 lb 8.8 oz)  01/19/17 95.7 kg (211 lb)    Physical Exam:  BP (!) 166/84 (BP Location: Left Arm)   Pulse 91   Temp 99.3 F (37.4 C) (Oral)   Resp 17   Ht 5\' 9"  (1.753 m)   Wt 89.1 kg (196 lb 8 oz)   SpO2 98%   BMI 29.02 kg/m  Constitutional: He appears well-developed. Obese  HENT: Normocephalic and atraumatic.  Eyes: EOM are normal. No discharge.  Cardiovascular: RRR. No JVD Respiratory: Effort normal and breath sounds normal.  GI: Soft. Bowel sounds are normal.  Musculoskeletal: No edema. No tenderness.  Neurological: He is alert and oriented 3.  Motor: Bilateral upper extremities 5/5 proximal distal Left lower extremity: 5/5 proximal distal Right lower extremity: 4+-5/5 proximally, ankle limited by cast.  Skin: Distal LLE with cast Psychiatric: His affect is blunt. He is slowed.    Assessment/Plan: 1. Functional deficits secondary to metabolic encephalopathy which require 3+ hours per day of interdisciplinary therapy in a comprehensive inpatient rehab setting. Physiatrist is providing close team supervision  and 24 hour management of active medical problems listed below. Physiatrist and rehab team continue to assess barriers to discharge/monitor patient progress toward functional and medical goals.  Function:  Bathing Bathing position   Position: Sitting EOB  Bathing parts Body parts bathed by patient: Right arm, Left arm, Chest, Abdomen, Front perineal area, Buttocks, Right upper leg, Left upper leg Body parts bathed by helper: Back, Right lower leg  Bathing assist Assist Level: Touching or steadying assistance(Pt > 75%)      Upper Body Dressing/Undressing Upper body dressing   What is the patient wearing?: Hospital gown                Upper body assist Assist Level: Set up, Supervision or verbal cues   Set up : To obtain clothing/put away  Lower Body Dressing/Undressing Lower body dressing   What is the patient wearing?: Non-skid slipper socks           Non-skid slipper socks- Performed by helper: Don/doff right sock, Don/doff left sock                  Lower body assist        Toileting Toileting Toileting activity did not occur: No continent bowel/bladder event        Toileting assist     Transfers Chair/bed transfer   Chair/bed transfer  method: Stand pivot, Squat pivot Chair/bed transfer assist level: Touching or steadying assistance (Pt > 75%)       Locomotion Ambulation     Max distance: 4 Assist level: Moderate assist (Pt 50 - 74%)   Wheelchair   Type: Manual Max wheelchair distance: 60 Assist Level: Touching or steadying assistance (Pt > 75%)  Cognition Comprehension Comprehension assist level: Understands basic 50 - 74% of the time/ requires cueing 25 - 49% of the time  Expression Expression assist level: Expresses basic 90% of the time/requires cueing < 10% of the time.  Social Interaction Social Interaction assist level: Interacts appropriately 50 - 74% of the time - May be physically or verbally inappropriate.  Problem Solving Problem  solving assist level: Solves basic 25 - 49% of the time - needs direction more than half the time to initiate, plan or complete simple activities  Memory Memory assist level: Recognizes or recalls 25 - 49% of the time/requires cueing 50 - 75% of the time    Medical Problem List and Plan: 1.  Decreased functional mobility secondary to metabolic encephalopathy.   Cont CIR 2.  DVT Prophylaxis/Anticoagulation: Subcutaneous heparin. Monitor for any bleeding episodes 3. Pain Management: Tylenol as needed 4. Mood: Provide emotional support 5. Neuropsych: This patient is not fully capable of making decisions on his own behalf. 6. Skin/Wound Care: Routine skin checks 7. Fluids/Electrolytes/Nutrition: Routine I&O's 8. End-stage renal disease. Continue hemodialysis as per renal services 9. Hypertension. Toprol 12.5 mg Tuesday Thursday Saturday and Sunday, Norvasc 10 mg daily   Hydralazine 10 TID started on 12/28 10. Diabetes mellitus.SSI. Check blood sugars before meals and at bedtime   Relatively controlled on 12/28 11. Closed Left distal fibular fracture/nondisplaced. Nonweightbearing. Patient will need follow-up with orthopedic services. 12. Hyperlipidemia. Lipitor 13. Leukocytosis: Resolved    WBCs 7.9 on 12/26 14. Anemia of chronic disease   Hemoglobin 9.8 on 12/26   Labs with HD   Continue to monitor 15. Obesity   Body mass index is 29.02 kg/m.   Diet and exercise education   Will cont to encourage weight loss to increase endurance and promote overall health  LOS (Days) 2 A FACE TO FACE EVALUATION WAS PERFORMED  Chardonnay Holzmann Lorie Phenix 02/05/2017 9:40 AM   '

## 2017-02-05 NOTE — Progress Notes (Signed)
Speech Language Pathology Daily Session Note  Patient Details  Name: [name] MRN: [id number] Date of Birth: [birth year]  Today's Date: 02/05/2017 SLP Individual Time: 1405-1500 SLP Individual Time Calculation (min): 55 min  Short Term Goals: Week 1: SLP Short Term Goal 1 (Week 1): Patient will demonstrate functional problem solving for basic and famliar tasks with Min A verbal cues.  SLP Short Term Goal 2 (Week 1): Patient will utilize external aids to recall new, daily information with mod A verbal cues.  SLP Short Term Goal 3 (Week 1): Patient will self-monitor and correct errors during functional tasks with Min A verbal cues.  SLP Short Term Goal 4 (Week 1): Patient will demonstrate sustained attention to functional tasks for ~30 minutes with Min A verbal cues for redirection.   Skilled Therapeutic Interventions: Skilled treatment session focused on cognitive goals. SLP facilitated session by providing Max A multimodal cues for patient to adhere to weightbearing precautions during transfer to commode. SLP also facilitated session by administering the MoCA (version 7.2). Patient scored 19/30 points with a score of 26 or above considered normal. Patient demonstrated deficits in recall, executive functioning, problem solving, and attention. Patient left upright in wheelchair with all needs within reach. Continue with current plan of care.      Function:  Cognition Comprehension Comprehension assist level: Understands basic 50 - 74% of the time/ requires cueing 25 - 49% of the time  Expression   Expression assist level: Expresses basic 90% of the time/requires cueing < 10% of the time.  Social Interaction Social Interaction assist level: Interacts appropriately 50 - 74% of the time - May be physically or verbally inappropriate.  Problem Solving Problem solving assist level: Solves basic 25 - 49% of the time - needs direction more than half the time to initiate, plan or complete simple  activities  Memory Memory assist level: Recognizes or recalls 25 - 49% of the time/requires cueing 50 - 75% of the time    Pain No/Denies Pain   Therapy/Group: Individual Therapy  Roshonda Sperl 02/05/2017, 3:49 PM

## 2017-02-05 NOTE — Progress Notes (Signed)
Occupational Therapy Session Note  Patient Details  Name: [name] MRN: [id number] Date of Birth: 22-Apr-1950  Today's Date: 02/05/2017 OT Individual Time: 0973-5329 OT Individual Time Calculation (min): 55 min    Short Term Goals: Week 1:  OT Short Term Goal 1 (Week 1): Pt will perform LB dressing with min A overall.  OT Short Term Goal 2 (Week 1): Pt will perform toilet transfer with use of RW with steady assistance for balance.  OT Short Term Goal 3 (Week 1): Pt will verbalize and demonstrate NWB L LE with min cues for safety with functional transfers.   Skilled Therapeutic Interventions/Progress Updates:    Tx focus on precaution adherence, ADL retraining, and safety awareness during functional tasks.   Pt greeted EOB, using urinal. Declining bathing, but agreeable to change into clothes today. Pts CAM boot was doffed. Pt adamant that he no longer needed boot and was using post-op shoe in previous therapies for transfers. Per MD order, CAM boot to be worn. Per RN, SLP, and safety plan, this order stands. PA was not present in office for consultation during session. Therefore CAM boot was donned after LB garments were threaded EOB. He required Mod-Max A for LB dressing (and also to don CAM boot). OT kept L LE off of floor while he stood with RW for Lt NWB. Pt ultimately bearing weight in Lt leg while lifting underwear over hips, did not appear concerned when OT educated him on this. Supervision for UB dressing. He completed squat pivot<w/c with OT supporting L LE. Oral care/grooming tasks/shaving completed w/c level with L LE elevated. Practiced sit<stands with RW with bent knee, took small hops forward and backwards with pt requiring mod cues for keeping boot off of floor. Pt left in w/c with all needs within reach and safety belt applied.    Therapy Documentation Precautions:  Precautions Precautions: Fall Required Braces or Orthoses: Other Brace/Splint Other Brace/Splint: Short leg  cast/CAM boot order Restrictions Weight Bearing Restrictions: Yes LLE Weight Bearing: Non weight bearing   Pain: No c/o pain during tx    ADL:    See Function Navigator for Current Functional Status.   Therapy/Group: Individual Therapy  Ellisha Bankson A Jayvyn Haselton 02/05/2017, 10:54 AM

## 2017-02-05 NOTE — Progress Notes (Signed)
HD tx initiated via HD cath w/o problem, pull/push/flush equally slightly sluggish, lines reversed, VSS, will cont to monitor while on HD tx

## 2017-02-05 NOTE — Progress Notes (Signed)
Occupational Therapy Session Note  Patient Details  Name: [name] MRN: [id number] Date of Birth: 1950-12-07  Today's Date: 02/05/2017 OT Individual Time: 1130-1200 OT Individual Time Calculation (min): 30 min    Short Term Goals: Week 1:  OT Short Term Goal 1 (Week 1): Pt will perform LB dressing with min A overall.  OT Short Term Goal 2 (Week 1): Pt will perform toilet transfer with use of RW with steady assistance for balance.  OT Short Term Goal 3 (Week 1): Pt will verbalize and demonstrate NWB L LE with min cues for safety with functional transfers.   Skilled Therapeutic Interventions/Progress Updates:    1:1 Therapeutic activity with focus on sit to stands and standing balance during simulated clothing management while maintaining weight bearing status with mod VC. Pt also performed abstract cognitive task along with sequencing tasks with minimal questioning cues. Returned to room- propelling his own chair with more than reasonable amt of time.   Therapy Documentation Precautions:  Precautions Precautions: Fall Required Braces or Orthoses: Other Brace/Splint Other Brace/Splint: Short leg cast/CAM boot order Restrictions Weight Bearing Restrictions: Yes LLE Weight Bearing: Non weight bearing General:   Vital Signs: Therapy Vitals Temp: 98.6 F (37 C) Temp Source: Oral Pulse Rate: 91 Resp: 18 BP: (!) 166/67 Patient Position (if appropriate): Sitting Oxygen Therapy SpO2: 99 % O2 Device: Not Delivered Pain:  no c/o pain  See Function Navigator for Current Functional Status.   Therapy/Group: Individual Therapy  Willeen Cass [facility] 02/05/2017, 4:03 PM

## 2017-02-05 NOTE — Progress Notes (Signed)
Attempted report from bedside RN, unable to reach at this time.  Request made with charge RN (unable to give report) for primary RN to phone HD when able.

## 2017-02-05 NOTE — Progress Notes (Signed)
Physical Therapy Session Note  Patient Details  Name: MOUSSA WIEGAND MRN: [id number] Date of Birth: 1950/05/04  Today's Date: 02/05/2017 PT Individual Time: 1030-1115 PT Individual Time Calculation (min): 45 min   Short Term Goals: Week 1:  PT Short Term Goal 1 (Week 1): pt will stand pivot transfer with LRAD, NWBing LLE without physical assistance PT Short Term Goal 2 (Week 1): pt will propel w/c on level tile x 150' with supervision PT Short Term Goal 3 (Week 1): pt will perform gait x 30' with min assist, LRAD, NWBing LLE without physical assistance, with orthosis to hold up LLE PRN  Skilled Therapeutic Interventions/Progress Updates:    Session focused on functional transfers while maintaining WB restrictions, functional endurance and UE strengthening during w/c mobility training to/from therapy with cues for efficient technique, sit <> stand re-training with focus on correct technique and maintaining NWB status with min physical assist, short distance gait training with overall min assist for balance and verbal cues for safe placement of RW, and standing therex for LLE including hip flexion, hip abduction, and knee flexion x 10 reps each.    Therapy Documentation Precautions:  Precautions Precautions: Fall Required Braces or Orthoses: Other Brace/Splint Other Brace/Splint: Short leg cast/CAM boot order Restrictions Weight Bearing Restrictions: Yes LLE Weight Bearing: Non weight bearing   Pain:  No complaints of pain, just being cold and tired.    See Function Navigator for Current Functional Status.   Therapy/Group: Individual Therapy  Canary Brim Ivory Broad, PT, DPT  02/05/2017, 12:08 PM

## 2017-02-06 ENCOUNTER — Inpatient Hospital Stay (HOSPITAL_COMMUNITY): Payer: Medicare Other | Admitting: Physical Therapy

## 2017-02-06 ENCOUNTER — Inpatient Hospital Stay (HOSPITAL_COMMUNITY): Payer: Medicare Other

## 2017-02-06 ENCOUNTER — Inpatient Hospital Stay (HOSPITAL_COMMUNITY): Payer: Medicare Other | Admitting: Occupational Therapy

## 2017-02-06 DIAGNOSIS — N186 End stage renal disease: Secondary | ICD-10-CM

## 2017-02-06 DIAGNOSIS — G9341 Metabolic encephalopathy: Principal | ICD-10-CM

## 2017-02-06 DIAGNOSIS — E669 Obesity, unspecified: Secondary | ICD-10-CM

## 2017-02-06 DIAGNOSIS — E1169 Type 2 diabetes mellitus with other specified complication: Secondary | ICD-10-CM

## 2017-02-06 DIAGNOSIS — Z992 Dependence on renal dialysis: Secondary | ICD-10-CM

## 2017-02-06 LAB — GLUCOSE, CAPILLARY
Glucose-Capillary: 90 mg/dL (ref 65–99)
Glucose-Capillary: 131 mg/dL — ABNORMAL HIGH (ref 65–99)
Glucose-Capillary: 117 mg/dL — ABNORMAL HIGH (ref 65–99)
Glucose-Capillary: 121 mg/dL — ABNORMAL HIGH (ref 65–99)

## 2017-02-06 NOTE — Progress Notes (Signed)
Pt has refused NIV for 5 nights, denies history of OSA or difficulty breathing.  Removed unit from room.  Order remains for RCP to follow.

## 2017-02-06 NOTE — Progress Notes (Signed)
Occupational Therapy Session Note  Patient Details  Name: [name] MRN: [id number] Date of Birth: 01/09/1951  Today's Date: 02/06/2017 OT Individual Time: 8144-8185 and 6314-9702 OT Individual Time Calculation (min): 41 min and 32 min Missed OT time 19 minutes due to nausea  Short Term Goals: Week 1:  OT Short Term Goal 1 (Week 1): Pt will perform LB dressing with min A overall.  OT Short Term Goal 2 (Week 1): Pt will perform toilet transfer with use of RW with steady assistance for balance.  OT Short Term Goal 3 (Week 1): Pt will verbalize and demonstrate NWB L LE with min cues for safety with functional transfers.   Skilled Therapeutic Interventions/Progress Updates:   Pt greeted supine in bed, reported that he had been ill this AM with bouts of vomiting. Got him some ginger ale and he transitioned to EOB with supervision. Pt maintaining balance without UE support while drinking and discussing d/c plans with OT. Pt reported he wanted to sit in recliner after some time EOB to decrease nausea. Provided pt with demonstrational cues for stand hop transfer to recliner with use of RW. After CAM boot was donned, pt extended L LE prior to sit<stand. Provided him with several strategies prior to transfer to promote precaution adherence. He was able to hop with Rt LE only towards recliner, however he lowered boot to floor during stand<sit transition. Max cues for extending L LE for NWB. He was repositioned for comfort, LEs elevated. Pt left in care of RN to provide anti-nausea medication.   BP: 172/73, 02 99% on RA, HR 86BPM at end of tx. RN made aware.   2nd Session 1:1 tx (32 minutes) Pt greeted while up and ambulating by himself in room (without CAM boot or post op shoe). Pt disregarding education that he cannot be transferring without staff assist/violating precautions. Once pt was seated EOB, donned CAM boot and had him complete squat pivot<w/c with OT keeping Lt LE off of floor. Pt completing  shaving w/c level with setup. Afterwards he self propelled w/c down hallway for UB strengthening, had him avoid environmental barriers/weave around OT for increasing challenge. Min A for turning and for safety awareness on Rt side. Once back in room, pt standing with RW (OT's foot placed under his Lt foot during sit<stand). Able to maintain balance on Rt LE while OT positioned chair alarm cushion (post RN consultation). Pt left in w/c with chair alarm set and RN present.   Therapy Documentation Precautions:  Precautions Precautions: Fall Required Braces or Orthoses: Other Brace/Splint Other Brace/Splint: Short leg cast/CAM boot order Restrictions Weight Bearing Restrictions: Yes LLE Weight Bearing: Non weight bearing Vital Signs: Therapy Vitals Pulse Rate: 89 BP: (!) 172/76 Pain: Pain Assessment Pain Assessment: No/denies pain ADL:      See Function Navigator for Current Functional Status.  Therapy/Group: Individual Therapy  Ayrianna Mcginniss A Miniya Miguez 02/06/2017, 12:17 PM

## 2017-02-06 NOTE — Progress Notes (Signed)
Speech Language Pathology Daily Session Note  Patient Details  Name: [name] MRN: [id number] Date of Birth: [birth year]  Today's Date: 02/06/2017 SLP Individual Time: 1400-1420 SLP Individual Time Calculation (min): 20 min  Short Term Goals: Week 1: SLP Short Term Goal 1 (Week 1): Patient will demonstrate functional problem solving for basic and famliar tasks with Min A verbal cues.  SLP Short Term Goal 2 (Week 1): Patient will utilize external aids to recall new, daily information with mod A verbal cues.  SLP Short Term Goal 3 (Week 1): Patient will self-monitor and correct errors during functional tasks with Min A verbal cues.  SLP Short Term Goal 4 (Week 1): Patient will demonstrate sustained attention to functional tasks for ~30 minutes with Min A verbal cues for redirection.   Skilled Therapeutic Interventions: Skilled ST services focused on cognitive skills. Pt was alert upon entering room and recalled SLP earlier visit with min A verbal cues. Pt agreed to participate in skilled ST services focusing on basic problem solving and error monitoring /correction during tasks,  Pt required supervision A question cues to sequence 3 step cards, however required mod-min A verbal/visual cues to sequence 4 step card and min A verbal cues to monitor and correct errors. SLP reviewed today's schedule to aid in recall with visual aid, pt required mod A verbal cues to redirect to visual aid. Pt was left in room with call bell within reach. Recommend to continue skilled St services.     Function:  Eating Eating   Modified Consistency Diet: No             Cognition Comprehension Comprehension assist level: Understands basic 50 - 74% of the time/ requires cueing 25 - 49% of the time  Expression   Expression assist level: Expresses basic 75 - 89% of the time/requires cueing 10 - 24% of the time. Needs helper to occlude trach/needs to repeat words.  Social Interaction Social Interaction assist  level: Interacts appropriately 75 - 89% of the time - Needs redirection for appropriate language or to initiate interaction.  Problem Solving Problem solving assist level: Solves basic 50 - 74% of the time/requires cueing 25 - 49% of the time;Solves basic 75 - 89% of the time/requires cueing 10 - 24% of the time  Memory Memory assist level: Recognizes or recalls 50 - 74% of the time/requires cueing 25 - 49% of the time    Pain Pain Assessment Pain Assessment: No/denies pain  Therapy/Group: Individual Therapy  Lissete Maestas  [facility] 02/06/2017, 2:24 PM

## 2017-02-06 NOTE — Progress Notes (Signed)
Speech Language Pathology Daily Session Note  Patient Details  Name: [name] MRN: [id number] Date of Birth: 1950-09-24  Today's Date: 02/06/2017 SLP Individual Time: 1015-1030 SLP Individual Time Calculation (min): 15 min  Short Term Goals: Week 1: SLP Short Term Goal 1 (Week 1): Patient will demonstrate functional problem solving for basic and famliar tasks with Min A verbal cues.  SLP Short Term Goal 2 (Week 1): Patient will utilize external aids to recall new, daily information with mod A verbal cues.  SLP Short Term Goal 3 (Week 1): Patient will self-monitor and correct errors during functional tasks with Min A verbal cues.  SLP Short Term Goal 4 (Week 1): Patient will demonstrate sustained attention to functional tasks for ~30 minutes with Min A verbal cues for redirection.   Skilled Therapeutic Interventions: Skilled ST services focused on cognitive skills. Pt was asleep in recliner chair upon entering room, pt required max verbal and tactile cues to become alert responding with eyes closed stating " I feel sick" and given yes/no questions responding that he has thrown up and needs to sleep. SLP communicated with nurse and she stated neasu medication was given, vital signs where within functional limits and he had recently returned from dialysis. SLP provided ginger ale and pt attempted to bring straw to mouth, however spilled drink with reduced alertness. Pt requested "no therapy. I need to sleep." SLP educated pt on the importance of therapy and missed therapy time. Pt was unable to state understanding due to pt being asleep. Pt was left in room with call bell within reach. Recommend to continue skilled ST services.     Function:  Eating Eating                 Cognition Comprehension Comprehension assist level: Understands basic 50 - 74% of the time/ requires cueing 25 - 49% of the time  Expression   Expression assist level: Expresses basic 75 - 89% of the time/requires  cueing 10 - 24% of the time. Needs helper to occlude trach/needs to repeat words.  Social Interaction Social Interaction assist level: Interacts appropriately 75 - 89% of the time - Needs redirection for appropriate language or to initiate interaction.  Problem Solving Problem solving assist level: Solves basic 50 - 74% of the time/requires cueing 25 - 49% of the time  Memory Memory assist level: Recognizes or recalls 50 - 74% of the time/requires cueing 25 - 49% of the time    Pain Pain Assessment Pain Assessment: No/denies pain  Therapy/Group: Individual Therapy  Xzavian Semmel  [facility] 02/06/2017, 10:35 AM

## 2017-02-06 NOTE — Progress Notes (Signed)
[facility] Progress Note   Subjective: Says he feels like his cast is too tight. Had episode of N & V. Given zofran-appears resolved now. Denies SOB. Says he signed off HD early because his shoulder was hurting.   Objective Vitals:   02/05/17 2307 02/06/17 0500 02/06/17 0552 02/06/17 0941  BP: (!) 155/65 (!) 153/77  (!) 172/76  Pulse: (!) 110 97  89  Resp: 18 16    Temp: 98.3 F (36.8 C) 98.3 F (36.8 C)    TempSrc: Oral Oral    SpO2: 100% 100%    Weight:   89.8 kg (197 lb 14.4 oz)   Height:       Physical Exam General: WNWD NAD Heart: S1,S2, RRR Lungs: CTAB A/P Abdomen: active BS non-tender Extremities: No LE edema. Hard cast and walking boot LLE Dialysis Access: RUA AVF + bruit RIJ TDC drsg CDI.   Additional Objective Labs: Basic Metabolic Panel: Recent Labs  Lab 01/31/17 0955 02/03/17 1432 02/05/17 2025  NA 131* 132* 132*  K 4.0 4.1 5.0  CL 93* 93* 96*  CO2 25 27 23   GLUCOSE 104* 155* 112*  BUN 46* 21* 57*  CREATININE 8.86* 5.54* 10.22*  CALCIUM 8.8* 8.3* 9.6  PHOS 5.0* 3.5 6.0*   Liver Function Tests: Recent Labs  Lab 01/31/17 0955 02/03/17 1432 02/05/17 2025  ALBUMIN 3.3* 3.3* 3.5   No results for input(s): LIPASE, AMYLASE in the last 168 hours. CBC: Recent Labs  Lab 01/31/17 0955 02/03/17 1432 02/05/17 2025  WBC 9.7 7.9 8.9  NEUTROABS  --  5.3  --   HGB 9.8* 9.8* 9.3*  HCT 29.0* 28.3* 27.5*  MCV 102.8* 101.1* 101.9*  PLT 274 310 313   Blood Culture    Component Value Date/Time   SDES BLOOD LEFT ARM 01/21/2017 1010   SPECREQUEST  01/21/2017 1010    BOTTLES DRAWN AEROBIC AND ANAEROBIC Blood Culture results may not be optimal due to an excessive volume of blood received in culture bottles   CULT NO GROWTH 5 DAYS 01/21/2017 1010   REPTSTATUS 01/26/2017 FINAL 01/21/2017 1010    Cardiac Enzymes: No results for input(s): CKTOTAL, CKMB, CKMBINDEX, TROPONINI in the last 168 hours. CBG: Recent Labs  Lab 02/05/17 0636  02/05/17 1134 02/05/17 1706 02/05/17 2342 02/06/17 0647  GLUCAP 121* 103* 145* 119* 90   Iron Studies: No results for input(s): IRON, TIBC, TRANSFERRIN, FERRITIN in the last 72 hours. @lablastinr3 @ Studies/Results: No results found. Medications: . sodium chloride    . sodium chloride     . amLODipine  10 mg Oral Daily  . atorvastatin  20 mg Oral q1800  . calcitRIOL  2 mcg Oral Q M,W,F  . calcium acetate  1,334 mg Oral TID WC  . cinacalcet  120 mg Oral Q M,W,F  . darbepoetin (ARANESP) injection - DIALYSIS  60 mcg Intravenous Q Fri-HD  . folic acid  1 mg Oral Daily  . heparin  5,000 Units Subcutaneous Q8H  . hydrALAZINE  10 mg Oral Q8H  . insulin aspart  0-15 Units Subcutaneous TID AC & HS  . metoprolol succinate  12.5 mg Oral Q T,Th,S,Su  . pantoprazole  40 mg Oral Daily  . tamsulosin  0.4 mg Oral QPC supper     Dialysis Orders: MWF [facility] 4h 80min 95.5kg 2/2.5 Hep 8600 R IJ cath (andRt BCF recently revised by Dr. Donzetta Matters + PTA by Dr. Scot Dock) -calc 2 ug -mirc 50 given 11/28 -sensipar 120 tiw po  Assessment:  1. AMS: Now resolved, back to baseline.  2. ESRD: MWF. Early S/O HD last PM. Next HD 02/08/17. K+ 5.0 prior to HD yesterday. H/O hyperkalemia. Recheck renal function in AM.  3. Ldistalfibula fracture: Improving, in rehab. 4. Anemia of CKD: Hgb 9.8, restart ESA (Aranesp 79mcg weekly). 5. MBD of CKD: Corr Ca/Phos at goal. Continue binders/VDRA/sensipar. 6. Type 2 DM 7. COPD 8. HTN: Continue norvasc/ metop. Well below prior EDW, setting new EDW around 88.5kg. BP on high side today. Did not run full treatment, left above new EDW. Does not appear volume overloaded by exam.  9. Nutrition: Albumin 3.5. Changed to renal/carb mod diet with fld restrictions.   Plan -Next HD Monday (discussed will be later in day to allow for morning therapy sessions)     Jimmye Norman. Brown NP-C 02/06/2017, 11:15 AM  [facility] [phone]  Pt seen, examined and  agree w A/P as above.  Kelly Splinter MD [employer] pager [phone]   02/06/2017, 11:54 AM

## 2017-02-06 NOTE — Progress Notes (Signed)
Patient ID: [name], male   DOB: 17-Aug-1950, 66 y.o.   MRN: [id number]   02/06/2017.  [name] is a 66 y.o. male admitted for CIR with decreased functional mobility secondary to metabolic encephalopathy.  Past Medical History:  Diagnosis Date  . Arthritis    right hand  . Cancer [facility])    prostate  . Chronic kidney disease    Dialysis M/w/F, [facility]  . COPD (chronic obstructive pulmonary disease) ([place])   . Diabetes mellitus    Type 2   . GERD (gastroesophageal reflux disease)   . High cholesterol   . Hypertension   . Shortness of breath dyspnea       Subjective: Doing well.  Only complaint is some mild generalized achiness.  Objective: Vital signs in last 24 hours: Temp:  [98 F (36.7 C)-99 F (37.2 C)] 98.3 F (36.8 C) (12/29 0500) Pulse Rate:  [86-110] 97 (12/29 0500) Resp:  [14-28] 16 (12/29 0500) BP: (142-183)/(65-90) 153/77 (12/29 0500) SpO2:  [97 %-100 %] 100 % (12/29 0500) Weight:  [197 lb 14.4 oz (89.8 kg)-203 lb 4.2 oz (92.2 kg)] 197 lb 14.4 oz (89.8 kg) (12/29 0552) Weight change:  Last BM Date: 02/03/17  Intake/Output from previous day: 12/28 0701 - 12/29 0700 In: 1515 [P.O.:1515] Out: 1709 [Urine:400] Last cbgs: CBG (last 3)  Recent Labs    02/05/17 1706 02/05/17 2342 02/06/17 0647  GLUCAP 145* 119* 90     Physical Exam General: No apparent distress overweight HEENT: Unremarkable Lungs: Normal effort. Lungs clear to auscultation, no crackles or wheezes. Cardiovascular: Regular rate and rhythm, no edema Abdomen: S/NT/ND; BS(+) Musculoskeletal: Hard cast left lower extremity Neurological: No new neurological deficits Wounds: N/A    Skin: clear; access line right upper chest wall Mental state: Alert, oriented, cooperative    Lab Results: BMET    Component Value Date/Time   NA 132 (L) 02/05/2017 2025   K 5.0 02/05/2017 2025   CL 96 (L) 02/05/2017 2025   CO2 23 02/05/2017 2025   GLUCOSE 112 (H) 02/05/2017 2025   BUN 57  (H) 02/05/2017 2025   CREATININE 10.22 (H) 02/05/2017 2025   CALCIUM 9.6 02/05/2017 2025   GFRNONAA 5 (L) 02/05/2017 2025   GFRAA 5 (L) 02/05/2017 2025   CBC    Component Value Date/Time   WBC 8.9 02/05/2017 2025   RBC 2.70 (L) 02/05/2017 2025   HGB 9.3 (L) 02/05/2017 2025   HCT 27.5 (L) 02/05/2017 2025   HCT 36.6 (L) 01/21/2017 1013   PLT 313 02/05/2017 2025   MCV 101.9 (H) 02/05/2017 2025   MCH 34.4 (H) 02/05/2017 2025   MCHC 33.8 02/05/2017 2025   RDW 14.3 02/05/2017 2025   LYMPHSABS 1.5 02/03/2017 1432   MONOABS 0.9 02/03/2017 1432   EOSABS 0.2 02/03/2017 1432   BASOSABS 0.0 02/03/2017 1432    Medications: I have reviewed the patient's current medications.  Assessment/Plan:  Functional deficits secondary to metabolic encephalopathy.  Continue CIR Essential hypertension.  Continue present regimen diabetes mellitus. Tight control.  No change in therapy End-stage renal disease.  Continue HD per renal service Status post left distal fibular fracture.  Continue nonweightbearing status.  Follow-up orthopedics as outpatient    Length of stay, days: [place] , MD 02/06/2017, 8:49 AM

## 2017-02-07 ENCOUNTER — Inpatient Hospital Stay (HOSPITAL_COMMUNITY): Payer: Medicare Other | Admitting: Occupational Therapy

## 2017-02-07 LAB — RENAL FUNCTION PANEL
Albumin: 3.3 g/dL — ABNORMAL LOW (ref 3.5–5.0)
Anion gap: 12 (ref 5–15)
BUN: 49 mg/dL — ABNORMAL HIGH (ref 6–20)
CO2: 23 mmol/L (ref 22–32)
Calcium: 9.4 mg/dL (ref 8.9–10.3)
Chloride: 95 mmol/L — ABNORMAL LOW (ref 101–111)
Creatinine, Ser: 9.73 mg/dL — ABNORMAL HIGH (ref 0.61–1.24)
GFR calc Af Amer: 6 mL/min — ABNORMAL LOW (ref >60)
GFR calc non Af Amer: 5 mL/min — ABNORMAL LOW (ref >60)
Glucose, Bld: 101 mg/dL — ABNORMAL HIGH (ref 65–99)
Phosphorus: 5.3 mg/dL — ABNORMAL HIGH (ref 2.5–4.6)
Potassium: 4.8 mmol/L (ref 3.5–5.1)
Sodium: 130 mmol/L — ABNORMAL LOW (ref 135–145)

## 2017-02-07 LAB — GLUCOSE, CAPILLARY
GLUCOSE-CAPILLARY: 111 mg/dL — AB (ref 65–99)
Glucose-Capillary: 100 mg/dL — ABNORMAL HIGH (ref 65–99)
Glucose-Capillary: 101 mg/dL — ABNORMAL HIGH (ref 65–99)
Glucose-Capillary: 131 mg/dL — ABNORMAL HIGH (ref 65–99)

## 2017-02-07 NOTE — Plan of Care (Signed)
  Progressing RH SKIN INTEGRITY RH STG MAINTAIN SKIN INTEGRITY WITH ASSISTANCE Description STG Maintain Skin Integrity With min Assistance.  02/07/2017 2240 - Progressing by Edd Arbour, RN RH SAFETY RH STG DECREASED RISK OF FALL WITH ASSISTANCE Description STG Decreased Risk of Fall With mod Assistance.  02/07/2017 2240 - Progressing by Edd Arbour, RN RH PAIN MANAGEMENT RH STG PAIN MANAGED AT OR BELOW PT'S PAIN GOAL Description <3 on a 0-10 pain scale  02/07/2017 2240 - Progressing by Edd Arbour, RN

## 2017-02-07 NOTE — Progress Notes (Signed)
Patient ID: [name], male   DOB: 03/25/1950, 66 y.o.   MRN: [id number]   [name] is a 66 y.o. male who is admitted for CIR with decreased functional mobility secondary to metabolic encephalopathy  Past Medical History:  Diagnosis Date  . Arthritis    right hand  . Cancer [facility])    prostate  . Chronic kidney disease    Dialysis M/w/F, [facility]  . COPD (chronic obstructive pulmonary disease) ([employer])   . Diabetes mellitus    Type 2   . GERD (gastroesophageal reflux disease)   . High cholesterol   . Hypertension   . Shortness of breath dyspnea      Subjective: No new complaints. No new problems.  Asking about discharge planning.  Objective: Vital signs in last 24 hours: Temp:  [98.2 F (36.8 C)-98.4 F (36.9 C)] 98.4 F (36.9 C) (12/30 0450) Pulse Rate:  [80-89] 88 (12/30 0450) Resp:  [18] 18 (12/30 0450) BP: (136-172)/(58-76) 158/70 (12/30 0450) SpO2:  [98 %-100 %] 98 % (12/30 0450) Weight change:  Last BM Date: 02/05/17(per patient)  Intake/Output from previous day: 12/29 0701 - 12/30 0700 In: 1200 [P.O.:1200] Out: 700 [Urine:700] Last cbgs: CBG (last 3)  Recent Labs    02/06/17 1654 02/06/17 2101 02/07/17 0618  GLUCAP 117* 121* 100*   Patient Vitals for the past 24 hrs:  BP Temp Temp src Pulse Resp SpO2  02/07/17 0450 (!) 158/70 98.4 F (36.9 C) Oral 88 18 98 %  02/06/17 2123 (!) 143/70 - - 80 - -  02/06/17 1455 (!) 136/58 98.2 F (36.8 C) Oral 81 18 100 %  02/06/17 0941 (!) 172/76 - - 89 - -    Physical Exam General: No apparent distress alert.  Obese HEENT: not dry Lungs: Normal effort. Lungs clear to auscultation, no crackles or wheezes. Cardiovascular: Regular rate and rhythm, no edema Abdomen: S/NT/ND; BS(+) Musculoskeletal:  Unchanged; hard cast left lower extremity Neurological: No new neurological deficits Wounds: N/A    Skin: clear HD catheter right upper anterior chest wall; maturing aVF right antecubital area Mental state: Alert,  oriented, cooperative    Lab Results: BMET    Component Value Date/Time   NA 130 (L) 02/07/2017 0612   K 4.8 02/07/2017 0612   CL 95 (L) 02/07/2017 0612   CO2 23 02/07/2017 0612   GLUCOSE 101 (H) 02/07/2017 0612   BUN 49 (H) 02/07/2017 0612   CREATININE 9.73 (H) 02/07/2017 0612   CALCIUM 9.4 02/07/2017 0612   GFRNONAA 5 (L) 02/07/2017 0612   GFRAA 6 (L) 02/07/2017 0612   CBC    Component Value Date/Time   WBC 8.9 02/05/2017 2025   RBC 2.70 (L) 02/05/2017 2025   HGB 9.3 (L) 02/05/2017 2025   HCT 27.5 (L) 02/05/2017 2025   HCT 36.6 (L) 01/21/2017 1013   PLT 313 02/05/2017 2025   MCV 101.9 (H) 02/05/2017 2025   MCH 34.4 (H) 02/05/2017 2025   MCHC 33.8 02/05/2017 2025   RDW 14.3 02/05/2017 2025   LYMPHSABS 1.5 02/03/2017 1432   MONOABS 0.9 02/03/2017 1432   EOSABS 0.2 02/03/2017 1432   BASOSABS 0.0 02/03/2017 1432     Medications: I have reviewed the patient's current medications.  Assessment/Plan:  Functional deficits secondary to severe metabolic encephalopathy.  Continue CIR Essential hypertension.  Reasonable control Diabetes mellitus.  Remains very well controlled ESRD.  Continue HD Status post left distal fibular fracture.  Continue nonweightbearing status.  Follow-up orthopedics as  outpatient    Length of stay, days: [place] , MD 02/07/2017, 8:36 AM

## 2017-02-07 NOTE — Progress Notes (Signed)
Occupational Therapy Session Note  Patient Details  Name: [name] MRN: [id number] Date of Birth: 05/14/1950  Today's Date: 02/07/2017 OT Individual Time: 1359-1456 OT Individual Time Calculation (min): 57 min   Short Term Goals: Week 1:  OT Short Term Goal 1 (Week 1): Pt will perform LB dressing with min A overall.  OT Short Term Goal 2 (Week 1): Pt will perform toilet transfer with use of RW with steady assistance for balance.  OT Short Term Goal 3 (Week 1): Pt will verbalize and demonstrate NWB L LE with min cues for safety with functional transfers.   Skilled Therapeutic Interventions/Progress Updates:    Pt greeted asleep in bed, LEs dangling off of bed on Lt side. He was easily awoken, and required encouragement to participate. Once CAM boot was donned, he completed squat pivot<w/c with Min A while OT elevated Lt foot off of floor. He self propelled to gym for UB strengthening, required cues for safety awareness on Rt side and also to increase efficiency with propulsion. Once in gym, worked on pt static standing with unilateral support on RW in prep for dressing skills. Used visual biofeedback with mirror for maintaining NWB precautions with L LE. He responded well to cues to attend to mirror and lifted boot from floor when he noticed precaution violation. Upgraded task to involve using reacher for threading theraband over LEs while seated, and lifting theraband over hips with unilateral support on RW. This is still too challenging for him without breaking precautions. He then reported needing to void. He self propelled to therapy apartment and completed squat pivot<standard toilet with OT keeping L LE elevated. Pt voiding and completed clothing mgt in high squat position, then transferred back to w/c. OT held L LE throughout to maintain NWB. He was then escorted back to room. Discussed his trigger finger symptoms with RN (Lt hand, 2nd digit, swollen and triggering). Pt left with in w/c with  chair alarm set, safety belt, and all needs within reach.    Therapy Documentation Precautions:  Precautions Precautions: Fall Required Braces or Orthoses: Other Brace/Splint Other Brace/Splint: Short leg cast/CAM boot order Restrictions Weight Bearing Restrictions: Yes LLE Weight Bearing: Non weight bearing Vital Signs: Therapy Vitals Temp: 98.2 F (36.8 C) Temp Source: Oral Pulse Rate: 79 Resp: 14 BP: 137/63 Patient Position (if appropriate): Sitting Oxygen Therapy SpO2: 100 % O2 Device: Not Delivered Pain: 2nd digit. RN made aware. Note left for MD.   ADL:   See Function Navigator for Current Functional Status.   Therapy/Group: Individual Therapy  [name] 02/07/2017, 3:50 PM

## 2017-02-07 NOTE — Progress Notes (Signed)
Physical Therapy Session Note  Patient Details  Name: [name] MRN: [id number] Date of Birth: 01/30/1951  Today's Date: 02/06/2017 PT Individual Time: 1445-1545   60 min   Short Term Goals: Week 1:  PT Short Term Goal 1 (Week 1): pt will stand pivot transfer with LRAD, NWBing LLE without physical assistance PT Short Term Goal 2 (Week 1): pt will propel w/c on level tile x 150' with supervision PT Short Term Goal 3 (Week 1): pt will perform gait x 30' with min assist, LRAD, NWBing LLE without physical assistance, with orthosis to hold up LLE PRN  Skilled Therapeutic Interventions/Progress Updates:   Pt received sitting in WC and agreeable to PT. PT instructed pt in WC mobility 172f +1013fwith supervision assist. BUE ergometer 2 bout of 2 min forward and 2 min backwards. Sit<>stand transfer training x 6 with min-mod assist and moderate cues for NWB. Pt unable to retain instruction for NWB with transfers >5 minutes. Gait training with RW [id number] min assist from PT. Cues for technique and increased use of BUE. Cognitive task to complete pipe tree of moderate difficulty. Min questioning cues for problem solving. Patient returned to room and [facility] reach and all needs met.        Therapy Documentation Precautions:  Precautions Precautions: Fall Required Braces or Orthoses: Other Brace/Splint Other Brace/Splint: Short leg cast/CAM boot order Restrictions Weight Bearing Restrictions: Yes LLE Weight Bearing: Non weight bearing Vital Signs: Therapy Vitals Temp: 98.4 F (36.9 C) Temp Source: Oral Pulse Rate: 88 Resp: 18 BP: (!) 158/70 Patient Position (if appropriate): Sitting Oxygen Therapy SpO2: 98 % O2 Device: Not Delivered Pain: 0/10     See Function Navigator for Current Functional Status.   Therapy/Group: Individual Therapy  AustLorie Phenix30/2018, 6:08 AM

## 2017-02-08 ENCOUNTER — Inpatient Hospital Stay (HOSPITAL_COMMUNITY): Payer: Medicare Other | Admitting: Occupational Therapy

## 2017-02-08 ENCOUNTER — Inpatient Hospital Stay (HOSPITAL_COMMUNITY): Payer: Medicare Other | Admitting: Speech Pathology

## 2017-02-08 ENCOUNTER — Inpatient Hospital Stay (HOSPITAL_COMMUNITY): Payer: Medicare Other

## 2017-02-08 ENCOUNTER — Inpatient Hospital Stay (HOSPITAL_COMMUNITY): Payer: Medicare Other | Admitting: Physical Therapy

## 2017-02-08 LAB — URINALYSIS, ROUTINE W REFLEX MICROSCOPIC
BILIRUBIN URINE: NEGATIVE
Glucose, UA: 50 mg/dL — AB
Ketones, ur: NEGATIVE mg/dL
Leukocytes, UA: NEGATIVE
Nitrite: NEGATIVE
PROTEIN: 100 mg/dL — AB
SPECIFIC GRAVITY, URINE: 1.005 (ref 1.005–1.030)
pH: 7 (ref 5.0–8.0)

## 2017-02-08 LAB — GLUCOSE, CAPILLARY
GLUCOSE-CAPILLARY: 94 mg/dL (ref 65–99)
GLUCOSE-CAPILLARY: 94 mg/dL (ref 65–99)
GLUCOSE-CAPILLARY: 123 mg/dL — AB (ref 65–99)
Glucose-Capillary: 93 mg/dL (ref 65–99)

## 2017-02-08 MED ORDER — HEPARIN SODIUM (PORCINE) 1000 UNIT/ML DIALYSIS
20.0000 [IU]/kg | INTRAMUSCULAR | Status: DC | PRN
  Administered 2017-02-08: 1800 [IU] via INTRAVENOUS_CENTRAL
  Filled 2017-02-08 (×2): qty 2

## 2017-02-08 MED ORDER — SODIUM CHLORIDE 0.9 % IV SOLN: 100.0000 mL | INTRAVENOUS | Status: DC | PRN

## 2017-02-08 MED ORDER — HYDRALAZINE HCL 25 MG PO TABS
25.0000 mg | ORAL_TABLET | Freq: Three times a day (TID) | ORAL | Status: DC
  Administered 2017-02-08 – 2017-02-16 (×23): 25 mg via ORAL
  Filled 2017-02-08 (×23): qty 1

## 2017-02-08 NOTE — Progress Notes (Signed)
Speech Language Pathology Daily Session Note  Patient Details  Name: [name] MRN: [id number] Date of Birth: 1950/05/18  Today's Date: 02/08/2017 SLP Individual Time: 0725-0805 SLP Individual Time Calculation (min): 40 min  Short Term Goals: Week 1: SLP Short Term Goal 1 (Week 1): Patient will demonstrate functional problem solving for basic and famliar tasks with Min A verbal cues.  SLP Short Term Goal 2 (Week 1): Patient will utilize external aids to recall new, daily information with mod A verbal cues.  SLP Short Term Goal 3 (Week 1): Patient will self-monitor and correct errors during functional tasks with Min A verbal cues.  SLP Short Term Goal 4 (Week 1): Patient will demonstrate sustained attention to functional tasks for ~30 minutes with Min A verbal cues for redirection.   Skilled Therapeutic Interventions: Skilled treatment session focused on cognitive goals. SLP facilitated session by providing extra time and Mod A verbal and visual cues for problem solving during a basic money management task. Patient's function impacted by decreased selective attention to task in a moderately distracting environment which patient independently self-monitoring and corrected. Patient left supine in bed with alarm on. Continue with current plan of care.       Function:    Cognition Comprehension Comprehension assist level: Understands basic 50 - 74% of the time/ requires cueing 25 - 49% of the time  Expression   Expression assist level: Expresses basic 75 - 89% of the time/requires cueing 10 - 24% of the time. Needs helper to occlude trach/needs to repeat words.  Social Interaction Social Interaction assist level: Interacts appropriately 75 - 89% of the time - Needs redirection for appropriate language or to initiate interaction.  Problem Solving Problem solving assist level: Solves basic 75 - 89% of the time/requires cueing 10 - 24% of the time  Memory Memory assist level: Recognizes or  recalls 75 - 89% of the time/requires cueing 10 - 24% of the time    Pain No/Denies Pain   Therapy/Group: Individual Therapy  Genaro Bekker 02/08/2017, 8:09 AM

## 2017-02-08 NOTE — Progress Notes (Signed)
Physical Therapy Session Note  Patient Details  Name: XZAVIOR REINIG MRN: [id number] Date of Birth: [birth year]  Today's Date: 02/08/2017 PT Individual Time: 1100-1120 PT Individual Time Calculation (min): 20 min (makeup time)  Short Term Goals: Week 1:  PT Short Term Goal 1 (Week 1): pt will stand pivot transfer with LRAD, NWBing LLE without physical assistance PT Short Term Goal 2 (Week 1): pt will propel w/c on level tile x 150' with supervision PT Short Term Goal 3 (Week 1): pt will perform gait x 30' with min assist, LRAD, NWBing LLE without physical assistance, with orthosis to hold up LLE PRN  Skilled Therapeutic Interventions/Progress Updates:    no c/o pain, but lethargic.  Declines out of bed but agreeable to bed level therex.    Pt completes 10 reps RLE ankle pumps, BLE quad sets, and BLE SLR with max multimodal cues to stay awake.  Pt continually falling asleep after 1-2 reps of each exercise despite HOB elevated and therapist talking to pt.  Left positioned semi-recumbent in bed with call bell in reach and needs met.   Therapy Documentation Precautions:  Precautions Precautions: Fall Required Braces or Orthoses: Other Brace/Splint Other Brace/Splint: Short leg cast/CAM boot order Restrictions Weight Bearing Restrictions: Yes LLE Weight Bearing: Non weight bearing  See Function Navigator for Current Functional Status.   Therapy/Group: Individual Therapy  Michel Santee 02/08/2017, 11:29 AM

## 2017-02-08 NOTE — Progress Notes (Signed)
[facility] PHYSICAL MEDICINE & REHABILITATION     PROGRESS NOTE  Subjective/Complaints:  Pt seen sitting up at EOB this AM, working with SLP.  He slept well overnight and had a good night.  He has questions about his cast.   ROS: Denies CP, SOB, nausea, vomiting, diarrhea.  Objective: Vital Signs: Blood pressure (!) 144/76, pulse 85, temperature 98.5 F (36.9 C), temperature source Oral, resp. rate 18, height 5\' 9"  (1.753 m), weight 89.8 kg (197 lb 14.4 oz), SpO2 97 %. No results found. Recent Labs    02/05/17 2025  WBC 8.9  HGB 9.3*  HCT 27.5*  PLT 313   Recent Labs    02/05/17 2025 02/07/17 0612  NA 132* 130*  K 5.0 4.8  CL 96* 95*  GLUCOSE 112* 101*  BUN 57* 49*  CREATININE 10.22* 9.73*  CALCIUM 9.6 9.4   CBG (last 3)  Recent Labs    02/07/17 1632 02/07/17 2100 02/08/17 0633  GLUCAP 111* 131* 94    Wt Readings from Last 3 Encounters:  02/06/17 89.8 kg (197 lb 14.4 oz)  02/03/17 88.7 kg (195 lb 8.8 oz)  01/19/17 95.7 kg (211 lb)    Physical Exam:  BP (!) 144/76 (BP Location: Left Arm)   Pulse 85   Temp 98.5 F (36.9 C) (Oral)   Resp 18   Ht 5\' 9"  (1.753 m)   Wt 89.8 kg (197 lb 14.4 oz)   SpO2 97%   BMI 29.22 kg/m  Constitutional: He appears well-developed. Obese  HENT: Normocephalic and atraumatic.  Eyes: EOM are normal. No discharge.  Cardiovascular: RRR. No JVD Respiratory: Effort normal and breath sounds normal.  GI: Soft. Bowel sounds are normal.  Musculoskeletal: No edema. No tenderness.  Neurological: He is alert and oriented.  Motor: Bilateral upper extremities 5/5 proximal distal Left lower extremity: 5/5 proximal distal (stable) Right lower extremity: 4+-5/5 proximally, ankle limited by cast.  Skin: Distal LLE with cast Psychiatric: His affect is blunt. He is slowed.    Assessment/Plan: 1. Functional deficits secondary to metabolic encephalopathy which require 3+ hours per day of interdisciplinary therapy in a comprehensive  inpatient rehab setting. Physiatrist is providing close team supervision and 24 hour management of active medical problems listed below. Physiatrist and rehab team continue to assess barriers to discharge/monitor patient progress toward functional and medical goals.  Function:  Bathing Bathing position   Position: Sitting EOB  Bathing parts Body parts bathed by patient: Right arm, Left arm, Chest, Abdomen, Front perineal area, Buttocks, Right upper leg, Left upper leg Body parts bathed by helper: Back, Right lower leg  Bathing assist Assist Level: Touching or steadying assistance(Pt > 75%)      Upper Body Dressing/Undressing Upper body dressing   What is the patient wearing?: Pull over shirt/dress     Pull over shirt/dress - Perfomed by patient: Thread/unthread right sleeve, Thread/unthread left sleeve, Put head through opening, Pull shirt over trunk          Upper body assist Assist Level: Supervision or verbal cues   Set up : To obtain clothing/put away  Lower Body Dressing/Undressing Lower body dressing   What is the patient wearing?: Non-skid slipper socks, Underwear, Pants, AFO Underwear - Performed by patient: Thread/unthread right underwear leg, Pull underwear up/down Underwear - Performed by helper: Thread/unthread left underwear leg   Pants- Performed by helper: Thread/unthread right pants leg, Thread/unthread left pants leg, Pull pants up/down   Non-skid slipper socks- Performed by helper: Don/doff right  sock           AFO - Performed by helper: Don/doff left AFO(AFO=CAM boot)      Lower body assist Assist for lower body dressing: (Max A)      Toileting Toileting Toileting activity did not occur: No continent bowel/bladder event Toileting steps completed by patient: Adjust clothing prior to toileting, Performs perineal hygiene, Adjust clothing after toileting   Toileting Assistive Devices: Other (comment)(urinal)  Toileting assist Assist level: Touching  or steadying assistance (Pt.75%)   Transfers Chair/bed transfer   Chair/bed transfer method: Squat pivot Chair/bed transfer assist level: Touching or steadying assistance (Pt > 75%) Chair/bed transfer assistive device: Armrests, Orthosis     Locomotion Ambulation     Max distance: 38' Assist level: Touching or steadying assistance (Pt > 75%)   Wheelchair   Type: Manual Max wheelchair distance: 120' Assist Level: Supervision or verbal cues  Cognition Comprehension Comprehension assist level: Understands basic 50 - 74% of the time/ requires cueing 25 - 49% of the time  Expression Expression assist level: Expresses basic 75 - 89% of the time/requires cueing 10 - 24% of the time. Needs helper to occlude trach/needs to repeat words.  Social Interaction Social Interaction assist level: Interacts appropriately 75 - 89% of the time - Needs redirection for appropriate language or to initiate interaction.  Problem Solving Problem solving assist level: Solves basic 75 - 89% of the time/requires cueing 10 - 24% of the time  Memory Memory assist level: Recognizes or recalls 75 - 89% of the time/requires cueing 10 - 24% of the time    Medical Problem List and Plan: 1.  Decreased functional mobility secondary to metabolic encephalopathy.   Cont CIR 2.  DVT Prophylaxis/Anticoagulation: Subcutaneous heparin. Monitor for any bleeding episodes 3. Pain Management: Tylenol as needed 4. Mood: Provide emotional support 5. Neuropsych: This patient is not fully capable of making decisions on his own behalf. 6. Skin/Wound Care: Routine skin checks 7. Fluids/Electrolytes/Nutrition: Routine I&O's 8. End-stage renal disease. Continue hemodialysis as per renal services 9. Hypertension. Toprol 12.5 mg Tuesday Thursday Saturday and Sunday, Norvasc 10 mg daily   Hydralazine 10 TID started on 12/28, increased to 25 on 12/31 10. Diabetes mellitus.SSI. Check blood sugars before meals and at bedtime   Relatively  controlled on 12/31 11. Closed Left distal fibular fracture/nondisplaced. Nonweightbearing. Patient will need follow-up with orthopedic services. 12. Hyperlipidemia. Lipitor 13. Leukocytosis: Resolved    WBCs 7.9 on 12/26 14. Anemia of chronic disease   Hemoglobin 9.3 on 12/28   Labs with HD   Continue to monitor 15. Obesity   Body mass index is 29.22 kg/m.   Diet and exercise education   Will cont to encourage weight loss to increase endurance and promote overall health  LOS (Days) 5 A FACE TO FACE EVALUATION WAS PERFORMED  Bellany Elbaum Lorie Phenix 02/08/2017 10:18 AM   '

## 2017-02-08 NOTE — Progress Notes (Signed)
Pt. Verbalized not feeling well,VS were with the normal range.[name] PAC was notify,orders were place for UA and culture also 2 view chest X ray.Pt has been urinating multiple times with several incontinence episodes.Keep monitoring pt. Closely.

## 2017-02-08 NOTE — Progress Notes (Signed)
Occupational Therapy Session Note  Patient Details  Name: [name] MRN: [id number] Date of Birth: [birth year]  Today's Date: 02/08/2017 OT Individual Time: 7902-4097 OT Individual Time Calculation (min): 55 min    Short Term Goals: Week 1:  OT Short Term Goal 1 (Week 1): Pt will perform LB dressing with min A overall.  OT Short Term Goal 2 (Week 1): Pt will perform toilet transfer with use of RW with steady assistance for balance.  OT Short Term Goal 3 (Week 1): Pt will verbalize and demonstrate NWB L LE with min cues for safety with functional transfers.   Skilled Therapeutic Interventions/Progress Updates:    Upon entering the room, pt seated on EOB awaiting therapist arrival. Pt with no c/o pain this session and agreeable to OT intervention. Pt required mod multimodal cues for problem solving throughout session for safety awareness. Pt stating his precautions as, " I can put some weight through it but not all of it." OT educating pt again on NWB status. Pt unable to maintain NWB status during any functional transfers or standing this session with therapist placing foot underneath. Pt realizes he is bearing weight and states, " This foot is just heavy ". Pt bathing self from seated position on EOB with set up A and min A for LB. Pt standing to pull LB clothing over B hips with min - mod A for standing balance and unable to maintain NWB status. Pt performed lateral scoot transfer into wheelchair and seated at sink for grooming tasks at mod I level. Pt donning B leg rests with increased time and cues for proper technique. Pt remained seated in wheelchair with quick release belt donned and call bell within reach.   Therapy Documentation Precautions:  Precautions Precautions: Fall Required Braces or Orthoses: Other Brace/Splint Other Brace/Splint: Short leg cast/CAM boot order Restrictions Weight Bearing Restrictions: Yes LLE Weight Bearing: Non weight bearing General:   Vital  Signs:  Pain:   ADL:   Vision   Perception    Praxis   Exercises:   Other Treatments:    See Function Navigator for Current Functional Status.   Therapy/Group: Individual Therapy  [name] 02/08/2017, 10:52 AM

## 2017-02-08 NOTE — Progress Notes (Addendum)
Physical Therapy Note  Patient Details  Name: [name] MRN: [id number] Date of Birth: 19-Sep-1950 Today's Date: 02/08/2017  tx 1:  1134-1204, 30 min individual tx Pain: pt grimaced as he was urinating  Pt lethargic, supine in bed.  He initially refused tx as he felt cold, but ater PT provided him with more blankets, he eventually agreed to bedside tx.  Neuromuscular re-education via mulimodal cues for active assistive L:  straight leg raises, L hip abd/aduction, L short arc quad knee extension; active R unilateral bridging, modified abdominal crunches, bil lower trunk rotation with PT holding LLE slightly off of bed. PT urged pt to transfer to w/c to eat lunch, but pt stated he had been urinating "a whole lot lately" and just couldn't get OOB.  Pt stated he had to urinate. PT provided pt with urinal; he stated urinating was painful. He was continent of a small amount of urine.  [name], RN informed. Pt left resting in bed with alarm set and all needs within reach, using urinal again.  tx 2:  1345-1400, 15 min individual tx Missed 45 min due to fatigue/declined  PT consulted with [name], RN.  [name], PA has ordered UA and chest Xray for pt.  Pt willing to participate initially, bedside, although he stated he "felt bad" and was cold and tired..  Pt rolled R with cues and PT placed pillow between knees and LLs.  Pt performed 5 x 1 active assistive clam shells for L hip abductor activation, then fell asleep.  Pt left resting in bed with alarm set and and all needs within reach.  See function navigator for current status.  [name] 02/08/2017, 8:28 AM

## 2017-02-08 NOTE — Progress Notes (Signed)
Patient ID: [name], male   DOB: 09-07-1950, 66 y.o.   MRN: [id number]  [facility] Progress Note    Subjective:   Feels weak.   Objective:   BP 137/75 (BP Location: Left Arm)   Pulse 90   Temp 98.7 F (37.1 C) (Oral)   Resp 18   Ht 5\' 9"  (1.753 m)   Wt 89.8 kg (197 lb 14.4 oz)   SpO2 98%   BMI 29.22 kg/m   Intake/Output: I/O last 3 completed shifts: In: 1200 [P.O.:1200] Out: 6962 [Urine:1650]   Intake/Output this shift:  Total I/O In: 360 [P.O.:360] Out: 250 [Urine:250] Weight change:   Physical Exam: Gen: NAD CVS: no rub Resp:cta XBM:WUXLKG Ext: no edema, RUE avf +T/B, RIJ tdc in place with dressing, no discharge  Labs: BMET Recent Labs  Lab 02/03/17 1432 02/05/17 2025 02/07/17 0612  NA 132* 132* 130*  K 4.1 5.0 4.8  CL 93* 96* 95*  CO2 27 23 23   GLUCOSE 155* 112* 101*  BUN 21* 57* 49*  CREATININE 5.54* 10.22* 9.73*  ALBUMIN 3.3* 3.5 3.3*  CALCIUM 8.3* 9.6 9.4  PHOS 3.5 6.0* 5.3*   CBC Recent Labs  Lab 02/03/17 1432 02/05/17 2025  WBC 7.9 8.9  NEUTROABS 5.3  --   HGB 9.8* 9.3*  HCT 28.3* 27.5*  MCV 101.1* 101.9*  PLT 310 313    @IMGRELPRIORS @ Medications:    . amLODipine  10 mg Oral Daily  . atorvastatin  20 mg Oral q1800  . calcitRIOL  2 mcg Oral Q M,W,F  . calcium acetate  1,334 mg Oral TID WC  . cinacalcet  120 mg Oral Q M,W,F  . darbepoetin (ARANESP) injection - DIALYSIS  60 mcg Intravenous Q Fri-HD  . folic acid  1 mg Oral Daily  . heparin  5,000 Units Subcutaneous Q8H  . hydrALAZINE  25 mg Oral Q8H  . insulin aspart  0-15 Units Subcutaneous TID AC & HS  . metoprolol succinate  12.5 mg Oral Q T,Th,S,Su  . pantoprazole  40 mg Oral Daily  . tamsulosin  0.4 mg Oral QPC supper   MWF [facility] 4h 73min 95.5kg 2/2.5 Hep 8600 R IJ cath(andRt BCF recently revised by Dr. Donzetta Matters + PTA by Dr. Scot Dock) -calc 2 ug -mirc 50 given 11/28 -sensipar 120 tiw po    Assessment/ Plan:   1. AMS resolved 2. L distal  fibula fracture- s/p short leg cast and non-weight bearing PT 3. ESRD continue with HD qMWF 4. Anemia: on ESA 5. CKD-MBD: cont with binders/VDRA/sensipar 6. Nutrition: renal diet 7. Hypertension: stable.  Below edw. 8. COPD 9. DM type 2- per primary 10. Disposition- currently in CIR cont with PT/OT, not putting much effort into exercises.  Donetta Potts, MD [place] Pager [phone] 02/08/2017, 2:26 PM

## 2017-02-09 ENCOUNTER — Inpatient Hospital Stay (HOSPITAL_COMMUNITY): Payer: Medicare Other | Admitting: Physical Therapy

## 2017-02-09 ENCOUNTER — Other Ambulatory Visit: Payer: Self-pay

## 2017-02-09 ENCOUNTER — Inpatient Hospital Stay (HOSPITAL_COMMUNITY): Payer: Medicare Other

## 2017-02-09 ENCOUNTER — Inpatient Hospital Stay (HOSPITAL_COMMUNITY): Payer: Medicare Other | Admitting: Occupational Therapy

## 2017-02-09 LAB — URINE CULTURE

## 2017-02-09 LAB — RENAL FUNCTION PANEL
Albumin: 3.6 g/dL (ref 3.5–5.0)
Anion gap: 16 — ABNORMAL HIGH (ref 5–15)
BUN: 70 mg/dL — ABNORMAL HIGH (ref 6–20)
CO2: 22 mmol/L (ref 22–32)
Calcium: 9.9 mg/dL (ref 8.9–10.3)
Chloride: 92 mmol/L — ABNORMAL LOW (ref 101–111)
Creatinine, Ser: 12.8 mg/dL — ABNORMAL HIGH (ref 0.61–1.24)
GFR calc Af Amer: 4 mL/min — ABNORMAL LOW (ref >60)
GFR calc non Af Amer: 3 mL/min — ABNORMAL LOW (ref >60)
Glucose, Bld: 97 mg/dL (ref 65–99)
Phosphorus: 5.4 mg/dL — ABNORMAL HIGH (ref 2.5–4.6)
Potassium: 5.2 mmol/L — ABNORMAL HIGH (ref 3.5–5.1)
Sodium: 130 mmol/L — ABNORMAL LOW (ref 135–145)

## 2017-02-09 LAB — GLUCOSE, CAPILLARY
GLUCOSE-CAPILLARY: 90 mg/dL (ref 65–99)
GLUCOSE-CAPILLARY: 125 mg/dL — AB (ref 65–99)
Glucose-Capillary: 86 mg/dL (ref 65–99)
Glucose-Capillary: 111 mg/dL — ABNORMAL HIGH (ref 65–99)

## 2017-02-09 LAB — CBC
HCT: 27.5 % — ABNORMAL LOW (ref 39.0–52.0)
Hemoglobin: 9.3 g/dL — ABNORMAL LOW (ref 13.0–17.0)
MCH: 34.3 pg — ABNORMAL HIGH (ref 26.0–34.0)
MCHC: 33.8 g/dL (ref 30.0–36.0)
MCV: 101.5 fL — ABNORMAL HIGH (ref 78.0–100.0)
Platelets: 330 K/uL (ref 150–400)
RBC: 2.71 MIL/uL — ABNORMAL LOW (ref 4.22–5.81)
RDW: 14.3 % (ref 11.5–15.5)
WBC: 9.3 10*3/uL (ref 4.0–10.5)

## 2017-02-09 MED ORDER — CALCITRIOL 0.5 MCG PO CAPS
1.5000 ug | ORAL_CAPSULE | ORAL | Status: DC
  Administered 2017-02-10 – 2017-02-12 (×2): 1.5 ug via ORAL
  Filled 2017-02-09 (×2): qty 3
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
## 2017-02-09 NOTE — Progress Notes (Signed)
Social Work Patient ID: [name], male   DOB: [birth year], 67 y.o.   MRN: [id number]   Met with patient and spoke with daughter yesterday to review team conference information.  They are both aware and agreeable with targeted DC date of 02/16/17 and supervision goals.  Daughter confirms that she will be providing 24/7 supervision for her father at home.  Will continue to follow for support and discharge planning needs.  Tanina Barb, LCSW

## 2017-02-09 NOTE — Progress Notes (Signed)
HD tx initiated via HD cath w/o problem, pull/push/flush equally sluggish, lines reversed, VSS, will cont to monitor while on HD tx, pt did arrive to unit already complaining of 4 hr tx, explained to him that Friday when I ran his tx that he came off 2 hours early so he really needs to try and stay on entire tx, pt grumbled

## 2017-02-09 NOTE — Progress Notes (Signed)
Physical Therapy Note  Patient Details  Name: [name] MRN: [id number] Date of Birth: 1950/10/28 Today's Date: 02/09/2017    Arrived for scheduled PM session.  Pt declined to participate, stating he was done for the day and "all you guys do here is therapy."  PT provided education on purpose of CIR, goals of therapy, and rehab process, as well as education regarding importance of mobilization, but pt continued to decline.  Left seated EOB with alarm activated, call bell in reach and needs met.     Shann Medal, PT, DPT  02/09/2017, 3:57 PM

## 2017-02-09 NOTE — Progress Notes (Signed)
Social Work Patient ID: [name], male   DOB: [birth year], 67 y.o.   MRN: [id number]    [name]  Social Worker  General Practice  Patient Care Conference  Signed  Date of Service:  02/09/2017 12:26 PM          Signed          [] Hide copied text  [] Hover for details   Inpatient RehabilitationTeam Conference and Plan of Care Update Date: 02/08/2018   Time: 10:35 AM      Patient Name: [name]      Medical Record Number: [id number]  Date of Birth: May 25, 1950 Sex: Male         Room/Bed: 4W11C/4W11C-01 Payor Info: Payor: MEDICARE / Plan: MEDICARE PART A AND B / Product Type: *No Product type* /     Admitting Diagnosis: metabolic, Enceph  Admit Date/Time:  02/03/2017  5:16 PM Admission Comments: No comment available    Primary Diagnosis:  <principal problem not specified> Principal Problem: <principal problem not specified>       Patient Active Problem List    Diagnosis Date Noted  . ESRD on dialysis ([place])    . Anemia of chronic disease    . Diabetes mellitus type 2 in obese ([place])    . Metabolic encephalopathy 03/47/4259  . Leukocytosis    . Hyperlipidemia    . Benign essential HTN    . Chronic obstructive pulmonary disease ([place])    . Closed fracture of right distal fibula    . Abnormal swallowing    . Altered mental status    . SIRS (systemic inflammatory response syndrome) ([place]) 01/21/2017  . Hypertensive urgency 01/21/2017  . Closed fracture of distal end of left fibula 01/21/2017  . Anemia due to chronic kidney disease 01/21/2017  . Acute metabolic encephalopathy 56/38/7564  . Hyperkalemia 01/20/2017  . Acute respiratory distress    . End stage renal disease on dialysis ([place])    . Influenza 02/28/2016  . Acute respiratory failure with hypoxia ([place]) 03/14/2015  . Acute renal failure superimposed on stage 4 chronic kidney disease ([place]) 03/13/2015  . Acute renal failure (ARF) ([place]) 03/09/2015  . COPD exacerbation ([place])    . Diabetes mellitus  ([place]) 03/08/2015  . Anemia 03/08/2015  . Acute on chronic renal failure ([place]) 03/08/2015  . Urinary retention 03/08/2015  . Kidney lesion, native, left 03/08/2015  . Pericardial effusion 03/08/2015  . COPD IV / still smoking  03/08/2015  . Cigarette smoker 03/08/2015  . CHRONIC KIDNEY DISEASE STAGE III (MODERATE) 11/06/2009  . ELEVATED PROSTATE SPECIFIC ANTIGEN 11/04/2009  . HYPERLIPIDEMIA 10/10/2009  . ERECTILE DYSFUNCTION 10/10/2009  . Essential hypertension 10/10/2009  . PREDIABETES 10/10/2009  . COLONIC POLYPS, HX OF 10/10/2009      Expected Discharge Date: Expected Discharge Date: 02/16/17   Team Members Present: Physician leading conference: Dr. Delice Lesch Social Worker Present: Lennart Pall, LCSW Nurse Present: Rayetta Humphrey, RN PT Present: Canary Brim, PT OT Present: Benay Pillow, OT SLP Present: Weston Anna, SLP PPS Coordinator present : Daiva Nakayama, RN, CRRN       Current Status/Progress Goal Weekly Team Focus  Medical     Decreased functional mobility secondary to metabolic encephalopathy  Improve mobility, safety, BP  See above   Bowel/Bladder     Patient continent of bowel and bladder. Oliguric. HD MWF  Patient will remain continent of B/B while on rehab  Continue to assist patient to BR/ empty urinal as needed.  Swallow/Nutrition/ Hydration               ADL's     Min A bathing EOB, Supervision UB dressing, Total A LB dressing, Min A squat pivot toilet transfers + toileting tasks   Supervision overall   Safety awareness, balance, precaution adherence during ADL tasks, d/c planning, functional transfers    Mobility     min to mod assist for transfers with RW; min assist squat pivot; min assist short distance gait; poor adherence to NWB status  supervision overall w/c level and short distance household gait  transfers, sit <> stands, adherence to NWB status, functional standing balance, endurance, strengthening, d/c planning, gait   Communication                Safety/Cognition/ Behavioral Observations   Mod A   Supervision  attention, problem solving, awareness, recall    Pain     Patient's pain managed with Tylenol as ordered  Patient will have pain less than 3 on 1-10 scale  Continue to assess pain q shift and PRN and administer meds as ordered.    Skin     No sign of breakdown.  HD cath to right chest.  AV fistula maturing to right arm.   Patient will be free of infection/ breakdown while on rehab  Skin assessments q shift and PRN.       Rehab Goals Patient on target to meet rehab goals: Yes *See Care Plan and progress notes for long and short-term goals.      Barriers to Discharge   Current Status/Progress Possible Resolutions Date Resolved   Physician     Medical stability     See above  Therapies, follow labs, optimize BP meds      Nursing                 PT  Lack of/limited family support;Behavior;Weight bearing restrictions;Hemodialysis                 OT Lack of/limited family support;Behavior               SLP            SW              Discharge Planning/Teaching Needs:  Pt to return to his home and daughter, Beckie Busing, will stay and provide 24/7 supervision  Teaching to be planned closer to d/c.   Team Discussion:  MD adjusting BP meds; monitoring labs and HD.  Patient is minimal assistance overall with self-care.  Having a very difficult time maintaining nonweightbearing status.patient seems to have minimal awareness of his assistance needs and the purpose of therapy as he insist his daughter will do everything for him.  Currently min to mod assist with mobility.  Sit to stand is the hardest movement for him.  Overall goals set for supervision.  Revisions to Treatment Plan:  None    Continued Need for Acute Rehabilitation Level of Care: The patient requires daily medical management by a physician with specialized training in physical medicine and rehabilitation for the following conditions: Daily direction of a  multidisciplinary physical rehabilitation program to ensure safe treatment while eliciting the highest outcome that is of practical value to the patient.: Yes Daily medical management of patient stability for increased activity during participation in an intensive rehabilitation regime.: Yes Daily analysis of laboratory values and/or radiology reports with any subsequent need for medication adjustment of medical intervention for : Blood pressure  problems;Other;Neurological problems   [name] 02/09/2017, 12:26 PM

## 2017-02-09 NOTE — Patient Care Conference (Signed)
Inpatient RehabilitationTeam Conference and Plan of Care Update Date: 02/08/2018   Time: 10:35 AM    Patient Name: [name]      Medical Record Number: [id number]  Date of Birth: 1950-11-05 Sex: Male         Room/Bed: 4W11C/4W11C-01 Payor Info: Payor: MEDICARE / Plan: MEDICARE PART A AND B / Product Type: *No Product type* /    Admitting Diagnosis: metabolic, Enceph  Admit Date/Time:  02/03/2017  5:16 PM Admission Comments: No comment available   Primary Diagnosis:  <principal problem not specified> Principal Problem: <principal problem not specified>  Patient Active Problem List   Diagnosis Date Noted  . ESRD on dialysis ([place])   . Anemia of chronic disease   . Diabetes mellitus type 2 in obese ([place])   . Metabolic encephalopathy 64/33/2951  . Leukocytosis   . Hyperlipidemia   . Benign essential HTN   . Chronic obstructive pulmonary disease ([place])   . Closed fracture of right distal fibula   . Abnormal swallowing   . Altered mental status   . SIRS (systemic inflammatory response syndrome) ([place]) 01/21/2017  . Hypertensive urgency 01/21/2017  . Closed fracture of distal end of left fibula 01/21/2017  . Anemia due to chronic kidney disease 01/21/2017  . Acute metabolic encephalopathy 88/41/6606  . Hyperkalemia 01/20/2017  . Acute respiratory distress   . End stage renal disease on dialysis ([place])   . Influenza 02/28/2016  . Acute respiratory failure with hypoxia ([place]) 03/14/2015  . Acute renal failure superimposed on stage 4 chronic kidney disease ([place]) 03/13/2015  . Acute renal failure (ARF) ([place]) 03/09/2015  . COPD exacerbation ([place])   . Diabetes mellitus ([place]) 03/08/2015  . Anemia 03/08/2015  . Acute on chronic renal failure ([place]) 03/08/2015  . Urinary retention 03/08/2015  . Kidney lesion, native, left 03/08/2015  . Pericardial effusion 03/08/2015  . COPD IV / still smoking  03/08/2015  . Cigarette smoker 03/08/2015  . CHRONIC KIDNEY DISEASE STAGE III (MODERATE)  11/06/2009  . ELEVATED PROSTATE SPECIFIC ANTIGEN 11/04/2009  . HYPERLIPIDEMIA 10/10/2009  . ERECTILE DYSFUNCTION 10/10/2009  . Essential hypertension 10/10/2009  . PREDIABETES 10/10/2009  . COLONIC POLYPS, HX OF 10/10/2009    Expected Discharge Date: Expected Discharge Date: 02/16/17  Team Members Present: Physician leading conference: Dr. Delice Lesch Social Worker Present: Lennart Pall, LCSW Nurse Present: Rayetta Humphrey, RN PT Present: Canary Brim, PT OT Present: Benay Pillow, OT SLP Present: Weston Anna, SLP PPS Coordinator present : Daiva Nakayama, RN, CRRN     Current Status/Progress Goal Weekly Team Focus  Medical   Decreased functional mobility secondary to metabolic encephalopathy  Improve mobility, safety, BP  See above   Bowel/Bladder   Patient continent of bowel and bladder. Oliguric. HD MWF  Patient will remain continent of B/B while on rehab  Continue to assist patient to BR/ empty urinal as needed.    Swallow/Nutrition/ Hydration             ADL's   Min A bathing EOB, Supervision UB dressing, Total A LB dressing, Min A squat pivot toilet transfers + toileting tasks   Supervision overall   Safety awareness, balance, precaution adherence during ADL tasks, d/c planning, functional transfers    Mobility   min to mod assist for transfers with RW; min assist squat pivot; min assist short distance gait; poor adherence to NWB status  supervision overall w/c level and short distance household gait  transfers, sit <> stands, adherence to NWB status, functional  standing balance, endurance, strengthening, d/c planning, gait   Communication             Safety/Cognition/ Behavioral Observations  Mod A   Supervision  attention, problem solving, awareness, recall    Pain   Patient's pain managed with Tylenol as ordered  Patient will have pain less than 3 on 1-10 scale  Continue to assess pain q shift and PRN and administer meds as ordered.    Skin   No sign of breakdown.  HD  cath to right chest.  AV fistula maturing to right arm.   Patient will be free of infection/ breakdown while on rehab  Skin assessments q shift and PRN.      Rehab Goals Patient on target to meet rehab goals: Yes *See Care Plan and progress notes for long and short-term goals.     Barriers to Discharge  Current Status/Progress Possible Resolutions Date Resolved   Physician    Medical stability     See above  Therapies, follow labs, optimize BP meds      Nursing                  PT  Lack of/limited family support;Behavior;Weight bearing restrictions;Hemodialysis                 OT Lack of/limited family support;Behavior                SLP                SW                Discharge Planning/Teaching Needs:  Pt to return to his home and daughter, Beckie Busing, will stay and provide 24/7 supervision  Teaching to be planned closer to d/c.   Team Discussion:  MD adjusting BP meds; monitoring labs and HD.  Patient is minimal assistance overall with self-care.  Having a very difficult time maintaining nonweightbearing status.patient seems to have minimal awareness of his assistance needs and the purpose of therapy as he insist his daughter will do everything for him.  Currently min to mod assist with mobility.  Sit to stand is the hardest movement for him.  Overall goals set for supervision.  Revisions to Treatment Plan:  None    Continued Need for Acute Rehabilitation Level of Care: The patient requires daily medical management by a physician with specialized training in physical medicine and rehabilitation for the following conditions: Daily direction of a multidisciplinary physical rehabilitation program to ensure safe treatment while eliciting the highest outcome that is of practical value to the patient.: Yes Daily medical management of patient stability for increased activity during participation in an intensive rehabilitation regime.: Yes Daily analysis of laboratory values and/or  radiology reports with any subsequent need for medication adjustment of medical intervention for : Blood pressure problems;Other;Neurological problems  Taleen Prosser 02/09/2017, 12:26 PM

## 2017-02-09 NOTE — Progress Notes (Signed)
Speech Language Pathology Daily Session Note  Patient Details  Name: [name] MRN: [id number] Date of Birth: 1950-04-14  Today's Date: 02/09/2017 SLP Individual Time: 0900-0930 SLP Individual Time Calculation (min): 30 min  Short Term Goals: Week 1: SLP Short Term Goal 1 (Week 1): Patient will demonstrate functional problem solving for basic and famliar tasks with Min A verbal cues.  SLP Short Term Goal 2 (Week 1): Patient will utilize external aids to recall new, daily information with mod A verbal cues.  SLP Short Term Goal 3 (Week 1): Patient will self-monitor and correct errors during functional tasks with Min A verbal cues.  SLP Short Term Goal 4 (Week 1): Patient will demonstrate sustained attention to functional tasks for ~30 minutes with Min A verbal cues for redirection.   Skilled Therapeutic Interventions: Skilled ST services focused on cognitive skills.SLP facilitated functional problem solving with novel card game, blink, played at basic level. Pt required min A verbal cues for problem solving, recall of novel information and monitoring/correcting errors. Pt was left in room with call bell within reach. Recommend to continue skilled St services.      Function:  Eating Eating                 Cognition Comprehension Comprehension assist level: Understands basic 75 - 89% of the time/ requires cueing 10 - 24% of the time  Expression   Expression assist level: Expresses basic 75 - 89% of the time/requires cueing 10 - 24% of the time. Needs helper to occlude trach/needs to repeat words.  Social Interaction Social Interaction assist level: Interacts appropriately 50 - 74% of the time - May be physically or verbally inappropriate.  Problem Solving Problem solving assist level: Solves basic 75 - 89% of the time/requires cueing 10 - 24% of the time  Memory Memory assist level: Recognizes or recalls 50 - 74% of the time/requires cueing 25 - 49% of the time    Pain Pain  Assessment Pain Assessment: No/denies pain  Therapy/Group: Individual Therapy  Eoghan Belcher  [facility] 02/09/2017, 5:55 PM

## 2017-02-09 NOTE — Progress Notes (Signed)
HD tx completed @ 1624 w/o problem, UF goal met, blood rinsed back, VSS, pre tx labs I drew and sent to lab prior to midnight were showing up as "active/in progress" for 02/08/17 @ 0011, I saw this error and called lab and they assured me they were received 02/09/17 @ 0011 and I asked would the results in epic show up for the correct day and time as I have had this problem in the past when I send down labs around midnight w/ them posting for the day before. I was assured they would post for the correct time and day. When I went back to check result they were in fact posted for 02/08/17 @ 0011 instead of 02/09/17 @ 0011 as they should have been, I called lab again to make them aware and they said it is showing up correctly in their software and there is a communication error b/w theirs and epic that needs to be addressed. I asked them to send me a paper copy of results to prove the labs were drawn pre tx and resulted on the correct day and time. Report called to Abigail Miyamoto, RN

## 2017-02-09 NOTE — Progress Notes (Signed)
[facility] Progress Note   Subjective: Lying in bed sleeping. No C/Os. Actually didn't complete HD until 0400 today.   Objective Vitals:   02/09/17 0340 02/09/17 0347 02/09/17 0500 02/09/17 0618  BP: 125/82 126/78 119/73 119/73  Pulse: 82 (!) 107 69   Resp: (!) 21 20 17    Temp:  97.9 F (36.6 C) 98.4 F (36.9 C)   TempSrc:  Oral Oral   SpO2: 99% 99% 96%   Weight:  87.9 kg (193 lb 12.6 oz)    Height:       Physical Exam General: WNWD NAD Heart: S1,S2, RRR Lungs: CTAB A/P Abdomen: Active BS Extremities: No LE Edema Hard cast LLE Dialysis Access: RUA AVF + bruit RIJ TDC Drsg CDI.    Additional Objective Labs: Basic Metabolic Panel: Recent Labs  Lab 02/05/17 2025 02/07/17 0612 02/08/17 0011  NA 132* 130* 130*  K 5.0 4.8 5.2*  CL 96* 95* 92*  CO2 23 23 22   GLUCOSE 112* 101* 97  BUN 57* 49* 70*  CREATININE 10.22* 9.73* 12.80*  CALCIUM 9.6 9.4 9.9  PHOS 6.0* 5.3* 5.4*   Liver Function Tests: Recent Labs  Lab 02/05/17 2025 02/07/17 0612 02/08/17 0011  ALBUMIN 3.5 3.3* 3.6   No results for input(s): LIPASE, AMYLASE in the last 168 hours. CBC: Recent Labs  Lab 02/03/17 1432 02/05/17 2025 02/08/17 0011  WBC 7.9 8.9 9.3  NEUTROABS 5.3  --   --   HGB 9.8* 9.3* 9.3*  HCT 28.3* 27.5* 27.5*  MCV 101.1* 101.9* 101.5*  PLT 310 313 330   Blood Culture    Component Value Date/Time   SDES BLOOD LEFT ARM 01/21/2017 1010   SPECREQUEST  01/21/2017 1010    BOTTLES DRAWN AEROBIC AND ANAEROBIC Blood Culture results may not be optimal due to an excessive volume of blood received in culture bottles   CULT NO GROWTH 5 DAYS 01/21/2017 1010   REPTSTATUS 01/26/2017 FINAL 01/21/2017 1010    Cardiac Enzymes: No results for input(s): CKTOTAL, CKMB, CKMBINDEX, TROPONINI in the last 168 hours. CBG: Recent Labs  Lab 02/08/17 0633 02/08/17 1146 02/08/17 1813 02/08/17 2101 02/09/17 0638  GLUCAP 94 93 94 123* 86   Iron Studies: No results for  input(s): IRON, TIBC, TRANSFERRIN, FERRITIN in the last 72 hours. @lablastinr3 @ Studies/Results: Dg Chest 2 View  Result Date: 02/08/2017 CLINICAL DATA:  Cough, COPD, diabetes mellitus, hypertension, smoker EXAM: CHEST  2 VIEW COMPARISON:  01/20/2017 FINDINGS: RIGHT jugular central venous catheter with tip projecting over RIGHT atrium. Normal heart size, mediastinal contours, and pulmonary vascularity. Atherosclerotic calcification aorta. Lungs clear. No pleural effusion or pneumothorax. Dextroconvex thoracic scoliosis without acute osseous findings. IMPRESSION: No acute abnormalities. Electronically Signed   By: Lavonia Dana M.D.   On: 02/08/2017 17:04   Medications: . sodium chloride    . sodium chloride    . sodium chloride    . sodium chloride     . amLODipine  10 mg Oral Daily  . atorvastatin  20 mg Oral q1800  . calcitRIOL  2 mcg Oral Q M,W,F  . calcium acetate  1,334 mg Oral TID WC  . cinacalcet  120 mg Oral Q M,W,F  . darbepoetin (ARANESP) injection - DIALYSIS  60 mcg Intravenous Q Fri-HD  . folic acid  1 mg Oral Daily  . heparin  5,000 Units Subcutaneous Q8H  . hydrALAZINE  25 mg Oral Q8H  . insulin aspart  0-15 Units Subcutaneous TID AC & HS  .  metoprolol succinate  12.5 mg Oral Q T,Th,S,Su  . pantoprazole  40 mg Oral Daily  . tamsulosin  0.4 mg Oral QPC supper   HD Orders: MWF [facility] 4 hr 15 min 180 NRe 400/800 95.5 kg 2.0 K/2.5 Ca  -Heparin 8600 units IV TIW -Calcitriol 2.0 mcg PO TIW -Sensipar 120 mg PO TIW -Venofer 50 mg IV weekly (Last dose 01/13/17) -Mircera 50 mcg IV q 2 weeks (last dose 12/27/16)   Assessment/Plan: 1. AMS resolved 2. L distal fibula fracture- s/p short leg cast and non-weight bearing PT 3. ESRD continue with HD qMWF Next HD 02/11/16.  4. Anemia: on ESA 5. 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 cont with binders/VDRA/sensipar. Decrease calcitriol to 1.5 mcg PO TIW. Check PTH with HD tomorrow.  6. Nutrition: Albumin 3.6 renal diet add nepro, renal  vit 7. Hypertension/Volume: HD last PM Pre wt 90.9 kg Net UF 3007 Post wt 87.9.BP stable. Lower EDW to 88 kg.  8. COPD per primary 9. DM type 2- per primary  Disposition- currently in CIR cont with PT/OT, not putting much effort into exercises.    Rita H. Brown NP-C 02/09/2017, 10:56 AM  [employer] [phone]  I have seen and examined this patient and agree with plan and assessment in the above note with renal recommendations/intervention highlighted.  Still sleepy.  No new complaints or changes. [place] John A Chigozie Basaldua,MD 02/09/2017 1:43 PM

## 2017-02-09 NOTE — Progress Notes (Signed)
Physical Therapy Session Note  Patient Details  Name: [name] MRN: [id number] Date of Birth: 08/06/1950  Today's Date: 02/09/2017 PT Individual Time: 1000-1055 PT Individual Time Calculation (min): 55 min   Short Term Goals: Week 1:  PT Short Term Goal 1 (Week 1): pt will stand pivot transfer with LRAD, NWBing LLE without physical assistance PT Short Term Goal 2 (Week 1): pt will propel w/c on level tile x 150' with supervision PT Short Term Goal 3 (Week 1): pt will perform gait x 30' with min assist, LRAD, NWBing LLE without physical assistance, with orthosis to hold up LLE PRN  Skilled Therapeutic Interventions/Progress Updates:    no c/o pain but reporting fatigue and cold.  Pt frequently falling asleep throughout session, despite change of location to therapy gym and attempts to increase activity.  Requires constant cues to maintain alert and participate.    Pt transitions supine>EOB with supervision.  Sit<>stand with RW and mod assist, cues for NWB on LLE.  Short distance gait x5' with RW and mod cues for NWB, min guard for safety.  Pt propelled w/c from room to therapy gym with BUEs and significantly increased time (~15 minutes) with constant cues for attention to task and awareness of veering to the R.    PT instructed pt in BLE therex (3# on RLE) 2x15 reps hip flexion, and 2x10-15 reps LAQ.  Pt requires constant cues on each repetition to remain awake, and to complete exercise correctly.  Pt required about 25 minutes to complete two exercises due to falling asleep and requiring constant reminders to attend to task.  Pt reports he can't stay awake because he's cold, but no change in level of arousal with blanket wrapped around shoulders.  Vitals assessed and WNL.  (127/62, 99%, 89 bpm, 98.8 tmax).    Pt returned to room total assist, and performed squat/pivot back to bed on L with max assist and max multimodal cues for sequencing and safety as pt continually leaning posteriorly and  attempting to reach across midline with RUE.  RN aware of pt's lethargy and apparent increase in confusion. Pt left with call bell in reach and needs met.   Therapy Documentation Precautions:  Precautions Precautions: Fall Required Braces or Orthoses: Other Brace/Splint Other Brace/Splint: Short leg cast/CAM boot order Restrictions Weight Bearing Restrictions: Yes LLE Weight Bearing: Non weight bearing   See Function Navigator for Current Functional Status.   Therapy/Group: Individual Therapy   E  02/09/2017, 11:02 AM  

## 2017-02-09 NOTE — Progress Notes (Signed)
Occupational Therapy Session Note  Patient Details  Name: [name] MRN: [id number] Date of Birth: 1951-01-07  Today's Date: 02/09/2017 OT Individual Time: 0800-0900 OT Individual Time Calculation (min): 60 min    Short Term Goals: Week 1:  OT Short Term Goal 1 (Week 1): Pt will perform LB dressing with min A overall.  OT Short Term Goal 2 (Week 1): Pt will perform toilet transfer with use of RW with steady assistance for balance.  OT Short Term Goal 3 (Week 1): Pt will verbalize and demonstrate NWB L LE with min cues for safety with functional transfers.   Skilled Therapeutic Interventions/Progress Updates:    Treatment session focused on ADLs/self care training, transfer training, activity tolerance, pt education on precautions and safety awareness. Upon entering pt sitting EOB and agreeable to AM ADls. Pt completed UB d/b in sitting level with S and set up A. Pt completed EOB>w/c lateral scoot tx with v/c and t/c for NWB to LLE, hand/foot placement, and B knee blocking by therapist with min A. Pt completed LB d/b in w/c at sink with mod A to loop pants. Pt noted to need additional reminders for NWB precautions during standing and transfers. Completed grooming ADLs at sink side in w/c with S. Provided education on NWB Precautions, transfer safety, and energy conservation techniques during ADLs. May benefit from [facility] reacher for LB dressing. Continue towards goals on POC.   Therapy Documentation Precautions:  Precautions Precautions: Fall Required Braces or Orthoses: Other Brace/Splint Other Brace/Splint: Short leg cast/CAM boot order Restrictions Weight Bearing Restrictions: Yes LLE Weight Bearing: Non weight bearing     Pain: Pain Assessment Pain Assessment: Faces Faces Pain Scale: Hurts a little bit Pain Type: Acute pain Pain Location: Ankle Pain Orientation: Left Pain Descriptors / Indicators: Aching Pain Onset: On-going Pain Intervention(s): Repositioned Multiple Pain  Sites: No  See Function Navigator for Current Functional Status.   Therapy/Group: Individual Therapy  Delon Sacramento 02/09/2017, 12:08 PM

## 2017-02-10 ENCOUNTER — Inpatient Hospital Stay (HOSPITAL_COMMUNITY): Payer: Medicare Other | Admitting: Speech Pathology

## 2017-02-10 ENCOUNTER — Inpatient Hospital Stay (HOSPITAL_COMMUNITY): Payer: Medicare Other | Admitting: Occupational Therapy

## 2017-02-10 ENCOUNTER — Inpatient Hospital Stay (HOSPITAL_COMMUNITY): Payer: Medicare Other | Admitting: Physical Therapy

## 2017-02-10 LAB — RENAL FUNCTION PANEL
Albumin: 3.5 g/dL (ref 3.5–5.0)
Anion gap: 11 (ref 5–15)
BUN: 37 mg/dL — AB (ref 6–20)
CALCIUM: 10 mg/dL (ref 8.9–10.3)
CO2: 27 mmol/L (ref 22–32)
CREATININE: 9.86 mg/dL — AB (ref 0.61–1.24)
Chloride: 94 mmol/L — ABNORMAL LOW (ref 101–111)
GFR calc Af Amer: 5 mL/min — ABNORMAL LOW (ref >60)
GFR calc non Af Amer: 5 mL/min — ABNORMAL LOW (ref >60)
GLUCOSE: 123 mg/dL — AB (ref 65–99)
Phosphorus: 4.7 mg/dL — ABNORMAL HIGH (ref 2.5–4.6)
Potassium: 4.7 mmol/L (ref 3.5–5.1)
SODIUM: 132 mmol/L — AB (ref 135–145)

## 2017-02-10 LAB — GLUCOSE, CAPILLARY
GLUCOSE-CAPILLARY: 94 mg/dL (ref 65–99)
GLUCOSE-CAPILLARY: 109 mg/dL — AB (ref 65–99)
GLUCOSE-CAPILLARY: 84 mg/dL (ref 65–99)
Glucose-Capillary: 95 mg/dL (ref 65–99)

## 2017-02-10 LAB — CBC
HCT: 29.8 % — ABNORMAL LOW (ref 39.0–52.0)
Hemoglobin: 9.9 g/dL — ABNORMAL LOW (ref 13.0–17.0)
MCH: 34.4 pg — AB (ref 26.0–34.0)
MCHC: 33.2 g/dL (ref 30.0–36.0)
MCV: 103.5 fL — ABNORMAL HIGH (ref 78.0–100.0)
PLATELETS: 353 10*3/uL (ref 150–400)
RBC: 2.88 MIL/uL — ABNORMAL LOW (ref 4.22–5.81)
RDW: 14.7 % (ref 11.5–15.5)
WBC: 8.2 10*3/uL (ref 4.0–10.5)

## 2017-02-10 MED ORDER — ALTEPLASE 2 MG IJ SOLR: 2.0000 mg | Freq: Once | INTRAMUSCULAR | Status: DC | PRN

## 2017-02-10 MED ORDER — HEPARIN SODIUM (PORCINE) 1000 UNIT/ML DIALYSIS
1000.0000 [IU] | INTRAMUSCULAR | Status: DC | PRN
  Administered 2017-02-10: 1000 [IU] via INTRAVENOUS_CENTRAL

## 2017-02-10 MED ORDER — SODIUM CHLORIDE 0.9 % IV SOLN: 100.0000 mL | INTRAVENOUS | Status: DC | PRN

## 2017-02-10 MED ORDER — HEPARIN SODIUM (PORCINE) 1000 UNIT/ML DIALYSIS: 20.0000 [IU]/kg | INTRAMUSCULAR | Status: DC | PRN

## 2017-02-10 NOTE — Progress Notes (Signed)
HD tx ended 1 hr early at pt's request, system was about to clot and pt said to just take him off, called MD to make him aware, AMA signed, UF goal not met, blood rinsed back VSS, report call ed to Marcello Fennel, RN

## 2017-02-10 NOTE — Progress Notes (Signed)
Physical Therapy Session Note  Patient Details  Name: SURAJ RAMDASS MRN: [id number] Date of Birth: 1951/01/02  Today's Date: 02/10/2017 PT Individual Time: 1415-1500 PT Individual Time Calculation (min): 45 min   Short Term Goals: Week 1:  PT Short Term Goal 1 (Week 1): pt will stand pivot transfer with LRAD, NWBing LLE without physical assistance PT Short Term Goal 2 (Week 1): pt will propel w/c on level tile x 150' with supervision PT Short Term Goal 3 (Week 1): pt will perform gait x 30' with min assist, LRAD, NWBing LLE without physical assistance, with orthosis to hold up LLE PRN  Skilled Therapeutic Interventions/Progress Updates:  Pt presented in bed agreeable to therapy with some encouragement. Pt performed bed mobility with supervision. Performed squat pivot to w/c with pt initially performing lateral scoot closer to w/c. Pt able to maintain LLE off floor during transfer with verbal cues. Pt propelled in hallways approx 290f for endurance with x 3 brief rests due to fatigue. After brief rest pt performed sit to stand in w/c with supervision and compliance of NWBing and ambulated approx 167fback to bed. Pt participated in seated and supine therex to fatigue including LAQ, hip flexion, SLR, and supine hip abd/add. Pt remained in bed at end of session and left with bed alarm on and current needs met.      Therapy Documentation Precautions:  Precautions Precautions: Fall Required Braces or Orthoses: Other Brace/Splint Other Brace/Splint: Short leg cast/CAM boot order Restrictions Weight Bearing Restrictions: Yes LLE Weight Bearing: Non weight bearing General:   Vital Signs: Therapy Vitals Temp: 98.8 F (37.1 C) Temp Source: Oral Pulse Rate: 83 Resp: 18 BP: (!) 132/57 Patient Position (if appropriate): Lying Oxygen Therapy SpO2: 98 % O2 Device: Not Delivered   See Function Navigator for Current Functional Status.   Therapy/Group: Individual Therapy  Yena Tisby   Nayan Proch, PTA  02/10/2017, 3:47 PM

## 2017-02-10 NOTE — Progress Notes (Signed)
02/10/17 Pt experiencing insomnia normally takes like a tylenol pm when at home and experiencing hiccups during day and night. He notes hiccups occur sometimes after he eats, encourage to sit up when eating and afterwards will continue to observe.

## 2017-02-10 NOTE — Progress Notes (Signed)
Speech Language Pathology Daily Session Note  Patient Details  Name: [name] MRN: [id number] Date of Birth: [birth year]  Today's Date: 02/10/2017 SLP Individual Time: 4944-9675 SLP Individual Time Calculation (min): 25 min  Short Term Goals: Week 1: SLP Short Term Goal 1 (Week 1): Patient will demonstrate functional problem solving for basic and famliar tasks with Min A verbal cues.  SLP Short Term Goal 2 (Week 1): Patient will utilize external aids to recall new, daily information with mod A verbal cues.  SLP Short Term Goal 3 (Week 1): Patient will self-monitor and correct errors during functional tasks with Min A verbal cues.  SLP Short Term Goal 4 (Week 1): Patient will demonstrate sustained attention to functional tasks for ~30 minutes with Min A verbal cues for redirection.   Skilled Therapeutic Interventions: Skilled treatment session focused on cognitive goals. Upon arrival, patient was upright in wheelchair but appeared lethargic. SLP facilitated session by providing total A for recall of his current medications, their functions and time of day/day of week for administering them. Patient reported he only knew his medications by their color. Recommend organizing a pill box during next session to maximize safety with medications at home. Patient falling asleep throughout session and was transferred back to bed at end of session. Patient left supine in bed with all needs within reach and alarm on. Continue with current plan of care.      Function:   Cognition Comprehension Comprehension assist level: Understands basic 75 - 89% of the time/ requires cueing 10 - 24% of the time  Expression   Expression assist level: Expresses basic 75 - 89% of the time/requires cueing 10 - 24% of the time. Needs helper to occlude trach/needs to repeat words.  Social Interaction Social Interaction assist level: Interacts appropriately 50 - 74% of the time - May be physically or verbally inappropriate.   Problem Solving Problem solving assist level: Solves basic 75 - 89% of the time/requires cueing 10 - 24% of the time  Memory Memory assist level: Recognizes or recalls 50 - 74% of the time/requires cueing 25 - 49% of the time    Pain No/Denies Pain   Therapy/Group: Individual Therapy  [name] 02/10/2017, 9:56 AM

## 2017-02-10 NOTE — Progress Notes (Signed)
.  PHYSICAL MEDICINE & REHABILITATION     PROGRESS NOTE  Subjective/Complaints:  A little restless last night. Otherwise doing ok. Ready to get going with therapies toeay  ROS: pt denies nausea, vomiting, diarrhea, cough, shortness of breath or chest pain   Objective: Vital Signs: Blood pressure (!) 128/55, pulse 73, temperature 98.1 F (36.7 C), temperature source Oral, resp. rate 19, height 5\' 9"  (1.753 m), weight 87.9 kg (193 lb 12.6 oz), SpO2 99 %. Dg Chest 2 View  Result Date: 02/08/2017 CLINICAL DATA:  Cough, COPD, diabetes mellitus, hypertension, smoker EXAM: CHEST  2 VIEW COMPARISON:  01/20/2017 FINDINGS: RIGHT jugular central venous catheter with tip projecting over RIGHT atrium. Normal heart size, mediastinal contours, and pulmonary vascularity. Atherosclerotic calcification aorta. Lungs clear. No pleural effusion or pneumothorax. Dextroconvex thoracic scoliosis without acute osseous findings. IMPRESSION: No acute abnormalities. Electronically Signed   By: Lavonia Dana M.D.   On: 02/08/2017 17:04   Recent Labs    02/08/17 0011  WBC 9.3  HGB 9.3*  HCT 27.5*  PLT 330   Recent Labs    02/08/17 0011  NA 130*  K 5.2*  CL 92*  GLUCOSE 97  BUN 70*  CREATININE 12.80*  CALCIUM 9.9   CBG (last 3)  Recent Labs    02/09/17 1644 02/09/17 2102 02/10/17 0646  GLUCAP 90 125* 94    Wt Readings from Last 3 Encounters:  02/09/17 87.9 kg (193 lb 12.6 oz)  02/03/17 88.7 kg (195 lb 8.8 oz)  01/19/17 95.7 kg (211 lb)    Physical Exam:  BP (!) 128/55   Pulse 73   Temp 98.1 F (36.7 C) (Oral)   Resp 19   Ht 5\' 9"  (1.753 m)   Wt 87.9 kg (193 lb 12.6 oz)   SpO2 99%   BMI 28.62 kg/m  Constitutional: He appears well-developed. Obese  HENT: Normocephalic and atraumatic.  Eyes: EOM are normal. No discharge.  Cardiovascular: RRR without murmur. No JVD  Respiratory: Effort normal and breath sounds normal.  GI: Soft. Bowel sounds are normal.  Musculoskeletal: No  edema. No tenderness.  Neurological: He is alert and oriented.  Motor: Bilateral upper extremities 5/5 proximal distal Left lower extremity: 5/5 proximal distal (no change) Right lower extremity: 4+-5/5 proximally Skin: Distal LLE with cast Psychiatric: His affect is blunt. He is slowed.    Assessment/Plan: 1. Functional deficits secondary to metabolic encephalopathy which require 3+ hours per day of interdisciplinary therapy in a comprehensive inpatient rehab setting. Physiatrist is providing close team supervision and 24 hour management of active medical problems listed below. Physiatrist and rehab team continue to assess barriers to discharge/monitor patient progress toward functional and medical goals.  Function:  Bathing Bathing position   Position: Sitting EOB  Bathing parts Body parts bathed by patient: Right arm, Left arm, Chest, Abdomen, Front perineal area, Buttocks, Right upper leg, Left upper leg Body parts bathed by helper: Back  Bathing assist Assist Level: Touching or steadying assistance(Pt > 75%)      Upper Body Dressing/Undressing Upper body dressing   What is the patient wearing?: Pull over shirt/dress     Pull over shirt/dress - Perfomed by patient: Thread/unthread right sleeve, Thread/unthread left sleeve, Put head through opening, Pull shirt over trunk          Upper body assist Assist Level: Supervision or verbal cues   Set up : To obtain clothing/put away  Lower Body Dressing/Undressing Lower body dressing   What is the  patient wearing?: Pants, Non-skid slipper socks Underwear - Performed by patient: Thread/unthread right underwear leg, Pull underwear up/down Underwear - Performed by helper: Thread/unthread left underwear leg Pants- Performed by patient: Pull pants up/down Pants- Performed by helper: Thread/unthread left pants leg, Thread/unthread right pants leg Non-skid slipper socks- Performed by patient: Don/doff left sock Non-skid slipper  socks- Performed by helper: Don/doff right sock, Don/doff left sock           AFO - Performed by helper: Don/doff left AFO(AFO=CAM boot)      Lower body assist Assist for lower body dressing: Touching or steadying assistance (Pt > 75%)      Toileting Toileting   Toileting steps completed by patient: Adjust clothing prior to toileting, Performs perineal hygiene, Adjust clothing after toileting   Toileting Assistive Devices: Grab bar or rail  Toileting assist Assist level: Touching or steadying assistance (Pt.75%)   Transfers Chair/bed transfer Chair/bed transfer activity did not occur: Refused Chair/bed transfer method: Squat pivot Chair/bed transfer assist level: Touching or steadying assistance (Pt > 75%) Chair/bed transfer assistive device: Armrests     Locomotion Ambulation Ambulation activity did not occur: Refused   Max distance: 41' Assist level: Touching or steadying assistance (Pt > 75%)   Wheelchair   Type: Manual Max wheelchair distance: 120' Assist Level: Supervision or verbal cues  Cognition Comprehension Comprehension assist level: Understands basic 75 - 89% of the time/ requires cueing 10 - 24% of the time  Expression Expression assist level: Expresses basic 75 - 89% of the time/requires cueing 10 - 24% of the time. Needs helper to occlude trach/needs to repeat words.  Social Interaction Social Interaction assist level: Interacts appropriately 50 - 74% of the time - May be physically or verbally inappropriate.  Problem Solving Problem solving assist level: Solves basic 75 - 89% of the time/requires cueing 10 - 24% of the time  Memory Memory assist level: Recognizes or recalls 50 - 74% of the time/requires cueing 25 - 49% of the time    Medical Problem List and Plan: 1.  Decreased functional mobility secondary to metabolic encephalopathy.   Cont CIR therapies 2.  DVT Prophylaxis/Anticoagulation: Subcutaneous heparin. Monitor for any bleeding episodes 3.  Pain Management: Tylenol as needed 4. Mood: Provide emotional support 5. Neuropsych: This patient is not fully capable of making decisions on his own behalf. 6. Skin/Wound Care: Routine skin checks 7. Fluids/Electrolytes/Nutrition: Routine I&O's 8. End-stage renal disease. Continue hemodialysis as per renal services 9. Hypertension. Toprol 12.5 mg Tuesday Thursday Saturday and Sunday, Norvasc 10 mg daily   Hydralazine 10 TID started on 12/28, increased to 25 on 12/31 with improved control 10. Diabetes mellitus.SSI. Check blood sugars before meals and at bedtime   Relatively controlled on 1/2 11. Closed Left distal fibular fracture/nondisplaced. Nonweightbearing. Patient will need follow-up with orthopedic services. 12. Hyperlipidemia. Lipitor 13. Leukocytosis: Resolved      14 . Anemia of chronic disease   Hemoglobin 9.3 on 12/31   Labs with HD   Continue to monitor 15. Obesity   Body mass index is 28.62 kg/m.   Diet and exercise education   Will cont to encourage weight loss to increase endurance and promote overall health  LOS (Days) 7 A FACE TO FACE EVALUATION WAS PERFORMED  Trayce Caravello T 02/10/2017 8:58 AM   '

## 2017-02-10 NOTE — Progress Notes (Signed)
Occupational Therapy Session Note  Patient Details  Name: [name] MRN: [id number] Date of Birth: 05-03-1950  Today's Date: 02/10/2017 OT Individual Time: 0820-0929 OT Individual Time Calculation (min): 69 min    Short Term Goals: Week 1:  OT Short Term Goal 1 (Week 1): Pt will perform LB dressing with min A overall.  OT Short Term Goal 2 (Week 1): Pt will perform toilet transfer with use of RW with steady assistance for balance.  OT Short Term Goal 3 (Week 1): Pt will verbalize and demonstrate NWB L LE with min cues for safety with functional transfers.   Skilled Therapeutic Interventions/Progress Updates:    Upon entering the room, pt supine in bed and reports , " I'm tired and I didn't sleep well last night. Guess I won't be sleeping now either." Pt required encouragement for participation this session and educated on OT purpose, POC, and goals based on pt's current functional deficits. Pt performed supine >sit to EOB with supervision for bathing tasks. Pt leaning laterally in order to wash buttocks an peri area with steady assistance for balance. Pt verbalized NWB on L UE but standing to pull pants over hips and unable to maintain precaution. Pt is aware and states, " It is only a little weight." OT educated pt on purpose on NWB status with pt verbalizing understanding. Pt performed lateral scoot transfer with steady assistance for balance and therapist setting up wheelchair. Pt seated in wheelchair at sink to brush teeth and shave face with increased time and set up A to obtain needed materials. Pt refusing to leave room with therapist. Pt seated in wheelchair for B UE strengthening with 3 lb hand weights for 3 sets of 15 bicep curls, straight arm raises, and shoulder elevation. Pt with multiple rest breaks secondary to fatigue. Pt seated in wheelchair at end of session with quick release belt donned.   Therapy Documentation Precautions:  Precautions Precautions: Fall Required Braces or  Orthoses: Other Brace/Splint Other Brace/Splint: Short leg cast/CAM boot order Restrictions Weight Bearing Restrictions: Yes LLE Weight Bearing: Non weight bearing General:   Vital Signs: Therapy Vitals BP: (!) 128/55 Other Treatments:    See Function Navigator for Current Functional Status.   Therapy/Group: Individual Therapy    [name] 02/10/2017, 9:50 AM

## 2017-02-10 NOTE — Progress Notes (Signed)
Occupational Therapy Note  Patient Details  Name: [name] MRN: [id number] Date of Birth: Jun 18, 1950  Today's Date: 02/10/2017 OT Missed Time: 63 Minutes Missed Time Reason: Patient fatigue  Missed 45 min due to fatigue- left sleeping in Bed.   Willeen Cass [facility] 02/10/2017, 3:43 PM

## 2017-02-10 NOTE — Progress Notes (Addendum)
Patient ID: [name], male   DOB: 09/02/1950, 67 y.o.   MRN: [id number] S: Reports having hiccups but otherwise doing well. O:BP (!) 132/57 (BP Location: Left Arm)   Pulse 83   Temp 98.8 F (37.1 C) (Oral)   Resp 18   Ht 5\' 9"  (1.753 m)   Wt 87.9 kg (193 lb 12.6 oz)   SpO2 98%   BMI 28.62 kg/m   Intake/Output Summary (Last 24 hours) at 02/10/2017 1635 Last data filed at 02/10/2017 1100 Gross per 24 hour  Intake 240 ml  Output 400 ml  Net -160 ml   Intake/Output: I/O last 3 completed shifts: In: 270 [P.O.:270] Out: 3782 [Urine:775; Other:3007]  Intake/Output this shift:  Total I/O In: 120 [P.O.:120] Out: 200 [Urine:200] Weight change:  Gen:NAD CVS: no rub Resp: cta Abd: benign Ext: RUE AVF +T/B, no edema  Recent Labs  Lab 02/05/17 2025 02/07/17 0612 02/08/17 0011  NA 132* 130* 130*  K 5.0 4.8 5.2*  CL 96* 95* 92*  CO2 23 23 22   GLUCOSE 112* 101* 97  BUN 57* 49* 70*  CREATININE 10.22* 9.73* 12.80*  ALBUMIN 3.5 3.3* 3.6  CALCIUM 9.6 9.4 9.9  PHOS 6.0* 5.3* 5.4*   Liver Function Tests: Recent Labs  Lab 02/05/17 2025 02/07/17 0612 02/08/17 0011  ALBUMIN 3.5 3.3* 3.6   No results for input(s): LIPASE, AMYLASE in the last 168 hours. No results for input(s): AMMONIA in the last 168 hours. CBC: Recent Labs  Lab 02/05/17 2025 02/08/17 0011  WBC 8.9 9.3  HGB 9.3* 9.3*  HCT 27.5* 27.5*  MCV 101.9* 101.5*  PLT 313 330   Cardiac Enzymes: No results for input(s): CKTOTAL, CKMB, CKMBINDEX, TROPONINI in the last 168 hours. CBG: Recent Labs  Lab 02/09/17 1204 02/09/17 1644 02/09/17 2102 02/10/17 0646 02/10/17 1205  GLUCAP 111* 90 125* 94 95    Iron Studies: No results for input(s): IRON, TIBC, TRANSFERRIN, FERRITIN in the last 72 hours. Studies/Results: Dg Chest 2 View  Result Date: 02/08/2017 CLINICAL DATA:  Cough, COPD, diabetes mellitus, hypertension, smoker EXAM: CHEST  2 VIEW COMPARISON:  01/20/2017 FINDINGS: RIGHT jugular central venous  catheter with tip projecting over RIGHT atrium. Normal heart size, mediastinal contours, and pulmonary vascularity. Atherosclerotic calcification aorta. Lungs clear. No pleural effusion or pneumothorax. Dextroconvex thoracic scoliosis without acute osseous findings. IMPRESSION: No acute abnormalities. Electronically Signed   By: Lavonia Dana M.D.   On: 02/08/2017 17:04   . amLODipine  10 mg Oral Daily  . atorvastatin  20 mg Oral q1800  . calcitRIOL  1.5 mcg Oral Q M,W,F  . calcium acetate  1,334 mg Oral TID WC  . cinacalcet  120 mg Oral Q M,W,F  . darbepoetin (ARANESP) injection - DIALYSIS  60 mcg Intravenous Q Fri-HD  . folic acid  1 mg Oral Daily  . heparin  5,000 Units Subcutaneous Q8H  . hydrALAZINE  25 mg Oral Q8H  . insulin aspart  0-15 Units Subcutaneous TID AC & HS  . metoprolol succinate  12.5 mg Oral Q T,Th,S,Su  . pantoprazole  40 mg Oral Daily  . tamsulosin  0.4 mg Oral QPC supper    BMET    Component Value Date/Time   NA 130 (L) 02/08/2017 0011   K 5.2 (H) 02/08/2017 0011   CL 92 (L) 02/08/2017 0011   CO2 22 02/08/2017 0011   GLUCOSE 97 02/08/2017 0011   BUN 70 (H) 02/08/2017 0011   CREATININE 12.80 (H) 02/08/2017 0011  CALCIUM 9.9 02/08/2017 0011   GFRNONAA 3 (L) 02/08/2017 0011   GFRAA 4 (L) 02/08/2017 0011   CBC    Component Value Date/Time   WBC 9.3 02/08/2017 0011   RBC 2.71 (L) 02/08/2017 0011   HGB 9.3 (L) 02/08/2017 0011   HCT 27.5 (L) 02/08/2017 0011   HCT 36.6 (L) 01/21/2017 1013   PLT 330 02/08/2017 0011   MCV 101.5 (H) 02/08/2017 0011   MCH 34.3 (H) 02/08/2017 0011   MCHC 33.8 02/08/2017 0011   RDW 14.3 02/08/2017 0011   LYMPHSABS 1.5 02/03/2017 1432   MONOABS 0.9 02/03/2017 1432   EOSABS 0.2 02/03/2017 1432   BASOSABS 0.0 02/03/2017 1432      HD Orders: MWF [facility] 4 hr 15 min 180 NRe 400/800 95.5 kg (new edw ~ 88kg) 2.0 K/2.5 Ca  -Heparin 8600 units IV TIW -Calcitriol 2.0 mcg PO TIW -Sensipar 120 mg PO TIW -Venofer 50 mg IV weekly (Last  dose 01/13/17) -Mircera 50 mcg IV q 2 weeks (last dose 12/27/16)   Assessment/Plan: 1. AMS resolved 2. L distal fibula fracture- s/p short leg cast and non-weight bearing PT 3. ESRDcontinue with HD qMWF Next HD 02/11/16.  4. 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 cont with binders/VDRA/sensipar. Decrease calcitriol to 1.5 mcg PO TIW. Check PTH with HD tomorrow.  6. Nutrition:Albumin 3.6 renal diet add nepro, renal vit 7. Hypertension/Volume:HD last PM Pre wt 90.9 kg Net UF 3007 Post wt 87.9.BP stable. Lower EDW to 88 kg.  8. COPD per primary 9. DM type 2- per primary 10. Disposition- currently in CIR cont with PT/OT, not putting much effort into exercises.  Donetta Potts, MD [employer] [phone]

## 2017-02-11 ENCOUNTER — Inpatient Hospital Stay (HOSPITAL_COMMUNITY): Payer: Medicare Other

## 2017-02-11 ENCOUNTER — Inpatient Hospital Stay (HOSPITAL_COMMUNITY): Payer: Medicare Other | Admitting: Speech Pathology

## 2017-02-11 ENCOUNTER — Inpatient Hospital Stay (HOSPITAL_COMMUNITY): Payer: Medicare Other | Admitting: Occupational Therapy

## 2017-02-11 LAB — GLUCOSE, CAPILLARY
GLUCOSE-CAPILLARY: 103 mg/dL — AB (ref 65–99)
GLUCOSE-CAPILLARY: 97 mg/dL (ref 65–99)
Glucose-Capillary: 102 mg/dL — ABNORMAL HIGH (ref 65–99)
Glucose-Capillary: 105 mg/dL — ABNORMAL HIGH (ref 65–99)

## 2017-02-11 NOTE — Progress Notes (Signed)
   HD Orders:MWF [facility] 4 hr 15 min 180 NRe 400/800 95.5 kg (new edw ~ 88kg) 2.0 K/2.5 Ca  -Heparin 8600 units IV TIW -Calcitriol 2.0 mcg PO TIW -Sensipar 120 mg PO TIW -Venofer 50 mg IV weekly (Last dose 01/13/17) -Mircera 50 mcg IV q 2 weeks (last dose 12/27/16)   Assessment/Plan: 1. AMS resolved 2. L distal fibula fracture- s/p short leg cast and non-weight bearing PT 3. ESRDcontinue with HD qMWFNext HD 02/13/16. 4. Hypertension/Volume:. Lower EDW at discharge 5. Disposition- currently in CIR cont with PT/OT  Subjective: Interval History: c/o late HD   Objective: Vital signs in last 24 hours: Temp:  [97.1 F (36.2 C)-98.7 F (37.1 C)] 98.4 F (36.9 C) (01/03 1339) Pulse Rate:  [82-104] 85 (01/03 1339) Resp:  [13-20] 18 (01/03 1339) BP: (122-163)/(56-110) 142/62 (01/03 1339) SpO2:  [94 %-98 %] 96 % (01/03 1339) Weight:  [88.5 kg (195 lb 1.7 oz)] 88.5 kg (195 lb 1.7 oz) (01/02 2105) Weight change:   Intake/Output from previous day: 01/02 0701 - 01/03 0700 In: 600 [P.O.:600] Out: 406 [Urine:400] Intake/Output this shift: Total I/O In: 480 [P.O.:480] Out: -   General appearance: alert and cooperative Resp: clear to auscultation bilaterally Cardio: regular rate and rhythm, S1, S2 normal, no murmur, click, rub or gallop GI: soft, non-tender; bowel sounds normal; no masses,  no organomegaly Extremities: LLE splinted  Lab Results: Recent Labs    02/10/17 1751  WBC 8.2  HGB 9.9*  HCT 29.8*  PLT 353   BMET:  Recent Labs    02/10/17 1750  NA 132*  K 4.7  CL 94*  CO2 27  GLUCOSE 123*  BUN 37*  CREATININE 9.86*  CALCIUM 10.0   No results for input(s): PTH in the last 72 hours. Iron Studies: No results for input(s): IRON, TIBC, TRANSFERRIN, FERRITIN in the last 72 hours. Studies/Results: No results found.  Scheduled: . amLODipine  10 mg Oral Daily  . atorvastatin  20 mg Oral q1800  . calcitRIOL  1.5 mcg Oral Q M,W,F  . calcium acetate  1,334  mg Oral TID WC  . cinacalcet  120 mg Oral Q M,W,F  . darbepoetin (ARANESP) injection - DIALYSIS  60 mcg Intravenous Q Fri-HD  . folic acid  1 mg Oral Daily  . heparin  5,000 Units Subcutaneous Q8H  . hydrALAZINE  25 mg Oral Q8H  . insulin aspart  0-15 Units Subcutaneous TID AC & HS  . metoprolol succinate  12.5 mg Oral Q T,Th,S,Su  . pantoprazole  40 mg Oral Daily  . tamsulosin  0.4 mg Oral QPC supper   Continuous:     LOS: 8 days   [name] 02/11/2017,2:15 PM

## 2017-02-11 NOTE — Care Management (Signed)
[facility] Individual Statement of Services  Patient Name:  [name]  Date:  02/05/2018  Welcome to the [facility].  Our goal is to provide you with an individualized program based on your diagnosis and situation, designed to meet your specific needs.  With this comprehensive rehabilitation program, you will be expected to participate in at least 3 hours of rehabilitation therapies Monday-Friday, with modified therapy programming on the weekends.  Your rehabilitation program will include the following services:  Physical Therapy (PT), Occupational Therapy (OT), Speech Therapy (ST), 24 hour per day rehabilitation nursing, Therapeutic Recreaction (TR), Neuropsychology, Case Management (Social Worker), Rehabilitation Medicine, Nutrition Services and Pharmacy Services  Weekly team conferences will be held on Tuesdays to discuss your progress.  Your Social Worker will talk with you frequently to get your input and to update you on team discussions.  Team conferences with you and your family in attendance may also be held.  Expected length of stay: 10-14 days    Overall anticipated outcome: supervision  Depending on your progress and recovery, your program may change. Your Social Worker will coordinate services and will keep you informed of any changes. Your Social Worker's name and contact numbers are listed  below.  The following services may also be recommended but are not provided by the [facility] will be made to provide these services after discharge if needed.  Arrangements include referral to agencies that provide these services.  Your insurance has been verified to be:  Medicare and [employer] Your primary doctor is:  [place]  Pertinent information will be shared with your doctor and your insurance  company.  Social Worker:  [place], [place] or [phone]   Information discussed with and copy given to patient by: Lennart Pall, 02/05/2018, 3:31 PM

## 2017-02-11 NOTE — Progress Notes (Signed)
EOBPhysical Therapy Session Note  Patient Details  Name: [name] MRN: [id number] Date of Birth: Oct 13, 1950  Today's Date: 02/11/2017 PT Individual Time:Session1: 1000-1108; [id number]:[id number] PT Individual Time Calculation (min): 68 min, 30 min  Short Term Goals: Week 1:  PT Short Term Goal 1 (Week 1): pt will stand pivot transfer with LRAD, NWBing LLE without physical assistance PT Short Term Goal 2 (Week 1): pt will propel w/c on level tile x 150' with supervision PT Short Term Goal 3 (Week 1): pt will perform gait x 30' with min assist, LRAD, NWBing LLE without physical assistance, with orthosis to hold up LLE PRN  Skilled Therapeutic Interventions/Progress Updates:   Session1:  Patient in w/c s/p speech therapy in room.  Propelled to therapy gym with S and cues for directions.  Patient sit to stand NWB L LE with min A and ambulated with RW 37' with min to mod A cues for proximity to walker and increased step length.  Patient standing at hi/lo table for 10 min with one seated rest for checkers game.  Seated therex 3 x 5-8 reps of L hip flexion and knee extension with 2.5# weight.  Sit to stand to walker min A cues for weight bearing.  Ambulated 17' with RW and min to mod A one episode of L foot hitting floor due to LOB with shorter steps with fatigue and increased distance from walker.  Patient sit to supine with S and performed bridging on ball, lateral trunk rotation and lower trunk flexion.  Supine hip abduction on each leg, L LE heel slides and R LE SLR's with focus on knee control for endurance and single leg bridge with 3 sec hold. Supine to sit S.  Demonstrated technique for negotiating one step in reverse with RW and pt performed with min/mod A.  Reported needed to go to bathroom so pt propelled to room, assisted into bathroom and sit to stand with grabbar and min  A.  Standing to urinate with min-guard A and cues to maintain NWB L LE.  Patient in w/c assisted to bedside and RN in to  deliver medications.  Left with RN in room and call bell in reach with L LE elevated on 2 pillows in w/c per pt request.   Session2:Patient in supine time to encourage participation.  Patient supine to sit with S and transferred to w/c attempted squat pivot, but pt putting L foot on floor despite cues and min to mod A.  Patient propelled to gym with S.  Requested to attempt steps with crutches.  Performed 2 steps with min A but unable to maintain NWB L LE with max cues.  Assist to safely sit in w/c.  Pt c/o L hand middle finger pain and swelling and noted some catching with painful nodule in palm.  Returned to room with staff assist in w/c and stand pivot with RW min A and left EOB with heat pack on hand.  RN aware alarm not set per pt preference.   Therapy Documentation Precautions:  Precautions Precautions: Fall Required Braces or Orthoses: Other Brace/Splint Other Brace/Splint: Short leg cast/CAM boot order Restrictions Weight Bearing Restrictions: Yes LLE Weight Bearing: Non weight bearing General:   Vital Signs: Therapy Vitals Pulse Rate: 93 Resp: 20 BP: (!) 155/73 Patient Position (if appropriate): Sitting Oxygen Therapy SpO2: 97 % O2 Device: Not Delivered Pain: Pain Assessment Pain Assessment: No/denies pain Pain Score: 5  Pain Type: Acute pain Pain Location: Ankle Pain Orientation: Left Pain Descriptors /  Indicators: Aching Pain Intervention(s): Repositioned;Ambulation/increased activity   See Function Navigator for Current Functional Status.   Therapy/Group: Individual Therapy  [name] 02/11/2017, 11:39 AM

## 2017-02-11 NOTE — Progress Notes (Signed)
Occupational Therapy Session Note  Patient Details  Name: [name] MRN: [id number] Date of Birth: 05/06/1950  Today's Date: 02/11/2017 OT Individual Time: [phone] and [id number] ( make up session) OT Individual Time Calculation (min): 73 min and 40 mins (make up)   Short Term Goals: Week 1:  OT Short Term Goal 1 (Week 1): Pt will perform LB dressing with min A overall.  OT Short Term Goal 2 (Week 1): Pt will perform toilet transfer with use of RW with steady assistance for balance.  OT Short Term Goal 3 (Week 1): Pt will verbalize and demonstrate NWB L LE with min cues for safety with functional transfers.    Skilled Therapeutic Interventions/Progress Updates:    Session 1: Upon entering the room, pt seated on EOB and RN present with medications. Pt agreeable to OT intervention and requesting to wash on EOB. Pt performing tasks with lateral leans to wash buttocks and peri area. Pt required assistance to thread clothing and socks over L UE secondary to cast. Pt donning hospital gown as well with set up this session. Pt transferred into wheelchair with steady assistance for lateral scoot. OT assisted pt to ADL apartment. Pt ambulating on carpeted surface with RW, while maintaining precautions, onto standard height bed with overall steady assistance for balance. Pt also navigating RW in small space with side steps. Pt returning to wheelchair and returning to room at end of session with call bell and all needed items within reach upon exiting the room.   Session 2: Upon entering the room, pt seated in wheelchair awaiting therapist. Pt agreeable to OT intervention but declined exiting the room. Pt engaged in B UE strengthening exercises with 4 lb resistive dowel rod. OT demonstrating exercise and pt returning demonstration with min verbal cues. Pt performed 3 sets of 10 forward rows, backwards rows, straight arm raises, chest presses, and bicep curls. Pt required encouragement throughout and remained  seated in wheelchair at end of session.   Therapy Documentation Precautions:  Precautions Precautions: Fall Required Braces or Orthoses: Other Brace/Splint Other Brace/Splint: Short leg cast/CAM boot order Restrictions Weight Bearing Restrictions: Yes LLE Weight Bearing: Non weight bearing General:   Vital Signs: Therapy Vitals Pulse Rate: 93 BP: (!) 155/73 Pain: Pain Assessment Pain Score: 5  Pain Type: Acute pain Pain Location: Ankle Pain Orientation: Left Pain Descriptors / Indicators: Aching Pain Intervention(s): Repositioned;Ambulation/increased activity ADL:   Vision   Perception    Praxis   Exercises:   Other Treatments:    See Function Navigator for Current Functional Status.   Therapy/Group: Individual Therapy  [name] 02/11/2017, 12:47 PM

## 2017-02-11 NOTE — Progress Notes (Signed)
Speech Language Pathology Weekly Progress and Session Note  Patient Details  Name: [name] MRN: [id number] Date of Birth: 28-Mar-1950  Beginning of progress report period: February 03, 2017 End of progress report period: February 11, 2017  Today's Date: 02/11/2017 SLP Individual Time: 0930-1000 SLP Individual Time Calculation (min): 30 min  Short Term Goals: Week 1: SLP Short Term Goal 1 (Week 1): Patient will demonstrate functional problem solving for basic and famliar tasks with Min A verbal cues.  SLP Short Term Goal 1 - Progress (Week 1): Met SLP Short Term Goal 2 (Week 1): Patient will utilize external aids to recall new, daily information with mod A verbal cues.  SLP Short Term Goal 2 - Progress (Week 1): Met SLP Short Term Goal 3 (Week 1): Patient will self-monitor and correct errors during functional tasks with Min A verbal cues.  SLP Short Term Goal 3 - Progress (Week 1): Met SLP Short Term Goal 4 (Week 1): Patient will demonstrate sustained attention to functional tasks for ~30 minutes with Min A verbal cues for redirection.  SLP Short Term Goal 4 - Progress (Week 1): Met    New Short Term Goals: Week 2: SLP Short Term Goal 1 (Week 2): Patient will demonstrate functional problem solving for mildly complex tasks with Mod A verbal cues.  SLP Short Term Goal 2 (Week 2): Patient will utilize external aids to recall new, daily information with min A verbal cues.  SLP Short Term Goal 3 (Week 2): Patient will self-monitor and correct errors during functional tasks with supervision verbal cues.  SLP Short Term Goal 4 (Week 2): Patient will demonstrate selective attention to functional task in a mildly distracting enviornment for ~45 minutes with Min A verbal cues for redirection.   Weekly Progress Updates: Patient has made functional but inconsistent gains and has met 4 of 4 STG's this reporting period. Currently, patient requires overall Min-Mod A verbal cues for problem solving,  recall, awareness and attention in order to complete functional and familiar tasks safely. Patient education is ongoing. Patient would benefit from continued skilled SLP intervention to maximize his cognitive function and overall functional independence prior to discharge.      Intensity: Minumum of 1-2 x/day, 30 to 90 minutes Frequency: 3 to 5 out of 7 days Duration/Length of Stay: 02/16/17 Treatment/Interventions: Cognitive remediation/compensation;Internal/external aids;Therapeutic Activities;Environmental controls;Cueing hierarchy;Functional tasks;Patient/family education   Daily Session  Skilled Therapeutic Interventions: Skilled treatment session focused on cognitive goals. SLP facilitated session by providing Max A verbal cues for problem solving during a mildly complex, new learning task of organizing a 4 time per day pill box. However, patient appeared brighter, more engaged and more attentive this session. Patient handed off to PT. Continue with current plan of care.      Function:   Cognition Comprehension Comprehension assist level: Understands basic 75 - 89% of the time/ requires cueing 10 - 24% of the time  Expression   Expression assist level: Expresses basic 75 - 89% of the time/requires cueing 10 - 24% of the time. Needs helper to occlude trach/needs to repeat words.  Social Interaction Social Interaction assist level: Interacts appropriately 50 - 74% of the time - May be physically or verbally inappropriate.  Problem Solving Problem solving assist level: Solves basic 75 - 89% of the time/requires cueing 10 - 24% of the time  Memory Memory assist level: Recognizes or recalls 50 - 74% of the time/requires cueing 25 - 49% of the time   Pain No/Denies  Pain   Therapy/Group: Individual Therapy  [name] 02/11/2017, 3:53 PM

## 2017-02-11 NOTE — Progress Notes (Signed)
. [name] PHYSICAL MEDICINE & REHABILITATION     PROGRESS NOTE  Subjective/Complaints:  Had a reasonable night.   ROS: pt denies nausea, vomiting, diarrhea, cough, shortness of breath or chest pain    Objective: Vital Signs: Blood pressure (!) 152/69, pulse 94, temperature 98.7 F (37.1 C), temperature source Oral, resp. rate 20, height 5\' 9"  (1.753 m), weight 88.5 kg (195 lb 1.7 oz), SpO2 97 %. No results found. Recent Labs    02/10/17 1751  WBC 8.2  HGB 9.9*  HCT 29.8*  PLT 353   Recent Labs    02/10/17 1750  NA 132*  K 4.7  CL 94*  GLUCOSE 123*  BUN 37*  CREATININE 9.86*  CALCIUM 10.0   CBG (last 3)  Recent Labs    02/10/17 1635 02/10/17 2207 02/11/17 0656  GLUCAP 109* 84 102*    Wt Readings from Last 3 Encounters:  02/10/17 88.5 kg (195 lb 1.7 oz)  02/03/17 88.7 kg (195 lb 8.8 oz)  01/19/17 95.7 kg (211 lb)    Physical Exam:  BP (!) 152/69 (BP Location: Left Arm)   Pulse 94   Temp 98.7 F (37.1 C) (Oral)   Resp 20   Ht 5\' 9"  (1.753 m)   Wt 88.5 kg (195 lb 1.7 oz)   SpO2 97%   BMI 28.81 kg/m  Constitutional: He appears well-developed. Obese  HENT: Normocephalic and atraumatic.  Eyes: EOM are normal. No discharge.  Cardiovascular: RRR without murmur. No JVD  Respiratory: CTA Bilaterally without wheezes or rales. Normal effort .  GI: Soft. Bowel sounds are normal.  Musculoskeletal: No edema. No tenderness.  Neurological: He is alert and oriented.  Motor: Bilateral upper extremities 5/5 proximal distal Left lower extremity: 5/5 proximal distal (no change) Right lower extremity: 4+-5/5 proximally Skin: Distal LLE with cast Psychiatric: His affect is blunt. He is slowed.    Assessment/Plan: 1. Functional deficits secondary to metabolic encephalopathy which require 3+ hours per day of interdisciplinary therapy in a comprehensive inpatient rehab setting. Physiatrist is providing close team supervision and 24 hour management of active medical  problems listed below. Physiatrist and rehab team continue to assess barriers to discharge/monitor patient progress toward functional and medical goals.  Function:  Bathing Bathing position   Position: Sitting EOB  Bathing parts Body parts bathed by patient: Right arm, Left arm, Chest, Abdomen, Front perineal area, Buttocks, Right upper leg, Left upper leg, Right lower leg Body parts bathed by helper: Back  Bathing assist Assist Level: Touching or steadying assistance(Pt > 75%)      Upper Body Dressing/Undressing Upper body dressing   What is the patient wearing?: Pull over shirt/dress     Pull over shirt/dress - Perfomed by patient: Thread/unthread right sleeve, Thread/unthread left sleeve, Put head through opening, Pull shirt over trunk          Upper body assist Assist Level: Supervision or verbal cues, Set up   Set up : To obtain clothing/put away  Lower Body Dressing/Undressing Lower body dressing   What is the patient wearing?: Pants Underwear - Performed by patient: Thread/unthread right underwear leg, Pull underwear up/down Underwear - Performed by helper: Thread/unthread left underwear leg Pants- Performed by patient: Pull pants up/down, Thread/unthread right pants leg Pants- Performed by helper: Thread/unthread left pants leg Non-skid slipper socks- Performed by patient: Don/doff left sock Non-skid slipper socks- Performed by helper: Don/doff right sock, Don/doff left sock           AFO -  Performed by helper: Don/doff left AFO(AFO=CAM boot)      Lower body assist Assist for lower body dressing: Touching or steadying assistance (Pt > 75%)      Toileting Toileting   Toileting steps completed by patient: Adjust clothing prior to toileting, Performs perineal hygiene, Adjust clothing after toileting   Toileting Assistive Devices: Grab bar or rail  Toileting assist Assist level: Touching or steadying assistance (Pt.75%)   Transfers Chair/bed transfer  Chair/bed transfer activity did not occur: Refused Chair/bed transfer method: Squat pivot Chair/bed transfer assist level: Supervision or verbal cues Chair/bed transfer assistive device: Armrests     Locomotion Ambulation Ambulation activity did not occur: Refused   Max distance: 49ft Assist level: Touching or steadying assistance (Pt > 75%)   Wheelchair   Type: Manual Max wheelchair distance: 120' Assist Level: Supervision or verbal cues  Cognition Comprehension Comprehension assist level: Understands basic 75 - 89% of the time/ requires cueing 10 - 24% of the time  Expression Expression assist level: Expresses basic 75 - 89% of the time/requires cueing 10 - 24% of the time. Needs helper to occlude trach/needs to repeat words.  Social Interaction Social Interaction assist level: Interacts appropriately 50 - 74% of the time - May be physically or verbally inappropriate.  Problem Solving Problem solving assist level: Solves basic 75 - 89% of the time/requires cueing 10 - 24% of the time  Memory Memory assist level: Recognizes or recalls 50 - 74% of the time/requires cueing 25 - 49% of the time    Medical Problem List and Plan: 1.  Decreased functional mobility secondary to metabolic encephalopathy.   Cont CIR therapies 2.  DVT Prophylaxis/Anticoagulation: Subcutaneous heparin. Monitor for any bleeding episodes 3. Pain Management: Tylenol as needed 4. Mood: Provide emotional support 5. Neuropsych: This patient is not fully capable of making decisions on his own behalf. 6. Skin/Wound Care: Routine skin checks 7. Fluids/Electrolytes/Nutrition: Routine I&O's 8. End-stage renal disease. Continue hemodialysis as per renal services 9. Hypertension. Toprol 12.5 mg Tuesday Thursday Saturday and Sunday, Norvasc 10 mg daily   Hydralazine 10 TID started on 12/28, increased to 25 on 12/31 with improved control 10. Diabetes mellitus.SSI. Check blood sugars before meals and at bedtime    Relatively controlled on 1/2 11. Closed Left distal fibular fracture/nondisplaced. Nonweightbearing. Patient will need follow-up with orthopedic services. 12. Hyperlipidemia. Lipitor 13. Leukocytosis: Resolved      14 . Anemia of chronic disease   Hemoglobin 9.3 on 12/31   Labs with HD   Continue to monitor 15. Obesity   Body mass index is 28.81 kg/m.   Diet and exercise education   Will cont to encourage weight loss to increase endurance and promote overall health  LOS (Days) 8 A FACE TO FACE EVALUATION WAS PERFORMED  [name] T 02/11/2017 8:54 AM   '

## 2017-02-12 ENCOUNTER — Inpatient Hospital Stay (HOSPITAL_COMMUNITY): Payer: Medicare Other

## 2017-02-12 ENCOUNTER — Inpatient Hospital Stay (HOSPITAL_COMMUNITY): Payer: Medicare Other | Admitting: Speech Pathology

## 2017-02-12 ENCOUNTER — Inpatient Hospital Stay (HOSPITAL_COMMUNITY): Payer: Medicare Other | Admitting: Occupational Therapy

## 2017-02-12 LAB — RENAL FUNCTION PANEL
Albumin: 3.6 g/dL (ref 3.5–5.0)
Anion gap: 10 (ref 5–15)
BUN: 30 mg/dL — AB (ref 6–20)
CHLORIDE: 94 mmol/L — AB (ref 101–111)
CO2: 27 mmol/L (ref 22–32)
CREATININE: 9.38 mg/dL — AB (ref 0.61–1.24)
Calcium: 10.4 mg/dL — ABNORMAL HIGH (ref 8.9–10.3)
GFR calc Af Amer: 6 mL/min — ABNORMAL LOW (ref >60)
GFR, EST NON AFRICAN AMERICAN: 5 mL/min — AB (ref >60)
GLUCOSE: 113 mg/dL — AB (ref 65–99)
Phosphorus: 4.4 mg/dL (ref 2.5–4.6)
Potassium: 4.3 mmol/L (ref 3.5–5.1)
Sodium: 131 mmol/L — ABNORMAL LOW (ref 135–145)

## 2017-02-12 LAB — CBC
HEMATOCRIT: 29.4 % — AB (ref 39.0–52.0)
Hemoglobin: 9.5 g/dL — ABNORMAL LOW (ref 13.0–17.0)
MCH: 33.9 pg (ref 26.0–34.0)
MCHC: 32.3 g/dL (ref 30.0–36.0)
MCV: 105 fL — AB (ref 78.0–100.0)
PLATELETS: 296 10*3/uL (ref 150–400)
RBC: 2.8 MIL/uL — ABNORMAL LOW (ref 4.22–5.81)
RDW: 14.6 % (ref 11.5–15.5)
WBC: 7.5 10*3/uL (ref 4.0–10.5)

## 2017-02-12 LAB — GLUCOSE, CAPILLARY
GLUCOSE-CAPILLARY: 102 mg/dL — AB (ref 65–99)
GLUCOSE-CAPILLARY: 99 mg/dL (ref 65–99)
GLUCOSE-CAPILLARY: 96 mg/dL (ref 65–99)
Glucose-Capillary: 97 mg/dL (ref 65–99)

## 2017-02-12 MED ORDER — HEPARIN SODIUM (PORCINE) 1000 UNIT/ML DIALYSIS: 1000.0000 [IU] | INTRAMUSCULAR | Status: DC | PRN

## 2017-02-12 MED ORDER — LIDOCAINE-PRILOCAINE 2.5-2.5 % EX CREA
1.0000 | TOPICAL_CREAM | CUTANEOUS | Status: DC | PRN
Start: 2017-02-12 — End: 2017-02-12

## 2017-02-12 MED ORDER — LIDOCAINE HCL (PF) 1 % IJ SOLN
5.0000 mL | INTRAMUSCULAR | Status: DC | PRN
Start: 2017-02-12 — End: 2017-02-12

## 2017-02-12 MED ORDER — CALCITRIOL 0.5 MCG PO CAPS
1.0000 ug | ORAL_CAPSULE | ORAL | Status: DC
  Administered 2017-02-12: 1 ug via ORAL

## 2017-02-12 MED ORDER — CALCITRIOL 0.5 MCG PO CAPS
ORAL_CAPSULE | ORAL | Status: AC
  Administered 2017-02-12: 1 ug via ORAL
  Filled 2017-02-12: qty 2

## 2017-02-12 MED ORDER — SODIUM CHLORIDE 0.9 % IV SOLN: 100.0000 mL | INTRAVENOUS | Status: DC | PRN

## 2017-02-12 MED ORDER — HEPARIN SODIUM (PORCINE) 1000 UNIT/ML DIALYSIS: 20.0000 [IU]/kg | INTRAMUSCULAR | Status: DC | PRN

## 2017-02-12 MED ORDER — DARBEPOETIN ALFA 60 MCG/0.3ML IJ SOSY
PREFILLED_SYRINGE | INTRAMUSCULAR | Status: AC
  Administered 2017-02-12: 60 ug via INTRAVENOUS
  Filled 2017-02-12: qty 0.3

## 2017-02-12 MED ORDER — PENTAFLUOROPROP-TETRAFLUOROETH EX AERO
1.0000 | INHALATION_SPRAY | CUTANEOUS | Status: DC | PRN
Start: 2017-02-12 — End: 2017-02-12

## 2017-02-12 MED ORDER — ALTEPLASE 2 MG IJ SOLR: 2.0000 mg | Freq: Once | INTRAMUSCULAR | Status: DC | PRN

## 2017-02-12 NOTE — Patient Care Conference (Signed)
Inpatient RehabilitationTeam Conference and Plan of Care Update Date: 02/12/2017   Time: 2:18 PM    Patient Name: [name]      Medical Record Number: [id number]  Date of Birth: 11-21-1950 Sex: Male         Room/Bed: 4W11C/4W11C-01 Payor Info: Payor: MEDICARE / Plan: MEDICARE PART A AND B / Product Type: *No Product type* /    Admitting Diagnosis: metabolic, Enceph  Admit Date/Time:  02/03/2017  5:16 PM Admission Comments: No comment available   Primary Diagnosis:  <principal problem not specified> Principal Problem: <principal problem not specified>  Patient Active Problem List   Diagnosis Date Noted  . ESRD on dialysis ([facility])   . Anemia of chronic disease   . Diabetes mellitus type 2 in obese ([place] [place])   . Metabolic encephalopathy 88/41/6606  . Leukocytosis   . Hyperlipidemia   . Benign essential HTN   . Chronic obstructive pulmonary disease ([facility])   . Closed fracture of right distal fibula   . Abnormal swallowing   . Altered mental status   . SIRS (systemic inflammatory response syndrome) ([facility]) 01/21/2017  . Hypertensive urgency 01/21/2017  . Closed fracture of distal end of left fibula 01/21/2017  . Anemia due to chronic kidney disease 01/21/2017  . Acute metabolic encephalopathy 30/16/0109  . Hyperkalemia 01/20/2017  . Acute respiratory distress   . End stage renal disease on dialysis ([place])   . Influenza 02/28/2016  . Acute respiratory failure with hypoxia ([facility]) 03/14/2015  . Acute renal failure superimposed on stage 4 chronic kidney disease ([place]) 03/13/2015  . Acute renal failure (ARF) ([place]) 03/09/2015  . COPD exacerbation ([place])   . Diabetes mellitus ([place]) 03/08/2015  . Anemia 03/08/2015  . Acute on chronic renal failure ([place]) 03/08/2015  . Urinary retention 03/08/2015  . Kidney lesion, native, left 03/08/2015  . Pericardial effusion 03/08/2015  . COPD IV / still smoking  03/08/2015  . Cigarette smoker 03/08/2015  . CHRONIC KIDNEY DISEASE STAGE III (MODERATE)  11/06/2009  . ELEVATED PROSTATE SPECIFIC ANTIGEN 11/04/2009  . HYPERLIPIDEMIA 10/10/2009  . ERECTILE DYSFUNCTION 10/10/2009  . Essential hypertension 10/10/2009  . PREDIABETES 10/10/2009  . COLONIC POLYPS, HX OF 10/10/2009    Expected Discharge Date: Expected Discharge Date: 02/16/17  Team Members Present: Physician leading conference: Dr. Alger Simons Social Worker Present: Lennart Pall, LCSW Nurse Present: Blair Heys, RN PT Present: Canary Brim, PT;Other (comment)(Cindy Middlebush, PT) SLP Present: Weston Anna, SLP PPS Coordinator present : Daiva Nakayama, RN, CRRN     Current Status/Progress Goal Weekly Team Focus  Medical   Improving cognition.  Pain is generally more tolerable.  Wearing cast for fibular fracture left lower extremity  Improve activity tolerance   Blood pressure control, pain control, normalization of sleep patterns   Bowel/Bladder   Continent of B/B  Maintatin continence  Assist pt with toileting as needed   Swallow/Nutrition/ Hydration             ADL's   Min A bathing, Supervision UB dressing, Mod A LB dressing, Min A stand pivot + squat pivot transfers. Min A toileting.   Supervision overall   Safety awareness, balance, precaution adherence during functional tasks, d/c planning, family education   Mobility   min assist overall with RW or squat pivot transfers, up to mod assist pending adherence to WB status; still very poor adherence to NWB and limited participation  supervision overall w/c level and short distance household gait  adherence to NWB status, cognition, sit <>  stands, functional standing balance, endurance, d/c planning, gait, stregnthening   Communication             Safety/Cognition/ Behavioral Observations  Mod-Max A  Supervision  attention, problem solving, awareness, recall    Pain   C/o left ankle pain 6/10  Pain less than 2  Assess qshift and PRN   Skin   Skin intact and free of signs of infection  Maintain skin integrity and prevent  infection  Assess qshift and PRN    Rehab Goals Patient on target to meet rehab goals: Yes *See Care Plan and progress notes for long and short-term goals.     Barriers to Discharge  Current Status/Progress Possible Resolutions Date Resolved   Physician    Medical stability        Continue therapeutic intervention, adherence to orthopedic precautions      Nursing                  PT                    OT                  SLP                SW                Discharge Planning/Teaching Needs:  Pt to return to his home and daughter, Beckie Busing, will stay and provide 24/7 supervision  Will contact daughter to schedule family ed.   Team Discussion:  No new medical issues.  ST addressing functional activities/ cognition.  Min assist overall with PT/ OT and on track for supervision goals.  Ready to begin family ed.  Revisions to Treatment Plan:  None    Continued Need for Acute Rehabilitation Level of Care: The patient requires daily medical management by a physician with specialized training in physical medicine and rehabilitation for the following conditions: Daily direction of a multidisciplinary physical rehabilitation program to ensure safe treatment while eliciting the highest outcome that is of practical value to the patient.: Yes Daily medical management of patient stability for increased activity during participation in an intensive rehabilitation regime.: Yes Daily analysis of laboratory values and/or radiology reports with any subsequent need for medication adjustment of medical intervention for : Neurological problems;Post surgical problems  Shanzay Hepworth 02/12/2017, 2:18 PM

## 2017-02-12 NOTE — Progress Notes (Signed)
Social Work  Patient ID: [name], male   DOB: May 03, 1950, 67 y.o.   MRN: [id number]  Reviewed team conference with both patient and daughter, [name], and they are aware that we continue to plan toward DC on Tuesday.  Have scheduled for daughter to be here Monday afternoon beginning at 1:00 for family education.  Continue to follow.  Wania Longstreth, LCSW

## 2017-02-12 NOTE — Progress Notes (Signed)
Physical Therapy Weekly Progress Note  Patient Details  Name: [name] MRN: [id number] Date of Birth: 12-08-1950  Beginning of progress report period: February 04, 2017 End of progress report period: February 12, 2017  Today's Date: 02/12/2017 PT Individual Time: 7681-1572 PT Individual Time Calculation (min): 84 min   Patient has met 2 of 3 short term goals.  Patient progressing with LE strength and balance and able to keep NWB approx 80% of the time with mobility tasks, but still plants L foot at times, especially on stairs.  Patient able to stand pivot with walker keeping L LE NWB, but at times still needs physical help depending on safety and due to L middle finger pain with trigger finger.   Patient continues to demonstrate the following deficits muscle weakness, NWB L LE and decreased endurance and therefore will continue to benefit from skilled PT intervention to increase functional independence with mobility.  Patient progressing toward long term goals..  Plan of care revisions: added stair goal..  PT Short Term Goals Week 1:  PT Short Term Goal 1 (Week 1): pt will stand pivot transfer with LRAD, NWBing LLE without physical assistance PT Short Term Goal 1 - Progress (Week 1): Partly met PT Short Term Goal 2 (Week 1): pt will propel w/c on level tile x 150' with supervision PT Short Term Goal 2 - Progress (Week 1): Met PT Short Term Goal 3 (Week 1): pt will perform gait x 30' with min assist, LRAD, NWBing LLE without physical assistance, with orthosis to hold up LLE PRN PT Short Term Goal 3 - Progress (Week 1): Met Week 2:  PT Short Term Goal 1 (Week 2): STG=LTG due to LOS  Skilled Therapeutic Interventions/Progress Updates:    Patient in w/c in room reporting pain in L middle finger too much to attempt much today especially w/c mobility.  Pt assisted in w/c to therapy gym.  Sit to stand with RW (adapted with cloth to cushion L hand grip) with minguard A and pt ambulated 40' with  two turns with min A and mod cues for keeping walker still when taking forward hop.  Demonstrated reverse technique to negotiate 2 steps with RW and pt performed with min A and max cues, occasional A to keep L LE NWB.  Seated in w/c for LE therex 2 x 10 reps hip flexion, LAQ with 2.5# weight on ankles, hip abduction with yellow t-band around knees . Patient performed car transfer with min A and noted difficulty getting L foot in car, but pt reports daughter can assist.  Patient transferred to mat min A with RW and measured for wheelchair for home and SW made aware.  Patient requesting to toilet so assisted to room in w/c and stand step to void in standing with RW and mod cues minguard A.  Patient standing for [place] game in room with minguard A with one hand supported x 2 x 2-3 minutes.  Patient left up in w/c per his request with all needs in reach.   Therapy Documentation Precautions:  Precautions Precautions: Fall Required Braces or Orthoses: Other Brace/Splint Other Brace/Splint: Short leg cast/CAM boot order Restrictions Weight Bearing Restrictions: Yes LLE Weight Bearing: Non weight bearing Pain: Pain Assessment Pain Assessment: 0-10 Pain Score: 3  Faces Pain Scale: Hurts even more Pain Type: Acute pain Pain Location: Finger (Comment which one)(L middle finger) Pain Orientation: Left Pain Descriptors / Indicators: Aching Pain Intervention(s): RN made aware;Heat applied   See Function Navigator for  Current Functional Status.  Therapy/Group: Individual Therapy  [name] 02/12/2017, 2:32 PM

## 2017-02-12 NOTE — Progress Notes (Signed)
  [name]   Subjective:  Seen in room. No c/o today. No CP/dyspnea.  Objective Vitals:   02/11/17 1339 02/11/17 1450 02/11/17 2135 02/12/17 0619  BP: (!) 142/62 (!) 129/52 (!) 152/68 134/62  Pulse: 85 85    Resp: 18     Temp: 98.4 F (36.9 C)     TempSrc: Oral     SpO2: 96% 99%    Weight:      Height:       Physical Exam General: Well appearing man, NAD. Heart: RRR; no murmur Lungs: CTAB Extremities: No LE edema, L leg in cast Dialysis Access: R AVF + thrill  Additional Objective Labs: Basic Metabolic Panel: Recent Labs  Lab 02/07/17 0612 02/08/17 0011 02/10/17 1750  NA 130* 130* 132*  K 4.8 5.2* 4.7  CL 95* 92* 94*  CO2 23 22 27   GLUCOSE 101* 97 123*  BUN 49* 70* 37*  CREATININE 9.73* 12.80* 9.86*  CALCIUM 9.4 9.9 10.0  PHOS 5.3* 5.4* 4.7*   Liver Function Tests: Recent Labs  Lab 02/07/17 0612 02/08/17 0011 02/10/17 1750  ALBUMIN 3.3* 3.6 3.5   CBC: Recent Labs  Lab 02/05/17 2025 02/08/17 0011 02/10/17 1751  WBC 8.9 9.3 8.2  HGB 9.3* 9.3* 9.9*  HCT 27.5* 27.5* 29.8*  MCV 101.9* 101.5* 103.5*  PLT 313 330 353   Blood Culture    Component Value Date/Time   SDES URINE, CLEAN CATCH 02/08/2017 1311   SPECREQUEST NONE 02/08/2017 1311   CULT <10,000 COLONIES/mL INSIGNIFICANT GROWTH (A) 02/08/2017 1311   REPTSTATUS 02/09/2017 FINAL 02/08/2017 1311   Medications:  . amLODipine  10 mg Oral Daily  . atorvastatin  20 mg Oral q1800  . calcitRIOL  1.5 mcg Oral Q M,W,F  . calcium acetate  1,334 mg Oral TID WC  . cinacalcet  120 mg Oral Q M,W,F  . darbepoetin (ARANESP) injection - DIALYSIS  60 mcg Intravenous Q Fri-HD  . folic acid  1 mg Oral Daily  . heparin  5,000 Units Subcutaneous Q8H  . hydrALAZINE  25 mg Oral Q8H  . insulin aspart  0-15 Units Subcutaneous TID AC & HS  . metoprolol succinate  12.5 mg Oral Q T,Th,S,Su  . pantoprazole  40 mg Oral Daily  . tamsulosin  0.4 mg Oral QPC supper   Dialysis  Orders: MWF [facility] 4 hr 15 min 180 NRe 400/800 95.5 kg(new edw ~ 88kg)2.0 K/2.5 Ca  -Heparin 8600 units IV TIW -Calcitriol 2.0 mcg PO TIW -Sensipar 120 mg PO TIW -Venofer 50 mg IV weekly (Last dose 01/13/17) -Mircera 50 mcg IV q 2 weeks (last dose 12/27/16)  Assessment/Plan: 1. AMS resolved 2. L distal fibula fracture- s/p short leg cast and non-weight bearing PT 3. ESRDcontinue with HD q MWFNext HD 02/13/16. 4. Hypertension/Volume: Lower EDW at discharge 5. Anemia: Hgb 9.9, continue weekly Aranesp 6. BMD: Phos ok, Corr Ca slightly ^. Reducing calcitriol down further to 47mcg. 7. Disposition- currently in CIR cont with PT/OT  Paul Penton, PA-C 02/12/2017, 11:47 AM  [facility] Pager: [phone]  Renal Attending: As above. Stable on Rehab.  HD later today. [name]

## 2017-02-12 NOTE — Progress Notes (Signed)
Speech Language Pathology Daily Session Note  Patient Details  Name: [name] MRN: [id number] Date of Birth: 1950-07-16  Today's Date: 02/12/2017 SLP Individual Time: 1030-1100 SLP Individual Time Calculation (min): 30 min  Short Term Goals: Week 2: SLP Short Term Goal 1 (Week 2): Patient will demonstrate functional problem solving for mildly complex tasks with Mod A verbal cues.  SLP Short Term Goal 2 (Week 2): Patient will utilize external aids to recall new, daily information with min A verbal cues.  SLP Short Term Goal 3 (Week 2): Patient will self-monitor and correct errors during functional tasks with supervision verbal cues.  SLP Short Term Goal 4 (Week 2): Patient will demonstrate selective attention to functional task in a mildly distracting enviornment for ~45 minutes with Min A verbal cues for redirection.   Skilled Therapeutic Interventions: Skilled treatment session focused on cognition goals. SLP facilitated session by providing Mod A cues to answer [facility] questions when looking at prescription bottle. Pt then required Max A to transfer information (placing 1 pill in daily pill box 3 times per day). Pt was returned to room and handed off to OT. Continue per current plan of care.      Function:  Eating Eating   Modified Consistency Diet: No Eating Assist Level: No help, No cues           Cognition Comprehension Comprehension assist level: Understands basic 50 - 74% of the time/ requires cueing 25 - 49% of the time  Expression   Expression assist level: Expresses basic 50 - 74% of the time/requires cueing 25 - 49% of the time. Needs to repeat parts of sentences.;Expresses basic 75 - 89% of the time/requires cueing 10 - 24% of the time. Needs helper to occlude trach/needs to repeat words.  Social Interaction Social Interaction assist level: Interacts appropriately 50 - 74% of the time - May be physically or verbally inappropriate.  Problem Solving Problem solving assist  level: Solves basic 50 - 74% of the time/requires cueing 25 - 49% of the time;Solves basic 75 - 89% of the time/requires cueing 10 - 24% of the time  Memory Memory assist level: Recognizes or recalls 50 - 74% of the time/requires cueing 25 - 49% of the time    Pain    Therapy/Group: Individual Therapy  Jeanee Fabre 02/12/2017, 12:42 PM

## 2017-02-12 NOTE — Progress Notes (Signed)
Occupational Therapy Session Note  Patient Details  Name: [name] MRN: [id number] Date of Birth: 09-Mar-1950  Today's Date: 02/12/2017 OT Individual Time: 1100-1151 OT Individual Time Calculation (min): 51 min   Short Term Goals: Week 1:  OT Short Term Goal 1 (Week 1): Pt will perform LB dressing with min A overall.  OT Short Term Goal 2 (Week 1): Pt will perform toilet transfer with use of RW with steady assistance for balance.  OT Short Term Goal 3 (Week 1): Pt will verbalize and demonstrate NWB L LE with min cues for safety with functional transfers.   Skilled Therapeutic Interventions/Progress Updates:    Pt greeted via SLP handoff. Requesting coffee. Worked on pt maintaining L LE NWB while engaged in coffee prep while standing with RW and Min A. He required min cues for elevating foot off of floor while engaging in IADL task with unilateral support on RW. Afterwards pt backed up to w/c, cues provided for extending L LE during stand<sit transition. He requested to complete bathing/dressing w/c level at sink. Pt requiring assist for washing Rt LE and also for threading L LE into pants. He would benefit from reacher in the room to increase functional independence with LB self care. After completing oral care with setup, pt was escorted in w/c to bedside. Assisted him with repositioning for increasing comfort. Pt left with all needs and safety belt applied. Gave him extra hot pack for decreasing trigger finger pain.   Therapy Documentation Precautions:  Precautions Precautions: Fall Required Braces or Orthoses: Other Brace/Splint Other Brace/Splint: Short leg cast/CAM boot order Restrictions Weight Bearing Restrictions: Yes LLE Weight Bearing: Non weight bearing Pain: Addressed above.    ADL:      See Function Navigator for Current Functional Status.  Therapy/Group: Individual Therapy  Minsa Weddington A Mattheu Brodersen 02/12/2017, 12:29 PM

## 2017-02-12 NOTE — Progress Notes (Signed)
. [place] PHYSICAL MEDICINE & REHABILITATION     PROGRESS NOTE  Subjective/Complaints:  Has concerns over his left middle finger which is "stuck".  Also notes an abrasion over his left posterior scalp  ROS: pt denies nausea, vomiting, diarrhea, cough, shortness of breath or chest pain   Objective: Vital Signs: Blood pressure 134/62, pulse 85, temperature 98.4 F (36.9 C), temperature source Oral, resp. rate 18, height 5\' 9"  (1.753 m), weight 88.5 kg (195 lb 1.7 oz), SpO2 99 %. No results found. Recent Labs    02/10/17 1751  WBC 8.2  HGB 9.9*  HCT 29.8*  PLT 353   Recent Labs    02/10/17 1750  NA 132*  K 4.7  CL 94*  GLUCOSE 123*  BUN 37*  CREATININE 9.86*  CALCIUM 10.0   CBG (last 3)  Recent Labs    02/11/17 1626 02/11/17 2120 02/12/17 0656  GLUCAP 97 105* 102*    Wt Readings from Last 3 Encounters:  02/10/17 88.5 kg (195 lb 1.7 oz)  02/03/17 88.7 kg (195 lb 8.8 oz)  01/19/17 95.7 kg (211 lb)    Physical Exam:  BP 134/62   Pulse 85   Temp 98.4 F (36.9 C) (Oral)   Resp 18   Ht 5\' 9"  (1.753 m)   Wt 88.5 kg (195 lb 1.7 oz)   SpO2 99%   BMI 28.81 kg/m  Constitutional: He appears well-developed. Obese  HENT: Normocephalic and atraumatic.  Small abrasion over the posterior left temporoparietal area. Eyes: EOM are normal. No discharge.  Cardiovascular: RRR without murmur. No JVD  Respiratory: CTA Bilaterally without wheezes or rales. Normal effort .  GI: Soft. Bowel sounds are normal.  Musculoskeletal: No edema.    Trigger finger left middle finger Neurological: He is alert and oriented.  Motor: Bilateral upper extremities 5/5 proximal distal Left lower extremity: 5/5 proximal distal (no change) Right lower extremity: 4+-5/5 proximally Skin: Distal LLE with cast Psychiatric: His affect is blunt. He is slowed.    Assessment/Plan: 1. Functional deficits secondary to metabolic encephalopathy which require 3+ hours per day of interdisciplinary  therapy in a comprehensive inpatient rehab setting. Physiatrist is providing close team supervision and 24 hour management of active medical problems listed below. Physiatrist and rehab team continue to assess barriers to discharge/monitor patient progress toward functional and medical goals.  Function:  Bathing Bathing position   Position: Sitting EOB  Bathing parts Body parts bathed by patient: Right arm, Left arm, Chest, Abdomen, Front perineal area, Buttocks, Right upper leg, Left upper leg, Right lower leg Body parts bathed by helper: Back  Bathing assist Assist Level: Touching or steadying assistance(Pt > 75%)      Upper Body Dressing/Undressing Upper body dressing   What is the patient wearing?: Pull over shirt/dress     Pull over shirt/dress - Perfomed by patient: Thread/unthread right sleeve, Thread/unthread left sleeve, Put head through opening, Pull shirt over trunk          Upper body assist Assist Level: Supervision or verbal cues, Set up   Set up : To obtain clothing/put away  Lower Body Dressing/Undressing Lower body dressing   What is the patient wearing?: Pants, Non-skid slipper socks Underwear - Performed by patient: Thread/unthread right underwear leg, Pull underwear up/down Underwear - Performed by helper: Thread/unthread left underwear leg Pants- Performed by patient: Pull pants up/down, Thread/unthread right pants leg Pants- Performed by helper: Thread/unthread left pants leg Non-skid slipper socks- Performed by patient: Don/doff right sock  Non-skid slipper socks- Performed by helper: Don/doff left sock           AFO - Performed by helper: Don/doff left AFO(AFO=CAM boot)      Lower body assist Assist for lower body dressing: Touching or steadying assistance (Pt > 75%)      Toileting Toileting   Toileting steps completed by patient: Adjust clothing prior to toileting, Performs perineal hygiene, Adjust clothing after toileting   Toileting  Assistive Devices: Grab bar or rail  Toileting assist Assist level: Touching or steadying assistance (Pt.75%)   Transfers Chair/bed transfer Chair/bed transfer activity did not occur: Refused Chair/bed transfer method: Stand pivot Chair/bed transfer assist level: Supervision or verbal cues Chair/bed transfer assistive device: Armrests     Locomotion Ambulation Ambulation activity did not occur: Refused   Max distance: 37 Assist level: Touching or steadying assistance (Pt > 75%)   Wheelchair   Type: Manual Max wheelchair distance: 120' Assist Level: Supervision or verbal cues  Cognition Comprehension Comprehension assist level: Understands basic 75 - 89% of the time/ requires cueing 10 - 24% of the time  Expression Expression assist level: Expresses basic 75 - 89% of the time/requires cueing 10 - 24% of the time. Needs helper to occlude trach/needs to repeat words.  Social Interaction Social Interaction assist level: Interacts appropriately 50 - 74% of the time - May be physically or verbally inappropriate.  Problem Solving Problem solving assist level: Solves basic 75 - 89% of the time/requires cueing 10 - 24% of the time  Memory Memory assist level: Recognizes or recalls 50 - 74% of the time/requires cueing 25 - 49% of the time    Medical Problem List and Plan: 1.  Decreased functional mobility secondary to metabolic encephalopathy.   Cont CIR therapies.  2.  DVT Prophylaxis/Anticoagulation: Subcutaneous heparin. Monitor for any bleeding episodes 3. Pain Management: Tylenol as needed 4. Mood: Provide emotional support 5. Neuropsych: This patient is not fully capable of making decisions on his own behalf. 6. Skin/Wound Care: Routine skin checks.  Recommended routine hair and scalp hygiene 7. Fluids/Electrolytes/Nutrition: Routine I&O's 8. End-stage renal disease. Continue hemodialysis as per renal services 9. Hypertension. Toprol 12.5 mg Tuesday Thursday Saturday and Sunday,  Norvasc 10 mg daily   Hydralazine 10 TID started on 12/28, increased to 25 on 12/31 with improved control 10. Diabetes mellitus.SSI. Check blood sugars before meals and at bedtime   Relatively controlled on 1/2 11. Closed Left distal fibular fracture/nondisplaced. Nonweightbearing. Patient will need follow-up with orthopedic services. 12. Hyperlipidemia. Lipitor 13. Leukocytosis: Resolved      14 . Anemia of chronic disease   Hemoglobin 9.3 on 12/31   Labs with HD   Continue to monitor 15. Obesity   Body mass index is 28.81 kg/m.   Diet and exercise education   Will cont to encourage weight loss to increase endurance and promote overall health 16. Left middle trigger finger: Recommended range of motion and strengthening exercises for her left hand.  He can apply heat as well if needed   LOS (Days) 9 A FACE TO FACE EVALUATION WAS PERFORMED  Benedict Kue T 02/12/2017 9:39 AM   '

## 2017-02-12 NOTE — Progress Notes (Signed)
Speech Language Pathology Daily Session Note  Patient Details  Name: [name] MRN: [id number] Date of Birth: November 07, 1950  Today's Date: 02/12/2017 SLP Individual Time: 0825-0920 SLP Individual Time Calculation (min): 55 min  Short Term Goals: Week 2: SLP Short Term Goal 1 (Week 2): Patient will demonstrate functional problem solving for mildly complex tasks with Mod A verbal cues.  SLP Short Term Goal 2 (Week 2): Patient will utilize external aids to recall new, daily information with min A verbal cues.  SLP Short Term Goal 3 (Week 2): Patient will self-monitor and correct errors during functional tasks with supervision verbal cues.  SLP Short Term Goal 4 (Week 2): Patient will demonstrate selective attention to functional task in a mildly distracting enviornment for ~45 minutes with Min A verbal cues for redirection.   Skilled Therapeutic Interventions:Skilled treatment session focused on completion of medication management task. SLP facilitated session by providing Max-Total A multimodal cues for problem solving, organization and emergent awareness of errors during a mildly complex, new learning task of organizing a 4 time per day pill box.  Patient reported he understands he was not been taking his medications appropriately at home and would be willing to utilize with the assistance of his daughter at discharge. Patient left upright in wheelchair with quick release belt in place and all needs within reach. Continue with current plan of care.      Function:   Cognition Comprehension Comprehension assist level: Understands basic 75 - 89% of the time/ requires cueing 10 - 24% of the time  Expression   Expression assist level: Expresses basic 75 - 89% of the time/requires cueing 10 - 24% of the time. Needs helper to occlude trach/needs to repeat words.  Social Interaction Social Interaction assist level: Interacts appropriately 50 - 74% of the time - May be physically or verbally  inappropriate.  Problem Solving Problem solving assist level: Solves basic 75 - 89% of the time/requires cueing 10 - 24% of the time  Memory Memory assist level: Recognizes or recalls 50 - 74% of the time/requires cueing 25 - 49% of the time    Pain No/Denies Pain   Therapy/Group: Individual Therapy  Jenie Parish 02/12/2017, 10:19 AM

## 2017-02-13 ENCOUNTER — Inpatient Hospital Stay (HOSPITAL_COMMUNITY): Payer: Medicare Other | Admitting: Physical Therapy

## 2017-02-13 DIAGNOSIS — R5381 Other malaise: Secondary | ICD-10-CM

## 2017-02-13 LAB — GLUCOSE, CAPILLARY
GLUCOSE-CAPILLARY: 96 mg/dL (ref 65–99)
GLUCOSE-CAPILLARY: 119 mg/dL — AB (ref 65–99)
Glucose-Capillary: 94 mg/dL (ref 65–99)
Glucose-Capillary: 105 mg/dL — ABNORMAL HIGH (ref 65–99)

## 2017-02-13 NOTE — Progress Notes (Signed)
  [facility] Progress Note   Subjective:  Seen in room. Lying in bed. HD yesterday with no c/os.   Objective Vitals:   02/12/17 2200 02/12/17 2215 02/12/17 2226 02/12/17 2303  BP: (!) 158/71 139/65 (!) 159/76 (!) 149/72  Pulse: (!) 102 (!) 101 (!) 102 (!) 103  Resp:   16 18  Temp:   98 F (36.7 C) 98.2 F (36.8 C)  TempSrc:   Oral Oral  SpO2:   98% 97%  Weight:   97.8 kg (215 lb 9.8 oz)   Height:       Physical Exam General: Well appearing man, NAD. Heart: RRR; no murmur Lungs: CTAB Extremities: No LE edema, L leg in cast Dialysis Access: R AVF + thrill  Additional Objective Labs: Basic Metabolic Panel: Recent Labs  Lab 02/08/17 0011 02/10/17 1750 02/12/17 1849  NA 130* 132* 131*  K 5.2* 4.7 4.3  CL 92* 94* 94*  CO2 22 27 27   GLUCOSE 97 123* 113*  BUN 70* 37* 30*  CREATININE 12.80* 9.86* 9.38*  CALCIUM 9.9 10.0 10.4*  PHOS 5.4* 4.7* 4.4   Liver Function Tests: Recent Labs  Lab 02/08/17 0011 02/10/17 1750 02/12/17 1849  ALBUMIN 3.6 3.5 3.6   CBC: Recent Labs  Lab 02/08/17 0011 02/10/17 1751 02/12/17 1850  WBC 9.3 8.2 7.5  HGB 9.3* 9.9* 9.5*  HCT 27.5* 29.8* 29.4*  MCV 101.5* 103.5* 105.0*  PLT 330 353 296   Blood Culture    Component Value Date/Time   SDES URINE, CLEAN CATCH 02/08/2017 1311   SPECREQUEST NONE 02/08/2017 1311   CULT <10,000 COLONIES/mL INSIGNIFICANT GROWTH (A) 02/08/2017 1311   REPTSTATUS 02/09/2017 FINAL 02/08/2017 1311   Medications:  . amLODipine  10 mg Oral Daily  . atorvastatin  20 mg Oral q1800  . [START ON 02/15/2017] calcitRIOL  1 mcg Oral Q M,W,F  . calcium acetate  1,334 mg Oral TID WC  . cinacalcet  120 mg Oral Q M,W,F  . darbepoetin (ARANESP) injection - DIALYSIS  60 mcg Intravenous Q Fri-HD  . folic acid  1 mg Oral Daily  . heparin  5,000 Units Subcutaneous Q8H  . hydrALAZINE  25 mg Oral Q8H  . insulin aspart  0-15 Units Subcutaneous TID AC & HS  . metoprolol succinate  12.5 mg Oral Q  T,Th,S,Su  . pantoprazole  40 mg Oral Daily  . tamsulosin  0.4 mg Oral QPC supper   Dialysis Orders: MWF [facility] 4 hr 15 min 180 NRe 400/800 95.5 kg(new edw ~ 88kg)2.0 K/2.5 Ca  -Heparin 8600 units IV TIW -Calcitriol 2.0 mcg PO TIW -Sensipar 120 mg PO TIW -Venofer 50 mg IV weekly (Last dose 01/13/17) -Mircera 50 mcg IV q 2 weeks (last dose 12/27/16)  Assessment/Plan: 1. AMS resolved 2. L distal fibula fracture- s/p short leg cast and non-weight bearing PT 3. ESRDcontinue with HD q MWFNext HD 02/15/17 4. Hypertension/Volume: Lower EDW at discharge 5. Anemia: Hgb 9.9, continue weekly Aranesp 6. BMD: Phos ok, Corr Ca slightly ^. Reducing calcitriol down further to 78mcg. 7. Disposition- currently in CIR cont with PT/OT  Paul Child PA-C [facility] Pager [phone] 02/13/2017,11:15 AM  Renal Attending: I agree with note as articulated  [name]

## 2017-02-13 NOTE — Progress Notes (Signed)
Physical Therapy Session Note  Patient Details  Name: [name] MRN: [id number] Date of Birth: 02/19/1950  Today's Date: 02/13/2017 PT Individual Time: 1525-1605 PT Individual Time Calculation (min): 40 min   Short Term Goals: Week 2:  PT Short Term Goal 1 (Week 2): STG=LTG due to LOS  Skilled Therapeutic Interventions/Progress Updates:   Pt in w/c upon arrival and agreeable to therapy, no c/o pain. Supervision w/c mobility to/from gym w/ BUEs, 200' each way. Worked on functional mobility in gym while ambulating in both controlled and home-like environments, 30-40' during each bout w/ min guard. Verbal cues to decrease speed for safety and maintain NWB on LLE. L LAQs in 10-12 reps during seated rest breaks. Returned to room and ended session in w/c, call bell within reach and all needs met.   Therapy Documentation Precautions:  Precautions Precautions: Fall Required Braces or Orthoses: Other Brace/Splint Other Brace/Splint: Short leg cast/CAM boot order Restrictions Weight Bearing Restrictions: Yes LLE Weight Bearing: Non weight bearing Vital Signs: Therapy Vitals Temp: 98.8 F (37.1 C) Temp Source: Oral Pulse Rate: 84 Resp: 16 BP: 125/63 Patient Position (if appropriate): Lying Oxygen Therapy SpO2: 100 % O2 Device: Not Delivered  See Function Navigator for Current Functional Status.   Therapy/Group: Individual Therapy  [name] 02/13/2017, 5:07 PM

## 2017-02-13 NOTE — Progress Notes (Signed)
Occupational Therapy Weekly Progress Note  Patient Details  Name: [name] MRN: [id number] Date of Birth: [birth year]  Beginning of progress report period: 02/04/17 End of progress report period: 02/13/17  Patient has met 2 of 3 short term goals.    Pt has made steady progress at time of report. He has improved ability to maintain L LE NWB during functional stand pivot transfers and during LB self care tasks, however still requires min-mod cues. At time of eval, he required max-total cues for adherence. He has improved from requiring Mod A for bathing and Max A for dressing, to now completing these ADLs with Min and Mod A respectively. Plan to initiate AE training for LTG achievement. Family education is scheduled for next week for safe transition home.   Patient continues to demonstrate the following deficits: muscle weakness, decreased cardiorespiratoy endurance, decreased awareness, decreased problem solving and decreased safety awareness and decreased standing balance, decreased postural control, decreased balance strategies and difficulty maintaining precautions and therefore will continue to benefit from skilled OT intervention to enhance overall performance with BADL.  Patient progressing toward long term goals..  Continue plan of care.  OT Short Term Goals Week 1:  OT Short Term Goal 1 (Week 1): Pt will perform LB dressing with min A overall.  OT Short Term Goal 1 - Progress (Week 1): Not met OT Short Term Goal 2 (Week 1): Pt will perform toilet transfer with use of RW with steady assistance for balance.  OT Short Term Goal 2 - Progress (Week 1): Met OT Short Term Goal 3 (Week 1): Pt will verbalize and demonstrate NWB L LE with min cues for safety with functional transfers.  OT Short Term Goal 3 - Progress (Week 1): Met Week 2:  OT Short Term Goal 1 (Week 2): STGs=LTGs due to ELOS  Therapy Documentation Precautions:  Precautions Precautions: Fall Required Braces or Orthoses:  Other Brace/Splint Other Brace/Splint: Short leg cast/CAM boot order Restrictions Weight Bearing Restrictions: Yes LLE Weight Bearing: Non weight bearing Vital Signs: Therapy Vitals Temp: 98.8 F (37.1 C) Temp Source: Oral Pulse Rate: 84 Resp: 16 BP: 125/63 Patient Position (if appropriate): Lying Oxygen Therapy SpO2: 100 % O2 Device: Not Delivered   ADL:      See Function Navigator for Current Functional Status.   Therapy/Group: Individual Therapy  Jilene Spohr A Arlen Dupuis 02/13/2017, 4:49 PM

## 2017-02-13 NOTE — Progress Notes (Signed)
. [place] PHYSICAL MEDICINE & REHABILITATION     PROGRESS NOTE  Subjective/Complaints:  Occasional symptomatic left middle  trigger finger  ROS: pt denies nausea, vomiting, diarrhea, cough, shortness of breath or chest pain   Objective: Vital Signs: Blood pressure (!) 149/72, pulse (!) 103, temperature 98.2 F (36.8 C), temperature source Oral, resp. rate 18, height 5\' 9"  (1.753 m), weight 97.8 kg (215 lb 9.8 oz), SpO2 97 %. No results found. Recent Labs    02/10/17 1751 02/12/17 1850  WBC 8.2 7.5  HGB 9.9* 9.5*  HCT 29.8* 29.4*  PLT 353 296   Recent Labs    02/10/17 1750 02/12/17 1849  NA 132* 131*  K 4.7 4.3  CL 94* 94*  GLUCOSE 123* 113*  BUN 37* 30*  CREATININE 9.86* 9.38*  CALCIUM 10.0 10.4*   CBG (last 3)  Recent Labs    02/12/17 2305 02/13/17 0643 02/13/17 1152  GLUCAP 97 96 94    Wt Readings from Last 3 Encounters:  02/12/17 97.8 kg (215 lb 9.8 oz)  02/03/17 88.7 kg (195 lb 8.8 oz)  01/19/17 95.7 kg (211 lb)    Physical Exam:  BP (!) 149/72 (BP Location: Left Arm)   Pulse (!) 103   Temp 98.2 F (36.8 C) (Oral)   Resp 18   Ht 5\' 9"  (1.753 m)   Wt 97.8 kg (215 lb 9.8 oz)   SpO2 97%   BMI 31.84 kg/m  Constitutional: He appears well-developed. Obese  HENT: Normocephalic and atraumatic.  Small abrasion over the posterior left temporoparietal area. Eyes: EOM are normal. No discharge.  Cardiovascular: RRR without murmur. No JVD  Respiratory: CTA Bilaterally without wheezes or rales. Normal effort .  GI: Soft. Bowel sounds are normal.  Musculoskeletal: No edema.    Trigger finger left middle finger Neurological: He is alert and oriented.  Motor: Bilateral upper extremities 5/5 proximal distal Left lower extremity: 5/5 proximal distal (no change) Right lower extremity: 4+-5/5 proximally Skin: Distal LLE with cast Psychiatric: His affect is blunt. He is slowed.    Assessment/Plan: 1. Functional deficits secondary to metabolic  encephalopathy which require 3+ hours per day of interdisciplinary therapy in a comprehensive inpatient rehab setting. Physiatrist is providing close team supervision and 24 hour management of active medical problems listed below. Physiatrist and rehab team continue to assess barriers to discharge/monitor patient progress toward functional and medical goals.  Function:  Bathing Bathing position   Position: Wheelchair/chair at sink  Bathing parts Body parts bathed by patient: Right arm, Left arm, Chest, Abdomen, Front perineal area, Buttocks, Right upper leg, Left upper leg, Right lower leg Body parts bathed by helper: Right lower leg, Back  Bathing assist Assist Level: Touching or steadying assistance(Pt > 75%)      Upper Body Dressing/Undressing Upper body dressing   What is the patient wearing?: Hospital gown     Pull over shirt/dress - Perfomed by patient: Thread/unthread right sleeve, Thread/unthread left sleeve, Put head through opening, Pull shirt over trunk          Upper body assist Assist Level: Set up   Set up : To obtain clothing/put away  Lower Body Dressing/Undressing Lower body dressing   What is the patient wearing?: Pants, Non-skid slipper socks Underwear - Performed by patient: Thread/unthread right underwear leg, Pull underwear up/down Underwear - Performed by helper: Thread/unthread left underwear leg Pants- Performed by patient: Pull pants up/down, Thread/unthread right pants leg Pants- Performed by helper: Thread/unthread left pants leg  Non-skid slipper socks- Performed by patient: Don/doff right sock Non-skid slipper socks- Performed by helper: Don/doff right sock, Don/doff left sock           AFO - Performed by helper: Don/doff left AFO(AFO=CAM boot)      Lower body assist Assist for lower body dressing: Touching or steadying assistance (Pt > 75%)      Toileting Toileting   Toileting steps completed by patient: Adjust clothing prior to  toileting, Performs perineal hygiene, Adjust clothing after toileting   Toileting Assistive Devices: Grab bar or rail  Toileting assist Assist level: Touching or steadying assistance (Pt.75%)   Transfers Chair/bed transfer Chair/bed transfer activity did not occur: Refused Chair/bed transfer method: Stand pivot Chair/bed transfer assist level: Touching or steadying assistance (Pt > 75%) Chair/bed transfer assistive device: Armrests, Medical sales representative Ambulation activity did not occur: Refused   Max distance: 40 Assist level: Touching or steadying assistance (Pt > 75%)   Wheelchair   Type: Manual Max wheelchair distance: 120' Assist Level: Dependent (Pt equals 0%)(due to painful trigger finger L hand)  Cognition Comprehension Comprehension assist level: Understands basic 50 - 74% of the time/ requires cueing 25 - 49% of the time  Expression Expression assist level: Expresses basic 50 - 74% of the time/requires cueing 25 - 49% of the time. Needs to repeat parts of sentences., Expresses basic 75 - 89% of the time/requires cueing 10 - 24% of the time. Needs helper to occlude trach/needs to repeat words.  Social Interaction Social Interaction assist level: Interacts appropriately 50 - 74% of the time - May be physically or verbally inappropriate.  Problem Solving Problem solving assist level: Solves basic 50 - 74% of the time/requires cueing 25 - 49% of the time, Solves basic 75 - 89% of the time/requires cueing 10 - 24% of the time  Memory Memory assist level: Recognizes or recalls 50 - 74% of the time/requires cueing 25 - 49% of the time    Medical Problem List and Plan: 1.  Decreased functional mobility secondary to metabolic encephalopathy.   Cont CIR therapies.  PT OT speech 2.  DVT Prophylaxis/Anticoagulation: Subcutaneous heparin. Monitor for any bleeding episodes 3. Pain Management: Tylenol as needed 4. Mood: Provide emotional support 5. Neuropsych: This patient  is not fully capable of making decisions on his own behalf. 6. Skin/Wound Care: Routine skin checks.  Recommended routine hair and scalp hygiene 7. Fluids/Electrolytes/Nutrition: Routine I&O's 8. End-stage renal disease. Continue hemodialysis as per renal services 9. Hypertension. Toprol 12.5 mg Tuesday Thursday Saturday and Sunday, Norvasc 10 mg daily   Hydralazine 10 TID started on 12/28, increased to 25 on 12/31 with improved control Vitals:   02/12/17 2226 02/12/17 2303  BP: (!) 159/76 (!) 149/72  Pulse: (!) 102 (!) 103  Resp: 16 18  Temp: 98 F (36.7 C) 98.2 F (36.8 C)  SpO2: 98% 97%   10. Diabetes mellitus.SSI. Check blood sugars before meals and at bedtime CBG (last 3)  Recent Labs    02/12/17 2305 02/13/17 0643 02/13/17 1152  GLUCAP 97 96 94   controlled on 1/5 11. Closed Left distal fibular fracture/nondisplaced. Nonweightbearing. Patient will need follow-up with orthopedic services. 12. Hyperlipidemia. Lipitor 13. Leukocytosis: Resolved      14 . Anemia of chronic disease   Hemoglobin 9.3 on 12/31   Labs with HD   Continue to monitor 15. Obesity   Body mass index is 31.84 kg/m.   Diet and exercise education  Will cont to encourage weight loss to increase endurance and promote overall health 16. Left middle trigger finger: Recommended range of motion and strengthening exercises for her left hand.  He can apply heat as well if needed   LOS (Days) 10 A FACE TO FACE EVALUATION WAS PERFORMED  Charlett Blake 02/13/2017 12:14 PM   '

## 2017-02-14 LAB — GLUCOSE, CAPILLARY
GLUCOSE-CAPILLARY: 102 mg/dL — AB (ref 65–99)
GLUCOSE-CAPILLARY: 107 mg/dL — AB (ref 65–99)
GLUCOSE-CAPILLARY: 120 mg/dL — AB (ref 65–99)
Glucose-Capillary: 95 mg/dL (ref 65–99)

## 2017-02-14 NOTE — Progress Notes (Signed)
. [place] PHYSICAL MEDICINE & REHABILITATION     PROGRESS NOTE  Subjective/Complaints:  Occasional symptomatic left middle  trigger finger, patient would like something more done about this he has tried heat without effect  ROS: pt denies nausea, vomiting, diarrhea, cough, shortness of breath or chest pain   Objective: Vital Signs: Blood pressure (!) 148/73, pulse 91, temperature (!) 97.5 F (36.4 C), temperature source Oral, resp. rate 18, height 5\' 9"  (1.753 m), weight 97.8 kg (215 lb 9.8 oz), SpO2 96 %. No results found. Recent Labs    02/12/17 1850  WBC 7.5  HGB 9.5*  HCT 29.4*  PLT 296   Recent Labs    02/12/17 1849  NA 131*  K 4.3  CL 94*  GLUCOSE 113*  BUN 30*  CREATININE 9.38*  CALCIUM 10.4*   CBG (last 3)  Recent Labs    02/13/17 2120 02/14/17 0640 02/14/17 1131  GLUCAP 119* 102* 107*    Wt Readings from Last 3 Encounters:  02/12/17 97.8 kg (215 lb 9.8 oz)  02/03/17 88.7 kg (195 lb 8.8 oz)  01/19/17 95.7 kg (211 lb)    Physical Exam:  BP (!) 148/73 (BP Location: Left Arm)   Pulse 91   Temp (!) 97.5 F (36.4 C) (Oral)   Resp 18   Ht 5\' 9"  (1.753 m)   Wt 97.8 kg (215 lb 9.8 oz)   SpO2 96%   BMI 31.84 kg/m  Constitutional: He appears well-developed. Obese  HENT: Normocephalic and atraumatic.  Small abrasion over the posterior left temporoparietal area. Eyes: EOM are normal. No discharge.  Cardiovascular: RRR without murmur. No JVD  Respiratory: CTA Bilaterally without wheezes or rales. Normal effort .  GI: Soft. Bowel sounds are normal.  Musculoskeletal: No edema.    Trigger finger left middle finger Neurological: He is alert and oriented.  Motor: Bilateral upper extremities 5/5 proximal distal Left lower extremity: 5/5 proximal distal (no change) Right lower extremity: 4+-5/5 proximally Skin: Distal LLE with cast Psychiatric: His affect is blunt. He is slowed.    Assessment/Plan: 1. Functional deficits secondary to metabolic  encephalopathy which require 3+ hours per day of interdisciplinary therapy in a comprehensive inpatient rehab setting. Physiatrist is providing close team supervision and 24 hour management of active medical problems listed below. Physiatrist and rehab team continue to assess barriers to discharge/monitor patient progress toward functional and medical goals.  Function:  Bathing Bathing position   Position: Wheelchair/chair at sink  Bathing parts Body parts bathed by patient: Right arm, Left arm, Chest, Abdomen, Front perineal area, Buttocks, Right upper leg, Left upper leg, Right lower leg Body parts bathed by helper: Right lower leg, Back  Bathing assist Assist Level: Touching or steadying assistance(Pt > 75%)      Upper Body Dressing/Undressing Upper body dressing   What is the patient wearing?: Hospital gown     Pull over shirt/dress - Perfomed by patient: Thread/unthread right sleeve, Thread/unthread left sleeve, Put head through opening, Pull shirt over trunk          Upper body assist Assist Level: Set up   Set up : To obtain clothing/put away  Lower Body Dressing/Undressing Lower body dressing   What is the patient wearing?: Pants, Non-skid slipper socks Underwear - Performed by patient: Thread/unthread right underwear leg, Pull underwear up/down Underwear - Performed by helper: Thread/unthread left underwear leg Pants- Performed by patient: Pull pants up/down, Thread/unthread right pants leg Pants- Performed by helper: Thread/unthread left pants leg Non-skid  slipper socks- Performed by patient: Don/doff right sock Non-skid slipper socks- Performed by helper: Don/doff right sock, Don/doff left sock           AFO - Performed by helper: Don/doff left AFO(AFO=CAM boot)      Lower body assist Assist for lower body dressing: Touching or steadying assistance (Pt > 75%)      Toileting Toileting   Toileting steps completed by patient: Performs perineal  hygiene Toileting steps completed by helper: Adjust clothing prior to toileting, Adjust clothing after toileting Toileting Assistive Devices: Grab bar or rail  Toileting assist Assist level: Touching or steadying assistance (Pt.75%)   Transfers Chair/bed transfer Chair/bed transfer activity did not occur: Refused Chair/bed transfer method: Ambulatory Chair/bed transfer assist level: Touching or steadying assistance (Pt > 75%) Chair/bed transfer assistive device: Armrests, Medical sales representative Ambulation activity did not occur: Refused   Max distance: 40 Assist level: Touching or steadying assistance (Pt > 75%)   Wheelchair   Type: Manual Max wheelchair distance: 200' Assist Level: Supervision or verbal cues  Cognition Comprehension Comprehension assist level: Understands basic 50 - 74% of the time/ requires cueing 25 - 49% of the time  Expression Expression assist level: Expresses basic 50 - 74% of the time/requires cueing 25 - 49% of the time. Needs to repeat parts of sentences.  Social Interaction Social Interaction assist level: Interacts appropriately 50 - 74% of the time - May be physically or verbally inappropriate.  Problem Solving Problem solving assist level: Solves basic 50 - 74% of the time/requires cueing 25 - 49% of the time  Memory Memory assist level: Recognizes or recalls 50 - 74% of the time/requires cueing 25 - 49% of the time    Medical Problem List and Plan: 1.  Decreased functional mobility secondary to metabolic encephalopathy.   Cont CIR therapies.  PT OT speech 2.  DVT Prophylaxis/Anticoagulation: Subcutaneous heparin. Monitor for any bleeding episodes 3. Pain Management: Tylenol as needed 4. Mood: Provide emotional support 5. Neuropsych: This patient is not fully capable of making decisions on his own behalf. 6. Skin/Wound Care: Routine skin checks.  Recommended routine hair and scalp hygiene 7. Fluids/Electrolytes/Nutrition: Routine  I&O's 8. End-stage renal disease. Continue hemodialysis as per renal services, appreciate nephrology note today 9. Hypertension. Toprol 12.5 mg Tuesday Thursday Saturday and Sunday, Norvasc 10 mg daily   Hydralazine 10 TID started on 12/28, increased to 25 on 12/31 with improved control Vitals:   02/13/17 2102 02/14/17 0547  BP: 138/71 (!) 148/73  Pulse:  91  Resp:  18  Temp:  (!) 97.5 F (36.4 C)  SpO2:  96%   10. Diabetes mellitus.SSI. Check blood sugars before meals and at bedtime CBG (last 3)  Recent Labs    02/13/17 2120 02/14/17 0640 02/14/17 1131  GLUCAP 119* 102* 107*   controlled on 1/6 11. Closed Left distal fibular fracture/nondisplaced. Nonweightbearing. Patient will need follow-up with orthopedic services. 12. Hyperlipidemia. Lipitor 13. Leukocytosis: Resolved      14 . Anemia of chronic disease   Hemoglobin 9.3 on 12/31   Labs with HD   Continue to monitor 15. Obesity   Body mass index is 31.84 kg/m.   Diet and exercise education   Will cont to encourage weight loss to increase endurance and promote overall health 16. Left middle trigger finger: Recommended range of motion and strengthening exercises for her left hand.  He can apply heat as well if needed, will order resting hand splint to  be used at night   LOS (Days) 11 A FACE TO FACE EVALUATION WAS PERFORMED  Charlett Blake 02/14/2017 11:59 AM   '

## 2017-02-14 NOTE — Progress Notes (Signed)
  [facility] Progress Note   Subjective:  Seen in room. Sitting up in bed. No c/os this morning.   Objective Vitals:   02/12/17 2303 02/13/17 1330 02/13/17 2102 02/14/17 0547  BP: (!) 149/72 125/63 138/71 (!) 148/73  Pulse: (!) 103 84  91  Resp: 18 16  18   Temp: 98.2 F (36.8 C) 98.8 F (37.1 C)  (!) 97.5 F (36.4 C)  TempSrc: Oral Oral  Oral  SpO2: 97% 100%  96%  Weight:      Height:       Physical Exam General: Well appearing man, NAD. Heart: RRR; no murmur Lungs: CTAB Extremities: No LE edema, L leg in cast Dialysis Access: R AVF + thrill  Additional Objective Labs: Basic Metabolic Panel: Recent Labs  Lab 02/08/17 0011 02/10/17 1750 02/12/17 1849  NA 130* 132* 131*  K 5.2* 4.7 4.3  CL 92* 94* 94*  CO2 22 27 27   GLUCOSE 97 123* 113*  BUN 70* 37* 30*  CREATININE 12.80* 9.86* 9.38*  CALCIUM 9.9 10.0 10.4*  PHOS 5.4* 4.7* 4.4   Liver Function Tests: Recent Labs  Lab 02/08/17 0011 02/10/17 1750 02/12/17 1849  ALBUMIN 3.6 3.5 3.6   CBC: Recent Labs  Lab 02/08/17 0011 02/10/17 1751 02/12/17 1850  WBC 9.3 8.2 7.5  HGB 9.3* 9.9* 9.5*  HCT 27.5* 29.8* 29.4*  MCV 101.5* 103.5* 105.0*  PLT 330 353 296   Blood Culture    Component Value Date/Time   SDES URINE, CLEAN CATCH 02/08/2017 1311   SPECREQUEST NONE 02/08/2017 1311   CULT <10,000 COLONIES/mL INSIGNIFICANT GROWTH (A) 02/08/2017 1311   REPTSTATUS 02/09/2017 FINAL 02/08/2017 1311   Medications:  . amLODipine  10 mg Oral Daily  . atorvastatin  20 mg Oral q1800  . [START ON 02/15/2017] calcitRIOL  1 mcg Oral Q M,W,F  . calcium acetate  1,334 mg Oral TID WC  . cinacalcet  120 mg Oral Q M,W,F  . darbepoetin (ARANESP) injection - DIALYSIS  60 mcg Intravenous Q Fri-HD  . folic acid  1 mg Oral Daily  . heparin  5,000 Units Subcutaneous Q8H  . hydrALAZINE  25 mg Oral Q8H  . insulin aspart  0-15 Units Subcutaneous TID AC & HS  . metoprolol succinate  12.5 mg Oral Q T,Th,S,Su  .  pantoprazole  40 mg Oral Daily  . tamsulosin  0.4 mg Oral QPC supper   Dialysis Orders: MWF [facility] 4 hr 15 min 180 NRe 400/800 95.5 kg(new edw ~ 88kg)2.0 K/2.5 Ca  -Heparin 8600 units IV TIW -Calcitriol 2.0 mcg PO TIW -Sensipar 120 mg PO TIW -Venofer 50 mg IV weekly (Last dose 01/13/17) -Mircera 50 mcg IV q 2 weeks (last dose 12/27/16)  Assessment/Plan: 1. AMS resolved 2. L distal fibula fracture- s/p short leg cast and non-weight bearing PT 3. ESRDcontinue with HD q MWFNext HD 02/15/17  4. Hypertension/Volume: Lower EDW at discharge 5. Anemia: Hgb 9.5  continue weekly Aranesp 6. BMD: Phos ok, Cor Ca^^. Hold Calcitriol for now and follow. On PhosLo binder/Sensipar  7. Disposition- currently in CIR cont with PT/OT  [name] PA-C [facility] Pager [phone] 02/14/2017,11:24 AM   Renal Attending; I agree with note as articulated above. Estanislado Emms

## 2017-02-15 ENCOUNTER — Inpatient Hospital Stay (HOSPITAL_COMMUNITY): Payer: Medicare Other | Admitting: Occupational Therapy

## 2017-02-15 ENCOUNTER — Inpatient Hospital Stay (HOSPITAL_COMMUNITY): Payer: Medicare Other | Admitting: Speech Pathology

## 2017-02-15 ENCOUNTER — Ambulatory Visit (HOSPITAL_COMMUNITY): Payer: Medicare Other

## 2017-02-15 LAB — RENAL FUNCTION PANEL
Albumin: 3.5 g/dL (ref 3.5–5.0)
Anion gap: 14 (ref 5–15)
BUN: 36 mg/dL — AB (ref 6–20)
CHLORIDE: 93 mmol/L — AB (ref 101–111)
CO2: 23 mmol/L (ref 22–32)
Calcium: 10.6 mg/dL — ABNORMAL HIGH (ref 8.9–10.3)
Creatinine, Ser: 10.22 mg/dL — ABNORMAL HIGH (ref 0.61–1.24)
GFR calc Af Amer: 5 mL/min — ABNORMAL LOW (ref >60)
GFR, EST NON AFRICAN AMERICAN: 5 mL/min — AB (ref >60)
Glucose, Bld: 83 mg/dL (ref 65–99)
PHOSPHORUS: 4.2 mg/dL (ref 2.5–4.6)
POTASSIUM: 4.5 mmol/L (ref 3.5–5.1)
Sodium: 130 mmol/L — ABNORMAL LOW (ref 135–145)

## 2017-02-15 LAB — CBC
HEMATOCRIT: 30.5 % — AB (ref 39.0–52.0)
HEMOGLOBIN: 10 g/dL — AB (ref 13.0–17.0)
MCH: 34.1 pg — ABNORMAL HIGH (ref 26.0–34.0)
MCHC: 32.8 g/dL (ref 30.0–36.0)
MCV: 104.1 fL — AB (ref 78.0–100.0)
Platelets: 253 10*3/uL (ref 150–400)
RBC: 2.93 MIL/uL — ABNORMAL LOW (ref 4.22–5.81)
RDW: 14.8 % (ref 11.5–15.5)
WBC: 7.6 10*3/uL (ref 4.0–10.5)

## 2017-02-15 LAB — GLUCOSE, CAPILLARY
GLUCOSE-CAPILLARY: 114 mg/dL — AB (ref 65–99)
Glucose-Capillary: 105 mg/dL — ABNORMAL HIGH (ref 65–99)
Glucose-Capillary: 81 mg/dL (ref 65–99)
Glucose-Capillary: 80 mg/dL (ref 65–99)

## 2017-02-15 MED ORDER — LIDOCAINE-PRILOCAINE 2.5-2.5 % EX CREA
1.0000 "application " | TOPICAL_CREAM | CUTANEOUS | Status: DC | PRN
  Filled 2017-02-15: qty 5

## 2017-02-15 MED ORDER — CHLORPROMAZINE HCL 10 MG PO TABS
5.0000 mg | ORAL_TABLET | Freq: Once | ORAL | Status: AC
  Administered 2017-02-15: 5 mg via ORAL
  Filled 2017-02-15: qty 1

## 2017-02-15 MED ORDER — HEPARIN SODIUM (PORCINE) 1000 UNIT/ML DIALYSIS
20.0000 [IU]/kg | INTRAMUSCULAR | Status: DC | PRN
  Filled 2017-02-15: qty 2

## 2017-02-15 MED ORDER — LIDOCAINE HCL (PF) 1 % IJ SOLN
5.0000 mL | INTRAMUSCULAR | Status: DC | PRN
  Filled 2017-02-15: qty 5

## 2017-02-15 MED ORDER — HEPARIN SODIUM (PORCINE) 1000 UNIT/ML DIALYSIS
1000.0000 [IU] | INTRAMUSCULAR | Status: DC | PRN
  Filled 2017-02-15: qty 1

## 2017-02-15 MED ORDER — PENTAFLUOROPROP-TETRAFLUOROETH EX AERO: 1.0000 "application " | INHALATION_SPRAY | CUTANEOUS | Status: DC | PRN

## 2017-02-15 MED ORDER — SODIUM CHLORIDE 0.9 % IV SOLN: 100.0000 mL | INTRAVENOUS | Status: DC | PRN

## 2017-02-15 MED ORDER — SENNOSIDES-DOCUSATE SODIUM 8.6-50 MG PO TABS
3.0000 | ORAL_TABLET | ORAL | Status: DC | PRN
  Administered 2017-02-15: 3 via ORAL
  Filled 2017-02-15: qty 3

## 2017-02-15 NOTE — Progress Notes (Signed)
Occupational Therapy Session Note  Patient Details  Name: [name] MRN: [id number] Date of Birth: [birth year]  Today's Date: 02/15/2017 OT Individual Time: 1300-1340 OT Individual Time Calculation (min): 40 min    Short Term Goals: Week 1:  OT Short Term Goal 1 (Week 1): Pt will perform LB dressing with min A overall.  OT Short Term Goal 1 - Progress (Week 1): Not met OT Short Term Goal 2 (Week 1): Pt will perform toilet transfer with use of RW with steady assistance for balance.  OT Short Term Goal 2 - Progress (Week 1): Met OT Short Term Goal 3 (Week 1): Pt will verbalize and demonstrate NWB L LE with min cues for safety with functional transfers.  OT Short Term Goal 3 - Progress (Week 1): Met Week 2:  OT Short Term Goal 1 (Week 2): STGs=LTGs due to ELOS  Skilled Therapeutic Interventions/Progress Updates:    Upon entering the room, pt supine in bed with daughter present for hands on family training. OT educated caregiver on Pt progress, set up of home environment, equipment recommendations, and safety concerns. Caregiver verbalized understanding. She provided supervision and cues as needed to maintain NWB while pt ambulated to bathroom for toileting needs. Pt returning to sit on EOB at end of session. Caregiver with no further questions at this time. OT exited the room with all needs within reach for pt.   Therapy Documentation Precautions:  Precautions Precautions: Fall Required Braces or Orthoses: Other Brace/Splint Other Brace/Splint: Short leg cast/CAM boot order Restrictions Weight Bearing Restrictions: Yes LLE Weight Bearing: Non weight bearing  See Function Navigator for Current Functional Status.   Therapy/Group: Individual Therapy  [name] 02/15/2017, 1:45 PM

## 2017-02-15 NOTE — Progress Notes (Signed)
Physical Therapy Session Note  Patient Details  Name: [name] MRN: [id number] Date of Birth: 08/18/1950  Today's Date: 02/15/2017 PT Individual Time: 1415-1510 PT Individual Time Calculation (min): 55 min   Short Term Goals: Week 2:  PT Short Term Goal 1 (Week 2): STG=LTG due to LOS  Skilled Therapeutic Interventions/Progress Updates:    Patient seen with daughter present for caregiver training.  Patient in bed and able to come to sit with S and transfer to w/c with RW and S with cues for NWB L LE.  Educated daughter in need for cues and safe guarding techniques with pt for transfers, ambulation and stairs.  She return demonstrated all including car transfers.  Patient performed w/c mobility with S x 200' and cues for parts management.  Stair negotiation with min A and mod cues for NWB with PT then with daughter assist.  Performed car transfer from low SUV simulated height with minguard to S A.  Demonstrated w/c management for transport to daughter and she verbalized understanding.  Patient assisted to room in w/c due to c/o fatigue.  In room gagging on mucous and pt vomited.  Assisted back to bed S and cues to high bed to simulate height of bed at home per daughter.  Patient left sitting EOB with daughter present and RN informed that pt vomited.   Therapy Documentation Precautions:  Precautions Precautions: Fall Required Braces or Orthoses: Other Brace/Splint Other Brace/Splint: Short leg cast Restrictions Weight Bearing Restrictions: Yes LLE Weight Bearing: Non weight bearing Pain: Pain Assessment Pain Assessment: No/denies pain     See Function Navigator for Current Functional Status.   Therapy/Group: Individual Therapy  [name] 02/15/2017, 4:38 PM

## 2017-02-15 NOTE — Plan of Care (Signed)
Pt has achieved 8/9 LTGs 02/15/17

## 2017-02-15 NOTE — Discharge Summary (Signed)
Occupational Therapy Discharge Summary  Patient Details  Name: [name] MRN: [id number] Date of Birth: Nov 05, 1950  Today's Date: 02/15/2017 OT Individual Time: 0347-4259 OT Individual Time Calculation (min): 70 min  Patient has met 8 of 9 long term goals due to improved activity tolerance, improved balance, postural control, ability to compensate for deficits, improved awareness and improved coordination.  Patient to discharge at overall Supervision level.  Patient's care partner daughter Beckie Busing has completed family training to provide the necessary cognitive assistance at discharge.    Pt still requires cues for L LE NWB during dynamic sitting tasks and therefore did not reach Mod I level for this goal  Recommendation:  Patient will benefit from ongoing skilled OT services in home health setting to continue to advance functional skills in the area of BADL and iADL. Equipment: Drop arm commode  Reasons for discharge: treatment goals met  Patient/family agrees with progress made and goals achieved: Yes   Skilled Therapeutic Intervention:  Pt greeted EOB, requesting to use the bathroom. Pt hopping short distance to bathroom with RW and supervision with min cues for Lt NWB. After toileting, pt completed bathing/dressing tasks EOB. Supervision bathing while pt completed lateral leans for pericare and used LH sponge for reaching Rt foot. Reacher training with donning LB garments and pt able to don Rt gripper sock with use of adaptive footstool. Per pt, he already has reacher and footstool at home for use. Pt then completed stand pivot<w/c and then completed oral care/grooming tasks w/c level at sink with Mod I. He self propelled with bilateral UEs into hallway and completed simple meal prep task near RN station. Cues provided for w/c safety and sequencing while he completed this seated. Also discussed laundry setup at home, with pt verbalizing his daughter will take on this role at d/c. Educated  pt on overall progress, DME recommendations, and OT goal achievement. Pt verbalized understanding. He was left in w/c with all needs within reach at session exit.     OT Discharge Precautions/Restrictions  Precautions Precautions: Fall Required Braces or Orthoses: Other Brace/Splint Other Brace/Splint: Short leg cast Restrictions Weight Bearing Restrictions: Yes LLE Weight Bearing: Non weight bearing Vital Signs Therapy Vitals Temp: 98.4 F (36.9 C) Temp Source: Oral Pulse Rate: 87 Resp: 18 BP: (!) 158/88 Patient Position (if appropriate): Lying Oxygen Therapy SpO2: 99 % O2 Device: Not Delivered Pain Pain Assessment Pain Assessment: No/denies pain ADL ADL ADL Comments: Please see functional navigator for ADL status Vision Baseline Vision/History: Wears glasses Wears Glasses: Reading only Patient Visual Report: No change from baseline Perception  Perception: Within Functional Limits Praxis Praxis: Intact Cognition Overall Cognitive Status: History of cognitive impairments - at baseline Arousal/Alertness: Awake/alert Orientation Level: Oriented X4 Attention: Sustained Sustained Attention: Impaired Memory: Impaired Awareness: Impaired Problem Solving: Impaired Safety/Judgment: Impaired Sensation Sensation Light Touch: Appears Intact Stereognosis: Not tested Hot/Cold: Not tested Proprioception: Appears Intact Coordination Gross Motor Movements are Fluid and Coordinated: Yes Fine Motor Movements are Fluid and Coordinated: Yes Coordination and Movement Description: WFL for ADL tasks Motor  Motor Motor: Within Functional Limits Motor - Discharge Observations: generalized weakness Mobility  Transfers Transfers: Sit to Stand;Stand to Sit Sit to Stand: 5: Supervision  From toilet Sit to Stand Details: Verbal cues for precautions/safety Stand to Sit: 5: Supervision To toilet Stand to Sit Details (indicate cue type and reason): Verbal cues for technique   Trunk/Postural Assessment  Cervical Assessment Cervical Assessment: Within Functional Limits Thoracic Assessment Thoracic Assessment: Within Functional Limits Lumbar  Assessment Lumbar Assessment: Within Functional Limits  Balance Balance Balance Assessed: Yes Dynamic Sitting Balance Dynamic Sitting - Balance Support: During functional activity(LB bathing/dressing EOB) Dynamic Sitting - Level of Assistance: 5: Stand by assistance Dynamic Standing Balance Dynamic Standing - Balance Support: Left upper extremity supported(Clothing mgt post toileting) Dynamic Standing - Level of Assistance: 5: Stand by assistance Dynamic Standing - Balance Activities: Forward lean/weight shifting;Lateral lean/weight shifting  Extremity/Trunk Assessment RUE Assessment RUE Assessment: Within Functional Limits LUE Assessment LUE Assessment: Exceptions to WFL(trigger finger Lt middle finger, to be further assessed in f/u therapies)   See Function Navigator for Current Functional Status.   A  02/15/2017, 4:21 PM

## 2017-02-15 NOTE — Progress Notes (Signed)
Orthopedic Tech Progress Note Patient Details:  [name] Oct 09, 1950 [id number]  Patient ID: [name], male   DOB: 1950-12-11, 67 y.o.   MRN: [id number]   Marilu Favre Hanger for left resting hand splint. 02/15/2017, 7:44 AM

## 2017-02-15 NOTE — Progress Notes (Signed)
Speech Language Pathology Discharge Summary  Patient Details  Name: [name] MRN: [id number] Date of Birth: 12-Jul-1950  Today's Date: 02/15/2017 SLP Individual Time: 87-1415 SLP Individual Time Calculation (min): 30 min   Skilled Therapeutic Interventions:  Skilled treatment session focused on completion of caregiver education with pt and his daughter. Pt appeared largely disinterested in information and daughter stated that she would be sleeping during the day with him. Information given about renal diet, monitoring blood sugar and being engaged in daily activities. Pt stated "what fairy tale are you living in?" Daughter stated that she had lived with him all of her life and was use to him.     Patient has met 4 of 4 long term goals.  Patient to discharge at overall Supervision level.   Clinical Impression/Discharge Summary:   Pt would benefit from follow up [place] although he will likely refuse them. HHST would be helpful to develop cognitive strategies for medical management within home environment.   Care Partner:  Caregiver Able to Provide Assistance: Yes  Type of Caregiver Assistance: Physical;Cognitive  Recommendation:  24 hour supervision/assistance;Home Health SLP  Rationale for SLP Follow Up: Maximize cognitive function and independence;Reduce caregiver burden   Equipment:     Reasons for discharge: Discharged from hospital;Treatment goals met   Patient/Family Agrees with Progress Made and Goals Achieved: Yes   Function:    Cognition Comprehension Comprehension assist level: Follows basic conversation/direction with no assist  Expression   Expression assist level: Expresses basic needs/ideas: With no assist  Social Interaction Social Interaction assist level: Interacts appropriately 90% of the time - Needs monitoring or encouragement for participation or interaction.  Problem Solving Problem solving assist level: Solves basic problems with no assist  Memory Memory  assist level: Recognizes or recalls 90% of the time/requires cueing < 10% of the time   Wandra Babin 02/15/2017, 3:50 PM

## 2017-02-15 NOTE — Progress Notes (Signed)
. [facility] PHYSICAL MEDICINE & REHABILITATION     PROGRESS NOTE  Subjective/Complaints:  No new complaints today.  Was able to sleep fairly well except for the lights on the bed which distracted him.  ROS: pt denies nausea, vomiting, diarrhea, cough, shortness of breath or chest pain   Objective: Vital Signs: Blood pressure (!) 156/72, pulse 83, temperature 98.4 F (36.9 C), temperature source Oral, resp. rate 18, height 5\' 9"  (1.753 m), weight 97.8 kg (215 lb 9.8 oz), SpO2 97 %. No results found. Recent Labs    02/12/17 1850  WBC 7.5  HGB 9.5*  HCT 29.4*  PLT 296   Recent Labs    02/12/17 1849  NA 131*  K 4.3  CL 94*  GLUCOSE 113*  BUN 30*  CREATININE 9.38*  CALCIUM 10.4*   CBG (last 3)  Recent Labs    02/14/17 1630 02/14/17 2053 02/15/17 0635  GLUCAP 120* 95 105*    Wt Readings from Last 3 Encounters:  02/12/17 97.8 kg (215 lb 9.8 oz)  02/03/17 88.7 kg (195 lb 8.8 oz)  01/19/17 95.7 kg (211 lb)    Physical Exam:  BP (!) 156/72 (BP Location: Left Arm)   Pulse 83   Temp 98.4 F (36.9 C) (Oral)   Resp 18   Ht 5\' 9"  (1.753 m)   Wt 97.8 kg (215 lb 9.8 oz)   SpO2 97%   BMI 31.84 kg/m  Constitutional: He appears well-developed. Obese  HENT: Normocephalic and atraumatic.  Small abrasion over the posterior left temporoparietal area. Eyes: EOM are normal. No discharge.  Cardiovascular: RRR without murmur. No JVD  Respiratory: CTA Bilaterally without wheezes or rales. Normal effort   GI: Soft. Bowel sounds are normal.  Musculoskeletal: No edema.    Trigger finger left middle finger Neurological: He is alert and oriented.  Motor: Bilateral upper extremities 5/5 proximal distal Left lower extremity: 5/5 proximal distal (stable) Right lower extremity: 4+-5/5 proximally Skin: Distal LLE with cast Psychiatric: His affect is blunt. He is slowed.    Assessment/Plan: 1. Functional deficits secondary to metabolic encephalopathy which require 3+ hours per day  of interdisciplinary therapy in a comprehensive inpatient rehab setting. Physiatrist is providing close team supervision and 24 hour management of active medical problems listed below. Physiatrist and rehab team continue to assess barriers to discharge/monitor patient progress toward functional and medical goals.  Function:  Bathing Bathing position   Position: Wheelchair/chair at sink  Bathing parts Body parts bathed by patient: Right arm, Left arm, Chest, Abdomen, Front perineal area, Buttocks, Right upper leg, Left upper leg, Right lower leg Body parts bathed by helper: Right lower leg, Back  Bathing assist Assist Level: Touching or steadying assistance(Pt > 75%)      Upper Body Dressing/Undressing Upper body dressing   What is the patient wearing?: Hospital gown     Pull over shirt/dress - Perfomed by patient: Thread/unthread right sleeve, Thread/unthread left sleeve, Put head through opening, Pull shirt over trunk          Upper body assist Assist Level: Set up   Set up : To obtain clothing/put away  Lower Body Dressing/Undressing Lower body dressing   What is the patient wearing?: Pants, Non-skid slipper socks Underwear - Performed by patient: Thread/unthread right underwear leg, Pull underwear up/down Underwear - Performed by helper: Thread/unthread left underwear leg Pants- Performed by patient: Pull pants up/down, Thread/unthread right pants leg Pants- Performed by helper: Thread/unthread left pants leg Non-skid slipper socks- Performed  by patient: Don/doff right sock Non-skid slipper socks- Performed by helper: Don/doff right sock, Don/doff left sock           AFO - Performed by helper: Don/doff left AFO(AFO=CAM boot)      Lower body assist Assist for lower body dressing: Touching or steadying assistance (Pt > 75%)      Toileting Toileting   Toileting steps completed by patient: Adjust clothing prior to toileting, Performs perineal hygiene, Adjust clothing  after toileting Toileting steps completed by helper: Performs perineal hygiene, Adjust clothing prior to toileting, Adjust clothing after toileting Toileting Assistive Devices: Grab bar or rail  Toileting assist Assist level: Touching or steadying assistance (Pt.75%)   Transfers Chair/bed transfer Chair/bed transfer activity did not occur: Refused Chair/bed transfer method: Ambulatory Chair/bed transfer assist level: Touching or steadying assistance (Pt > 75%) Chair/bed transfer assistive device: Armrests, Medical sales representative Ambulation activity did not occur: Refused   Max distance: 40 Assist level: Touching or steadying assistance (Pt > 75%)   Wheelchair   Type: Manual Max wheelchair distance: 200' Assist Level: Supervision or verbal cues  Cognition Comprehension Comprehension assist level: Follows basic conversation/direction with no assist  Expression Expression assist level: Expresses basic needs/ideas: With no assist  Social Interaction Social Interaction assist level: Interacts appropriately 90% of the time - Needs monitoring or encouragement for participation or interaction.  Problem Solving Problem solving assist level: Solves basic problems with no assist  Memory Memory assist level: Recognizes or recalls 90% of the time/requires cueing < 10% of the time    Medical Problem List and Plan: 1.  Decreased functional mobility secondary to metabolic encephalopathy.   Cont CIR therapies.  PT OT speech   -Discharge home on 02/16/2017 2.  DVT Prophylaxis/Anticoagulation: Subcutaneous heparin. Monitor for any bleeding episodes 3. Pain Management: Tylenol as needed 4. Mood: Provide emotional support 5. Neuropsych: This patient is not fully capable of making decisions on his own behalf. 6. Skin/Wound Care: Routine skin checks.  Recommended routine hair and scalp hygiene 7. Fluids/Electrolytes/Nutrition: Routine I&O's 8. End-stage renal disease. Continue hemodialysis  as per renal services  9. Hypertension. Toprol 12.5 mg Tuesday Thursday Saturday and Sunday, Norvasc 10 mg daily   Hydralazine 10 TID started on 12/28, increased to 25 on 12/31 with improved control at present Vitals:   02/14/17 2143 02/15/17 0411  BP: (!) 143/71 (!) 156/72  Pulse: 79 83  Resp:  18  Temp:  98.4 F (36.9 C)  SpO2:  97%   10. Diabetes mellitus.SSI. Check blood sugars before meals and at bedtime CBG (last 3)  Recent Labs    02/14/17 1630 02/14/17 2053 02/15/17 0635  GLUCAP 120* 95 105*   controlled on 1/6 11. Closed Left distal fibular fracture/nondisplaced. Nonweightbearing. Patient will need follow-up with orthopedic services. 12. Hyperlipidemia. Lipitor 13. Leukocytosis: Resolved      14 . Anemia of chronic disease   Hemoglobin 9.3 on 12/31   Labs with HD   Continue to monitor 15. Obesity   Body mass index is 31.84 kg/m.   Diet and exercise education   Will cont to encourage weight loss to increase endurance and promote overall health 16. Left middle trigger finger:  range of motion and strengthening exercises for her left hand.  He can apply heat as well if needed, resting hand splint to be used at night   LOS (Days) 12 A FACE TO FACE EVALUATION WAS PERFORMED  Chidera Dearcos T 02/15/2017 9:48 AM   '

## 2017-02-15 NOTE — Discharge Summary (Signed)
NAME:  [name], [name]                        ACCOUNT NO.:  MEDICAL RECORD NO.:  [id number]  LOCATION:                                 FACILITY:  PHYSICIAN:  Meredith Staggers, M.D.DATE OF BIRTH:  [birth year]  DATE OF ADMISSION:  02/03/2017 DATE OF DISCHARGE:  02/16/2017                              DISCHARGE SUMMARY   DISCHARGE DIAGNOSES: 1. Metabolic encephalopathy. 2. Subcutaneous heparin for deep venous thrombosis prophylaxis. 3. End-stage renal disease. 4. Hypertension. 5. Diabetes mellitus. 6. Peripheral neuropathy. 7. Closed left distal fibular fracture, nondisplaced. 8. Hyperlipidemia. 9. Leukocytosis, resolved. 10.Anemia of chronic disease. 11.Left middle trigger finger. 12.Constipation  HISTORY OF PRESENT ILLNESS:  This is a 67 year old right-handed male with history of end-stage renal disease, hypertension, diabetes mellitus, tobacco abuse, and recent left fibular fracture, who presented on January 22, 4495 with metabolic encephalopathy.  Per chart review, he lives alone and independent prior to admission in one-level home.  In regard to recent left fibular fracture after a fall, he was casted, and discharged to home with Tylenol.  The next day, he presented with his dialysis session.  Daughter went to check on him and found him unresponsive.  He was brought to the ED and noted to be tachycardic, tachypneic, and hypertensive.  Ammonia level was within normal limits. CT and MRI of the head performed was unremarkable for acute process. Neurology was consulted for altered mental status.  MRI was ordered due to concern for posterior reversible encephalopathy syndrome, but patient was not able to lie still initially.  Concern for sepsis and placed on broad-spectrum antibiotics, but had since been discontinued. Subcutaneous heparin for DVT prophylaxis.  Hemodialysis is ongoing. Tolerating a regular diet.  He remained non-weightbearing to left lower extremity.  Physical and  occupational therapy were completed. The patient was admitted for a comprehensive rehabilitation program.  PAST MEDICAL HISTORY:  See discharge diagnoses.  SOCIAL HISTORY:  He lives alone.  He has a daughter in the area.  Functional status upon admission to [place], minimal assist 30 feet with rolling walker, minimal assist with stand pivot transfers, and min-to-mod assist with activities of daily living.  PHYSICAL EXAMINATION:  VITAL SIGNS:  Blood pressure is 158/79, pulse is 105, temperature is 99, and respirations are 24. GENERAL:  An alert male, in no acute distress.  EOMs are intact. NECK:  Supple and nontender.  No jugular venous distention. CARDIAC:  Rate controlled. GASTROINTESTINAL:  Abdomen is soft and nontender.  Good bowel sounds. PULMONARY:  Lungs are clear to auscultation. EXTREMITIES:  Distal left lower extremity with cast in place. PSYCHIATRIC:  Mood was flat, but appropriate.  REHABILITATION HOSPITAL COURSE:  The patient was admitted to [facility] with therapies initiated on a 3-hour daily basis consisting of physical therapy, occupational therapy, speech therapy, and rehabilitation nursing.  The following issues were addressed during the patient's rehabilitation stay.  Pertaining to [name] metabolic encephalopathy, he continued to progress nicely, tolerating full therapies.  Subcutaneous heparin for DVT prophylaxis.  No bleeding episodes.  Hemodialysis was ongoing as per Renal Services.  Blood pressures were controlled on hydralazine as well as Toprol  with low-dose Norvasc.  Blood sugars were controlled with full diabetic teaching.  In regard to close distal fibular fracture, nondisplaced and nonweightbearing, he would follow up with Orthopedic Services. Neurovascular sensation is intact.  Anemia of chronic disease, latest hemoglobin was 9.3.  He remained on Aranesp.  Obesity with body mass index of 31.84 kg.  Diet and exercise and  education were provided as the patient continued to attend therapies.  For left middle trigger finger, I recommended range of motion and strengthening exercises and applied heat.  Order of resting hand splint to be used at night. Patient bowel constipation and regimen adjusted however he was refusing laxatives denying any nausea or vomiting.  The patient received weekly collaborative interdisciplinary team conferences to discuss estimated length of stay, family teaching, and any barriers to his discharge.  Supervision wheelchair mobility using bilateral upper extremities, worked on functional mobility in the rehab gym while ambulating in a controlled home-like environment at 40 feet, minimal assist, and maintaining weightbearing precautions with activities of daily living and homemaking.  Again, working with nonweightbearing of left lower extremity, required max cues at times to maintain.  Improved requiring moderate assist for bathing and max assist for dressing.  He continued to demonstrate some muscle weakness working with energy conservation techniques as well as safety awareness.  Full family teaching was completed and plan was discharge to home.  DISCHARGE MEDICATIONS:  Included 1. Norvasc 10 mg p.o. daily. 2. Lipitor 20 mg p.o. daily. 3. PhosLo 1334 mg p.o. t.i.d. 4. Sensipar 120 mg p.o. on Monday and Wednesday and Friday. 5. Folic acid 1 mg p.o. daily. 6. Hydralazine 25 mg every eight hours. 7. Toprol XL 12.5 mg p.o. on Tuesday, Thursday, Saturday, and Sunday. 8. Protonix 40 mg p.o. daily. 9. Flomax 0.4 mg p.o. daily. 10.Tylenol as needed.  DISCHARGE DIET:  His diet was a diabetic renal diet.  DISCHARGE FOLLOWUP:  The patient would follow up with Dr. Alger Simons at the outpatient rehab service office as directed; Dr. Katha Hamming of Orthopedic Service, call for appointment; Dr. Jeneen Rinks Deterding of Renal Services; and follow up with the [facility]:  The patient will continue to be non-weightbearing of left lower extremity.     Lauraine Rinne, P.A.   ______________________________ Meredith Staggers, M.D.    DA/MEDQ  D:  02/15/2017  T:  02/15/2017  Job:  [id number]  cc:   Jeneen Rinks L. Deterding, M.D. [facility] Meredith Staggers, M.D. Lennette Bihari Dr. Doreatha Martin

## 2017-02-15 NOTE — Discharge Instructions (Signed)
Inpatient Rehab Discharge Instructions  [name] Discharge date and time: No discharge date for patient encounter.   Activities/Precautions/ Functional Status: Activity: Nonweightbearing left lower extremity Diet: renal diet Wound Care: none needed Functional status:  ___ No restrictions     ___ Walk up steps independently ___ 24/7 supervision/assistance   ___ Walk up steps with assistance ___ Intermittent supervision/assistance  ___ Bathe/dress independently ___ Walk with walker     _x__ Bathe/dress with assistance ___ Walk Independently    ___ Shower independently ___ Walk with assistance    ___ Shower with assistance ___ No alcohol     ___ Return to work/school ________    COMMUNITY REFERRALS UPON DISCHARGE:    Home Health:   PT     OT     ST                        Agency:  [facility] Phone: [phone]   Medical Equipment/Items Ordered:  Wheelchair, cushion, walker and commode                                                      Agency/Supplier:  [employer] @ [phone]      Special Instructions:  Continue hemodialysis as directed  My questions have been answered and I understand these instructions. I will adhere to these goals and the provided educational materials after my discharge from the hospital.  Patient/Caregiver Signature _______________________________ Date __________  Clinician Signature _______________________________________ Date __________  Please bring this form and your medication list with you to all your follow-up doctor's appointments.

## 2017-02-15 NOTE — Discharge Summary (Signed)
Discharge summary job [id number]

## 2017-02-15 NOTE — Progress Notes (Signed)
Physical Therapy Discharge Summary  Patient Details  Name: [name] MRN: [id number] Date of Birth: [birth year]  Today's Date: 02/15/2017 PT Individual Time: 1415-1510 PT Individual Time Calculation (min): 55 min    Patient has met 9 of 11 long term goals due to improved activity tolerance, improved balance and increased strength.  Patient to discharge at a wheelchair level Supervision.   Patient's care partner is independent to provide the necessary physical and cognitive assistance at discharge.  Reasons goals not met: Patient fatigued prior to achieving 50' of ambulation.  After 39' becomes unsafe and at risk for falls. Daughter aware and able to ensure safety at home  Recommendation:  Patient will benefit from ongoing skilled PT services in home health setting to continue to advance safe functional mobility, address ongoing impairments in gait endurance, balance, safety, and minimize fall risk.  Equipment: Wheelchair with elevating leg rests, rolling walker.   Reasons for discharge: discharge from hospital  Patient/family agrees with progress made and goals achieved: Yes  PT Discharge Precautions/Restrictions Precautions Precautions: Fall Required Braces or Orthoses: Other Brace/Splint Other Brace/Splint: Short leg cast Restrictions Weight Bearing Restrictions: Yes LLE Weight Bearing: Non weight bearing Vital Signs Therapy Vitals Temp: 98.4 F (36.9 C) Temp Source: Oral Pulse Rate: 87 Resp: 18 BP: (!) 158/88 Patient Position (if appropriate): Lying Oxygen Therapy SpO2: 99 % O2 Device: Not Delivered Pain Pain Assessment Pain Assessment: No/denies pain Vision/Perception  Perception Perception: Within Functional Limits Praxis Praxis: Intact  Cognition Overall Cognitive Status: History of cognitive impairments - at baseline Arousal/Alertness: Awake/alert Orientation Level: Oriented X4 Attention: Sustained Sustained Attention: Impaired Sustained Attention  Impairment: Verbal basic;Functional basic Memory: Impaired Memory Impairment: Storage deficit;Decreased recall of new information Awareness: Impaired Awareness Impairment: Intellectual impairment Problem Solving: Impaired Problem Solving Impairment: Functional basic Safety/Judgment: Impaired Sensation Sensation Light Touch: Appears Intact Stereognosis: Not tested Hot/Cold: Not tested Proprioception: Appears Intact Coordination Gross Motor Movements are Fluid and Coordinated: Yes Fine Motor Movements are Fluid and Coordinated: Yes Coordination and Movement Description: WFL for ADL tasks Motor  Motor Motor: Within Functional Limits Motor - Discharge Observations: generalized weakness  Mobility Bed Mobility Bed Mobility: Supine to Sit;Sit to Supine Supine to Sit: 5: Supervision Sit to Supine: 5: Supervision Sit to Supine - Details (indicate cue type and reason): practiced to simulated bed height of bed at home (high) Transfers Transfers: Yes Sit to Stand: 5: Supervision Sit to Stand Details: Verbal cues for precautions/safety Stand to Sit: 5: Supervision Stand to Sit Details (indicate cue type and reason): Verbal cues for technique Stand Pivot Transfers: 5: Supervision Stand Pivot Transfer Details: Verbal cues for technique Squat Pivot Transfer Details (indicate cue type and reason): cues for NWB Locomotion  Ambulation Ambulation: Yes Ambulation/Gait Assistance: 5: Supervision Ambulation Distance (Feet): 40 Feet Assistive device: Rolling walker Ambulation/Gait Assistance Details: Verbal cues for technique;Verbal cues for precautions/safety Ambulation/Gait Assistance Details: cues for walker still while hopping forward due to fatigue and rolling walker while hopping Stairs / Additional Locomotion Stairs: Yes Stairs Assistance: 4: Min assist Stairs Assistance Details: Verbal cues for technique;Verbal cues for safe use of DME/AE;Verbal cues for precautions/safety Stairs  Assistance Details (indicate cue type and reason): cues for sequence, and weight bearing Stair Management Technique: With walker Number of Stairs: 2 Height of Stairs: 3 Wheelchair Mobility Wheelchair Mobility: Yes Wheelchair Assistance: 5: Careers information officer: Both upper extremities Wheelchair Parts Management: Supervision/cueing Distance: 200  Trunk/Postural Assessment  Cervical Assessment Cervical Assessment: Within Water engineer Thoracic Assessment: Within  Functional Limits Lumbar Assessment Lumbar Assessment: Within Functional Limits  Balance Balance Balance Assessed: Yes Static Sitting Balance Static Sitting - Level of Assistance: 5: Stand by assistance Dynamic Sitting Balance Dynamic Sitting - Balance Support: During functional activity(LB bathing/dressing EOB) Dynamic Sitting - Level of Assistance: 5: Stand by assistance Dynamic Standing Balance Dynamic Standing - Balance Support: Left upper extremity supported Dynamic Standing - Level of Assistance: 5: Stand by assistance Dynamic Standing - Balance Activities: Forward lean/weight shifting;Lateral lean/weight shifting Extremity Assessment  RUE Assessment RUE Assessment: Within Functional Limits LUE Assessment LUE Assessment: Exceptions to WFL(trigger finger Lt middle finger, to be further assessed in f/u therapies) RLE Assessment RLE Assessment: Within Functional Limits LLE Assessment LLE Assessment: Exceptions to [facility] LLE Strength LLE Overall Strength Comments: hip flex 3+/5, knee ext 4-/5, ankle in cast   See Function Navigator for Current Functional Status.  [name] 02/15/2017, 4:28 PM

## 2017-02-15 NOTE — Progress Notes (Signed)
  [facility] Progress Note   Subjective:  No c/o  Objective Vitals:   02/14/17 0547 02/14/17 1340 02/14/17 2143 02/15/17 0411  BP: (!) 148/73 (!) 140/51 (!) 143/71 (!) 156/72  Pulse: 91 82 79 83  Resp: 18 16  18   Temp: (!) 97.5 F (36.4 C) 98.9 F (37.2 C)  98.4 F (36.9 C)  TempSrc: Oral Oral  Oral  SpO2: 96% 97%  97%  Weight:      Height:       Physical Exam General: Well appearing man, NAD. Heart: RRR; no murmur Lungs: CTAB Extremities: No LE edema, L leg in cast Dialysis Access: R AVF + thrill  Additional Objective Labs: Basic Metabolic Panel: Recent Labs  Lab 02/10/17 1750 02/12/17 1849  NA 132* 131*  K 4.7 4.3  CL 94* 94*  CO2 27 27  GLUCOSE 123* 113*  BUN 37* 30*  CREATININE 9.86* 9.38*  CALCIUM 10.0 10.4*  PHOS 4.7* 4.4   Liver Function Tests: Recent Labs  Lab 02/10/17 1750 02/12/17 1849  ALBUMIN 3.5 3.6   CBC: Recent Labs  Lab 02/10/17 1751 02/12/17 1850  WBC 8.2 7.5  HGB 9.9* 9.5*  HCT 29.8* 29.4*  MCV 103.5* 105.0*  PLT 353 296   Blood Culture    Component Value Date/Time   SDES URINE, CLEAN CATCH 02/08/2017 1311   SPECREQUEST NONE 02/08/2017 1311   CULT <10,000 COLONIES/mL INSIGNIFICANT GROWTH (A) 02/08/2017 1311   REPTSTATUS 02/09/2017 FINAL 02/08/2017 1311   Medications:  . amLODipine  10 mg Oral Daily  . atorvastatin  20 mg Oral q1800  . calcium acetate  1,334 mg Oral TID WC  . cinacalcet  120 mg Oral Q M,W,F  . darbepoetin (ARANESP) injection - DIALYSIS  60 mcg Intravenous Q Fri-HD  . folic acid  1 mg Oral Daily  . heparin  5,000 Units Subcutaneous Q8H  . hydrALAZINE  25 mg Oral Q8H  . insulin aspart  0-15 Units Subcutaneous TID AC & HS  . metoprolol succinate  12.5 mg Oral Q T,Th,S,Su  . pantoprazole  40 mg Oral Daily  . tamsulosin  0.4 mg Oral QPC supper   Dialysis Orders: MWF [facility] 4 hr 15 min 180 NRe 400/800 95.5 kg(new edw ~ 88kg)2.0 K/2.5 Ca  -Heparin 8600 units IV TIW -Calcitriol 2.0 mcg  PO TIW -Sensipar 120 mg PO TIW -Venofer 50 mg IV weekly (Last dose 01/13/17) -Mircera 50 mcg IV q 2 weeks (last dose 12/27/16)  Assessment/Plan: 1. AMS - resolved 2. L distal fibula fracture- s/p short leg cast and non-weight bearing PT 3. ESRDMWF HD.  HD today 4. Hypertension/Volume: Lower EDW at discharge 5. Anemia: Hgb 9.5  continue weekly Aranesp 6. BMD: Phos ok, Cor Ca^^. Hold Calcitriol for now and follow. On PhosLo binder/Sensipar  7. Disposition- currently in CIR. For dc tomorrow.     Kelly Splinter MD [employer] pager [phone]   02/15/2017, 1:24 PM

## 2017-02-16 LAB — GLUCOSE, CAPILLARY: GLUCOSE-CAPILLARY: 94 mg/dL (ref 65–99)

## 2017-02-16 MED ORDER — CALCIUM ACETATE (PHOS BINDER) 667 MG PO CAPS: 1334.0000 mg | ORAL_CAPSULE | Freq: Three times a day (TID) | ORAL | 0 refills | Status: DC

## 2017-02-16 MED ORDER — TAMSULOSIN HCL 0.4 MG PO CAPS: 0.4000 mg | ORAL_CAPSULE | Freq: Every day | ORAL | 0 refills | Status: AC

## 2017-02-16 MED ORDER — AMLODIPINE BESYLATE 10 MG PO TABS: 10.0000 mg | ORAL_TABLET | Freq: Every day | ORAL | 0 refills | Status: DC

## 2017-02-16 MED ORDER — CINACALCET HCL 30 MG PO TABS: ORAL_TABLET | ORAL | 0 refills | Status: DC

## 2017-02-16 MED ORDER — ATORVASTATIN CALCIUM 20 MG PO TABS: 20.0000 mg | ORAL_TABLET | Freq: Every day | ORAL | 0 refills | Status: DC

## 2017-02-16 MED ORDER — METOPROLOL SUCCINATE ER 25 MG PO TB24: 12.5000 mg | ORAL_TABLET | ORAL | 0 refills | Status: DC

## 2017-02-16 MED ORDER — FOLIC ACID 1 MG PO TABS: 1.0000 mg | ORAL_TABLET | Freq: Every day | ORAL | 0 refills | Status: DC

## 2017-02-16 MED ORDER — PANTOPRAZOLE SODIUM 40 MG PO TBEC: 40.0000 mg | DELAYED_RELEASE_TABLET | Freq: Every day | ORAL | 0 refills | Status: DC

## 2017-02-16 MED ORDER — HYDRALAZINE HCL 25 MG PO TABS: 25.0000 mg | ORAL_TABLET | Freq: Three times a day (TID) | ORAL | 0 refills | Status: DC

## 2017-02-16 NOTE — Progress Notes (Signed)
Social Work  Discharge Note  The overall goal for the admission was met for:   Discharge location: Yes - home with daughter, Beckie Busing, to stay and provide 24/7 assistance  Length of Stay: Yes - 15 days  Discharge activity level: Yes - supervision overall  Home/community participation: Yes  Services provided included: MD, RD, PT, OT, SLP, RN, TR, Pharmacy and [place]: Medicare and Private Insurance: [place]  Follow-up services arranged: Home Health: PT, OT, ST via [facility], DME: 18x18 lightweight w/c with ELRs, cushion, rolling walker and drop arm commode via [place] and Patient/Family has no preference for HH/DME agencies  Comments (or additional information):  Patient/Family verbalized understanding of follow-up arrangements: Yes  Individual responsible for coordination of the follow-up plan: pt/daughter  Confirmed correct DME delivered: Lennart Pall 02/16/2017    Ulric Salzman

## 2017-02-16 NOTE — Progress Notes (Signed)
. [place] PHYSICAL MEDICINE & REHABILITATION     PROGRESS NOTE  Subjective/Complaints:  Hasn't moved bowels for some time. Wants to use remedy at home which is reliable for him  ROS: pt denies nausea, vomiting, diarrhea, cough, shortness of breath or chest pain    Objective: Vital Signs: Blood pressure 113/61, pulse 86, temperature 98.4 F (36.9 C), temperature source Oral, resp. rate 18, height 5\' 9"  (1.753 m), weight 84.4 kg (186 lb 1.1 oz), SpO2 99 %. No results found. Recent Labs    02/15/17 1856  WBC 7.6  HGB 10.0*  HCT 30.5*  PLT 253   Recent Labs    02/15/17 1856  NA 130*  K 4.5  CL 93*  GLUCOSE 83  BUN 36*  CREATININE 10.22*  CALCIUM 10.6*   CBG (last 3)  Recent Labs    02/15/17 1640 02/15/17 2323 02/16/17 0628  GLUCAP 80 114* 94    Wt Readings from Last 3 Encounters:  02/15/17 84.4 kg (186 lb 1.1 oz)  02/03/17 88.7 kg (195 lb 8.8 oz)  01/19/17 95.7 kg (211 lb)    Physical Exam:  BP 113/61   Pulse 86   Temp 98.4 F (36.9 C) (Oral)   Resp 18   Ht 5\' 9"  (1.753 m)   Wt 84.4 kg (186 lb 1.1 oz)   SpO2 99%   BMI 27.48 kg/m  Constitutional: He appears well-developed. Obese  HENT: Normocephalic and atraumatic.  Small abrasion over the posterior left temporoparietal area. Eyes: EOM are normal. No discharge.  Cardiovascular: RRR without murmur. No JVD   Respiratory: CTA Bilaterally without wheezes or rales. Normal effort   GI: Soft. Bowel sounds are normal.  Musculoskeletal: No edema.    Trigger finger left middle finger Neurological: He is alert and oriented.  Motor: Bilateral upper extremities 5/5 proximal distal Left lower extremity: 5/5 proximal distal (stable) Right lower extremity: 4+-5/5 proximally Skin: Distal LLE with cast Psychiatric: His affect is blunt. He is slowed.    Assessment/Plan: 1. Functional deficits secondary to metabolic encephalopathy which require 3+ hours per day of interdisciplinary therapy in a comprehensive  inpatient rehab setting. Physiatrist is providing close team supervision and 24 hour management of active medical problems listed below. Physiatrist and rehab team continue to assess barriers to discharge/monitor patient progress toward functional and medical goals.  Function:  Bathing Bathing position   Position: Sitting EOB  Bathing parts Body parts bathed by patient: Right arm, Left arm, Chest, Abdomen, Front perineal area, Buttocks, Right upper leg, Left upper leg, Right lower leg, Back Body parts bathed by helper: Right lower leg, Back  Bathing assist Assist Level: Supervision or verbal cues      Upper Body Dressing/Undressing Upper body dressing   What is the patient wearing?: Pull over shirt/dress     Pull over shirt/dress - Perfomed by patient: Thread/unthread right sleeve, Thread/unthread left sleeve, Put head through opening, Pull shirt over trunk          Upper body assist Assist Level: Set up   Set up : To obtain clothing/put away  Lower Body Dressing/Undressing Lower body dressing   What is the patient wearing?: Pants, Non-skid slipper socks Underwear - Performed by patient: Thread/unthread right underwear leg, Pull underwear up/down, Thread/unthread left underwear leg Underwear - Performed by helper: Thread/unthread left underwear leg Pants- Performed by patient: Pull pants up/down, Thread/unthread right pants leg, Thread/unthread left pants leg Pants- Performed by helper: Thread/unthread left pants leg Non-skid slipper socks- Performed by  patient: Don/doff right sock Non-skid slipper socks- Performed by helper: Don/doff right sock, Don/doff left sock           AFO - Performed by helper: Don/doff left AFO(AFO=CAM boot)      Lower body assist Assist for lower body dressing: Supervision or verbal cues      Toileting Toileting   Toileting steps completed by patient: Performs perineal hygiene, Adjust clothing prior to toileting, Adjust clothing after  toileting Toileting steps completed by helper: Performs perineal hygiene, Adjust clothing prior to toileting, Adjust clothing after toileting Toileting Assistive Devices: Grab bar or rail  Toileting assist Assist level: Supervision or verbal cues   Transfers Chair/bed transfer Chair/bed transfer activity did not occur: Refused Chair/bed transfer method: Ambulatory Chair/bed transfer assist level: Supervision or verbal cues Chair/bed transfer assistive device: Armrests, Medical sales representative Ambulation activity did not occur: Refused   Max distance: 40 Assist level: Supervision or verbal cues   Wheelchair   Type: Manual Max wheelchair distance: 200' Assist Level: Supervision or verbal cues  Cognition Comprehension Comprehension assist level: Understands complex 90% of the time/cues 10% of the time  Expression Expression assist level: Expresses basic needs/ideas: With no assist  Social Interaction Social Interaction assist level: Interacts appropriately 90% of the time - Needs monitoring or encouragement for participation or interaction.  Problem Solving Problem solving assist level: Solves complex 90% of the time/cues < 10% of the time  Memory Memory assist level: More than reasonable amount of time    Medical Problem List and Plan: 1.  Decreased functional mobility secondary to metabolic encephalopathy.   Cont CIR therapies.  PT OT speech    -Discharge home today   -Patient to see Rehab MD in the office for transitional care encounter in 1-2 weeks.    -reiterated the importance of having a bm over next day or two 2.  DVT Prophylaxis/Anticoagulation: Subcutaneous heparin. Monitor for any bleeding episodes 3. Pain Management: Tylenol as needed 4. Mood: Provide emotional support 5. Neuropsych: This patient is not fully capable of making decisions on his own behalf. 6. Skin/Wound Care: Routine skin checks.  Recommended routine hair and scalp hygiene 7.  Fluids/Electrolytes/Nutrition: Routine I&O's 8. End-stage renal disease. Continue hemodialysis as per renal services  9. Hypertension. Toprol 12.5 mg Tuesday Thursday Saturday and Sunday, Norvasc 10 mg daily   Hydralazine 10 TID started on 12/28, increased to 25 on 12/31 with improved control at present Vitals:   02/16/17 0104 02/16/17 0552  BP: 128/63 113/61  Pulse: (!) 102 86  Resp: 18   Temp: 98.4 F (36.9 C)   SpO2: 99%    10. Diabetes mellitus.SSI. Check blood sugars before meals and at bedtime CBG (last 3)  Recent Labs    02/15/17 1640 02/15/17 2323 02/16/17 0628  GLUCAP 80 114* 94   controlled on 1/6 11. Closed Left distal fibular fracture/nondisplaced. Nonweightbearing. Patient will need follow-up with orthopedic services. 12. Hyperlipidemia. Lipitor 13. Leukocytosis: Resolved      14 . Anemia of chronic disease   Hemoglobin 9.3 on 12/31   Labs with HD   Continue to monitor 15. Obesity   Body mass index is 27.48 kg/m.   Diet and exercise education   Will cont to encourage weight loss to increase endurance and promote overall health 16. Left middle trigger finger:  range of motion and strengthening exercises for her left hand.  He can apply heat as well if needed, resting hand splint to be used at  night   LOS (Days) 13 A FACE TO FACE EVALUATION WAS PERFORMED  Forrest Demuro T 02/16/2017 9:02 AM   '

## 2017-02-16 NOTE — Progress Notes (Signed)
Patient discharged to home with daughter. Patient and daughter verbalized understanding of discharge instructions given and reviewed by [name], [place]. Patient continues to require cueing for NWB status on left lower extremity. Instructed patient and daughter to reinforce NWB status to left leg.  [name]

## 2017-02-18 ENCOUNTER — Telehealth: Payer: Self-pay

## 2017-02-18 NOTE — Telephone Encounter (Addendum)
Carol Ada with [facility] called requesting verbal orders for nursing care for patient and PT for 1 time a week for 4 weeks. I spoke with Tharon Aquas and I gave verbal orders per office protocol. She also reports that patients blood pressure was high 200/80 and that he did not go to dialysis because he was constipated. She is requesting a stool softener for the patient to help with the constipation.

## 2017-02-19 ENCOUNTER — Emergency Department (HOSPITAL_COMMUNITY): Payer: Medicare Other

## 2017-02-19 ENCOUNTER — Inpatient Hospital Stay (HOSPITAL_COMMUNITY): Payer: Medicare Other

## 2017-02-19 ENCOUNTER — Encounter (HOSPITAL_COMMUNITY): Payer: Self-pay | Admitting: Emergency Medicine

## 2017-02-19 ENCOUNTER — Inpatient Hospital Stay (HOSPITAL_COMMUNITY)
Admission: EM | Admit: 2017-02-19 | Discharge: 2017-02-26 | DRG: 077 | Disposition: A | Discharge status: Rehabilitation Facility | Payer: Medicare Other | Attending: Nephrology | Admitting: Nephrology

## 2017-02-19 DIAGNOSIS — I120 Hypertensive chronic kidney disease with stage 5 chronic kidney disease or end stage renal disease: Secondary | ICD-10-CM | POA: Diagnosis present

## 2017-02-19 DIAGNOSIS — F05 Delirium due to known physiological condition: Secondary | ICD-10-CM | POA: Diagnosis not present

## 2017-02-19 DIAGNOSIS — I159 Secondary hypertension, unspecified: Secondary | ICD-10-CM

## 2017-02-19 DIAGNOSIS — E8889 Other specified metabolic disorders: Secondary | ICD-10-CM | POA: Diagnosis present

## 2017-02-19 DIAGNOSIS — G9340 Encephalopathy, unspecified: Secondary | ICD-10-CM

## 2017-02-19 DIAGNOSIS — M19041 Primary osteoarthritis, right hand: Secondary | ICD-10-CM | POA: Diagnosis present

## 2017-02-19 DIAGNOSIS — I674 Hypertensive encephalopathy: Principal | ICD-10-CM | POA: Diagnosis present

## 2017-02-19 DIAGNOSIS — N390 Urinary tract infection, site not specified: Secondary | ICD-10-CM

## 2017-02-19 DIAGNOSIS — J9690 Respiratory failure, unspecified, unspecified whether with hypoxia or hypercapnia: Secondary | ICD-10-CM | POA: Diagnosis present

## 2017-02-19 DIAGNOSIS — E871 Hypo-osmolality and hyponatremia: Secondary | ICD-10-CM | POA: Diagnosis present

## 2017-02-19 DIAGNOSIS — Z720 Tobacco use: Secondary | ICD-10-CM

## 2017-02-19 DIAGNOSIS — R011 Cardiac murmur, unspecified: Secondary | ICD-10-CM | POA: Diagnosis present

## 2017-02-19 DIAGNOSIS — B961 Klebsiella pneumoniae [K. pneumoniae] as the cause of diseases classified elsewhere: Secondary | ICD-10-CM | POA: Diagnosis present

## 2017-02-19 DIAGNOSIS — R4182 Altered mental status, unspecified: Secondary | ICD-10-CM | POA: Diagnosis present

## 2017-02-19 DIAGNOSIS — E872 Acidosis: Secondary | ICD-10-CM | POA: Diagnosis present

## 2017-02-19 DIAGNOSIS — R5381 Other malaise: Secondary | ICD-10-CM | POA: Diagnosis present

## 2017-02-19 DIAGNOSIS — Z4659 Encounter for fitting and adjustment of other gastrointestinal appliance and device: Secondary | ICD-10-CM

## 2017-02-19 DIAGNOSIS — E11649 Type 2 diabetes mellitus with hypoglycemia without coma: Secondary | ICD-10-CM | POA: Diagnosis present

## 2017-02-19 DIAGNOSIS — E7800 Pure hypercholesterolemia, unspecified: Secondary | ICD-10-CM | POA: Diagnosis present

## 2017-02-19 DIAGNOSIS — D72829 Elevated white blood cell count, unspecified: Secondary | ICD-10-CM | POA: Diagnosis not present

## 2017-02-19 DIAGNOSIS — J9601 Acute respiratory failure with hypoxia: Secondary | ICD-10-CM | POA: Diagnosis present

## 2017-02-19 DIAGNOSIS — D631 Anemia in chronic kidney disease: Secondary | ICD-10-CM | POA: Diagnosis present

## 2017-02-19 DIAGNOSIS — J449 Chronic obstructive pulmonary disease, unspecified: Secondary | ICD-10-CM | POA: Diagnosis present

## 2017-02-19 DIAGNOSIS — Z23 Encounter for immunization: Secondary | ICD-10-CM

## 2017-02-19 DIAGNOSIS — E1122 Type 2 diabetes mellitus with diabetic chronic kidney disease: Secondary | ICD-10-CM | POA: Diagnosis present

## 2017-02-19 DIAGNOSIS — I161 Hypertensive emergency: Secondary | ICD-10-CM | POA: Diagnosis present

## 2017-02-19 DIAGNOSIS — Z6825 Body mass index (BMI) 25.0-25.9, adult: Secondary | ICD-10-CM

## 2017-02-19 DIAGNOSIS — Z9119 Patient's noncompliance with other medical treatment and regimen: Secondary | ICD-10-CM

## 2017-02-19 DIAGNOSIS — T82898A Other specified complication of vascular prosthetic devices, implants and grafts, initial encounter: Secondary | ICD-10-CM | POA: Diagnosis not present

## 2017-02-19 DIAGNOSIS — E785 Hyperlipidemia, unspecified: Secondary | ICD-10-CM | POA: Diagnosis present

## 2017-02-19 DIAGNOSIS — N2581 Secondary hyperparathyroidism of renal origin: Secondary | ICD-10-CM | POA: Diagnosis present

## 2017-02-19 DIAGNOSIS — K5900 Constipation, unspecified: Secondary | ICD-10-CM | POA: Diagnosis present

## 2017-02-19 DIAGNOSIS — S82822S Torus fracture of lower end of left fibula, sequela: Secondary | ICD-10-CM | POA: Diagnosis not present

## 2017-02-19 DIAGNOSIS — Z992 Dependence on renal dialysis: Secondary | ICD-10-CM | POA: Diagnosis not present

## 2017-02-19 DIAGNOSIS — J810 Acute pulmonary edema: Secondary | ICD-10-CM | POA: Diagnosis present

## 2017-02-19 DIAGNOSIS — R1312 Dysphagia, oropharyngeal phase: Secondary | ICD-10-CM | POA: Diagnosis present

## 2017-02-19 DIAGNOSIS — Z96649 Presence of unspecified artificial hip joint: Secondary | ICD-10-CM | POA: Diagnosis present

## 2017-02-19 DIAGNOSIS — N186 End stage renal disease: Secondary | ICD-10-CM | POA: Diagnosis present

## 2017-02-19 DIAGNOSIS — I10 Essential (primary) hypertension: Secondary | ICD-10-CM | POA: Diagnosis not present

## 2017-02-19 DIAGNOSIS — Z888 Allergy status to other drugs, medicaments and biological substances status: Secondary | ICD-10-CM

## 2017-02-19 DIAGNOSIS — E1165 Type 2 diabetes mellitus with hyperglycemia: Secondary | ICD-10-CM | POA: Diagnosis not present

## 2017-02-19 DIAGNOSIS — Z9289 Personal history of other medical treatment: Secondary | ICD-10-CM

## 2017-02-19 DIAGNOSIS — S82832D Other fracture of upper and lower end of left fibula, subsequent encounter for closed fracture with routine healing: Secondary | ICD-10-CM

## 2017-02-19 DIAGNOSIS — R066 Hiccough: Secondary | ICD-10-CM | POA: Diagnosis not present

## 2017-02-19 DIAGNOSIS — E669 Obesity, unspecified: Secondary | ICD-10-CM | POA: Diagnosis present

## 2017-02-19 DIAGNOSIS — R0902 Hypoxemia: Secondary | ICD-10-CM | POA: Diagnosis not present

## 2017-02-19 DIAGNOSIS — Z9115 Patient's noncompliance with renal dialysis: Secondary | ICD-10-CM

## 2017-02-19 DIAGNOSIS — Z8249 Family history of ischemic heart disease and other diseases of the circulatory system: Secondary | ICD-10-CM

## 2017-02-19 DIAGNOSIS — E1142 Type 2 diabetes mellitus with diabetic polyneuropathy: Secondary | ICD-10-CM | POA: Diagnosis present

## 2017-02-19 DIAGNOSIS — E119 Type 2 diabetes mellitus without complications: Secondary | ICD-10-CM

## 2017-02-19 DIAGNOSIS — K219 Gastro-esophageal reflux disease without esophagitis: Secondary | ICD-10-CM | POA: Diagnosis present

## 2017-02-19 DIAGNOSIS — D638 Anemia in other chronic diseases classified elsewhere: Secondary | ICD-10-CM | POA: Diagnosis not present

## 2017-02-19 DIAGNOSIS — N189 Chronic kidney disease, unspecified: Secondary | ICD-10-CM | POA: Diagnosis not present

## 2017-02-19 DIAGNOSIS — F17210 Nicotine dependence, cigarettes, uncomplicated: Secondary | ICD-10-CM | POA: Diagnosis present

## 2017-02-19 DIAGNOSIS — Z96611 Presence of right artificial shoulder joint: Secondary | ICD-10-CM | POA: Diagnosis present

## 2017-02-19 LAB — I-STAT ARTERIAL BLOOD GAS, ED
Acid-base deficit: 3 mmol/L — ABNORMAL HIGH (ref 0.0–2.0)
Acid-base deficit: 3 mmol/L — ABNORMAL HIGH (ref 0.0–2.0)
Bicarbonate: 25.2 mmol/L (ref 20.0–28.0)
Bicarbonate: 24 mmol/L (ref 20.0–28.0)
O2 SAT: 99 %
O2 Saturation: 100 %
PCO2 ART: 45.4 mmHg (ref 32.0–48.0)
Patient temperature: 98.6
TCO2: 27 mmol/L (ref 22–32)
TCO2: 25 mmol/L (ref 22–32)
pCO2 arterial: 55.3 mmHg — ABNORMAL HIGH (ref 32.0–48.0)
pH, Arterial: 7.267 — ABNORMAL LOW (ref 7.350–7.450)
pH, Arterial: 7.321 — ABNORMAL LOW (ref 7.350–7.450)
pO2, Arterial: 323 mmHg — ABNORMAL HIGH (ref 83.0–108.0)
pO2, Arterial: 150 mmHg — ABNORMAL HIGH (ref 83.0–108.0)

## 2017-02-19 LAB — PROCALCITONIN: Procalcitonin: 0.78 ng/mL

## 2017-02-19 LAB — GLUCOSE, CAPILLARY
GLUCOSE-CAPILLARY: 93 mg/dL (ref 65–99)
GLUCOSE-CAPILLARY: 46 mg/dL — AB (ref 65–99)
Glucose-Capillary: 166 mg/dL — ABNORMAL HIGH (ref 65–99)
Glucose-Capillary: 86 mg/dL (ref 65–99)

## 2017-02-19 LAB — PROTIME-INR
INR: 0.99
Prothrombin Time: 13 seconds (ref 11.4–15.2)

## 2017-02-19 LAB — CBC WITH DIFFERENTIAL/PLATELET
BASOS ABS: 0 10*3/uL (ref 0.0–0.1)
BASOS PCT: 0 %
EOS ABS: 0 10*3/uL (ref 0.0–0.7)
EOS PCT: 0 %
HCT: 40.5 % (ref 39.0–52.0)
Hemoglobin: 13.3 g/dL (ref 13.0–17.0)
Lymphocytes Relative: 4 %
Lymphs Abs: 0.5 10*3/uL — ABNORMAL LOW (ref 0.7–4.0)
MCH: 34.1 pg — ABNORMAL HIGH (ref 26.0–34.0)
MCHC: 32.8 g/dL (ref 30.0–36.0)
MCV: 103.8 fL — ABNORMAL HIGH (ref 78.0–100.0)
MONO ABS: 0.4 10*3/uL (ref 0.1–1.0)
Monocytes Relative: 3 %
Neutro Abs: 11.2 10*3/uL — ABNORMAL HIGH (ref 1.7–7.7)
Neutrophils Relative %: 93 %
PLATELETS: 319 10*3/uL (ref 150–400)
RBC: 3.9 MIL/uL — ABNORMAL LOW (ref 4.22–5.81)
RDW: 14.2 % (ref 11.5–15.5)
WBC: 12.1 10*3/uL — ABNORMAL HIGH (ref 4.0–10.5)

## 2017-02-19 LAB — COMPREHENSIVE METABOLIC PANEL
ALBUMIN: 4.2 g/dL (ref 3.5–5.0)
ALT: 18 U/L (ref 17–63)
AST: 19 U/L (ref 15–41)
Alkaline Phosphatase: 64 U/L (ref 38–126)
Anion gap: 19 — ABNORMAL HIGH (ref 5–15)
BUN: 45 mg/dL — ABNORMAL HIGH (ref 6–20)
CHLORIDE: 95 mmol/L — AB (ref 101–111)
CO2: 19 mmol/L — ABNORMAL LOW (ref 22–32)
Calcium: 10.6 mg/dL — ABNORMAL HIGH (ref 8.9–10.3)
Creatinine, Ser: 11.92 mg/dL — ABNORMAL HIGH (ref 0.61–1.24)
GFR calc Af Amer: 4 mL/min — ABNORMAL LOW (ref >60)
GFR calc non Af Amer: 4 mL/min — ABNORMAL LOW (ref >60)
Glucose, Bld: <20 mg/dL — CL (ref 65–99)
POTASSIUM: 4.1 mmol/L (ref 3.5–5.1)
SODIUM: 133 mmol/L — AB (ref 135–145)
Total Bilirubin: 0.7 mg/dL (ref 0.3–1.2)
Total Protein: 7.7 g/dL (ref 6.5–8.1)

## 2017-02-19 LAB — APTT: APTT: 29 s (ref 24–36)

## 2017-02-19 LAB — MRSA PCR SCREENING: MRSA by PCR: POSITIVE — AB

## 2017-02-19 LAB — RAPID URINE DRUG SCREEN, HOSP PERFORMED
Amphetamines: NOT DETECTED
Barbiturates: NOT DETECTED
Benzodiazepines: NOT DETECTED
Cocaine: NOT DETECTED
Opiates: NOT DETECTED
Tetrahydrocannabinol: NOT DETECTED

## 2017-02-19 LAB — CBG MONITORING, ED
GLUCOSE-CAPILLARY: 79 mg/dL (ref 65–99)
GLUCOSE-CAPILLARY: 67 mg/dL (ref 65–99)
Glucose-Capillary: <10 mg/dL — CL (ref 65–99)
Glucose-Capillary: 119 mg/dL — ABNORMAL HIGH (ref 65–99)

## 2017-02-19 LAB — TRIGLYCERIDES: TRIGLYCERIDES: 49 mg/dL (ref <150)

## 2017-02-19 LAB — STREP PNEUMONIAE URINARY ANTIGEN: STREP PNEUMO URINARY ANTIGEN: NEGATIVE

## 2017-02-19 LAB — PHOSPHORUS: Phosphorus: 3.1 mg/dL (ref 2.5–4.6)

## 2017-02-19 LAB — LACTIC ACID, PLASMA: LACTIC ACID, VENOUS: 1.5 mmol/L (ref 0.5–1.9)

## 2017-02-19 LAB — TROPONIN I
TROPONIN I: 0.03 ng/mL — AB (ref <0.03)
Troponin I: 0.03 ng/mL (ref <0.03)

## 2017-02-19 LAB — MAGNESIUM: MAGNESIUM: 2.3 mg/dL (ref 1.7–2.4)

## 2017-02-19 LAB — AMMONIA: Ammonia: 42 umol/L — ABNORMAL HIGH (ref 9–35)

## 2017-02-19 LAB — ETHANOL: Alcohol, Ethyl (B): <10 mg/dL (ref <10)

## 2017-02-19 LAB — CORTISOL: Cortisol, Plasma: 39.6 ug/dL

## 2017-02-19 MED ORDER — SODIUM CHLORIDE 0.9 % IV SOLN
INTRAVENOUS | Status: DC
  Administered 2017-02-19: 13:00:00 via INTRAVENOUS

## 2017-02-19 MED ORDER — PANTOPRAZOLE SODIUM 40 MG IV SOLR
40.0000 mg | Freq: Every day | INTRAVENOUS | Status: DC
  Administered 2017-02-19 – 2017-02-23 (×5): 40 mg via INTRAVENOUS
  Filled 2017-02-19 (×5): qty 40

## 2017-02-19 MED ORDER — PROPOFOL 1000 MG/100ML IV EMUL
5.0000 ug/kg/min | INTRAVENOUS | Status: DC
  Administered 2017-02-19: 50 ug/kg/min via INTRAVENOUS
  Filled 2017-02-19 (×2): qty 100

## 2017-02-19 MED ORDER — SODIUM CHLORIDE 0.9 % IV SOLN: 250.0000 mL | INTRAVENOUS | Status: DC | PRN

## 2017-02-19 MED ORDER — HEPARIN SODIUM (PORCINE) 5000 UNIT/ML IJ SOLN
5000.0000 [IU] | Freq: Three times a day (TID) | INTRAMUSCULAR | Status: DC
  Administered 2017-02-19 – 2017-02-26 (×20): 5000 [IU] via SUBCUTANEOUS
  Filled 2017-02-19 (×20): qty 1

## 2017-02-19 MED ORDER — NICARDIPINE HCL IN NACL 20-0.86 MG/200ML-% IV SOLN: 3.0000 mg/h | INTRAVENOUS | Status: DC

## 2017-02-19 MED ORDER — DEXTROSE 10 % IV SOLN
INTRAVENOUS | Status: DC
  Administered 2017-02-19 – 2017-02-20 (×2): via INTRAVENOUS

## 2017-02-19 MED ORDER — SODIUM CHLORIDE 0.9 % IV SOLN
25.0000 ug/h | INTRAVENOUS | Status: DC
  Administered 2017-02-19 – 2017-02-20 (×3): 150 ug/h via INTRAVENOUS
  Filled 2017-02-19 (×3): qty 50

## 2017-02-19 MED ORDER — DEXTROSE 50 % IV SOLN
INTRAVENOUS | Status: AC
  Administered 2017-02-19: 50 mL
  Filled 2017-02-19: qty 50

## 2017-02-19 MED ORDER — PANTOPRAZOLE SODIUM 40 MG IV SOLR: 40.0000 mg | Freq: Every day | INTRAVENOUS | Status: DC

## 2017-02-19 MED ORDER — SODIUM CHLORIDE 0.9 % IV SOLN
62.5000 mg | INTRAVENOUS | Status: DC
  Filled 2017-02-19: qty 5

## 2017-02-19 MED ORDER — ORAL CARE MOUTH RINSE
15.0000 mL | Freq: Four times a day (QID) | OROMUCOSAL | Status: DC
  Administered 2017-02-20 – 2017-02-26 (×25): 15 mL via OROMUCOSAL

## 2017-02-19 MED ORDER — PROPOFOL 1000 MG/100ML IV EMUL
5.0000 ug/kg/min | Freq: Once | INTRAVENOUS | Status: AC
  Administered 2017-02-19: 20 ug/kg/min via INTRAVENOUS

## 2017-02-19 MED ORDER — CHLORHEXIDINE GLUCONATE 0.12% ORAL RINSE (MEDLINE KIT)
15.0000 mL | Freq: Two times a day (BID) | OROMUCOSAL | Status: DC
  Administered 2017-02-19 – 2017-02-25 (×12): 15 mL via OROMUCOSAL

## 2017-02-19 MED ORDER — PROPOFOL 1000 MG/100ML IV EMUL
INTRAVENOUS | Status: AC
  Administered 2017-02-19: 60 ug/kg/min
  Filled 2017-02-19: qty 100

## 2017-02-19 MED ORDER — ROCURONIUM BROMIDE 50 MG/5ML IV SOLN
INTRAVENOUS | Status: AC | PRN
  Administered 2017-02-19: 80 mg via INTRAVENOUS

## 2017-02-19 MED ORDER — ETOMIDATE 2 MG/ML IV SOLN
INTRAVENOUS | Status: AC | PRN
  Administered 2017-02-19: 20 mg via INTRAVENOUS

## 2017-02-19 MED ORDER — CINACALCET HCL 30 MG PO TABS: 120.0000 mg | ORAL_TABLET | ORAL | Status: DC

## 2017-02-19 NOTE — ED Triage Notes (Signed)
Pt arrives via [facility] from home, pts daughter called out for pt being unresponsive, lsn 2am, pt withdraws from pain only, snoring respirations, npa in place. cbg 71. Missed dialysis on Wednesday. bp 260/180 with ems.

## 2017-02-19 NOTE — ED Notes (Signed)
Repeat cbg 79

## 2017-02-19 NOTE — ED Notes (Signed)
Call to primary RN to notify her of pt CBG

## 2017-02-19 NOTE — ED Notes (Addendum)
This Rn was called stating pts glucose in labwork less than 20, cbg checked reading low, pt administered amp of D50 at this time. Critical care md paged to make aware

## 2017-02-19 NOTE — ED Notes (Signed)
Pt daughter Axyl Sitzman leaving bedside - emergency contact in patient chart, would like to be notified when patient has admission assignment.

## 2017-02-19 NOTE — Progress Notes (Signed)
Subjective:  Recent dc from admit  Failed to Show for HD  Wed 02/17/17(sec constipation)/ today at 2am daughter found AMS with "Torus Respirations", EMS  Responded found initailo bp 230/190 and in Er 200/170, Intubated in ER  for mechanical ventilatory support / BS 20 in ER (noted per Daughter was taking Glucotrol at home even with BS "low on his accu ck machine"  Was not on Glucotrol  With recent admit).    Objective Vital signs in last 24 hours: Vitals:   02/19/17 1345 02/19/17 1400 02/19/17 1415 02/19/17 1430  BP:  (!) 141/52 (!) 90/53 (!) 91/59  Pulse:  91 86 87  Resp:  (!) 21 19 20   Temp:      TempSrc:      SpO2: 100% 100% 98% 100%   Weight change:   Physical Exam: General:  intubated  Elderly AAM ER  Obtunded  / not following commands  Heart: RRR; no murmur/ no galop or rub  Lungs: Intubated / CTAB Extremities: No LE edema, L leg in cast Dialysis Access: R AVF + thrill  Dialysis Orders:  MWF [facility]  2k, 2ca   4 hr 15 min 180 NRe 400/800 (new edw ~ 85kg)2.0 K/2.5 Ca  -Heparin 8600 units IV TIW -Calcitriol 2.0 mcg PO TIW -Sensipar 120 mg PO TIW -Venofer 50 mg IV weekly -Mircera 60 mcg IV q 2 weeks (Last dose 02/12/17)    Problem/Plan: 1. AMS - with Vent. Depend . Resp, Failure with Hypoglycemia( BS 20) and HTN Crisis Initially  - rx per CCM  2.  ESRDMWF HD. = Missed last hd ( 02/17/17)  HD today 3. SP L distal fibula fracture- s/p short leg cast and non-weight bearing PT( sp rehab admit  4. Hypertension/Volume: ^^ HTN Initially in ER / with recent dc was on Amlodipine 10mg  hs, Hydralazine 25 mg q 8hr , Toporol  xl 12.5mg  tues, thur sat, sunday  / on CXR  No active dz 5. Anemia: Hgb 13.5  , no esa today  Fu hgb trend /  continue weekly Aranesp when hgb below 11/ weekly fe on hd  6. BMD: Phos ok, Cor Ca^^. Hold Calcitriol for now and follow. On PhosLo binder/Sensipar 120 mg on hd days can hold till pos  (will check PTH ,due on labs) 7. DM type 2 - per admit (noted on NO MEDS  AT DC )    Ernest Haber, PA-C [facility] Beeper [phone] 02/19/2017,2:38 PM  LOS: 0 days   Pt seen, examined and agree w A/P as above.  Kelly Splinter MD [facility] pager [phone]   02/20/2017, 7:34 AM     Labs: Basic Metabolic Panel: Recent Labs  Lab 02/12/17 1849 02/15/17 1856 02/19/17 1040  NA 131* 130* 133*  K 4.3 4.5 4.1  CL 94* 93* 95*  CO2 27 23 19*  GLUCOSE 113* 83 <20*  BUN 30* 36* 45*  CREATININE 9.38* 10.22* 11.92*  CALCIUM 10.4* 10.6* 10.6*  PHOS 4.4 4.2 3.1   Liver Function Tests: Recent Labs  Lab 02/12/17 1849 02/15/17 1856 02/19/17 1040  AST  --   --  19  ALT  --   --  18  ALKPHOS  --   --  64  BILITOT  --   --  0.7  PROT  --   --  7.7  ALBUMIN 3.6 3.5 4.2   No results for input(s): LIPASE, AMYLASE in the last 168 hours. No results for input(s): AMMONIA in the  last 168 hours. CBC: Recent Labs  Lab 02/12/17 1850 02/15/17 1856 02/19/17 1040  WBC 7.5 7.6 12.1*  NEUTROABS  --   --  11.2*  HGB 9.5* 10.0* 13.3  HCT 29.4* 30.5* 40.5  MCV 105.0* 104.1* 103.8*  PLT 296 253 319   Cardiac Enzymes: Recent Labs  Lab 02/19/17 1040  TROPONINI 0.03*   CBG: Recent Labs  Lab 02/15/17 1640 02/15/17 2323 02/16/17 0628 02/19/17 1330 02/19/17 1345  GLUCAP 80 114* 94 <10* 79    Studies/Results: Ct Head Wo Contrast  Result Date: 02/19/2017 CLINICAL DATA:  67 year old with altered level of consciousness. Found unresponsive. EXAM: CT HEAD WITHOUT CONTRAST TECHNIQUE: Contiguous axial images were obtained from the base of the skull through the vertex without intravenous contrast. COMPARISON:  CT 01/24/2017. FINDINGS: Brain: There is no evidence of acute intracranial hemorrhage, mass lesion, brain edema or extra-axial fluid collection. There is stable mild generalized atrophy with patchy low-density in the periventricular white matter, likely due to chronic small vessel ischemic changes. There is no CT evidence of  acute cortical infarction. Vascular: Intracranial vascular calcifications. No hyperdense vessel identified. Skull: Negative for fracture or focal lesion. Sinuses/Orbits: The visualized paranasal sinuses and mastoid air cells are clear. No orbital abnormalities are seen. Other: None. IMPRESSION: 1. Stable head CT with atrophy and chronic small vessel ischemic changes. 2. No acute intracranial findings identified. Electronically Signed   By: Richardean Sale M.D.   On: 02/19/2017 12:08   Dg Chest Portable 1 View  Result Date: 02/19/2017 CLINICAL DATA:  ET tube placement EXAM: PORTABLE CHEST 1 VIEW COMPARISON:  02/08/2017 FINDINGS: Endotracheal tube is 4 cm above the carina. Right dialysis catheter is unchanged. Heart is normal size. Lungs are clear. No effusions or acute bony abnormality. IMPRESSION: Endotracheal tube 4 cm above the carina. No active cardiopulmonary disease. Electronically Signed   By: Rolm Baptise M.D.   On: 02/19/2017 10:54   Dg Abd Portable 1 View  Result Date: 02/19/2017 CLINICAL DATA:  NG tube placement. EXAM: PORTABLE ABDOMEN - 1 VIEW COMPARISON:  None. FINDINGS: The tip of the NG tube is in the fundal region the stomach. The proximal port is in the distal esophagus. This could be advanced several cm. The bowel gas pattern is unremarkable. Surgical changes from prior hernia repair with mesh. Bilateral hip prosthesis are noted. There appears to be a temperature probe in the bladder. IMPRESSION: The NG tube tip is in the fundal region the stomach and the proximal port is in the distal esophagus. Electronically Signed   By: Marijo Sanes M.D.   On: 02/19/2017 12:58   Medications: . sodium chloride    . sodium chloride Stopped (02/19/17 1357)  . dextrose 50 mL/hr at 02/19/17 1358  . niCARDipine Stopped (02/19/17 1300)   . pantoprazole (PROTONIX) IV  40 mg Intravenous QHS

## 2017-02-19 NOTE — ED Notes (Signed)
Critical Care team at bedside.  ?

## 2017-02-19 NOTE — Consult Note (Signed)
PULMONARY / CRITICAL CARE MEDICINE   Name: [name] MRN: [id number] DOB: 1950-11-17    ADMISSION DATE:  02/19/2017 CONSULTATION DATE: 02/19/2017  REFERRING MD: Emergency department physician  CHIEF COMPLAINT: Altered mental status  HISTORY OF PRESENT ILLNESS:   67 year old male with extensive past medical history including essential hypertension, end-stage renal disease, diabetes mellitus, anemia, chronic obstructive pulmonary disease noted to be still smoking by last evaluation and close fracture of his left tibia. He presents the emergency department [facility] 02/19/2017 after last being seen normal at 2 AM on 02/19/2017.  His daughter found him in the bed was to torus respirations EMS arrived and found her blood pressure be 230/190.  He was admitted to the emergency department and found to have a blood pressure of 200/170.  He was intubated placed on mechanical ventilatory support and is currently in the process to have a CT of the head for questionable intracerebral hemorrhage.  Note that he missed hemodialysis on Wednesday, 02/18/2017 and renal services will be notified of his admission.  With his hypertensive crisis his altered mental status may be from press.  We will address hypertension CT of the head to rule out intracerebral hemorrhage.  He will be admitted to the intensive care unit for further evaluation and treatment.   PAST MEDICAL HISTORY :  He  has a past medical history of Arthritis, Cancer ([place]), Chronic kidney disease, COPD (chronic obstructive pulmonary disease) ([place]), Diabetes mellitus, GERD (gastroesophageal reflux disease), High cholesterol, and Hypertension.  PAST SURGICAL HISTORY: He  has a past surgical history that includes Total hip arthroplasty; Knee surgery; Colon resection; Total shoulder arthroplasty; AV fistula placement (Left, 03/13/2015); AV fistula placement (Right, 02/18/2016); AV fistula placement (Right, 04/02/2016); A/V Fistulagram (N/A, 09/24/2016);  Revison of arteriovenous fistula (Right, 11/05/2016); A/V Fistulagram (N/A, 12/28/2016); PERIPHERAL VASCULAR BALLOON ANGIOPLASTY (Right, 12/28/2016); Vascular surgery; Colon surgery; Joint replacement; and Cardiac catheterization.  Allergies  Allergen Reactions  . Pravastatin Other (See Comments)    MYALGIAS, MUSCLE PAIN  . Simvastatin Other (See Comments)    MYALGIAS, MUSCLE PAIN    No current facility-administered medications on file prior to encounter.    Current Outpatient Medications on File Prior to Encounter  Medication Sig  . acetaminophen (TYLENOL) 500 MG tablet Take 1 tablet (500 mg total) by mouth every 6 (six) hours as needed for mild pain or moderate pain.  Marland Kitchen albuterol (PROVENTIL HFA;VENTOLIN HFA) 108 (90 Base) MCG/ACT inhaler Inhale 1-2 puffs into the lungs every 6 (six) hours as needed for wheezing.  Marland Kitchen amLODipine (NORVASC) 10 MG tablet Take 1 tablet (10 mg total) by mouth daily.  Marland Kitchen atorvastatin (LIPITOR) 20 MG tablet Take 1 tablet (20 mg total) by mouth daily at 6 PM.  . calcium acetate (PHOSLO) 667 MG capsule Take 2 capsules (1,334 mg total) by mouth 3 (three) times daily.  . cinacalcet (SENSIPAR) 30 MG tablet 120 mg, Oral, Custom (Once per day on Mon Wed Fri),  . folic acid (FOLVITE) 1 MG tablet Take 1 tablet (1 mg total) by mouth daily.  . hydrALAZINE (APRESOLINE) 25 MG tablet Take 1 tablet (25 mg total) by mouth every 8 (eight) hours.  . metoprolol succinate (TOPROL-XL) 25 MG 24 hr tablet Take 0.5 tablets (12.5 mg total) by mouth every Tuesday, Thursday, Saturday, and Sunday.  . pantoprazole (PROTONIX) 40 MG tablet Take 1 tablet (40 mg total) by mouth daily.  . tamsulosin (FLOMAX) 0.4 MG CAPS capsule Take 1 capsule (0.4 mg total) by mouth daily.  FAMILY HISTORY:  His indicated that his mother is deceased. He indicated that his father is deceased. He indicated that the status of his neg hx is unknown.   SOCIAL HISTORY: He  reports that he has been smoking  cigarettes.  He has a 13.00 pack-year smoking history. he has never used smokeless tobacco. He reports that he does not drink alcohol or use drugs.  REVIEW OF SYSTEMS:   Not available  SUBJECTIVE:  67 year old male sedated on the ventilator  VITAL SIGNS: BP (!) 190/117   Pulse (!) 130   Resp 20   SpO2 100%   HEMODYNAMICS:    VENTILATOR SETTINGS: Vent Mode: PRVC FiO2 (%):  [100 %] 100 % Set Rate:  [15 bmp] 15 bmp Vt Set:  [560 mL] 560 mL PEEP:  [5 cmH20] 5 cmH20 Plateau Pressure:  [20 cmH20] 20 cmH20  INTAKE / OUTPUT: No intake/output data recorded.  PHYSICAL EXAMINATION: General: Well-nourished well-developed male sedated with propofol and currently on full mechanical ventilatory support Neuro: Reported to pull at tubes when not sedated.  At the time of examination is heavily sedated. HEENT: No JVD is appreciated endotracheal tube connected to ventilator Cardiovascular: Heart sounds are regular regular rate and rhythm Lungs: Coarse rhonchi bilateral faint expiratory wheeze Abdomen: Abdomen is obese soft nontender, midline abdominal incision is noted. Musculoskeletal: Left lower extremity has a short leg cast on.  Left knee incision marker is noted Skin: Warm and dry  LABS:  BMET Recent Labs  Lab 02/12/17 1849 02/15/17 1856  NA 131* 130*  K 4.3 4.5  CL 94* 93*  CO2 27 23  BUN 30* 36*  CREATININE 9.38* 10.22*  GLUCOSE 113* 83    Electrolytes Recent Labs  Lab 02/12/17 1849 02/15/17 1856  CALCIUM 10.4* 10.6*  PHOS 4.4 4.2    CBC Recent Labs  Lab 02/12/17 1850 02/15/17 1856  WBC 7.5 7.6  HGB 9.5* 10.0*  HCT 29.4* 30.5*  PLT 296 253    Coag's No results for input(s): APTT, INR in the last 168 hours.  Sepsis Markers No results for input(s): LATICACIDVEN, PROCALCITON, O2SATVEN in the last 168 hours.  ABG No results for input(s): PHART, PCO2ART, PO2ART in the last 168 hours.  Liver Enzymes Recent Labs  Lab 02/12/17 1849 02/15/17 1856   ALBUMIN 3.6 3.5    Cardiac Enzymes No results for input(s): TROPONINI, PROBNP in the last 168 hours.  Glucose Recent Labs  Lab 02/14/17 2053 02/15/17 0635 02/15/17 1116 02/15/17 1640 02/15/17 2323 02/16/17 0628  GLUCAP 95 105* 81 80 114* 94    Imaging Dg Chest Portable 1 View  Result Date: 02/19/2017 CLINICAL DATA:  ET tube placement EXAM: PORTABLE CHEST 1 VIEW COMPARISON:  02/08/2017 FINDINGS: Endotracheal tube is 4 cm above the carina. Right dialysis catheter is unchanged. Heart is normal size. Lungs are clear. No effusions or acute bony abnormality. IMPRESSION: Endotracheal tube 4 cm above the carina. No active cardiopulmonary disease. Electronically Signed   By: Rolm Baptise M.D.   On: 02/19/2017 10:54     STUDIES:  02/19/2017 CT of the head>>  CULTURES:   ANTIBIOTICS:   SIGNIFICANT EVENTS: 02/19/2017 found with altered mental status and respiratory respirations and admitted to [facility]  LINES/TUBES: 02/19/2017 endotracheal tube>>  DISCUSSION: 67 year old male with extensive past medical history including essential hypertension, end-stage renal disease, diabetes mellitus, anemia, chronic obstructive pulmonary disease noted to be still smoking by last evaluation and close fracture of his left tibia. He presents the emergency department  [facility] 02/19/2017 after last being seen normal at 2 AM on 02/19/2017.  His daughter found him in the bed was to torus respirations EMS arrived and found her blood pressure be 230/190.  He was admitted to the emergency department and found to have a blood pressure of 200/170.  He was intubated placed on mechanical ventilatory support and is currently in the process to have a CT of the head for questionable intracerebral hemorrhage.  Note that he missed hemodialysis on Wednesday, 02/18/2017 and renal services will be notified of his admission.  With his hypertensive crisis his altered mental status may be from press.  We will  address hypertension CT of the head to rule out intracerebral hemorrhage.  He will be admitted to the intensive care unit for further evaluation and treatment.  ASSESSMENT / PLAN:  PULMONARY A: Vent dependent respiratory failure secondary to hypertensive crisis questionable intracerebral process P:   Vent bundle Chest x-ray Arterial blood gas  CARDIOVASCULAR A:  Hypertensive emergency Chronic hypertension P:  Anti-hypertensives as needed Suspect once he has dialysis should improved Sedation should lower blood pressure  RENAL Lab Results  Component Value Date   CREATININE 10.22 (H) 02/15/2017   CREATININE 9.38 (H) 02/12/2017   CREATININE 9.86 (H) 02/10/2017   Recent Labs  Lab 02/12/17 1849 02/15/17 1856  K 4.3 4.5    A:   End-stage renal disease Monday Wednesdays and Fridays.  Note he missed dialysis on Wednesday for unknown reason.   P:   Renal consult  GASTROINTESTINAL A:   GI protection Suspected constipation from recent rehab admission P:   PPI KUB to evaluate for possible small bowel obstruction  HEMATOLOGIC No results for input(s): HGB in the last 72 hours. Lab Results  Component Value Date   INR 1.7 (H) 09/11/2007   INR 1.5 09/10/2007   INR 1.1 09/09/2007    A:   Hold heparin for possible intracerebral hemorrhage until CT has clear P:  Unable to place PAS on left leg due to fracture He has some right leg  INFECTIOUS A:   No overt source for infection Note he has a tunneled right IJ hemodialysis catheter as a possible source for infection He also has a nondisplaced closed fracture of the left leg. P:   Monitor fever and  white blood cell count curves  ENDOCRINE CBG (last 3)  No results for input(s): GLUCAP in the last 72 hours.   A:   Noted to be prediabetic P:   Sliding scale insulin every 4 hours  NEUROLOGIC A:   Admitted 02/19/2017 with altered mental status To be severely hypertensive with EMS noted 230/190 Consideration for  press syndrome Note he has a fractured left leg possible short leg cast consideration for PE P:   RASS goal: 0--1 Sedation as needed for tube tolerance   FAMILY  - Updates: No family at the bedside  - Inter-disciplinary family meet or Palliative Care meeting due by:  day 7  App  60 min cct  Richardson Landry Minor ACNP Maryanna Shape PCCM Pager [phone] till 1 pm If no answer page [phone] 02/19/2017, 11:33 AM  Attending Note:  67 year old male who is an ESRD who missed dialysis and presents with AMS and hypertensive emergency.  Patient was intubated for airway protection and PCCM was called to admit.  Upon evaluating patient, he was sedated and paralyzed with diffuse rales.  I reviewed head CT myself, no bleed seen.  Will call renal and neurology  to evaluate.  Start cardene drip for BP control for SBP of 185 target but will defer to neuro on that.  Admit to the ICU.  Dialysis pre renal.  Sedation as needed.    The patient is critically ill with multiple organ systems failure and requires high complexity decision making for assessment and support, frequent evaluation and titration of therapies, application of advanced monitoring technologies and extensive interpretation of multiple databases.   Critical Care Time devoted to patient care services described in this note is  45  Minutes. This time reflects time of care of this signee Dr Jennet Maduro. This critical care time does not reflect procedure time, or teaching time or supervisory time of PA/NP/Med student/Med Resident etc but could involve care discussion time.  Rush Farmer, M.D. [facility] Pulmonary/Critical Care Medicine. Pager: [phone]. After hours pager: [phone].

## 2017-02-19 NOTE — ED Notes (Signed)
Renal PA at bedside. 

## 2017-02-19 NOTE — ED Notes (Signed)
Critical care paged to [facility] per her request

## 2017-02-19 NOTE — ED Notes (Signed)
Dr Nelda Marseille at bedside

## 2017-02-19 NOTE — ED Notes (Signed)
Patient transported to CT by this RN and RRT

## 2017-02-19 NOTE — ED Notes (Signed)
Preparing for intubation
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
## 2017-02-19 NOTE — ED Notes (Signed)
Dr Nelda Marseille advised start pt on d10 drip at 45ml/hr and stop current infusion of ns. Also advised pt will need q1h cbg monitoring

## 2017-02-19 NOTE — ED Notes (Signed)
Family at bedside. Updated on plan of care for patient.

## 2017-02-19 NOTE — Telephone Encounter (Signed)
We retried endlessly to encourage and provide this gentleman softeners, laxatives, suppositories, enema enemas, etc. and he refused each one.  He may have a softener or laxative if he is willing to accept one

## 2017-02-20 ENCOUNTER — Inpatient Hospital Stay (HOSPITAL_COMMUNITY): Payer: Medicare Other

## 2017-02-20 DIAGNOSIS — Z992 Dependence on renal dialysis: Secondary | ICD-10-CM

## 2017-02-20 DIAGNOSIS — I674 Hypertensive encephalopathy: Principal | ICD-10-CM

## 2017-02-20 LAB — GLUCOSE, CAPILLARY
GLUCOSE-CAPILLARY: 72 mg/dL (ref 65–99)
GLUCOSE-CAPILLARY: 73 mg/dL (ref 65–99)
GLUCOSE-CAPILLARY: 116 mg/dL — AB (ref 65–99)
GLUCOSE-CAPILLARY: 92 mg/dL (ref 65–99)
GLUCOSE-CAPILLARY: 111 mg/dL — AB (ref 65–99)
GLUCOSE-CAPILLARY: 77 mg/dL (ref 65–99)
GLUCOSE-CAPILLARY: 126 mg/dL — AB (ref 65–99)
Glucose-Capillary: 102 mg/dL — ABNORMAL HIGH (ref 65–99)
Glucose-Capillary: 126 mg/dL — ABNORMAL HIGH (ref 65–99)
Glucose-Capillary: 67 mg/dL (ref 65–99)
Glucose-Capillary: 70 mg/dL (ref 65–99)
Glucose-Capillary: 74 mg/dL (ref 65–99)
Glucose-Capillary: 96 mg/dL (ref 65–99)
Glucose-Capillary: 99 mg/dL (ref 65–99)
Glucose-Capillary: 99 mg/dL (ref 65–99)

## 2017-02-20 LAB — MAGNESIUM: Magnesium: 2 mg/dL (ref 1.7–2.4)

## 2017-02-20 LAB — RENAL FUNCTION PANEL
ALBUMIN: 3.1 g/dL — AB (ref 3.5–5.0)
Anion gap: 16 — ABNORMAL HIGH (ref 5–15)
BUN: 53 mg/dL — AB (ref 6–20)
CHLORIDE: 92 mmol/L — AB (ref 101–111)
CO2: 17 mmol/L — ABNORMAL LOW (ref 22–32)
Calcium: 8.7 mg/dL — ABNORMAL LOW (ref 8.9–10.3)
Creatinine, Ser: 13.01 mg/dL — ABNORMAL HIGH (ref 0.61–1.24)
GFR calc Af Amer: 4 mL/min — ABNORMAL LOW (ref >60)
GFR, EST NON AFRICAN AMERICAN: 3 mL/min — AB (ref >60)
Glucose, Bld: 104 mg/dL — ABNORMAL HIGH (ref 65–99)
Phosphorus: 4.6 mg/dL (ref 2.5–4.6)
Potassium: 4.3 mmol/L (ref 3.5–5.1)
SODIUM: 125 mmol/L — AB (ref 135–145)

## 2017-02-20 LAB — CBC WITH DIFFERENTIAL/PLATELET
Basophils Absolute: 0 10*3/uL (ref 0.0–0.1)
Basophils Relative: 0 %
EOS ABS: 0.1 10*3/uL (ref 0.0–0.7)
Eosinophils Relative: 1 %
HEMATOCRIT: 33 % — AB (ref 39.0–52.0)
Hemoglobin: 10.8 g/dL — ABNORMAL LOW (ref 13.0–17.0)
LYMPHS ABS: 1.3 10*3/uL (ref 0.7–4.0)
LYMPHS PCT: 13 %
MCH: 33.5 pg (ref 26.0–34.0)
MCHC: 32.7 g/dL (ref 30.0–36.0)
MCV: 102.5 fL — AB (ref 78.0–100.0)
MONOS PCT: 18 %
Monocytes Absolute: 1.8 10*3/uL — ABNORMAL HIGH (ref 0.1–1.0)
NEUTROS PCT: 68 %
Neutro Abs: 6.8 10*3/uL (ref 1.7–7.7)
Platelets: 184 10*3/uL (ref 150–400)
RBC: 3.22 MIL/uL — ABNORMAL LOW (ref 4.22–5.81)
RDW: 14.4 % (ref 11.5–15.5)
WBC: 10 10*3/uL (ref 4.0–10.5)

## 2017-02-20 LAB — CBC
HEMATOCRIT: 32.8 % — AB (ref 39.0–52.0)
Hemoglobin: 10.6 g/dL — ABNORMAL LOW (ref 13.0–17.0)
MCH: 33.3 pg (ref 26.0–34.0)
MCHC: 32.3 g/dL (ref 30.0–36.0)
MCV: 103.1 fL — ABNORMAL HIGH (ref 78.0–100.0)
Platelets: 241 10*3/uL (ref 150–400)
RBC: 3.18 MIL/uL — AB (ref 4.22–5.81)
RDW: 14.3 % (ref 11.5–15.5)
WBC: 12.6 10*3/uL — ABNORMAL HIGH (ref 4.0–10.5)

## 2017-02-20 LAB — BLOOD GAS, ARTERIAL
ACID-BASE DEFICIT: 4.6 mmol/L — AB (ref 0.0–2.0)
Bicarbonate: 19.9 mmol/L — ABNORMAL LOW (ref 20.0–28.0)
Drawn by: 41422
FIO2: 40
O2 Saturation: 98.4 %
PEEP: 5 cmH2O
Patient temperature: 98.6
RATE: 20 resp/min
VT: 560 mL
pCO2 arterial: 36.4 mmHg (ref 32.0–48.0)
pH, Arterial: 7.357 (ref 7.350–7.450)
pO2, Arterial: 130 mmHg — ABNORMAL HIGH (ref 83.0–108.0)

## 2017-02-20 LAB — BASIC METABOLIC PANEL
ANION GAP: 17 — AB (ref 5–15)
BUN: 50 mg/dL — ABNORMAL HIGH (ref 6–20)
CALCIUM: 9.2 mg/dL (ref 8.9–10.3)
CO2: 17 mmol/L — ABNORMAL LOW (ref 22–32)
Chloride: 94 mmol/L — ABNORMAL LOW (ref 101–111)
Creatinine, Ser: 12.31 mg/dL — ABNORMAL HIGH (ref 0.61–1.24)
GFR calc Af Amer: 4 mL/min — ABNORMAL LOW (ref >60)
GFR, EST NON AFRICAN AMERICAN: 4 mL/min — AB (ref >60)
Glucose, Bld: 71 mg/dL (ref 65–99)
POTASSIUM: 4 mmol/L (ref 3.5–5.1)
SODIUM: 128 mmol/L — AB (ref 135–145)

## 2017-02-20 LAB — URINE CULTURE: Special Requests: NORMAL

## 2017-02-20 LAB — PHOSPHORUS: Phosphorus: 3.7 mg/dL (ref 2.5–4.6)

## 2017-02-20 LAB — TROPONIN I: TROPONIN I: 0.03 ng/mL — AB (ref <0.03)

## 2017-02-20 MED ORDER — VANCOMYCIN HCL 10 G IV SOLR: 2000.0000 mg | Freq: Once | INTRAVENOUS | Status: DC

## 2017-02-20 MED ORDER — MUPIROCIN 2 % EX OINT
1.0000 "application " | TOPICAL_OINTMENT | Freq: Two times a day (BID) | CUTANEOUS | Status: AC
  Administered 2017-02-20 – 2017-02-24 (×9): 1 via NASAL
  Filled 2017-02-20: qty 22

## 2017-02-20 MED ORDER — PIPERACILLIN-TAZOBACTAM 3.375 G IVPB
3.3750 g | Freq: Two times a day (BID) | INTRAVENOUS | Status: DC
  Administered 2017-02-21 – 2017-02-23 (×5): 3.375 g via INTRAVENOUS
  Filled 2017-02-20 (×6): qty 50

## 2017-02-20 MED ORDER — DEXTROSE 50 % IV SOLN
INTRAVENOUS | Status: AC
  Administered 2017-02-20: 50 mL
  Filled 2017-02-20: qty 50

## 2017-02-20 MED ORDER — CHLORHEXIDINE GLUCONATE CLOTH 2 % EX PADS
6.0000 | MEDICATED_PAD | Freq: Every day | CUTANEOUS | Status: AC
  Administered 2017-02-20 – 2017-02-24 (×5): 6 via TOPICAL

## 2017-02-20 MED ORDER — ACETAMINOPHEN 160 MG/5ML PO SOLN
650.0000 mg | Freq: Four times a day (QID) | ORAL | Status: DC | PRN
  Administered 2017-02-21 – 2017-02-22 (×2): 650 mg
  Filled 2017-02-20 (×2): qty 20.3

## 2017-02-20 MED ORDER — VANCOMYCIN HCL 10 G IV SOLR
2000.0000 mg | Freq: Once | INTRAVENOUS | Status: AC
  Administered 2017-02-21: 2000 mg via INTRAVENOUS
  Filled 2017-02-20: qty 2000

## 2017-02-20 NOTE — Progress Notes (Signed)
PULMONARY / CRITICAL CARE MEDICINE   Name: [name] MRN: [id number] DOB: Mar 24, 1950    ADMISSION DATE:  02/19/2017 CONSULTATION DATE: 02/19/2017  REFERRING MD: Emergency department physician  CHIEF COMPLAINT: Altered mental status  HISTORY OF PRESENT ILLNESS:   67 year old male with extensive past medical history including essential hypertension, end-stage renal disease, diabetes mellitus, anemia, chronic obstructive pulmonary disease noted to be still smoking by last evaluation and close fracture of his left tibia. He presents the emergency department [facility] 02/19/2017 after last being seen normal at 2 AM on 02/19/2017.  His daughter found him in the bed was to torus respirations EMS arrived and found her blood pressure be 230/190.  He was admitted to the emergency department and found to have a blood pressure of 200/170.  He was intubated placed on mechanical ventilatory support and is currently in the process to have a CT of the head for questionable intracerebral hemorrhage.  Note that he missed hemodialysis on Wednesday, 02/18/2017 and renal services will be notified of his admission.  With his hypertensive crisis his altered mental status may be from press.  We will address hypertension CT of the head to rule out intracerebral hemorrhage.  He will be admitted to the intensive care unit for further evaluation and treatment.  SUBJECTIVE:  Remains minimally responsive, however excellent tidal volumes on ventilator  VITAL SIGNS: BP 96/67   Pulse 77   Temp 100 F (37.8 C)   Resp (!) 23   Ht 6\' 1"  (1.854 m)   Wt 205 lb 0.4 oz (93 kg)   SpO2 100%   BMI 27.05 kg/m   HEMODYNAMICS:    VENTILATOR SETTINGS: Vent Mode: CPAP;PSV FiO2 (%):  [40 %-100 %] 40 % Set Rate:  [15 bmp-20 bmp] 20 bmp Vt Set:  [560 mL] 560 mL PEEP:  [5 cmH20] 5 cmH20 Pressure Support:  [5 cmH20] 5 cmH20 Plateau Pressure:  [15 cmH20-20 cmH20] 17 cmH20  INTAKE / OUTPUT: I/O last 3 completed  shifts: In: 1423.8 [I.V.:1423.8] Out: 520 [Urine:520]  PHYSICAL EXAMINATION: General: Well-developed African-American male, remains on ventilator, he will open his eyes to verbal stimulus however he will not follow commands or move any extremities and I see no purposeful movement HEENT: Orally intubated no jugular venous distention Neuro: Opens eyes to voice, but will not follow commands, not moving, not purposeful, will not track.  Upward appearing gaze. Pulmonary: Clear to auscultation excellent tidal volume on ventilator cycling on pressure support ventilation Cardiac: Regular rate and rhythm Abdomen: Soft nontender Extremities: Has a left lower extremity short cast,  GU: He is an uric  LABS:  BMET Recent Labs  Lab 02/15/17 1856 02/19/17 1040 02/20/17 0017  NA 130* 133* 128*  K 4.5 4.1 4.0  CL 93* 95* 94*  CO2 23 19* 17*  BUN 36* 45* 50*  CREATININE 10.22* 11.92* 12.31*  GLUCOSE 83 <20* 71    Electrolytes Recent Labs  Lab 02/15/17 1856 02/19/17 1040 02/20/17 0017  CALCIUM 10.6* 10.6* 9.2  MG  --  2.3 2.0  PHOS 4.2 3.1 3.7    CBC Recent Labs  Lab 02/15/17 1856 02/19/17 1040 02/20/17 0017  WBC 7.6 12.1* 10.0  HGB 10.0* 13.3 10.8*  HCT 30.5* 40.5 33.0*  PLT 253 319 184    Coag's Recent Labs  Lab 02/19/17 1353  APTT 29  INR 0.99    Sepsis Markers Recent Labs  Lab 02/19/17 1040 02/19/17 1353  LATICACIDVEN  --  1.5  PROCALCITON  0.78  --     ABG Recent Labs  Lab 02/19/17 1238 02/19/17 1500 02/20/17 0535  PHART 7.267* 7.321* 7.357  PCO2ART 55.3* 45.4 36.4  PO2ART 323.0* 150.0* 130*    Liver Enzymes Recent Labs  Lab 02/15/17 1856 02/19/17 1040  AST  --  19  ALT  --  18  ALKPHOS  --  64  BILITOT  --  0.7  ALBUMIN 3.5 4.2    Cardiac Enzymes Recent Labs  Lab 02/19/17 1040 02/19/17 1815 02/20/17 0017  TROPONINI 0.03* 0.03* 0.03*    Glucose Recent Labs  Lab 02/20/17 0216 02/20/17 0319 02/20/17 0405 02/20/17 0528  02/20/17 0635 02/20/17 0805  GLUCAP 72 73 67 126* 116* 99    Imaging Ct Head Wo Contrast  Result Date: 02/19/2017 CLINICAL DATA:  67 year old with altered level of consciousness. Found unresponsive. EXAM: CT HEAD WITHOUT CONTRAST TECHNIQUE: Contiguous axial images were obtained from the base of the skull through the vertex without intravenous contrast. COMPARISON:  CT 01/24/2017. FINDINGS: Brain: There is no evidence of acute intracranial hemorrhage, mass lesion, brain edema or extra-axial fluid collection. There is stable mild generalized atrophy with patchy low-density in the periventricular white matter, likely due to chronic small vessel ischemic changes. There is no CT evidence of acute cortical infarction. Vascular: Intracranial vascular calcifications. No hyperdense vessel identified. Skull: Negative for fracture or focal lesion. Sinuses/Orbits: The visualized paranasal sinuses and mastoid air cells are clear. No orbital abnormalities are seen. Other: None. IMPRESSION: 1. Stable head CT with atrophy and chronic small vessel ischemic changes. 2. No acute intracranial findings identified. Electronically Signed   By: Richardean Sale M.D.   On: 02/19/2017 12:08   Dg Chest Port 1 View  Result Date: 02/20/2017 CLINICAL DATA:  Endotracheal tube present.  Respiratory failure. EXAM: PORTABLE CHEST 1 VIEW COMPARISON:  One-view chest x-ray 02/20/2016 FINDINGS: Infected tube is stable, 5 cm above the carina. A right IJ dialysis catheter is stable, terminating in the right atrium. NG tube has been placed. Side port is in the distal esophagus. Heart size is normal. Atherosclerotic calcifications are present at the arch. Lung volumes are low. There is no significant airspace consolidation. Mild pulmonary vascular congestion is stable. IMPRESSION: 1. Endotracheal tube and right IJ dialysis catheter are stable. 2. Oval placement of NG tube. The side port is in the esophagus. This could be advanced approximately  15 cm for placement with the side port in the stomach. 3. Stable pulmonary vascular congestion. 4. No significant airspace disease. Electronically Signed   By: San Morelle M.D.   On: 02/20/2017 07:49   Dg Chest Portable 1 View  Result Date: 02/19/2017 CLINICAL DATA:  ET tube placement EXAM: PORTABLE CHEST 1 VIEW COMPARISON:  02/08/2017 FINDINGS: Endotracheal tube is 4 cm above the carina. Right dialysis catheter is unchanged. Heart is normal size. Lungs are clear. No effusions or acute bony abnormality. IMPRESSION: Endotracheal tube 4 cm above the carina. No active cardiopulmonary disease. Electronically Signed   By: Rolm Baptise M.D.   On: 02/19/2017 10:54   Dg Abd Portable 1 View  Result Date: 02/19/2017 CLINICAL DATA:  NG tube placement. EXAM: PORTABLE ABDOMEN - 1 VIEW COMPARISON:  None. FINDINGS: The tip of the NG tube is in the fundal region the stomach. The proximal port is in the distal esophagus. This could be advanced several cm. The bowel gas pattern is unremarkable. Surgical changes from prior hernia repair with mesh. Bilateral hip prosthesis are noted. There appears to be  a temperature probe in the bladder. IMPRESSION: The NG tube tip is in the fundal region the stomach and the proximal port is in the distal esophagus. Electronically Signed   By: Marijo Sanes M.D.   On: 02/19/2017 12:58     STUDIES:  02/19/2017 CT of the head>>  CULTURES:   ANTIBIOTICS:   SIGNIFICANT EVENTS: 02/19/2017 found with altered mental status and respiratory respirations and admitted to [facility]  LINES/TUBES: 02/19/2017 endotracheal tube>>  DISCUSSION: 67 year old male with extensive past medical history including essential hypertension, end-stage renal disease, diabetes mellitus, anemia, chronic obstructive pulmonary disease noted to be still smoking by last evaluation and close fracture of his left tibia. He presents the emergency department [facility] 02/19/2017 after last  being seen normal at 2 AM on 02/19/2017.  He was intubated for airway protection, respiratory failure, and admitted to the intensive care.  Nephrology was consulted,, he is yet to receive dialysis and remains quite encephalopathic suspect this is metabolic in origin at this point however room such as PRES could be a consideration.  Blood pressure is now better controlled  ASSESSMENT / PLAN:  NEUROLOGIC/musculoskeletal A:   Acute encephalopathy: Likely hypertensive encephalopathy versus process such as press  Note he has a fractured left leg possible  Has yet to receive dialysis this would be important blood pressure is better controlled now P:   RASS goal 0 PAD protocol If Mental status remains depressed after dialysis think we need to consider EEG   PULMONARY A: Vent dependent respiratory failure secondary to hypertensive crisis with pulmonary edema Chest x-ray personally reviewed: Endotracheal tube in satisfactory position, dialysis catheter satisfactory.  It appears as though the orogastric tube could be advanced.  He has some mild vascular congestion Barrier to extubation at this point in his mental status P:   Continue full ventilator support with pressure support as tolerated Assess for readiness for extubation daily Dc sedating gtt  CARDIOVASCULAR A:  Hypertensive emergency Chronic hypertension P:  Continue antihypertensives as needed Continue telemetry  RENAL A:   End-stage renal disease Monday Wednesdays and Fridays.  Note he missed dialysis on Wednesday for unknown reason. Hyponatremia and metabolic acidosis -Last dialysis 1/11 P:   Renal consulted and following For dialysis today GASTROINTESTINAL A:   GI protection Constipation P:   Continue PPI Bowel protocol  HEMATOLOGIC A:   Anemia of chronic disease P:  Subcu heparin  trend CBC Transfuse per protocol   INFECTIOUS A:   No overt source for infection Note he has a tunneled right IJ hemodialysis  catheter as a possible source for infection. P:   Trend fever and white blood cell curve ENDOCRINE A:   Prediabetes, now with hypoglycemia requiring D10 P:   Discontinue insulin Continue D10 Serial CBGs    FAMILY  - Updates: No family at the bedside  - Inter-disciplinary family meet or Palliative Care meeting due by:  day 7  App  60 min cct  Attending Note:  67 year old male with ESRD who missed dialysis and presents with hypertensive encephalopathy and respiratory failure from pulmonary edema.  On exam, unresponsive with coarse BS.  I reviewed CXR myself, pulmonary edema noted and ETT ok.  Will await dialysis today.  BP control as ordered above.  In AM pending mental status will consider weaning.  Replace electrolytes.  F/u on labs.  Hold of abx.  Will f/u in AM.  The patient is critically ill with multiple organ systems failure and requires high  complexity decision making for assessment and support, frequent evaluation and titration of therapies, application of advanced monitoring technologies and extensive interpretation of multiple databases.   Critical Care Time devoted to patient care services described in this note is  35  Minutes. This time reflects time of care of this signee Dr Jennet Maduro. This critical care time does not reflect procedure time, or teaching time or supervisory time of PA/NP/Med student/Med Resident etc but could involve care discussion time.  Rush Farmer, M.D. [facility] Pulmonary/Critical Care Medicine. Pager: [phone]. After hours pager: [phone].

## 2017-02-20 NOTE — Progress Notes (Signed)
HD machine alarming arterial high pressures frequently.  Lines reversed, catheter flushed repeatedly.  Issue persists.    Will continue to monitor.

## 2017-02-20 NOTE — Progress Notes (Signed)
CRITICAL VALUE ALERT  Critical Value:  Positive MRSA  Date & Time Notied:  02/20/2016 @ 2220  Provider Notified:  protocol   Orders Received/Actions taken:  MRSA STANDING ORDERS INITIATED

## 2017-02-20 NOTE — Progress Notes (Signed)
Dialysis treatment completed.  4500 mL ultrafiltrated.  4000 mL net fluid removal.  Patient status unchanged. Lung sounds diminished to ausculation in all fields. Generalized edema. Cardiac: ST.  Cleansed RIJ catheter with chlorhexidine.  Disconnected lines and flushed ports with saline per protocol.  Ports locked with heparin and capped per protocol.    Report given to bedside, RN Naaman Plummer.

## 2017-02-20 NOTE — Progress Notes (Signed)
Subjective:  On vent, didn't get HD last night due to full schedule    Objective Vital signs in last 24 hours: Vitals:   02/20/17 0900 02/20/17 0930 02/20/17 1000 02/20/17 1030  BP: 120/65 (!) 143/71 132/66 (!) 147/75  Pulse: 81 88 89 88  Resp: (!) _0 (!) 8  Temp: 99.9 F (37.7 C) 99.7 F (37.6 C) 99.9 F (37.7 C) 99.7 F (37.6 C)  TempSrc:      SpO2: 97% 99% 96% 98%  Weight:      Height:       Weight change:   Physical Exam: General:  intubated  Elderly AAM ER 67  Obtunded  / not following commands  Heart: RRR; no murmur/ no galop or rub  Lungs: Intubated / CTAB Extremities: No LE edema, L leg in cast Dialysis Access: R AVF + thrill  Dialysis Orders:  MWF [facility]  2k, 2ca   4 hr 15 min 180 NRe 400/800 (new edw ~ 85kg)2.0 K/2.5 Ca  -Heparin 8600 units IV TIW -Calcitriol 2.0 mcg PO TIW -Sensipar 120 mg PO TIW -Venofer 50 mg IV weekly -Mircera 60 mcg IV q 2 weeks (Last dose 02/12/17)    Assessment: 1. AMS - on vent, had hypoglycemia( BS 20) and HTN Crisis Initially  - rx per CCM  2. ESRDMWF HD, missed OP HD on 1/09. Didn't get HD last night, plan HD today 3. Recent L distal fibula fracture- s/p short leg cast and non-weight bearing PT 4. Hypertension/Volume overload: ^^ HTN Initially in ER, then BP's dropped and are now low. Cardene ordered never given after BP dropped. Now bp's are in the normal range. Up 8kg by wts, CXR normal however.  5. Anemia: Hgb 13.5  , no esa today  Fu hgb trend /  continue weekly Aranesp when hgb below 11/ weekly fe on hd  6. BMD: Phos ok, Cor Ca^^. Hold Calcitriol for now and follow. On PhosLo binder/Sensipar 120 mg on hd days can hold till pos  (will check PTH ,due on labs) 7. DM type 2 - per admit (noted on NO MEDS AT DC )  Plan - HD today in ICU, get vol down as BP tolerates    Kelly Splinter MD [facility] pager [phone]   02/20/2017, 11:07 AM     Labs: Basic Metabolic Panel: Recent Labs  Lab 02/15/17 1856  02/19/17 1040 02/20/17 0017  NA 130* 133* 128*  K 4.5 4.1 4.0  CL 93* 95* 94*  CO2 23 19* 17*  GLUCOSE 83 <20* 71  BUN 36* 45* 50*  CREATININE 10.22* 11.92* 12.31*  CALCIUM 10.6* 10.6* 9.2  PHOS 4.2 3.1 3.7   Liver Function Tests: Recent Labs  Lab 02/15/17 1856 02/19/17 1040  AST  --  19  ALT  --  18  ALKPHOS  --  64  BILITOT  --  0.7  PROT  --  7.7  ALBUMIN 3.5 4.2   No results for input(s): LIPASE, AMYLASE in the last 168 hours. Recent Labs  Lab 02/19/17 1353  AMMONIA 42*   CBC: Recent Labs  Lab 02/15/17 1856 02/19/17 1040 02/20/17 0017  WBC 7.6 12.1* 10.0  NEUTROABS  --  11.2* 6.8  HGB 10.0* 13.3 10.8*  HCT 30.5* 40.5 33.0*  MCV 104.1* 103.8* 102.5*  PLT 253 319 184   Cardiac Enzymes: Recent Labs  Lab 02/19/17 1040 02/19/17 1815 02/20/17 0017  TROPONINI 0.03* 0.03* 0.03*   CBG: Recent Labs  Lab 02/20/17 0405  02/20/17 0528 02/20/17 0635 02/20/17 0805 02/20/17 1003  GLUCAP 67 126* 116* 99 96    Studies/Results: Ct Head Wo Contrast  Result Date: 02/19/2017 CLINICAL DATA:  67 year old with altered level of consciousness. Found unresponsive. EXAM: CT HEAD WITHOUT CONTRAST TECHNIQUE: Contiguous axial images were obtained from the base of the skull through the vertex without intravenous contrast. COMPARISON:  CT 01/24/2017. FINDINGS: Brain: There is no evidence of acute intracranial hemorrhage, mass lesion, brain edema or extra-axial fluid collection. There is stable mild generalized atrophy with patchy low-density in the periventricular white matter, likely due to chronic small vessel ischemic changes. There is no CT evidence of acute cortical infarction. Vascular: Intracranial vascular calcifications. No hyperdense vessel identified. Skull: Negative for fracture or focal lesion. Sinuses/Orbits: The visualized paranasal sinuses and mastoid air cells are clear. No orbital abnormalities are seen. Other: None. IMPRESSION: 1. Stable head CT with atrophy and  chronic small vessel ischemic changes. 2. No acute intracranial findings identified. Electronically Signed   By: Richardean Sale M.D.   On: 02/19/2017 12:08   Dg Chest Port 1 View  Result Date: 02/20/2017 CLINICAL DATA:  Endotracheal tube present.  Respiratory failure. EXAM: PORTABLE CHEST 1 VIEW COMPARISON:  One-view chest x-ray 02/20/2016 FINDINGS: Infected tube is stable, 5 cm above the carina. A right IJ dialysis catheter is stable, terminating in the right atrium. NG tube has been placed. Side port is in the distal esophagus. Heart size is normal. Atherosclerotic calcifications are present at the arch. Lung volumes are low. There is no significant airspace consolidation. Mild pulmonary vascular congestion is stable. IMPRESSION: 1. Endotracheal tube and right IJ dialysis catheter are stable. 2. Oval placement of NG tube. The side port is in the esophagus. This could be advanced approximately 15 cm for placement with the side port in the stomach. 3. Stable pulmonary vascular congestion. 4. No significant airspace disease. Electronically Signed   By: San Morelle M.D.   On: 02/20/2017 07:49   Dg Chest Portable 1 View  Result Date: 02/19/2017 CLINICAL DATA:  ET tube placement EXAM: PORTABLE CHEST 1 VIEW COMPARISON:  02/08/2017 FINDINGS: Endotracheal tube is 4 cm above the carina. Right dialysis catheter is unchanged. Heart is normal size. Lungs are clear. No effusions or acute bony abnormality. IMPRESSION: Endotracheal tube 4 cm above the carina. No active cardiopulmonary disease. Electronically Signed   By: Rolm Baptise M.D.   On: 02/19/2017 10:54   Dg Abd Portable 1 View  Result Date: 02/19/2017 CLINICAL DATA:  NG tube placement. EXAM: PORTABLE ABDOMEN - 1 VIEW COMPARISON:  None. FINDINGS: The tip of the NG tube is in the fundal region the stomach. The proximal port is in the distal esophagus. This could be advanced several cm. The bowel gas pattern is unremarkable. Surgical changes from prior  hernia repair with mesh. Bilateral hip prosthesis are noted. There appears to be a temperature probe in the bladder. IMPRESSION: The NG tube tip is in the fundal region the stomach and the proximal port is in the distal esophagus. Electronically Signed   By: Marijo Sanes M.D.   On: 02/19/2017 12:58   Medications: . sodium chloride    . dextrose 40 mL/hr (02/20/17 1058)  . fentaNYL infusion INTRAVENOUS Stopped (02/20/17 1040)  . [START ON 02/24/2017] ferric gluconate (FERRLECIT/NULECIT) IV    . niCARDipine Stopped (02/19/17 1300)  . propofol (DIPRIVAN) infusion Stopped (02/20/17 0015)   . chlorhexidine gluconate (MEDLINE KIT)  15 mL Mouth Rinse BID  . Chlorhexidine Gluconate Cloth  6 each Topical Q0600  . [START ON 02/22/2017] cinacalcet  120 mg Oral Q M,W,F-HD  . heparin injection (subcutaneous)  5,000 Units Subcutaneous Q8H  . mouth rinse  15 mL Mouth Rinse QID  . mupirocin ointment  1 application Nasal BID  . pantoprazole (PROTONIX) IV  40 mg Intravenous QHS

## 2017-02-20 NOTE — Progress Notes (Signed)
Late entry- Pt changed back to full vent support d/t apnea.

## 2017-02-20 NOTE — Progress Notes (Signed)
Pharmacy Antibiotic Note  [name] is a 67 y.o. male admitted on 1/11/2019with AMS after recent admit. Also with difficulty breathing. He is ESRD, missed HD. Last HD was yesterday for 4 hours 250-300 ml/hr. Starting empiric abx for fever and elevated white count.   Plan: -Zosyn 3.375 g IV q12h -Vancomycin 2 g IV x1 then 1 g IV qHD *this will be entered based on his schedule -Monitor HD schedule/tolerance, cultures, VR as needed   Height: 6\' 1"  (185.4 cm) Weight: 196 lb 3.4 oz (89 kg) IBW/kg (Calculated) : 79.9  Temp (24hrs), Avg:99.9 F (37.7 C), Min:98.6 F (37 C), Max:101.3 F (38.5 C)  Recent Labs  Lab 02/15/17 1856 02/19/17 1040 02/19/17 1353 02/20/17 0017 02/20/17 1642  WBC 7.6 12.1*  --  10.0 12.6*  CREATININE 10.22* 11.92*  --  12.31* 13.01*  LATICACIDVEN  --   --  1.5  --   --     Estimated Creatinine Clearance: 6.1 mL/min (A) (by C-G formula based on SCr of 13.01 mg/dL (H)).    Allergies  Allergen Reactions  . Pravastatin Other (See Comments)    MYALGIAS, MUSCLE PAIN  . Simvastatin Other (See Comments)    MYALGIAS, MUSCLE PAIN    Antimicrobials this admission: 1/13 vancomycin > 1/13 zosyn >   Dose adjustments this admission: N/A   Microbiology results: 1/11 blood cx: 1/11 urine cx: 1/11 mrsa: pos    MastersJake [name] 02/20/2017 11:32 PM

## 2017-02-20 NOTE — Progress Notes (Signed)
eLink Physician-Brief Progress Note Patient Name: [name] DOB: [birth year] MRN: [id number]   Date of Service  02/20/2017  HPI/Events of Note  Fever to 101.5 F in setting of elevated WBC and urine culture from 02/19/2017 with multiple organisms. Blood cultures x 2 from 02/19/2017 are pending. AST and ALT are both normal.   eICU Interventions  Will order: 1. Urine culture now.  2. Vancomycin and Zosyn per pharmacy consultation.  3. Tylenol liquid 650 mg per tube Q 4 hours PRN temp > 101.0 F,        [name],Paul Eugene 02/20/2017, 11:01 PM

## 2017-02-20 NOTE — Progress Notes (Signed)
Initial Nutrition Assessment  DOCUMENTATION CODES:  Not applicable  INTERVENTION:  If unable to extubate, would reccoemdn TF via OGT/NGT with Nepro at goal rate of 40 ml/h (960 ml per day) and Prostat 30 ml TID to provide 2028 kcals, 123 gm protein, 698 ml free water daily.  NUTRITION DIAGNOSIS:  Inadequate oral intake related to inability to eat as evidenced by NPO status.  GOAL:  Patient will meet greater than or equal to 90% of their needs  MONITOR:  Diet advancement, Vent status, Labs, Weight trends, I & O's  REASON FOR ASSESSMENT:  Ventilator    ASSESSMENT:  67 y/o male PMHx ESRD on HD, HTN/HLD, DM, COPD, GERD, tobacco abuse, recently hospitalized 12/12-12/26 for toxic metabolic encephalopathy r/t meds, possible sepsis and possible sleep disorder. Went to IR rehab from 12/26-1/7. Found at home w/ respiratory difficulty/AMS. EMS found BP 230-190. Intubated on arrival to ED due to hypertensive encephalopathy and resp failure w/ pulm edema.    Pt intubated, sedated, undergoing HD. No family, friends or any historians at bed side.   Per review of intake records for the last week he was in inpatient rehab, he generally ate quite well (60-100%)  Patient had left inpatient rehab at weight of 186 lbs. Given his pulmonary edema and missing of HD, will use this as dry weight.   Per RN, plan is to extubate tomorrow. Will leave recommendations in case unsuccessful   Patient is currently intubated on ventilator support MV: 9.4 L/min Temp (24hrs), Avg:99.4 F (37.4 C), Min:97.9 F (36.6 C), Max:100.2 F (37.9 C) Propofol: None  Labs: Na: 128, BG 70-120, phos/mag WDL,  Meds: Sensipar, PPI, IVF, Cardene,  Fentanyl  Recent Labs  Lab 02/15/17 1856 02/19/17 1040 02/20/17 0017  NA 130* 133* 128*  K 4.5 4.1 4.0  CL 93* 95* 94*  CO2 23 19* 17*  BUN 36* 45* 50*  CREATININE 10.22* 11.92* 12.31*  CALCIUM 10.6* 10.6* 9.2  MG  --  2.3 2.0  PHOS 4.2 3.1 3.7  GLUCOSE 83 <20* 71    NUTRITION - FOCUSED PHYSICAL EXAM:   Most Recent Value  Orbital Region  No depletion  Upper Arm Region  No depletion  Thoracic and Lumbar Region  No depletion  Buccal Region  No depletion  Temple Region  No depletion  Clavicle Bone Region  No depletion  Clavicle and Acromion Bone Region  No depletion  Scapular Bone Region  No depletion  Dorsal Hand  No depletion  Patellar Region  No depletion  Anterior Thigh Region  No depletion  Posterior Calf Region  No depletion  Edema (RD Assessment)  None      Diet Order:  Diet NPO time specified  EDUCATION NEEDS:  No education needs have been identified at this time  Skin:  Skin Assessment: Reviewed RN Assessment  Last BM:  Unknown  Height:  Ht Readings from Last 1 Encounters:  02/19/17 6\' 1"  (1.854 m)   Weight:  Wt Readings from Last 1 Encounters:  02/20/17 205 lb 0.4 oz (93 kg)   Wt Readings from Last 10 Encounters:  02/20/17 205 lb 0.4 oz (93 kg)  02/15/17 186 lb 1.1 oz (84.4 kg)  02/03/17 195 lb 8.8 oz (88.7 kg)  01/19/17 211 lb (95.7 kg)  12/28/16 211 lb (95.7 kg)  12/18/16 212 lb (96.2 kg)  12/15/16 211 lb (95.7 kg)  11/05/16 219 lb (99.3 kg)  10/23/16 219 lb (99.3 kg)  09/24/16 216 lb (98 kg)   Ideal  Body Weight:  83.63 kg  BMI:  Body mass index using dry weight is 24.6 kg/m.  Estimated Nutritional Needs:  Kcal:  2000 kcals (PSU 2003 B) Protein:  118-135 g (1.4-1.6 g/kg bw) Fluid:  Per MD  Burtis Junes RD, LDN, CNSC Clinical Nutrition Pager: [phone] 02/20/2017 5:10 PM

## 2017-02-20 NOTE — Progress Notes (Signed)
Arrived to patient room 51M-05 at 1630.  Reviewed treatment plan and this RN agrees with plan.  Report received from bedside RN, Demetria.  Consent verified.  Patient responds to pain.   Lung sounds diminished to ausculation in all fields. Generalized edema. Cardiac:  NSR.  Removed caps and cleansed RIJ catheter with chlorhedxidine.  Aspirated ports of heparin and flushed them with saline per protocol.  Connected and secured lines, initiated treatment at 1648.  UF Goal of 4500 mL and net fluid removal 4 L.  Will continue to monitor.

## 2017-02-21 LAB — COMPREHENSIVE METABOLIC PANEL
ALBUMIN: 3.1 g/dL — AB (ref 3.5–5.0)
ALT: 15 U/L — ABNORMAL LOW (ref 17–63)
ANION GAP: 18 — AB (ref 5–15)
AST: 33 U/L (ref 15–41)
Alkaline Phosphatase: 50 U/L (ref 38–126)
BILIRUBIN TOTAL: 0.8 mg/dL (ref 0.3–1.2)
BUN: 30 mg/dL — ABNORMAL HIGH (ref 6–20)
CO2: 19 mmol/L — ABNORMAL LOW (ref 22–32)
Calcium: 8.4 mg/dL — ABNORMAL LOW (ref 8.9–10.3)
Chloride: 93 mmol/L — ABNORMAL LOW (ref 101–111)
Creatinine, Ser: 9.49 mg/dL — ABNORMAL HIGH (ref 0.61–1.24)
GFR calc Af Amer: 6 mL/min — ABNORMAL LOW (ref >60)
GFR, EST NON AFRICAN AMERICAN: 5 mL/min — AB (ref >60)
Glucose, Bld: 185 mg/dL — ABNORMAL HIGH (ref 65–99)
POTASSIUM: 3.9 mmol/L (ref 3.5–5.1)
Sodium: 130 mmol/L — ABNORMAL LOW (ref 135–145)
TOTAL PROTEIN: 6.1 g/dL — AB (ref 6.5–8.1)

## 2017-02-21 LAB — GLUCOSE, CAPILLARY
GLUCOSE-CAPILLARY: 198 mg/dL — AB (ref 65–99)
GLUCOSE-CAPILLARY: 227 mg/dL — AB (ref 65–99)
GLUCOSE-CAPILLARY: 206 mg/dL — AB (ref 65–99)
Glucose-Capillary: 170 mg/dL — ABNORMAL HIGH (ref 65–99)
Glucose-Capillary: 181 mg/dL — ABNORMAL HIGH (ref 65–99)
Glucose-Capillary: 194 mg/dL — ABNORMAL HIGH (ref 65–99)
Glucose-Capillary: 202 mg/dL — ABNORMAL HIGH (ref 65–99)
Glucose-Capillary: 221 mg/dL — ABNORMAL HIGH (ref 65–99)
Glucose-Capillary: 223 mg/dL — ABNORMAL HIGH (ref 65–99)

## 2017-02-21 LAB — PTH, INTACT AND CALCIUM
CALCIUM TOTAL (PTH): 8.8 mg/dL (ref 8.6–10.2)
PTH: 64 pg/mL (ref 15–65)

## 2017-02-21 MED ORDER — INSULIN ASPART 100 UNIT/ML ~~LOC~~ SOLN
0.0000 [IU] | SUBCUTANEOUS | Status: DC
  Administered 2017-02-21: 2 [IU] via SUBCUTANEOUS
  Administered 2017-02-21: 3 [IU] via SUBCUTANEOUS
  Administered 2017-02-22 – 2017-02-25 (×5): 1 [IU] via SUBCUTANEOUS

## 2017-02-21 NOTE — Progress Notes (Addendum)
Subjective:  On vent, had HD last night w 4 L off    Objective Vital signs in last 24 hours: Vitals:   02/21/17 1100 02/21/17 1130 02/21/17 1200 02/21/17 1202  BP: (!) 149/77 135/74 (!) 159/99   Pulse: 88 89 99 98  Resp: 13 10 15 15   Temp: 100.2 F (37.9 C) (!) 100.4 F (38 C) 100.2 F (37.9 C)   TempSrc:      SpO2: 99% 100% 98% 99%  Weight:      Height:       Weight change: 2 kg (4 lb 6.5 oz)  Physical Exam: General:  intubated elderly [facility] ER  Obtunded  / not following commands  Heart: RRR; no murmur/ no galop or rub  Lungs: Intubated / CTAB Extremities: No LE edema, L leg in cast Dialysis Access: R AVF + thrill  Dialysis Orders:  MWF [facility]   4h 67mn  2/2 bath   400/800   85kg    Hep 8600  R AVF -Calcitriol 2.0 mcg PO TIW -Sensipar 120 mg PO TIW -Venofer 50 mg IV weekly -Mircera 60 mcg IV q 2 weeks (Last dose 02/12/17)    Assessment: 1. AMS - on vent, had hypoglycemia( BS 20) and HTN Crisis Initially  - rx per CCM  2. ESRDMWF HD, missed OP HD on 1/09. HD tomorrow 3. Recent L distal fibula fracture- s/p short leg cast and non-weight bearing PT 4. HTN/Vol: ^^ HTN Initially in ER, then BP's dropped quickly w intubation/ sedation, never needed Cardene as ordered. CXR normal here. Was 8kg up , now 3kg up after HD yest.  BP's stable today, no BP meds here, on metoprolol/ norvasc/ hydral at home.  5. Fever/ ?UTI - cult grew mult colonies, no UA sent, on empiric vanc/zosyn w fevers coming down 6. Anemia: Hgb 13.5  , no esa today  Fu hgb trend /  continue weekly Aranesp when hgb below 11/ weekly fe on hd  7. BMD: Phos ok, Cor Ca^^. Hold Calcitriol for now and follow. On PhosLo binder/Sensipar 120 mg on hd days can hold till pos  (will check PTH ,due on labs) 8. DM type 2 - per admit (noted on NO MEDS AT DC )  Plan - HD tomorrow in ICU, UF 3-4 L    RKelly SplinterMD CCrestwoodpager [phone]  02/21/2017, 12:38 PM     Labs: Basic Metabolic  Panel: Recent Labs  Lab 02/19/17 1040 02/20/17 0017 02/20/17 1642 02/21/17 0354  NA 133* 128* 125* 130*  K 4.1 4.0 4.3 3.9  CL 95* 94* 92* 93*  CO2 19* 17* 17* 19*  GLUCOSE <20* 71 104* 185*  BUN 45* 50* 53* 30*  CREATININE 11.92* 12.31* 13.01* 9.49*  CALCIUM 10.6* 9.2 8.7* 8.4*  PHOS 3.1 3.7 4.6  --    Liver Function Tests: Recent Labs  Lab 02/19/17 1040 02/20/17 1642 02/21/17 0354  AST 19  --  33  ALT 18  --  15*  ALKPHOS 64  --  50  BILITOT 0.7  --  0.8  PROT 7.7  --  6.1*  ALBUMIN 4.2 3.1* 3.1*   No results for input(s): LIPASE, AMYLASE in the last 168 hours. Recent Labs  Lab 02/19/17 1353  AMMONIA 42*   CBC: Recent Labs  Lab 02/15/17 1856 02/19/17 1040 02/20/17 0017 02/20/17 1642  WBC 7.6 12.1* 10.0 12.6*  NEUTROABS  --  11.2* 6.8  --   HGB 10.0* 13.3 10.8* 10.6*  HCT 30.5* 40.5 33.0* 32.8*  MCV 104.1* 103.8* 102.5* 103.1*  PLT 253 319 184 241   Cardiac Enzymes: Recent Labs  Lab 02/19/17 1040 02/19/17 1815 02/20/17 0017  TROPONINI 0.03* 0.03* 0.03*   CBG: Recent Labs  Lab 02/21/17 0010 02/21/17 0343 02/21/17 0618 02/21/17 0816 02/21/17 1051  GLUCAP 170* 198* 194* 227* 206*    Studies/Results: Dg Chest Port 1 View  Result Date: 02/20/2017 CLINICAL DATA:  Endotracheal tube present.  Respiratory failure. EXAM: PORTABLE CHEST 1 VIEW COMPARISON:  One-view chest x-ray 02/20/2016 FINDINGS: Infected tube is stable, 5 cm above the carina. A right IJ dialysis catheter is stable, terminating in the right atrium. NG tube has been placed. Side port is in the distal esophagus. Heart size is normal. Atherosclerotic calcifications are present at the arch. Lung volumes are low. There is no significant airspace consolidation. Mild pulmonary vascular congestion is stable. IMPRESSION: 1. Endotracheal tube and right IJ dialysis catheter are stable. 2. Oval placement of NG tube. The side port is in the esophagus. This could be advanced approximately 15 cm for  placement with the side port in the stomach. 3. Stable pulmonary vascular congestion. 4. No significant airspace disease. Electronically Signed   By: San Morelle M.D.   On: 02/20/2017 07:49   Dg Abd Portable 1 View  Result Date: 02/19/2017 CLINICAL DATA:  NG tube placement. EXAM: PORTABLE ABDOMEN - 1 VIEW COMPARISON:  None. FINDINGS: The tip of the NG tube is in the fundal region the stomach. The proximal port is in the distal esophagus. This could be advanced several cm. The bowel gas pattern is unremarkable. Surgical changes from prior hernia repair with mesh. Bilateral hip prosthesis are noted. There appears to be a temperature probe in the bladder. IMPRESSION: The NG tube tip is in the fundal region the stomach and the proximal port is in the distal esophagus. Electronically Signed   By: Marijo Sanes M.D.   On: 02/19/2017 12:58   Medications: . sodium chloride    . dextrose Stopped (02/21/17 0829)  . fentaNYL infusion INTRAVENOUS Stopped (02/21/17 0751)  . [START ON 02/24/2017] ferric gluconate (FERRLECIT/NULECIT) IV    . niCARDipine Stopped (02/19/17 1300)  . piperacillin-tazobactam (ZOSYN)  IV 3.375 g (02/21/17 1115)  . propofol (DIPRIVAN) infusion Stopped (02/20/17 0015)   . chlorhexidine gluconate (MEDLINE KIT)  15 mL Mouth Rinse BID  . Chlorhexidine Gluconate Cloth  6 each Topical Q0600  . [START ON 02/22/2017] cinacalcet  120 mg Oral Q M,W,F-HD  . heparin injection (subcutaneous)  5,000 Units Subcutaneous Q8H  . mouth rinse  15 mL Mouth Rinse QID  . mupirocin ointment  1 application Nasal BID  . pantoprazole (PROTONIX) IV  40 mg Intravenous QHS

## 2017-02-21 NOTE — Progress Notes (Signed)
eLink Physician-Brief Progress Note Patient Name: [name] DOB: [birth year] MRN: [id number]   Date of Service  02/21/2017  HPI/Events of Note  Blood glucose = 126 --> 170 --> 198.  eICU Interventions  Will decrease D10W IV infusion to 20 mL/hour.      Intervention Category Major Interventions: Hyperglycemia - active titration of insulin therapy  Sommer,Steven Eugene 02/21/2017, 6:04 AM

## 2017-02-21 NOTE — Progress Notes (Signed)
PULMONARY / CRITICAL CARE MEDICINE   Name: [name] MRN: [id number] DOB: [birth year]    ADMISSION DATE:  02/19/2017 CONSULTATION DATE: 02/19/2017  REFERRING MD: Emergency department physician  CHIEF COMPLAINT: Altered mental status  HISTORY OF PRESENT ILLNESS:   67 year old male with extensive past medical history including essential hypertension, end-stage renal disease, diabetes mellitus, anemia, chronic obstructive pulmonary disease noted to be still smoking by last evaluation and close fracture of his left tibia. He presents the emergency department [facility] 02/19/2017 after last being seen normal at 2 AM on 02/19/2017.  His daughter found him in the bed was to torus respirations EMS arrived and found her blood pressure be 230/190.  He was admitted to the emergency department and found to have a blood pressure of 200/170.  He was intubated placed on mechanical ventilatory support and is currently in the process to have a CT of the head for questionable intracerebral hemorrhage.  Note that he missed hemodialysis on Wednesday, 02/18/2017 and renal services will be notified of his admission.  With his hypertensive crisis his altered mental status may be from press.  We will address hypertension CT of the head to rule out intracerebral hemorrhage.  He will be admitted to the intensive care unit for further evaluation and treatment.  SUBJECTIVE:  Remains minimally responsive, however excellent tidal volumes on ventilator  VITAL SIGNS: BP 132/63   Pulse 82   Temp (!) 100.8 F (38.2 C)   Resp 10   Ht 6\' 1"  (1.854 m)   Wt 195 lb 1.7 oz (88.5 kg)   SpO2 97%   BMI 25.74 kg/m   HEMODYNAMICS:    VENTILATOR SETTINGS: Vent Mode: PSV;CPAP FiO2 (%):  [40 %] 40 % Set Rate:  [20 bmp] 20 bmp Vt Set:  [560 mL] 560 mL PEEP:  [5 cmH20] 5 cmH20 Pressure Support:  [5 cmH20] 5 cmH20 Plateau Pressure:  [16 cmH20-19 cmH20] 17 cmH20  INTAKE / OUTPUT: I/O last 3 completed shifts: In:  2313.6 [I.V.:2313.6] Out: 4100 [Urine:100; Other:4000]  PHYSICAL EXAMINATION: General: This is a well-developed 67 year old male, he remains encephalopathic and only opens eyes to voice. HEENT: Normocephalic atraumatic mucous membranes moist orally intubated. Pulmonary: Decreased, is having hiccups.  No accessory muscle use otherwise.  Currently on full ventilator support Cardiac: Regular rate and rhythm without murmur rub or gallop Abdomen: Soft nontender no organomegaly   extremities/musculoskeletal: Left lower leg cast placed, CMS intact, warm dry no significant edema Neuro: Opens eyes to voice, appears to start to be localizing.  No focal motor deficits, this exam is on fentanyl infusion.    LABS:  BMET Recent Labs  Lab 02/20/17 0017 02/20/17 1642 02/21/17 0354  NA 128* 125* 130*  K 4.0 4.3 3.9  CL 94* 92* 93*  CO2 17* 17* 19*  BUN 50* 53* 30*  CREATININE 12.31* 13.01* 9.49*  GLUCOSE 71 104* 185*    Electrolytes Recent Labs  Lab 02/19/17 1040 02/20/17 0017 02/20/17 1642 02/21/17 0354  CALCIUM 10.6* 9.2 8.7* 8.4*  MG 2.3 2.0  --   --   PHOS 3.1 3.7 4.6  --     CBC Recent Labs  Lab 02/19/17 1040 02/20/17 0017 02/20/17 1642  WBC 12.1* 10.0 12.6*  HGB 13.3 10.8* 10.6*  HCT 40.5 33.0* 32.8*  PLT 319 184 241    Coag's Recent Labs  Lab 02/19/17 1353  APTT 29  INR 0.99    Sepsis Markers Recent Labs  Lab 02/19/17 1040 02/19/17 1353  LATICACIDVEN  --  1.5  PROCALCITON 0.78  --     ABG Recent Labs  Lab 02/19/17 1238 02/19/17 1500 02/20/17 0535  PHART 7.267* 7.321* 7.357  PCO2ART 55.3* 45.4 36.4  PO2ART 323.0* 150.0* 130*    Liver Enzymes Recent Labs  Lab 02/19/17 1040 02/20/17 1642 02/21/17 0354  AST 19  --  33  ALT 18  --  15*  ALKPHOS 64  --  50  BILITOT 0.7  --  0.8  ALBUMIN 4.2 3.1* 3.1*    Cardiac Enzymes Recent Labs  Lab 02/19/17 1040 02/19/17 1815 02/20/17 0017  TROPONINI 0.03* 0.03* 0.03*    Glucose Recent Labs   Lab 02/20/17 1948 02/20/17 2155 02/21/17 0010 02/21/17 0343 02/21/17 0618 02/21/17 0816  GLUCAP 77 126* 170* 198* 194* 227*    Imaging No results found.   STUDIES:  02/19/2017 CT of the head>>  CULTURES:   ANTIBIOTICS:   SIGNIFICANT EVENTS: 02/19/2017 found with altered mental status and respiratory respirations and admitted to [facility]  LINES/TUBES: 02/19/2017 endotracheal tube>>  DISCUSSION: 67 year old male with extensive past medical history including essential hypertension, end-stage renal disease, diabetes mellitus, anemia, chronic obstructive pulmonary disease noted to be still smoking by last evaluation and close fracture of his left tibia. He presents the emergency department [facility] 02/19/2017 after last being seen normal at 2 AM on 02/19/2017.  He was intubated for airway protection, respiratory failure, and admitted to the intensive care.  Nephrology was consulted, he remains sedated today on fentanyl infusion, will discontinue that.  Compared to 1/12 his mental status is a little better hopefully off the fentanyl he will continue to improve.  For today we will cycle pressure support, and work towards spontaneous breathing trial if mental status will allow.  If he continues to be altered after fentanyl drip stopped we may need to consider repeat imaging, EEG, and perhaps neurology consult  ASSESSMENT / PLAN:  NEUROLOGIC/musculoskeletal A:   Acute encephalopathy: Likely hypertensive encephalopathy versus process such as press  Note he has a fractured left leg possible  Mental status a little better today compared with 1/12 following dialysis.  Apparently had a similar presentation before where his dialysis and had prolonged encephalopathy. P:   Discontinue fentanyl drip Supportive care RASS goal 0 Continue to check CMS of lower extremity   PULMONARY A: Vent dependent respiratory failure secondary to hypertensive crisis with pulmonary  edema Chest x-ray personally reviewed: Endotracheal tube in satisfactory position, dialysis catheter satisfactory.  It appears as though the orogastric tube could be advanced.  He has some mild vascular congestion Mental status a little improved P:   Discontinue sedating medications Pressure support ventilation Extubate when mental status allows  CARDIOVASCULAR A:  Hypertensive emergency, now resolved Chronic hypertension P:  Continue antihypertensives as needed  RENAL A:   End-stage renal disease Monday Wednesdays and Fridays.  Note he missed dialysis on Wednesday for unknown reason. Hyponatremia and metabolic acidosis -Last dialysis 1/12 P:   Nephrology following, follow-up a.m. chemistries  GASTROINTESTINAL A:   GI protection Constipation P:   Continue PPI Bowel protocol  HEMATOLOGIC A:   Anemia of chronic disease P:  To new subcutaneous heparin Trend CBC protocol Transfuse per protocol  INFECTIOUS A:   Fever, possible urinary tract infection Note he has a tunneled right IJ hemodialysis catheter as a possible source for infection. P:   Trend fever and white blood cell curve check procalcitonin Day #2 Vanco Zosyn Follow culture data  ENDOCRINE  A:   Prediabetes, now with hypoglycemia requiring D10, now hyperglycemic P:   Discontinue D10 Serial CBGs    FAMILY  - Updates: No family at the bedside  - Inter-disciplinary family meet or Palliative Care meeting due by:  day 7  My critical care times 30 minutes  Erick Colace ACNP-BC [place] Pager # [phone] OR # [phone] if no answer  Attending Note:  67 year old male with ESRD presenting with hypertensive encephalopathy.  Patient is weaning relatively well on exam, but mental status remains a concern.  I reviewed CXR myself, ETT is in good position.  Urine was concerning for an infection, begin broad spectrum abx.  F/u on cultures.  Dialysis as ordered and anticipate will  extubate in AM.  The patient is critically ill with multiple organ systems failure and requires high complexity decision making for assessment and support, frequent evaluation and titration of therapies, application of advanced monitoring technologies and extensive interpretation of multiple databases.   Critical Care Time devoted to patient care services described in this note is  35  Minutes. This time reflects time of care of this signee Dr Jennet Maduro. This critical care time does not reflect procedure time, or teaching time or supervisory time of PA/NP/Med student/Med Resident etc but could involve care discussion time.  Rush Farmer, M.D. [facility] Pulmonary/Critical Care Medicine. Pager: [phone]. After hours pager: [phone].

## 2017-02-22 ENCOUNTER — Inpatient Hospital Stay (HOSPITAL_COMMUNITY): Payer: Medicare Other

## 2017-02-22 DIAGNOSIS — N186 End stage renal disease: Secondary | ICD-10-CM

## 2017-02-22 DIAGNOSIS — T82898A Other specified complication of vascular prosthetic devices, implants and grafts, initial encounter: Secondary | ICD-10-CM

## 2017-02-22 DIAGNOSIS — I10 Essential (primary) hypertension: Secondary | ICD-10-CM

## 2017-02-22 DIAGNOSIS — Z992 Dependence on renal dialysis: Secondary | ICD-10-CM

## 2017-02-22 LAB — GLUCOSE, CAPILLARY
GLUCOSE-CAPILLARY: 104 mg/dL — AB (ref 65–99)
GLUCOSE-CAPILLARY: 108 mg/dL — AB (ref 65–99)
GLUCOSE-CAPILLARY: 123 mg/dL — AB (ref 65–99)
GLUCOSE-CAPILLARY: 123 mg/dL — AB (ref 65–99)
GLUCOSE-CAPILLARY: 128 mg/dL — AB (ref 65–99)
Glucose-Capillary: 127 mg/dL — ABNORMAL HIGH (ref 65–99)
Glucose-Capillary: 102 mg/dL — ABNORMAL HIGH (ref 65–99)

## 2017-02-22 LAB — BASIC METABOLIC PANEL
ANION GAP: 19 — AB (ref 5–15)
BUN: 46 mg/dL — ABNORMAL HIGH (ref 6–20)
CO2: 19 mmol/L — ABNORMAL LOW (ref 22–32)
Calcium: 8.7 mg/dL — ABNORMAL LOW (ref 8.9–10.3)
Chloride: 92 mmol/L — ABNORMAL LOW (ref 101–111)
Creatinine, Ser: 11.69 mg/dL — ABNORMAL HIGH (ref 0.61–1.24)
GFR calc Af Amer: 4 mL/min — ABNORMAL LOW (ref >60)
GFR, EST NON AFRICAN AMERICAN: 4 mL/min — AB (ref >60)
Glucose, Bld: 139 mg/dL — ABNORMAL HIGH (ref 65–99)
POTASSIUM: 3.6 mmol/L (ref 3.5–5.1)
SODIUM: 130 mmol/L — AB (ref 135–145)

## 2017-02-22 LAB — CBC
HEMATOCRIT: 34.6 % — AB (ref 39.0–52.0)
Hemoglobin: 11.5 g/dL — ABNORMAL LOW (ref 13.0–17.0)
MCH: 34.4 pg — ABNORMAL HIGH (ref 26.0–34.0)
MCHC: 33.2 g/dL (ref 30.0–36.0)
MCV: 103.6 fL — ABNORMAL HIGH (ref 78.0–100.0)
Platelets: 236 10*3/uL (ref 150–400)
RBC: 3.34 MIL/uL — AB (ref 4.22–5.81)
RDW: 14.5 % (ref 11.5–15.5)
WBC: 17.2 10*3/uL — AB (ref 4.0–10.5)

## 2017-02-22 LAB — MAGNESIUM: Magnesium: 1.9 mg/dL (ref 1.7–2.4)

## 2017-02-22 LAB — PHOSPHORUS: PHOSPHORUS: 6.4 mg/dL — AB (ref 2.5–4.6)

## 2017-02-22 MED ORDER — HEPARIN SODIUM (PORCINE) 1000 UNIT/ML DIALYSIS
8000.0000 [IU] | Freq: Once | INTRAMUSCULAR | Status: AC
  Administered 2017-02-22: 8000 [IU] via INTRAVENOUS_CENTRAL

## 2017-02-22 MED ORDER — CLONIDINE HCL 0.1 MG/24HR TD PTWK
0.1000 mg | MEDICATED_PATCH | TRANSDERMAL | Status: DC
  Administered 2017-02-22: 0.1 mg via TRANSDERMAL
  Filled 2017-02-22: qty 1

## 2017-02-22 MED ORDER — METOPROLOL TARTRATE 5 MG/5ML IV SOLN
5.0000 mg | Freq: Four times a day (QID) | INTRAVENOUS | Status: DC | PRN
  Administered 2017-02-22: 2.5 mg via INTRAVENOUS
  Filled 2017-02-22: qty 5

## 2017-02-22 MED ORDER — HEPARIN SOD (PORK) LOCK FLUSH 100 UNIT/ML IV SOLN: 8000.0000 [IU] | Freq: Once | INTRAVENOUS | Status: AC

## 2017-02-22 MED ORDER — DOCUSATE SODIUM 50 MG/5ML PO LIQD
100.0000 mg | Freq: Two times a day (BID) | ORAL | Status: DC
Start: 2017-02-22 — End: 2017-02-23
  Filled 2017-02-22: qty 10

## 2017-02-22 MED ORDER — VANCOMYCIN HCL IN DEXTROSE 1-5 GM/200ML-% IV SOLN
1000.0000 mg | INTRAVENOUS | Status: DC
  Administered 2017-02-22: 1000 mg via INTRAVENOUS
  Filled 2017-02-22: qty 200

## 2017-02-22 MED ORDER — MAGNESIUM HYDROXIDE 400 MG/5ML PO SUSP: 30.0000 mL | Freq: Once | ORAL | Status: DC

## 2017-02-22 MED ORDER — SODIUM CHLORIDE 0.9 % IV SOLN
12.5000 mg | Freq: Once | INTRAVENOUS | Status: AC
  Administered 2017-02-22: 12.5 mg via INTRAVENOUS
  Filled 2017-02-22: qty 0.5

## 2017-02-22 MED ORDER — RENA-VITE PO TABS
1.0000 | ORAL_TABLET | Freq: Every day | ORAL | Status: DC
  Administered 2017-02-24 – 2017-02-25 (×2): 1 via ORAL
  Filled 2017-02-22 (×4): qty 1

## 2017-02-22 MED ORDER — FENTANYL CITRATE (PF) 100 MCG/2ML IJ SOLN: 50.0000 ug | INTRAMUSCULAR | Status: DC | PRN

## 2017-02-22 NOTE — Progress Notes (Signed)
Patient went into SVT for a brief moment. HD nurse decreased amount she was pulling off. CCM aware, EKG ordered and metoprolol PRN if it occurs again. Will continue to monitor. Modena Morrow E, RN 02/22/2017 1:31 PM

## 2017-02-22 NOTE — Progress Notes (Signed)
Attempted three times for an orogastric tube and three times for a nasogastric tube with multiple nurses. Patient only needed for tube feeds. CCM confidant that patient will extubate tomorrow. Will continue to monitor. Bartholomew Crews, RN 02/22/2017 4:31 PM

## 2017-02-22 NOTE — Progress Notes (Signed)
Subjective: Interval History: has no complaint, shakes my hand, obeys commands.  Objective: Vital signs in last 24 hours: Temp:  [99.5 F (37.5 C)-100.9 F (38.3 C)] 99.5 F (37.5 C) (01/14 0600) Pulse Rate:  [77-111] 81 (01/14 0600) Resp:  [10-29] 19 (01/14 0600) BP: (102-182)/(60-107) 147/75 (01/14 0600) SpO2:  [97 %-100 %] 100 % (01/14 0600) FiO2 (%):  [40 %] 40 % (01/14 0400) Weight:  [87.8 kg (193 lb 9 oz)] 87.8 kg (193 lb 9 oz) (01/14 0500) Weight change: -5.2 kg (-7.4 oz)  Intake/Output from previous day: 01/13 0701 - 01/14 0700 In: 138.2 [I.V.:38.2; IV Piggyback:100] Out: 70 [Urine:70] Intake/Output this shift: No intake/output data recorded.  General appearance: alert, cooperative and on vent Resp: rhonchi bibasilar Chest wall: RIJ cath Cardio: S1, S2 normal and systolic murmur: systolic ejection 2/6, decrescendo at 2nd left intercostal space GI: soft, pos bs, liver down 5 cm Extremities: avf RUA B&T, 1+ edema  Lab Results: Recent Labs    02/20/17 1642 02/22/17 0547  WBC 12.6* 17.2*  HGB 10.6* 11.5*  HCT 32.8* 34.6*  PLT 241 236   BMET:  Recent Labs    02/21/17 0354 02/22/17 0547  NA 130* 130*  K 3.9 3.6  CL 93* 92*  CO2 19* 19*  GLUCOSE 185* 139*  BUN 30* 46*  CREATININE 9.49* 11.69*  CALCIUM 8.4* 8.7*   Recent Labs    02/20/17 1642  PTH 64  Comment   Iron Studies: No results for input(s): IRON, TIBC, TRANSFERRIN, FERRITIN in the last 72 hours.  Studies/Results: No results found.  I have reviewed the patient's current medications.  Assessment/Plan: 1 ESRD for HD, vol xs.  Acidemia.  AVF needs addressed, ? shuntogram , had missed F/U 2 HTN lower vol, cont meds 3 Anemia stable 4 HPTH 5 DM BS better 6 Ankle Fx 7 VDRF per CCM 8 Nonadherence P HD, d/c foley, VVS to see regarding AVF, cont AB    LOS: 3 days   Jeneen Rinks Kiyra Slaubaugh 02/22/2017,7:42 AM
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
## 2017-02-22 NOTE — Progress Notes (Signed)
PULMONARY / CRITICAL CARE MEDICINE   Name: [name] MRN: [id number] DOB: [birth year]    ADMISSION DATE:  02/19/2017 CONSULTATION DATE: 02/19/2017  REFERRING MD: Emergency department physician  CHIEF COMPLAINT: Altered mental status  HISTORY OF PRESENT ILLNESS:   67 year old male with extensive past medical history including essential hypertension, end-stage renal disease, diabetes mellitus, anemia, chronic obstructive pulmonary disease noted to be still smoking by last evaluation and close fracture of his left tibia. He presents the emergency department [facility] 02/19/2017 after last being seen normal at 2 AM on 02/19/2017.  His daughter found him in the bed and EMS found her blood pressure be 230/190.  He was admitted to the emergency department and found to have a blood pressure of 200/170.  He was intubated placed on mechanical ventilatory support and is currently in the process to have a CT of the head for questionable intracerebral hemorrhage.  Note that he missed hemodialysis on Wednesday, 02/18/2017 and renal services were notified of his admission.  With his hypertensive crisis his altered mental status may be from press.  We will address hypertension CT of the head to rule out intracerebral hemorrhage.  He will be admitted to the intensive care unit for further evaluation and treatment.  SUBJECTIVE:  Remains minimally responsive,  Urine yesterday was concerning for infection- abx were ordered, culture is in process but prel shows gram neg rods  Had T max of 100.6   VITAL SIGNS: BP (!) 160/82   Pulse 71   Temp 99.5 F (37.5 C)   Resp (!) 23   Ht 6\' 1"  (1.854 m)   Wt 193 lb 9 oz (87.8 kg)   SpO2 100%   BMI 25.54 kg/m   HEMODYNAMICS:    VENTILATOR SETTINGS: Vent Mode: PRVC FiO2 (%):  [40 %] 40 % Set Rate:  [20 bmp] 20 bmp Vt Set:  [560 mL] 560 mL PEEP:  [5 cmH20] 5 cmH20 Pressure Support:  [5 cmH20] 5 cmH20 Plateau Pressure:  [16 cmH20-18 cmH20] 16  cmH20  INTAKE / OUTPUT: I/O last 3 completed shifts: In: 163 [I.V.:622; IV Piggyback:100] Out: 8466 [Urine:70; Other:4000]  PHYSICAL EXAMINATION: General: This is a well-developed 67 year old male, he remains encephalopathic and only opens eyes to voice. HEENT: Normocephalic atraumatic mucous membranes moist orally intubated. Pulmonary: currently on full ventilator support Cardiac: Regular rate and rhythm , and systolic murmur heard Abdomen: Soft nontender no organomegaly   extremities/musculoskeletal: Left lower leg cast placed, CMS intact, warm dry no significant edema Neuro: Opens eyes to voice LABS:  BMET Recent Labs  Lab 02/20/17 1642 02/21/17 0354 02/22/17 0547  NA 125* 130* 130*  K 4.3 3.9 3.6  CL 92* 93* 92*  CO2 17* 19* 19*  BUN 53* 30* 46*  CREATININE 13.01* 9.49* 11.69*  GLUCOSE 104* 185* 139*    Electrolytes Recent Labs  Lab 02/19/17 1040 02/20/17 0017 02/20/17 1642 02/21/17 0354 02/22/17 0547  CALCIUM 10.6* 9.2 8.7*  8.8 8.4* 8.7*  MG 2.3 2.0  --   --  1.9  PHOS 3.1 3.7 4.6  --  6.4*    CBC Recent Labs  Lab 02/20/17 0017 02/20/17 1642 02/22/17 0547  WBC 10.0 12.6* 17.2*  HGB 10.8* 10.6* 11.5*  HCT 33.0* 32.8* 34.6*  PLT 184 241 236    Coag's Recent Labs  Lab 02/19/17 1353  APTT 29  INR 0.99    Sepsis Markers Recent Labs  Lab 02/19/17 1040 02/19/17 1353  LATICACIDVEN  --  1.5  PROCALCITON 0.78  --     ABG Recent Labs  Lab 02/19/17 1238 02/19/17 1500 02/20/17 0535  PHART 7.267* 7.321* 7.357  PCO2ART 55.3* 45.4 36.4  PO2ART 323.0* 150.0* 130*    Liver Enzymes Recent Labs  Lab 02/19/17 1040 02/20/17 1642 02/21/17 0354  AST 19  --  33  ALT 18  --  15*  ALKPHOS 64  --  50  BILITOT 0.7  --  0.8  ALBUMIN 4.2 3.1* 3.1*    Cardiac Enzymes Recent Labs  Lab 02/19/17 1040 02/19/17 1815 02/20/17 0017  TROPONINI 0.03* 0.03* 0.03*    Glucose Recent Labs  Lab 02/21/17 1409 02/21/17 1634 02/21/17 2004  02/22/17 0003 02/22/17 0358 02/22/17 0734  GLUCAP 223* 202* 181* 104* 127* 123*    Imaging No results found.   STUDIES:  02/19/2017 CT of the head>>  CULTURES: Blood culture 1/11 > Urine culture 1/11 >   ANTIBIOTICS:   SIGNIFICANT EVENTS: 02/19/2017 found with altered mental status and respiratory respirations and admitted to [facility]  LINES/TUBES: 02/19/2017 endotracheal tube>>  DISCUSSION: 67 year old male with extensive past medical history including essential hypertension, end-stage renal disease, diabetes mellitus, anemia, chronic obstructive pulmonary disease noted to be still smoking by last evaluation and close fracture of his left tibia. He presents the emergency department [facility] 02/19/2017 after last being seen normal at 2 AM on 02/19/2017.  He was intubated for airway protection, respiratory failure, and admitted to the intensive care.  Nephrology was consulted, he remains sedated today on fentanyl infusion, will discontinue that.  Compared to 1/12 his mental status is a little better hopefully off the fentanyl he will continue to improve.  For today we will cycle pressure support, and work towards spontaneous breathing trial if mental status will allow.  If he continues to be altered after fentanyl drip stopped we may need to consider repeat imaging, EEG, and perhaps neurology consult  ASSESSMENT / PLAN:  NEUROLOGIC/musculoskeletal A:   Acute encephalopathy: Likely hypertensive encephalopathy versus process such as press  Note he has a fractured left leg possible  He opens eyes to commands.   P:   D/C all sedation Supportive care RASS goal 0 Continue to check CMS of lower extremity   PULMONARY A: Vent dependent respiratory failure secondary to hypertensive crisis with pulmonary edema  Mental status a little improved P:   On fentanyl PRN  Pressure support ventilation Extubate when mental status allows -can order repeat CXR    CARDIOVASCULAR A:  Hypertensive emergency, now resolved Chronic hypertension P:  Continue antihypertensives as needed  RENAL A:   End-stage renal disease Monday Wednesdays and Fridays.  Note he missed dialysis on Wednesday for unknown reason. Hyponatremia and metabolic acidosis -Last dialysis 1/12 P:   Nephrology following, dialysis plan today   GASTROINTESTINAL A:   GI protection Constipation P:   Continue PPI Bowel protocol  HEMATOLOGIC A:   Anemia of chronic disease P:  To new subcutaneous heparin Trend CBC protocol Transfuse per protocol  INFECTIOUS A:   Fever, possible urinary tract infection Note he has a tunneled right IJ hemodialysis catheter as a possible source for infection. P:   Trend fever and white blood cell curve check procalcitonin -currently on vanc and zosyn  Follow culture data- prel urine culture shows gramnegative rods   ENDOCRINE A:   Prediabetes, hadhypoglycemia requiring D10, now normoglycemic. CBG is normoglycemic.  P:   -continued D10 at 20 ml/hr  Serial CBGs  FAMILY  - Updates:  No family at the bedside  - Inter-disciplinary family meet or Palliative Care meeting due by:  day 7  My critical care times 30 minutes   Burgess Estelle MD IMTS  Attending Note:  67 year old male with PMH of ESRD who came in after missing dialysis was severely hypertensive and was intubated for respiratory failure.  Had his first dialysis yesterday and electrolytes appear better.  On exam, has hiccups and triggering the vent with clear lungs but not following commands.  I reviewed CXR myself, NAD noted.  Discussed with PCCM-NP.  Will continue full vent support for now.  After dialysis if patient continues to not follow commands then will place OGT, start TF, colace and MOM.  If more responsive then will consider extubation.  Thorazine for hiccups.  PCCM will follow in the afternoon.  The patient is critically ill with multiple organ systems failure  and requires high complexity decision making for assessment and support, frequent evaluation and titration of therapies, application of advanced monitoring technologies and extensive interpretation of multiple databases.   Critical Care Time devoted to patient care services described in this note is  35  Minutes. This time reflects time of care of this signee Dr Jennet Maduro. This critical care time does not reflect procedure time, or teaching time or supervisory time of PA/NP/Med student/Med Resident etc but could involve care discussion time.  Rush Farmer, M.D. [facility] Pulmonary/Critical Care Medicine. Pager: [phone]. After hours pager: [phone].

## 2017-02-22 NOTE — Telephone Encounter (Signed)
Left a VM with physical therapist to please interview the patient at next visit to determine patient's willingness to try softeners, laxatives, etc..Marland KitchenMarland Kitchen

## 2017-02-23 ENCOUNTER — Inpatient Hospital Stay (HOSPITAL_COMMUNITY): Payer: Medicare Other

## 2017-02-23 ENCOUNTER — Other Ambulatory Visit: Payer: Self-pay

## 2017-02-23 DIAGNOSIS — N186 End stage renal disease: Secondary | ICD-10-CM

## 2017-02-23 DIAGNOSIS — J9690 Respiratory failure, unspecified, unspecified whether with hypoxia or hypercapnia: Secondary | ICD-10-CM

## 2017-02-23 LAB — CBC
HCT: 34.7 % — ABNORMAL LOW (ref 39.0–52.0)
HEMOGLOBIN: 11.6 g/dL — AB (ref 13.0–17.0)
MCH: 34.1 pg — ABNORMAL HIGH (ref 26.0–34.0)
MCHC: 33.4 g/dL (ref 30.0–36.0)
MCV: 102.1 fL — ABNORMAL HIGH (ref 78.0–100.0)
PLATELETS: 294 10*3/uL (ref 150–400)
RBC: 3.4 MIL/uL — AB (ref 4.22–5.81)
RDW: 14.3 % (ref 11.5–15.5)
WBC: 17.5 10*3/uL — AB (ref 4.0–10.5)

## 2017-02-23 LAB — BASIC METABOLIC PANEL
ANION GAP: 20 — AB (ref 5–15)
BUN: 31 mg/dL — ABNORMAL HIGH (ref 6–20)
CHLORIDE: 95 mmol/L — AB (ref 101–111)
CO2: 19 mmol/L — ABNORMAL LOW (ref 22–32)
Calcium: 9.2 mg/dL (ref 8.9–10.3)
Creatinine, Ser: 8.31 mg/dL — ABNORMAL HIGH (ref 0.61–1.24)
GFR calc Af Amer: 7 mL/min — ABNORMAL LOW (ref >60)
GFR, EST NON AFRICAN AMERICAN: 6 mL/min — AB (ref >60)
Glucose, Bld: 129 mg/dL — ABNORMAL HIGH (ref 65–99)
POTASSIUM: 3.8 mmol/L (ref 3.5–5.1)
SODIUM: 134 mmol/L — AB (ref 135–145)

## 2017-02-23 LAB — BLOOD GAS, ARTERIAL
ACID-BASE DEFICIT: 2.5 mmol/L — AB (ref 0.0–2.0)
BICARBONATE: 21.8 mmol/L (ref 20.0–28.0)
Drawn by: 41977
FIO2: 40
LHR: 20 {breaths}/min
O2 Saturation: 97.4 %
PEEP: 5 cmH2O
PO2 ART: 107 mmHg (ref 83.0–108.0)
Patient temperature: 98.6
VT: 560 mL
pCO2 arterial: 37.2 mmHg (ref 32.0–48.0)
pH, Arterial: 7.385 (ref 7.350–7.450)

## 2017-02-23 LAB — URINE CULTURE

## 2017-02-23 LAB — GLUCOSE, CAPILLARY
GLUCOSE-CAPILLARY: 111 mg/dL — AB (ref 65–99)
GLUCOSE-CAPILLARY: 115 mg/dL — AB (ref 65–99)
GLUCOSE-CAPILLARY: 87 mg/dL (ref 65–99)
GLUCOSE-CAPILLARY: 93 mg/dL (ref 65–99)
Glucose-Capillary: 113 mg/dL — ABNORMAL HIGH (ref 65–99)
Glucose-Capillary: 99 mg/dL (ref 65–99)

## 2017-02-23 LAB — PHOSPHORUS: PHOSPHORUS: 5.5 mg/dL — AB (ref 2.5–4.6)

## 2017-02-23 LAB — MAGNESIUM: MAGNESIUM: 2 mg/dL (ref 1.7–2.4)

## 2017-02-23 MED ORDER — SODIUM CHLORIDE 0.9 % IV SOLN
12.5000 mg | Freq: Once | INTRAVENOUS | Status: AC
  Administered 2017-02-23: 12.5 mg via INTRAVENOUS
  Filled 2017-02-23: qty 0.5

## 2017-02-23 MED ORDER — FENTANYL CITRATE (PF) 100 MCG/2ML IJ SOLN
INTRAMUSCULAR | Status: AC
  Filled 2017-02-23: qty 4

## 2017-02-23 MED ORDER — MIDAZOLAM HCL 2 MG/2ML IJ SOLN
INTRAMUSCULAR | Status: AC
  Filled 2017-02-23: qty 4

## 2017-02-23 MED ORDER — DOCUSATE SODIUM 100 MG PO CAPS
100.0000 mg | ORAL_CAPSULE | Freq: Two times a day (BID) | ORAL | Status: DC
  Administered 2017-02-24 – 2017-02-26 (×3): 100 mg via ORAL
  Filled 2017-02-23 (×6): qty 1

## 2017-02-23 MED ORDER — MAGNESIUM HYDROXIDE 400 MG/5ML PO SUSP: 30.0000 mL | Freq: Once | ORAL | Status: DC

## 2017-02-23 MED ORDER — CEFAZOLIN SODIUM-DEXTROSE 1-4 GM/50ML-% IV SOLN
1.0000 g | INTRAVENOUS | Status: AC
  Administered 2017-02-23 – 2017-02-25 (×3): 1 g via INTRAVENOUS
  Filled 2017-02-23 (×4): qty 50

## 2017-02-23 MED ORDER — CEFAZOLIN SODIUM-DEXTROSE 2-4 GM/100ML-% IV SOLN
2.0000 g | Freq: Two times a day (BID) | INTRAVENOUS | Status: DC
  Filled 2017-02-23: qty 100

## 2017-02-23 NOTE — Progress Notes (Signed)
eLink Physician-Brief Progress Note Patient Name: [name] DOB: [birth year] MRN: [id number]   Date of Service  02/23/2017  HPI/Events of Note  Agitation - Request to renew restraint orders.   eICU Interventions  Will renew restraint orders.      Intervention Category Minor Interventions: Agitation / anxiety - evaluation and management  Sommer,Steven Eugene 02/23/2017, 4:52 AM

## 2017-02-23 NOTE — Procedures (Signed)
Extubation Procedure Note  Patient Details:   Name: [name] DOB: July 21, 1950 MRN: [id number]       Evaluation  O2 sats: stable throughout Complications: No apparent complications Patient did tolerate procedure well. Bilateral Breath Sounds: Clear, Diminished   Pt extubated per MD order.  Pt placed on 4L [place] tolerating well, no distress noted at this time.  Pt had + cuff leak.   Ciro Backer 02/23/2017, 11:22 AM

## 2017-02-23 NOTE — Progress Notes (Signed)
Subjective: Interval History: has no complaint, on vent . Marland Kitchen  Objective: Vital signs in last 24 hours: Temp:  [98.8 F (37.1 C)-99.7 F (37.6 C)] 99.7 F (37.6 C) (01/15 0357) Pulse Rate:  [71-123] 99 (01/15 0600) Resp:  [15-25] 22 (01/15 0600) BP: (88-154)/(57-104) 135/87 (01/15 0600) SpO2:  [89 %-100 %] 100 % (01/15 0600) FiO2 (%):  [40 %] 40 % (01/15 0400) Weight:  [86 kg (189 lb 9.5 oz)-87.8 kg (193 lb 9 oz)] 86 kg (189 lb 9.5 oz) (01/14 1522) Weight change: 0 kg (0 lb)  Intake/Output from previous day: 01/14 0701 - 01/15 0700 In: 415 [IV Piggyback:325] Out: 2865 [Urine:15] Intake/Output this shift: No intake/output data recorded.  General appearance: mildly obese, slowed mentation and not coop, lethargic but moves all extrem Resp: rales bibasilar and rhonchi bibasilar Cardio: S1, S2 normal and systolic murmur: holosystolic 2/6, blowing at apex GI: soft, non-tender; bowel sounds normal; no masses,  no organomegaly Extremities: AVF RUA, PC R IJ  Lab Results: Recent Labs    02/22/17 0547 02/23/17 0313  WBC 17.2* 17.5*  HGB 11.5* 11.6*  HCT 34.6* 34.7*  PLT 236 294   BMET:  Recent Labs    02/22/17 0547 02/23/17 0313  NA 130* 134*  K 3.6 3.8  CL 92* 95*  CO2 19* 19*  GLUCOSE 139* 129*  BUN 46* 31*  CREATININE 11.69* 8.31*  CALCIUM 8.7* 9.2   Recent Labs    02/20/17 1642  PTH 64  Comment   Iron Studies: No results for input(s): IRON, TIBC, TRANSFERRIN, FERRITIN in the last 72 hours.  Studies/Results: Dg Chest Port 1 View  Result Date: 02/23/2017 CLINICAL DATA:  Intubation. EXAM: PORTABLE CHEST 1 VIEW COMPARISON:  02/20/2017.  02/19/2017. FINDINGS: Interim removal of NG tube. Endotracheal tube and right IJ sheath in stable position. Heart size normal. No focal infiltrate. Right costophrenic angle not imaged. No pneumothorax. IMPRESSION: 1.  Interim removal of NG tube. 2. Remaining lines and tubes in stable position. No acute cardiopulmonary disease  identified. Electronically Signed   By: Marcello Moores  Register   On: 02/23/2017 07:10    I have reviewed the patient's current medications.  Assessment/Plan: 1 ESRD vol better. Solute ok. HD yest 2 Anemia stable 3 HPTH  4 HTN better, stop clonidine 5 DM controlled 6 VDRF per CCM 7 COPD 8 NONADHERNCE P HD tomorrow, stop clonidine, vent per CCM    LOS: 4 days   Jeneen Rinks Valisa Karpel 02/23/2017,7:28 AM
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
## 2017-02-23 NOTE — Progress Notes (Signed)
PULMONARY / CRITICAL CARE MEDICINE   Name: [name] MRN: [id number] DOB: [birth year]    ADMISSION DATE:  02/19/2017 CONSULTATION DATE: 02/19/2017  REFERRING MD: Emergency department physician  CHIEF COMPLAINT: Altered mental status  HISTORY OF PRESENT ILLNESS:   67 year old male with extensive past medical history including essential hypertension, end-stage renal disease, diabetes mellitus, anemia, chronic obstructive pulmonary disease noted to be still smoking by last evaluation and close fracture of his left tibia. He presents the emergency department [facility] 02/19/2017 after last being seen normal at 2 AM on 02/19/2017.  His daughter found him in the bed and EMS found her blood pressure be 230/190.  He was admitted to the emergency department and found to have a blood pressure of 200/170.  He was intubated placed on mechanical ventilatory support and is currently in the process to have a CT of the head for questionable intracerebral hemorrhage.  Note that he missed hemodialysis on Wednesday, 02/18/2017 and renal services were notified of his admission.  With his hypertensive crisis his altered mental status may be from press.  We will address hypertension CT of the head to rule out intracerebral hemorrhage.  He will be admitted to the intensive care unit for further evaluation and treatment.  SUBJECTIVE:  Pt still not following any commands.  Urine culture grew Klebsiella   VITAL SIGNS: BP 110/73   Pulse (!) 101   Temp 99 F (37.2 C) (Oral)   Resp 14   Ht 6\' 1"  (1.854 m)   Wt 189 lb 9.5 oz (86 kg)   SpO2 98%   BMI 25.01 kg/m   HEMODYNAMICS:    VENTILATOR SETTINGS: Vent Mode: PSV;CPAP FiO2 (%):  [40 %] 40 % Set Rate:  [20 bmp] 20 bmp Vt Set:  [560 mL] 560 mL PEEP:  [5 cmH20] 5 cmH20 Pressure Support:  [5 cmH20] 5 cmH20 Plateau Pressure:  [15 cmH20-19 cmH20] 18 cmH20  INTAKE / OUTPUT: I/O last 3 completed shifts: In: 16 [Other:90; IV Piggyback:325] Out: 2885  [Urine:35; Other:2850]  PHYSICAL EXAMINATION: General: This is a well-developed 67 year old male, he remains encephalopathic and only opens eyes to voice. HEENT: Normocephalic atraumatic mucous membranes moist orally intubated. Pulmonary: currently on full ventilator support Cardiac: Regular rate and rhythm , and systolic murmur heard Abdomen: Soft nontender no organomegaly   extremities/musculoskeletal: Left lower leg cast placed, CMS intact, warm dry no significant edema Neuro: Opens eyes to voice  LABS:  BMET Recent Labs  Lab 02/21/17 0354 02/22/17 0547 02/23/17 0313  NA 130* 130* 134*  K 3.9 3.6 3.8  CL 93* 92* 95*  CO2 19* 19* 19*  BUN 30* 46* 31*  CREATININE 9.49* 11.69* 8.31*  GLUCOSE 185* 139* 129*    Electrolytes Recent Labs  Lab 02/20/17 0017 02/20/17 1642 02/21/17 0354 02/22/17 0547 02/23/17 0313  CALCIUM 9.2 8.7*  8.8 8.4* 8.7* 9.2  MG 2.0  --   --  1.9 2.0  PHOS 3.7 4.6  --  6.4* 5.5*    CBC Recent Labs  Lab 02/20/17 1642 02/22/17 0547 02/23/17 0313  WBC 12.6* 17.2* 17.5*  HGB 10.6* 11.5* 11.6*  HCT 32.8* 34.6* 34.7*  PLT 241 236 294    Coag's Recent Labs  Lab 02/19/17 1353  APTT 29  INR 0.99    Sepsis Markers Recent Labs  Lab 02/19/17 1040 02/19/17 1353  LATICACIDVEN  --  1.5  PROCALCITON 0.78  --     ABG Recent Labs  Lab 02/19/17 1500 02/20/17  0535 02/23/17 0315  PHART 7.321* 7.357 7.385  PCO2ART 45.4 36.4 37.2  PO2ART 150.0* 130* 107    Liver Enzymes Recent Labs  Lab 02/19/17 1040 02/20/17 1642 02/21/17 0354  AST 19  --  33  ALT 18  --  15*  ALKPHOS 64  --  50  BILITOT 0.7  --  0.8  ALBUMIN 4.2 3.1* 3.1*    Cardiac Enzymes Recent Labs  Lab 02/19/17 1040 02/19/17 1815 02/20/17 0017  TROPONINI 0.03* 0.03* 0.03*    Glucose Recent Labs  Lab 02/22/17 1108 02/22/17 1501 02/22/17 1937 02/22/17 2357 02/23/17 0356 02/23/17 0801  GLUCAP 123* 102* 128* 108* 113* 115*    Imaging Dg Chest Port 1  View  Result Date: 02/23/2017 CLINICAL DATA:  Intubation. EXAM: PORTABLE CHEST 1 VIEW COMPARISON:  02/20/2017.  02/19/2017. FINDINGS: Interim removal of NG tube. Endotracheal tube and right IJ sheath in stable position. Heart size normal. No focal infiltrate. Right costophrenic angle not imaged. No pneumothorax. IMPRESSION: 1.  Interim removal of NG tube. 2. Remaining lines and tubes in stable position. No acute cardiopulmonary disease identified. Electronically Signed   By: Marcello Moores  Register   On: 02/23/2017 07:10   STUDIES:  02/19/2017 CT of the head>>  CULTURES: Blood culture 1/11 > Urine culture 1/11 > klebsiella   ANTIBIOTICS: Ancef 1/15>>> Vancomycin 1/13>>>1/15 Zosyn 1/13>>>1/15  SIGNIFICANT EVENTS: 02/19/2017 found with altered mental status and respiratory respirations and admitted to [facility]  LINES/TUBES: 02/19/2017 endotracheal tube>>  DISCUSSION: 67 year old male with extensive past medical history including essential hypertension, end-stage renal disease, diabetes mellitus, anemia, chronic obstructive pulmonary disease noted to be still smoking by last evaluation and close fracture of his left tibia. He presents the emergency department [facility] 02/19/2017 after last being seen normal at 2 AM on 02/19/2017.  He was intubated for airway protection, respiratory failure, and admitted to the intensive care.  Nephrology was consulted, he remains sedated today on fentanyl infusion, will discontinue that.  Compared to 1/12 his mental status is a little better hopefully off the fentanyl he will continue to improve.  For today we will cycle pressure support, and work towards spontaneous breathing trial if mental status will allow.  If he continues to be altered after fentanyl drip stopped we may need to consider repeat imaging, EEG, and perhaps neurology consult  ASSESSMENT / PLAN:  NEUROLOGIC/musculoskeletal A:   Acute encephalopathy: Likely hypertensive encephalopathy  versus process such as press  Note he has a fractured left leg possible  He opens eyes to commands  P:   D/C sedation Supportive care  RASS goal 0 Thorazine for hiccups   PULMONARY A: Vent dependent respiratory failure secondary to hypertensive crisis with pulmonary edema  Mental status similar to yesterday  P:   Extubate Titrate O2 for sat of 88-92% IS per RT protocol  CARDIOVASCULAR A:  Hypertensive emergency, now resolved Chronic hypertension P:  Continue to hold antihypertensives as needed Tele monitoring  RENAL A:   End-stage renal disease Monday Wednesdays and Fridays.  Note he missed dialysis on Wednesday for unknown reason. Hyponatremia and metabolic acidosis -Last dialysis 1/12 P:   Nephrology following, dialysis plan today BMET in AM Replace electrolytes as indicated  GASTROINTESTINAL A:   GI protection Constipation P:   Continue PPI Bowel protocol Colace 100 BID MOM x1  HEMATOLOGIC A:   Anemia of chronic disease P:  Subcutaneous heparin Trend CBC protocol Transfuse per protocol  INFECTIOUS A:   Fever, possible urinary tract  infection- has been afebrile for past 24 hours . urien culture grew klebsiella.  Note he has a tunneled right IJ hemodialysis catheter as a possible source for infection. P:   Trend fever and white blood cell curve check procalcitonin -currently on vanc and zosyn  Follow culture data- urine culture shows klebsiella - can narrow the antibiotic  To cefazolin or ceftriaxone  ENDOCRINE A:   Prediabetes, hadhypoglycemia requiring D10, now normoglycemic. CBG is normoglycemic.  P:   -continued D10 at 20 ml/hr  Serial CBGs  FAMILY  - Updates: No family at the bedside  - Inter-disciplinary family meet or Palliative Care meeting due by:  day [place] MD IMTS  Attending Note:  67 year old year old male with PMH of ESRD on HD that presents after missing dialysis and with pulmonary edema and respiratory  failure.  Patient was dialyzed yesterday and now is more awake and interactive with clear lungs on exam.  I reviewed CXR myself, ETT is in a good position.  Will proceed with extubation.  IS per RT.  Titrate O2.  Ambulate.  SLP.  Replace electrolytes.  HD again today.  The patient is critically ill with multiple organ systems failure and requires high complexity decision making for assessment and support, frequent evaluation and titration of therapies, application of advanced monitoring technologies and extensive interpretation of multiple databases.   Critical Care Time devoted to patient care services described in this note is  35  Minutes. This time reflects time of care of this signee Dr Jennet Maduro. This critical care time does not reflect procedure time, or teaching time or supervisory time of PA/NP/Med student/Med Resident etc but could involve care discussion time.  Rush Farmer, M.D. [facility] Pulmonary/Critical Care Medicine. Pager: [phone]. After hours pager: [phone].

## 2017-02-23 NOTE — Progress Notes (Signed)
Right AVF duplex: patent AVF. Landry Mellow, RDMS, RVT

## 2017-02-24 DIAGNOSIS — G9340 Encephalopathy, unspecified: Secondary | ICD-10-CM

## 2017-02-24 DIAGNOSIS — D72829 Elevated white blood cell count, unspecified: Secondary | ICD-10-CM

## 2017-02-24 DIAGNOSIS — D638 Anemia in other chronic diseases classified elsewhere: Secondary | ICD-10-CM

## 2017-02-24 DIAGNOSIS — Z720 Tobacco use: Secondary | ICD-10-CM

## 2017-02-24 DIAGNOSIS — I159 Secondary hypertension, unspecified: Secondary | ICD-10-CM

## 2017-02-24 DIAGNOSIS — J449 Chronic obstructive pulmonary disease, unspecified: Secondary | ICD-10-CM

## 2017-02-24 DIAGNOSIS — N390 Urinary tract infection, site not specified: Secondary | ICD-10-CM

## 2017-02-24 DIAGNOSIS — N186 End stage renal disease: Secondary | ICD-10-CM

## 2017-02-24 DIAGNOSIS — R0902 Hypoxemia: Secondary | ICD-10-CM

## 2017-02-24 DIAGNOSIS — E119 Type 2 diabetes mellitus without complications: Secondary | ICD-10-CM

## 2017-02-24 LAB — GLUCOSE, CAPILLARY
Glucose-Capillary: 77 mg/dL (ref 65–99)
Glucose-Capillary: 79 mg/dL (ref 65–99)
Glucose-Capillary: 80 mg/dL (ref 65–99)
Glucose-Capillary: 89 mg/dL (ref 65–99)
Glucose-Capillary: 95 mg/dL (ref 65–99)

## 2017-02-24 LAB — BASIC METABOLIC PANEL
Anion gap: 21 — ABNORMAL HIGH (ref 5–15)
BUN: 52 mg/dL — AB (ref 6–20)
CHLORIDE: 97 mmol/L — AB (ref 101–111)
CO2: 20 mmol/L — AB (ref 22–32)
Calcium: 9.2 mg/dL (ref 8.9–10.3)
Creatinine, Ser: 11.13 mg/dL — ABNORMAL HIGH (ref 0.61–1.24)
GFR calc Af Amer: 5 mL/min — ABNORMAL LOW (ref >60)
GFR calc non Af Amer: 4 mL/min — ABNORMAL LOW (ref >60)
GLUCOSE: 112 mg/dL — AB (ref 65–99)
Potassium: 4.1 mmol/L (ref 3.5–5.1)
Sodium: 138 mmol/L (ref 135–145)

## 2017-02-24 LAB — CBC
HCT: 34.1 % — ABNORMAL LOW (ref 39.0–52.0)
HEMOGLOBIN: 11.3 g/dL — AB (ref 13.0–17.0)
MCH: 34.1 pg — AB (ref 26.0–34.0)
MCHC: 33.1 g/dL (ref 30.0–36.0)
MCV: 103 fL — AB (ref 78.0–100.0)
Platelets: 314 10*3/uL (ref 150–400)
RBC: 3.31 MIL/uL — AB (ref 4.22–5.81)
RDW: 14.4 % (ref 11.5–15.5)
WBC: 12.9 10*3/uL — ABNORMAL HIGH (ref 4.0–10.5)

## 2017-02-24 LAB — CULTURE, BLOOD (ROUTINE X 2)
CULTURE: NO GROWTH
Culture: NO GROWTH
SPECIAL REQUESTS: ADEQUATE
SPECIAL REQUESTS: ADEQUATE

## 2017-02-24 LAB — HEPATIC FUNCTION PANEL
ALBUMIN: 3 g/dL — AB (ref 3.5–5.0)
ALK PHOS: 45 U/L (ref 38–126)
ALT: 16 U/L — ABNORMAL LOW (ref 17–63)
AST: 14 U/L — AB (ref 15–41)
BILIRUBIN TOTAL: 0.9 mg/dL (ref 0.3–1.2)
Bilirubin, Direct: <0.1 mg/dL — ABNORMAL LOW (ref 0.1–0.5)
Total Protein: 6.6 g/dL (ref 6.5–8.1)

## 2017-02-24 LAB — MAGNESIUM: Magnesium: 2.2 mg/dL (ref 1.7–2.4)

## 2017-02-24 LAB — PHOSPHORUS: Phosphorus: 8.3 mg/dL — ABNORMAL HIGH (ref 2.5–4.6)

## 2017-02-24 MED ORDER — ALTEPLASE 2 MG IJ SOLR
INTRAMUSCULAR | Status: AC
  Administered 2017-02-24: 4 mg via ARTERIOVENOUS_FISTULA
  Filled 2017-02-24: qty 4

## 2017-02-24 MED ORDER — PANTOPRAZOLE SODIUM 40 MG PO TBEC
40.0000 mg | DELAYED_RELEASE_TABLET | Freq: Every day | ORAL | Status: DC
  Administered 2017-02-24 – 2017-02-25 (×2): 40 mg via ORAL
  Filled 2017-02-24 (×2): qty 1

## 2017-02-24 MED ORDER — SEVELAMER CARBONATE 2.4 G PO PACK
2.4000 g | PACK | Freq: Three times a day (TID) | ORAL | Status: DC
  Administered 2017-02-25 – 2017-02-26 (×3): 2.4 g via ORAL
  Filled 2017-02-24 (×6): qty 1

## 2017-02-24 MED ORDER — ALTEPLASE 2 MG IJ SOLR
INTRAMUSCULAR | Status: AC
Start: 2017-02-24 — End: 2017-02-24
  Administered 2017-02-24: 2 mg via ARTERIOVENOUS_FISTULA
  Filled 2017-02-24: qty 4

## 2017-02-24 NOTE — Progress Notes (Signed)
Subjective: Interval History: has no complaint, says alright.  Objective: Vital signs in last 24 hours: Temp:  [97.7 F (36.5 C)-99 F (37.2 C)] 97.7 F (36.5 C) (01/16 0327) Pulse Rate:  [77-101] 78 (01/16 0400) Resp:  [11-21] 16 (01/16 0400) BP: (92-158)/(59-76) 132/69 (01/16 0400) SpO2:  [95 %-100 %] 100 % (01/16 0400) FiO2 (%):  [40 %] 40 % (01/15 0826) Weight:  [83.3 kg (183 lb 10.3 oz)-85.6 kg (188 lb 11.4 oz)] 85.6 kg (188 lb 11.4 oz) (01/16 0400) Weight change: -4.5 kg (-14.7 oz)  Intake/Output from previous day: 01/15 0701 - 01/16 0700 In: 285 [I.V.:110; IV Piggyback:125] Out: 0  Intake/Output this shift: No intake/output data recorded.  General appearance: no distress, mildly obese and slowed mentation Resp: diminished breath sounds bilaterally Chest wall: RIJ cath Cardio: S1, S2 normal and systolic murmur: holosystolic 2/6, blowing at apex GI: soft, non-tender; bowel sounds normal; no masses,  no organomegaly Extremities: AVF RUA  Lab Results: Recent Labs    02/23/17 0313 02/24/17 0226  WBC 17.5* 12.9*  HGB 11.6* 11.3*  HCT 34.7* 34.1*  PLT 294 314   BMET:  Recent Labs    02/23/17 0313 02/24/17 0226  NA 134* 138  K 3.8 4.1  CL 95* 97*  CO2 19* 20*  GLUCOSE 129* 112*  BUN 31* 52*  CREATININE 8.31* 11.13*  CALCIUM 9.2 9.2   No results for input(s): PTH in the last 72 hours. Iron Studies: No results for input(s): IRON, TIBC, TRANSFERRIN, FERRITIN in the last 72 hours.  Studies/Results: Dg Chest Port 1 View  Result Date: 02/23/2017 CLINICAL DATA:  Intubation. EXAM: PORTABLE CHEST 1 VIEW COMPARISON:  02/20/2017.  02/19/2017. FINDINGS: Interim removal of NG tube. Endotracheal tube and right IJ sheath in stable position. Heart size normal. No focal infiltrate. Right costophrenic angle not imaged. No pneumothorax. IMPRESSION: 1.  Interim removal of NG tube. 2. Remaining lines and tubes in stable position. No acute cardiopulmonary disease identified.  Electronically Signed   By: Marcello Moores  Register   On: 02/23/2017 07:10    I have reviewed the patient's current medications.  Assessment/Plan: 1 ESRD for HD 2 HTN fair control  3 Anemia stable 4 HPTH needs binder 5 AMS still slow 6 COPD 7 UTI  ?? Any role 8 DM controlled 9 Ankle fx P Hd, binder, ancef, mobilize    LOS: 5 days   Jeneen Rinks Kjerstin Abrigo 02/24/2017,7:16 AM

## 2017-02-24 NOTE — Procedures (Signed)
I was present at this session.  I have reviewed the session itself and made appropriate changes.  Hd via PC. bp tol well. Lower vol and solute.  JD  Jeneen Rinks Shastina Rua 1/16/201911:11 AM

## 2017-02-24 NOTE — Evaluation (Signed)
Physical Therapy Evaluation Patient Details Name: [name] MRN: [id number] DOB: 08/16/1950 Today's Date: 02/24/2017   History of Present Illness  67 year old male with past medical history including essential HTN, ESRD, DM, anemia, COPD and close fracture of his left tibia, presented the [facility] for readmission on 02/19/2017  for Respiratory failure secondary to hypertensive crisis with pulmonary edema. He was intubated for airway protection, respiratory failure, and subsequently was extubated on 1/15.  Clinical Impression  Orders received for PT evaluation. Patient demonstrates deficits in functional mobility as indicated below. Will benefit from continued skilled PT to address deficits and maximize function. Will see as indicated and progress as tolerated.  At this time, patient remains slow to process, demonstrates generalized weakness, and requires physical assist for mobility. Patient recently at [facility] for rehabilitation discharged on 1/7. At this time, will need further post acute rehabilitation services.    Follow Up Recommendations CIR    Equipment Recommendations  None recommended by PT    Recommendations for Other Services Rehab consult     Precautions / Restrictions Precautions Precautions: Fall Precaution Comments: Per initial ED note, pt is to be NWB and follow up with ortho in office  Required Braces or Orthoses: Other Brace/Splint Other Brace/Splint: Short leg cast Restrictions Weight Bearing Restrictions: Yes LLE Weight Bearing: Non weight bearing Other Position/Activity Restrictions: Per initial ED note, pt to be NWB and follow up with ortho in office       Mobility  Bed Mobility Overal bed mobility: Needs Assistance Bed Mobility: Supine to Sit     Supine to sit: Max assist;+2 for physical assistance;HOB elevated     General bed mobility comments: Patient able to initiate some LE movement to EOB with increased time but requires maximal assist to carry out task  of trunk rotation and elevation to upright  Transfers Overall transfer level: Needs assistance   Transfers: Lateral/Scoot Transfers Sit to Stand: Mod assist;+2 physical assistance         General transfer comment: Moderate assist of 2 with chuck pad to later scoot from bed to drop arm recliner, delayed processing for patient to follow commands AX:KPVV placement and positioning, however, once he reached chair, patient able to reposition himself without assist  Ambulation/Gait             General Gait Details: deferred at this time 2/2 congition and safety  Stairs            Wheelchair Mobility    Modified Rankin (Stroke Patients Only)       Balance                                             Pertinent Vitals/Pain Pain Assessment: Faces Faces Pain Scale: Hurts little more Pain Location: LEs with movement Pain Descriptors / Indicators: Grimacing Pain Intervention(s): Monitored during session    Home Living Family/patient expects to be discharged to:: Private residence Living Arrangements: Alone Available Help at Discharge: Family;Available 24 hours/day(dtr Monique) Type of Home: House Home Access: Level entry     Home Layout: One level Home Equipment: None Additional Comments: PLOF information taken from prior chart review     Prior Function                 Hand Dominance   Dominant Hand: Right    Extremity/Trunk Assessment   Upper Extremity Assessment Upper  Extremity Assessment: Defer to OT evaluation    Lower Extremity Assessment Lower Extremity Assessment: Generalized weakness;LLE deficits/detail LLE: Unable to fully assess due to immobilization       Communication   Communication: No difficulties  Cognition Arousal/Alertness: Awake/alert Behavior During Therapy: Flat affect Overall Cognitive Status: History of cognitive impairments - at baseline Area of Impairment: Attention;Memory;Following  commands;Safety/judgement;Awareness;Problem solving                   Current Attention Level: Focused   Following Commands: Follows one step commands with increased time Safety/Judgement: Decreased awareness of safety;Decreased awareness of deficits Awareness: Intellectual Problem Solving: Slow processing;Decreased initiation;Difficulty sequencing;Requires verbal cues;Requires tactile cues        General Comments      Exercises     Assessment/Plan    PT Assessment Patient needs continued PT services  PT Problem List Decreased strength;Decreased mobility;Decreased safety awareness;Decreased coordination;Decreased balance       PT Treatment Interventions DME instruction;Therapeutic activities;Gait training;Therapeutic exercise;Patient/family education;Stair training;Balance training;Functional mobility training;Neuromuscular re-education    PT Goals (Current goals can be found in the Care Plan section)  Acute Rehab PT Goals Patient Stated Goal: to go home  PT Goal Formulation: With patient Time For Goal Achievement: 03/10/17 Potential to Achieve Goals: Good    Frequency Min 5X/week   Barriers to discharge        Co-evaluation PT/OT/SLP Co-Evaluation/Treatment: Yes Reason for Co-Treatment: Complexity of the patient's impairments (multi-system involvement);Necessary to address cognition/behavior during functional activity;For patient/therapist safety PT goals addressed during session: Mobility/safety with mobility;Balance         AM-PAC PT "6 Clicks" Daily Activity  Outcome Measure Difficulty turning over in bed (including adjusting bedclothes, sheets and blankets)?: Unable Difficulty moving from lying on back to sitting on the side of the bed? : Unable Difficulty sitting down on and standing up from a chair with arms (e.g., wheelchair, bedside commode, etc,.)?: Unable Help needed moving to and from a bed to chair (including a wheelchair)?: A Lot Help needed  walking in hospital room?: A Lot Help needed climbing 3-5 steps with a railing? : Total 6 Click Score: 8    End of Session   Activity Tolerance: Patient tolerated treatment well Patient left: in chair;with call bell/phone within reach Nurse Communication: Mobility status PT Visit Diagnosis: Other abnormalities of gait and mobility (R26.89);Unsteadiness on feet (R26.81)    Time: 0263-7858 PT Time Calculation (min) (ACUTE ONLY): 25 min   Charges:   PT Evaluation $PT Eval Moderate Complexity: 1 Mod     PT G Codes:        Alben Deeds, PT DPT  Board Certified Neurologic Specialist [phone]   Duncan Dull 02/24/2017, 10:55 AM

## 2017-02-24 NOTE — Progress Notes (Signed)
PULMONARY / CRITICAL CARE MEDICINE   Name: [name] MRN: [id number] DOB: [birth year]    ADMISSION DATE:  02/19/2017 CONSULTATION DATE: 02/19/2017  REFERRING MD: Emergency department physician  CHIEF COMPLAINT: Altered mental status  HISTORY OF PRESENT ILLNESS:   67 year old male with extensive past medical history including essential hypertension, end-stage renal disease, diabetes mellitus, anemia, chronic obstructive pulmonary disease noted to be still smoking by last evaluation and close fracture of his left tibia. He presents the emergency department [facility] 02/19/2017 after last being seen normal at 2 AM on 02/19/2017.  His daughter found him in the bed and EMS found her blood pressure be 230/190.  He was admitted to the emergency department and found to have a blood pressure of 200/170.  He was intubated placed on mechanical ventilatory support and is currently in the process to have a CT of the head for questionable intracerebral hemorrhage.  Note that he missed hemodialysis on Wednesday, 02/18/2017 and renal services were notified of his admission.  With his hypertensive crisis his altered mental status may be from press.  We will address hypertension CT of the head to rule out intracerebral hemorrhage.  He will be admitted to the intensive care unit for further evaluation and treatment.  SUBJECTIVE:  Pt was extubated yesterday.  Urine culture grew Klebsiella  This morning he is alert to his name and place, but not date. He is able to verbalize it. He denies any pain. Not having any hiccups.    VITAL SIGNS: BP 136/64   Pulse 71   Temp 98.2 F (36.8 C) (Oral)   Resp 18   Ht 6\' 1"  (1.854 m)   Wt 188 lb 11.4 oz (85.6 kg)   SpO2 93%   BMI 24.90 kg/m   HEMODYNAMICS:    VENTILATOR SETTINGS: Vent Mode: PSV;CPAP FiO2 (%):  [40 %] 40 % PEEP:  [5 cmH20] 5 cmH20 Pressure Support:  [5 cmH20] 5 cmH20  INTAKE / OUTPUT: I/O last 3 completed shifts: In: 425 [I.V.:150;  Other:50; IV Piggyback:225] Out: 0   PHYSICAL EXAMINATION: General: This is a well-developed 67 year old male, he is alert to his name and place  HEENT: Normocephalic atraumatic mucous membranes moist  Pulmonary: clear to auscultation bilaterally, normal breath sounds  Cardiac: Regular rate and rhythm , and systolic murmur heard Abdomen: Soft nontender no organomegaly   extremities/musculoskeletal: Left lower leg cast placed, CMS intact, warm dry no significant edema   LABS:  BMET Recent Labs  Lab 02/22/17 0547 02/23/17 0313 02/24/17 0226  NA 130* 134* 138  K 3.6 3.8 4.1  CL 92* 95* 97*  CO2 19* 19* 20*  BUN 46* 31* 52*  CREATININE 11.69* 8.31* 11.13*  GLUCOSE 139* 129* 112*    Electrolytes Recent Labs  Lab 02/22/17 0547 02/23/17 0313 02/24/17 0226  CALCIUM 8.7* 9.2 9.2  MG 1.9 2.0 2.2  PHOS 6.4* 5.5* 8.3*    CBC Recent Labs  Lab 02/22/17 0547 02/23/17 0313 02/24/17 0226  WBC 17.2* 17.5* 12.9*  HGB 11.5* 11.6* 11.3*  HCT 34.6* 34.7* 34.1*  PLT 236 294 314    Coag's Recent Labs  Lab 02/19/17 1353  APTT 29  INR 0.99    Sepsis Markers Recent Labs  Lab 02/19/17 1040 02/19/17 1353  LATICACIDVEN  --  1.5  PROCALCITON 0.78  --     ABG Recent Labs  Lab 02/19/17 1500 02/20/17 0535 02/23/17 0315  PHART 7.321* 7.357 7.385  PCO2ART 45.4 36.4 37.2  PO2ART 150.0*  130* 107    Liver Enzymes Recent Labs  Lab 02/19/17 1040 02/20/17 1642 02/21/17 0354 02/24/17 0226  AST 19  --  33 14*  ALT 18  --  15* 16*  ALKPHOS 64  --  50 45  BILITOT 0.7  --  0.8 0.9  ALBUMIN 4.2 3.1* 3.1* 3.0*    Cardiac Enzymes Recent Labs  Lab 02/19/17 1040 02/19/17 1815 02/20/17 0017  TROPONINI 0.03* 0.03* 0.03*    Glucose Recent Labs  Lab 02/23/17 1152 02/23/17 1543 02/23/17 1945 02/23/17 2334 02/24/17 0320 02/24/17 0740  GLUCAP 111* 99 87 93 79 95    Imaging No results found. STUDIES:  02/19/2017 CT of the head>>  CULTURES: Blood culture  1/11 > Urine culture 1/11 > klebsiella   ANTIBIOTICS: Ancef 1/15>>> Vancomycin 1/13>>>1/15 Zosyn 1/13>>>1/15  SIGNIFICANT EVENTS: 02/19/2017 found with altered mental status and respiratory respirations and admitted to [facility]  1/15 Extubated   LINES/TUBES: 02/19/2017 endotracheal tube>> 1/15  DISCUSSION: 67 year old male with extensive past medical history including essential hypertension, end-stage renal disease, diabetes mellitus, anemia, chronic obstructive pulmonary disease noted to be still smoking by last evaluation and close fracture of his left tibia. He presents the emergency department [facility] 02/19/2017 after last being seen normal at 2 AM on 02/19/2017.  He was intubated for airway protection, respiratory failure, and admitted to the intensive care.  Nephrology was consulted, and he was extubated on 1/15  ASSESSMENT / PLAN:  NEUROLOGIC/musculoskeletal A:   Acute encephalopathy: Likely hypertensive encephalopathy versus process such as press  Note he has a fractured left leg possible  He is following commands today and is alert and oriented to name and place   P:   -extubated on 1/15 Supportive care  Thorazine for hiccups   PULMONARY A: Vent dependent respiratory failure secondary to hypertensive crisis with pulmonary edema. Pt has successfully remained extubated since yesterday and is following commands.   P:   Titrate O2 for sat of 88-92% IS per RT protocol  CARDIOVASCULAR A:  Hypertensive emergency, now resolved Chronic hypertension P:  Continue to hold antihypertensives as needed Metoprolol 5 mg q6 hours PRN Tele monitoring  RENAL A:   End-stage renal disease Monday Wednesdays and Fridays.  Note he missed dialysis on Wednesday prior to coming here for unknown reason.  mild metabolic acidosis  P:   Nephrology following, dialysis plan today BMET in AM Replace electrolytes as indicated  GASTROINTESTINAL A:   GI  protection Constipation P:   Continue PPI Bowel protocol Colace 100 BID MOM x1  HEMATOLOGIC A:   Anemia of chronic disease P:  Subcutaneous heparin Trend CBC protocol Transfuse per protocol  INFECTIOUS A:   urinary tract infection- has been afebrile for past 24 hours . urien culture grew klebsiella.  Note he has a tunneled right IJ hemodialysis catheter as a possible source for infection. P:   Trend fever and white blood cell curve check procalcitonin -abx changed to cefazolin as cultures grew klebsiella sens to ancef   ENDOCRINE A:   Prediabetes, hadhypoglycemia requiring D10, now normoglycemic. CBG is normoglycemic.  P:   -continued D10 at 20 ml/hr  Serial CBGs  FAMILY  - Updates: No family at the bedside  - Inter-disciplinary family meet or Palliative Care meeting due by:  day 7  Burgess Estelle MD IMTS  Attending Note:  68 year old male with PMH of ESRD on HD who missed dialysis and presents with respiratory failure due to inability to protect  his airway and was intubated.  Patient was extubated 1/15 and doing well.  Going back to dialysis today.  On exam, lungs are clear.  I reviewed CXR myself, ETT is in good position.  Discussed with PCCM-NP and TRH-MD.   AMS: due to respiratory failure   - Avoid sedatives  - Monitor  Acute respiratory failure:  - Monitor for airway protection  Hypoxemia:  - Titrate O2 for sat of 88-92%  - May need an ambulatory desat study prior to discharge for home O2  Physical deconditioning  - CIR consult placed  UTI:  - D/C cefazolin after today  Transfer to tele and to [facility] service with PCCM off 1/17.  Patient seen and examined, agree with above note.  I dictated the care and orders written for this patient under my direction.  Rush Farmer, [place]

## 2017-02-24 NOTE — Progress Notes (Signed)
Inpatient Rehabilitation  Per PT, SLP request, patient was screened by Gunnar Fusi for appropriateness for an Inpatient Acute Rehab consult.  At this time we are recommending an Inpatient Rehab consult.  Text paged attending to notify; please order if you are agreeable.    Carmelia Roller., CCC/SLP Admission Coordinator  [place]  Cell [phone]

## 2017-02-24 NOTE — Progress Notes (Signed)
  Speech Language Pathology Treatment: Dysphagia  Patient Details Name: [name] MRN: [id number] DOB: 1950/08/21 Today's Date: 02/24/2017 Time: 0017-4944 SLP Time Calculation (min) (ACUTE ONLY): 25 min  Assessment / Plan / Recommendation Clinical Impression  Pt is much more alert today but still has a strong, delayed cough response following small boluses of water. Coughing is greatly reduced with boluses of puree and ice chip trials, although he does have a baseline intermittent cough. Recommend allowing ice chips from staff after oral care and meds crushed in small amounts of puree. SLP will f/u for readiness to start diet versus need for instrumental testing with good prognosis for return to POs given additional time post-extubation.   HPI HPI: 67 year old male with extensive past medical history including essential hypertension, GERD, COPD, end-stage renal disease, diabetes mellitus, anemia, + tobacco use and close fracture of his left tibia. Pt found unresponsive and intubated 1/11-1/15. Acute encephalopathy per chart likely hypertensive encheph versus PRES. CXR No acute cardiopulmonary disease identified. BSE 01/23/17 rec'sd NPO, next day initiated full liquids and upgraded to Dys 3, thin.       SLP Plan  Continue with current plan of care       Recommendations  Diet recommendations: NPO;Other(comment)(few ice chips after oral care) Medication Administration: Crushed with puree                Oral Care Recommendations: Oral care QID Follow up Recommendations: Inpatient Rehab SLP Visit Diagnosis: Dysphagia, unspecified (R13.10) Plan: Continue with current plan of care       GO                Germain Osgood 02/24/2017, 11:04 AM  Germain Osgood, M.A. CCC-SLP [phone]
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
## 2017-02-25 ENCOUNTER — Other Ambulatory Visit: Payer: Self-pay

## 2017-02-25 DIAGNOSIS — G9340 Encephalopathy, unspecified: Secondary | ICD-10-CM

## 2017-02-25 DIAGNOSIS — E119 Type 2 diabetes mellitus without complications: Secondary | ICD-10-CM

## 2017-02-25 DIAGNOSIS — J9601 Acute respiratory failure with hypoxia: Secondary | ICD-10-CM

## 2017-02-25 DIAGNOSIS — R4182 Altered mental status, unspecified: Secondary | ICD-10-CM

## 2017-02-25 LAB — GLUCOSE, CAPILLARY
GLUCOSE-CAPILLARY: 114 mg/dL — AB (ref 65–99)
GLUCOSE-CAPILLARY: 108 mg/dL — AB (ref 65–99)
Glucose-Capillary: 88 mg/dL (ref 65–99)
Glucose-Capillary: 120 mg/dL — ABNORMAL HIGH (ref 65–99)
Glucose-Capillary: 128 mg/dL — ABNORMAL HIGH (ref 65–99)

## 2017-02-25 MED ORDER — HALOPERIDOL 2 MG PO TABS
2.0000 mg | ORAL_TABLET | Freq: Three times a day (TID) | ORAL | Status: DC | PRN
  Administered 2017-02-25: 2 mg via ORAL
  Filled 2017-02-25 (×2): qty 1

## 2017-02-25 MED ORDER — SODIUM CHLORIDE 0.9 % IV SOLN
12.5000 mg | Freq: Once | INTRAVENOUS | Status: AC
  Administered 2017-02-25: 12.5 mg via INTRAVENOUS
  Filled 2017-02-25: qty 0.5

## 2017-02-25 MED ORDER — INFLUENZA VAC SPLIT HIGH-DOSE 0.5 ML IM SUSY
0.5000 mL | PREFILLED_SYRINGE | INTRAMUSCULAR | Status: AC
  Administered 2017-02-26: 0.5 mL via INTRAMUSCULAR
  Filled 2017-02-25: qty 0.5

## 2017-02-25 MED ORDER — HALOPERIDOL 1 MG PO TABS: 1.0000 mg | ORAL_TABLET | Freq: Three times a day (TID) | ORAL | Status: DC | PRN

## 2017-02-25 MED ORDER — NEPRO/CARBSTEADY PO LIQD
237.0000 mL | Freq: Two times a day (BID) | ORAL | Status: DC
  Administered 2017-02-25 – 2017-02-26 (×2): 237 mL via ORAL
  Filled 2017-02-25 (×5): qty 237

## 2017-02-25 MED ORDER — CHLORHEXIDINE GLUCONATE 0.12 % MT SOLN
OROMUCOSAL | Status: AC
  Administered 2017-02-25: 15 mL
  Filled 2017-02-25: qty 15

## 2017-02-25 NOTE — Progress Notes (Signed)
  Speech Language Pathology Treatment: Dysphagia  Patient Details Name: [name] MRN: [id number] DOB: 1950/11/02 Today's Date: 02/25/2017 Time: 1045-1100 SLP Time Calculation (min) (ACUTE ONLY): 15 min  Assessment / Plan / Recommendation Clinical Impression  Pt is alert, participatory, but confused. Has the hiccups and a congested baseline cough prior to PO being given. Pt consumed 6 oz of water with occasional belching and further hiccups. Masticated regular textures without difficulty. At end of session pt again coughed, though did not appear associated with water given prolonged time and no difficulty with prior intake. Will advance diet to dys 3 mech soft and thin liquids and f/u for tolerance.   HPI HPI: 67 year old male with extensive past medical history including essential hypertension, GERD, COPD, end-stage renal disease, diabetes mellitus, anemia, + tobacco use and close fracture of his left tibia. Pt found unresponsive and intubated 1/11-1/15. Acute encephalopathy per chart likely hypertensive encheph versus PRES. CXR No acute cardiopulmonary disease identified. BSE 01/23/17 rec'sd NPO, next day initiated full liquids and upgraded to Dys 3, thin.       SLP Plan  Continue with current plan of care       Recommendations  Diet recommendations: Dysphagia 3 (mechanical soft);Thin liquid Liquids provided via: Cup Medication Administration: Crushed with puree Supervision: Patient able to self feed                Oral Care Recommendations: Oral care BID Follow up Recommendations: Inpatient Rehab SLP Visit Diagnosis: Dysphagia, unspecified (R13.10) Plan: Continue with current plan of care       GO               [facility], MA CCC-SLP [phone]  Lynann Beaver 02/25/2017, 11:09 AM

## 2017-02-25 NOTE — Progress Notes (Addendum)
Initial Nutrition Assessment  DOCUMENTATION CODES:   Not applicable  INTERVENTION:   Nepro Shake po BID, each supplement provides 425 kcal and 19 grams protein  NUTRITION DIAGNOSIS:   Inadequate oral intake related to inability to eat as evidenced by NPO status.  Progressing   GOAL:   Patient will meet greater than or equal to 90% of their needs  Progressing  MONITOR:   Diet advancement, Vent status, Labs, Weight trends, I & O's  ASSESSMENT:   67 y/o male PMHx ESRD on HD, HTN/HLD, DM, COPD, GERD, tobacco abuse, recently hospitalized 12/12-12/26 for toxic metabolic encephalopathy r/t meds, possible sepsis and possible sleep disorder. Went to IR rehab from 12/26-1/7. Found at home w/ respiratory difficulty/AMS. EMS found BP 230-190. Intubated on arrival to ED due to hypertensive encephalopathy and resp failure w/ pulm edema.  Pt extubated 01/15. Per chart, HD session yesterday shortened d/t clotted TDC, for HD today. Pt s/p SLP evaluation today, diet advanced to Dysphagia 3, thin liquids. Per chart, pt consumed 100% of breakfast this morning.  Pt seems confused and unable to elaborate on RD questions at visit, however, no nutrition impact symptoms reported. Pt amenable to nutritional supplementation while admitted.   Labs reviewed; CBG 77-114, BUN 52, Phosphorus 8.3, Albumin 3.0 Medications reviewed; Colace, sliding scale insulin, Rena-vit, Protonix, ferric gluconate  Diet Order:  DIET DYS 3 Room service appropriate? Yes; Fluid consistency: Thin  EDUCATION NEEDS:   No education needs have been identified at this time  Skin:  Skin Assessment: Reviewed RN Assessment  Last BM:  02/25/17  Height:   Ht Readings from Last 1 Encounters:  02/19/17 6\' 1"  (1.854 m)    Weight:   Wt Readings from Last 1 Encounters:  02/25/17 190 lb 11.2 oz (86.5 kg)    Ideal Body Weight:  83.63 kg  BMI:  Body mass index is 25.16 kg/m.  Estimated Nutritional Needs:   Kcal:   2150-2350  Protein:  118-135 g (1.4-1.6 g/kg bw)  Fluid:  UOP plus 1000 mL  Parks Ranger, MS, RDN, LDN 02/25/2017 2:01 PM

## 2017-02-25 NOTE — Progress Notes (Signed)
PROGRESS NOTE    [name]  WUJ:[id number] DOB: [birth year] DOA: 02/19/2017 PCP: [facility]   Brief Narrative: 67 year old male with history of hypertension, ESRD on hemodialysis,, COPD, left tibial closed fracture presented to ER on 02/19/2017 after last being seen normal at 2 AM on the day of admission.  His blood pressure noted to be 230/190.  In the ER patient's blood pressure was 200/170 with change in mental status.  He was intubated, CT scan of head for questionable intracerebral hemorrhage.  Patient was noncompliant with her outpatient rheumatology treatment.  He was admitted in ICU service.  Urine culture growing Klebsiella mix sensitive to cefazolin.  Patient was extubated.  Transferred to [facility] on 02/25/2017.  Assessment & Plan:   #Acute metabolic/hypertensive encephalopathy: Patient was alert awake but not really oriented to place and time.  Continue to provide supportive care.  Patient with intermittent agitation likely developing delirium.  Haldol as needed for the management of agitation.  #Acute respiratory failure with hypoxia/acute pulmonary edema with hypertensive crisis: Patient is now extubated.  Continue supportive care.  Oxygen as needed.  #Hypertensive emergency on admission: Blood pressure better controlled.  It was likely contributed by volume.  Monitor BP.  #ESRD on hemodialysis: Patient missed hemodialysis prior to admission.  Continue hemodialysis per nephrologist.  Monitor BMP, volume and electrolytes.  #Klebsiella likely for UTI: Unknown if this is colonization in a patient with ESRD.  However patient is on cefazolin.  Completing antibiotics course today.  #Physical deconditioning: Patient is not going to see IR.  Social worker consulted for SNF placement.  #Oropharyngeal dysphagia: Swallow evaluation ongoing.  Dysphagia level 3 diet.  #Type 2 diabetes: Continue insulin sliding scale.  Monitor blood sugar level.  DVT prophylaxis:  Subcutaneous Code Status: Full code Family Communication: No family at bedside Disposition Plan: Likely discharge to skilled nursing home in 1-2 days    Consultants:   Admitted by [facility]  Nephrology  Inpatient rehab  Procedures: Intubated Antimicrobials: Cefazolin  Subjective: Seen and examined at bedside.  Patient is alert awake but not oriented.  View of systems limited.  Objective: Vitals:   02/24/17 2233 02/25/17 0100 02/25/17 0431 02/25/17 1000  BP: (!) 152/73  (!) 160/68 (!) 135/59  Pulse: 85  92 85  Resp: 18  18 18   Temp: 99.3 F (37.4 C)  98.4 F (36.9 C) 98.2 F (36.8 C)  TempSrc: Oral  Oral Oral  SpO2: 97%  97% 98%  Weight:  86.5 kg (190 lb 11.2 oz)    Height:        Intake/Output Summary (Last 24 hours) at 02/25/2017 1316 Last data filed at 02/25/2017 0950 Gross per 24 hour  Intake 240 ml  Output 1170 ml  Net -930 ml   Filed Weights   02/24/17 0400 02/24/17 1104 02/25/17 0100  Weight: 85.6 kg (188 lb 11.4 oz) 85.3 kg (188 lb 0.8 oz) 86.5 kg (190 lb 11.2 oz)    Examination:  General exam: Appears calm and comfortable, Respiratory system: Clear to auscultation. Respiratory effort normal. No wheezing or crackle Cardiovascular system: S1 & S2 heard, RRR.  No pedal edema. Gastrointestinal system: Abdomen is nondistended, soft and nontender. Normal bowel sounds heard. Central nervous system: Alert awake but not really oriented. Skin: No rashes, lesions or ulcers Psychiatry: Judgement and insight appear impaired    Data Reviewed: I have personally reviewed following labs and imaging studies  CBC: Recent Labs  Lab 02/19/17 1040 02/20/17 0017 02/20/17 1642 02/22/17  7782 02/23/17 0313 02/24/17 0226  WBC 12.1* 10.0 12.6* 17.2* 17.5* 12.9*  NEUTROABS 11.2* 6.8  --   --   --   --   HGB 13.3 10.8* 10.6* 11.5* 11.6* 11.3*  HCT 40.5 33.0* 32.8* 34.6* 34.7* 34.1*  MCV 103.8* 102.5* 103.1* 103.6* 102.1* 103.0*  PLT 319 184 241 236 294 423   Basic  Metabolic Panel: Recent Labs  Lab 02/19/17 1040 02/20/17 0017 02/20/17 1642 02/21/17 0354 02/22/17 0547 02/23/17 0313 02/24/17 0226  NA 133* 128* 125* 130* 130* 134* 138  K 4.1 4.0 4.3 3.9 3.6 3.8 4.1  CL 95* 94* 92* 93* 92* 95* 97*  CO2 19* 17* 17* 19* 19* 19* 20*  GLUCOSE <20* 71 104* 185* 139* 129* 112*  BUN 45* 50* 53* 30* 46* 31* 52*  CREATININE 11.92* 12.31* 13.01* 9.49* 11.69* 8.31* 11.13*  CALCIUM 10.6* 9.2 8.7*  8.8 8.4* 8.7* 9.2 9.2  MG 2.3 2.0  --   --  1.9 2.0 2.2  PHOS 3.1 3.7 4.6  --  6.4* 5.5* 8.3*   GFR: Estimated Creatinine Clearance: 7.2 mL/min (A) (by C-G formula based on SCr of 11.13 mg/dL (H)). Liver Function Tests: Recent Labs  Lab 02/19/17 1040 02/20/17 1642 02/21/17 0354 02/24/17 0226  AST 19  --  33 14*  ALT 18  --  15* 16*  ALKPHOS 64  --  50 45  BILITOT 0.7  --  0.8 0.9  PROT 7.7  --  6.1* 6.6  ALBUMIN 4.2 3.1* 3.1* 3.0*   No results for input(s): LIPASE, AMYLASE in the last 168 hours. Recent Labs  Lab 02/19/17 1353  AMMONIA 42*   Coagulation Profile: Recent Labs  Lab 02/19/17 1353  INR 0.99   Cardiac Enzymes: Recent Labs  Lab 02/19/17 1040 02/19/17 1815 02/20/17 0017  TROPONINI 0.03* 0.03* 0.03*   BNP (last 3 results) No results for input(s): PROBNP in the last 8760 hours. HbA1C: No results for input(s): HGBA1C in the last 72 hours. CBG: Recent Labs  Lab 02/24/17 2008 02/24/17 2358 02/25/17 0427 02/25/17 0819 02/25/17 1113  GLUCAP 89 80 114* 108* 88   Lipid Profile: No results for input(s): CHOL, HDL, LDLCALC, TRIG, CHOLHDL, LDLDIRECT in the last 72 hours. Thyroid Function Tests: No results for input(s): TSH, T4TOTAL, FREET4, T3FREE, THYROIDAB in the last 72 hours. Anemia Panel: No results for input(s): VITAMINB12, FOLATE, FERRITIN, TIBC, IRON, RETICCTPCT in the last 72 hours. Sepsis Labs: Recent Labs  Lab 02/19/17 1040 02/19/17 1353  PROCALCITON 0.78  --   LATICACIDVEN  --  1.5    Recent Results (from  the past 240 hour(s))  Urine culture     Status: Abnormal   Collection Time: 02/19/17 12:21 PM  Result Value Ref Range Status   Specimen Description URINE, CATHETERIZED  Final   Special Requests Normal  Final   Culture MULTIPLE SPECIES PRESENT, SUGGEST RECOLLECTION (A)  Final   Report Status 02/20/2017 FINAL  Final  Culture, blood (routine x 2)     Status: None   Collection Time: 02/19/17  1:53 PM  Result Value Ref Range Status   Specimen Description BLOOD BLOOD LEFT WRIST  Final   Special Requests IN PEDIATRIC BOTTLE Blood Culture adequate volume  Final   Culture NO GROWTH 5 DAYS  Final   Report Status 02/24/2017 FINAL  Final  Culture, blood (routine x 2)     Status: None   Collection Time: 02/19/17  3:01 PM  Result Value Ref Range Status  Specimen Description BLOOD BLOOD RIGHT ARM  Final   Special Requests   Final    BOTTLES DRAWN AEROBIC AND ANAEROBIC Blood Culture adequate volume   Culture NO GROWTH 5 DAYS  Final   Report Status 02/24/2017 FINAL  Final  MRSA PCR Screening     Status: Abnormal   Collection Time: 02/19/17  5:22 PM  Result Value Ref Range Status   MRSA by PCR POSITIVE (A) NEGATIVE Final    Comment:        The GeneXpert MRSA Assay (FDA approved for NASAL specimens only), is one component of a comprehensive MRSA colonization surveillance program. It is not intended to diagnose MRSA infection nor to guide or monitor treatment for MRSA infections. RESULT CALLED TO, READ BACK BY AND VERIFIED WITH: MCROSS,RN @2220  02/19/17 BY LHOWARD   Culture, Urine     Status: Abnormal   Collection Time: 02/20/17 11:55 PM  Result Value Ref Range Status   Specimen Description URINE, CATHETERIZED  Final   Special Requests NONE  Final   Culture >=100,000 COLONIES/mL KLEBSIELLA PNEUMONIAE (A)  Final   Report Status 02/23/2017 FINAL  Final   Organism ID, Bacteria KLEBSIELLA PNEUMONIAE (A)  Final      Susceptibility   Klebsiella pneumoniae - MIC*    AMPICILLIN RESISTANT  Resistant     CEFAZOLIN <=4 SENSITIVE Sensitive     CEFTRIAXONE <=1 SENSITIVE Sensitive     CIPROFLOXACIN <=0.25 SENSITIVE Sensitive     GENTAMICIN <=1 SENSITIVE Sensitive     IMIPENEM <=0.25 SENSITIVE Sensitive     NITROFURANTOIN 64 INTERMEDIATE Intermediate     TRIMETH/SULFA <=20 SENSITIVE Sensitive     AMPICILLIN/SULBACTAM 4 SENSITIVE Sensitive     PIP/TAZO <=4 SENSITIVE Sensitive     Extended ESBL NEGATIVE Sensitive     * >=100,000 COLONIES/mL KLEBSIELLA PNEUMONIAE         Radiology Studies: No results found.      Scheduled Meds: . chlorhexidine gluconate (MEDLINE KIT)  15 mL Mouth Rinse BID  . docusate sodium  100 mg Oral BID  . heparin injection (subcutaneous)  5,000 Units Subcutaneous Q8H  . insulin aspart  0-9 Units Subcutaneous Q4H  . mouth rinse  15 mL Mouth Rinse QID  . multivitamin  1 tablet Oral QHS  . pantoprazole  40 mg Oral QHS  . sevelamer carbonate  2.4 g Oral TID WC   Continuous Infusions: . sodium chloride    .  ceFAZolin (ANCEF) IV Stopped (02/24/17 1830)  . ferric gluconate (FERRLECIT/NULECIT) IV       LOS: 6 days    Jala Dundon Tanna Furry, MD [facility] Pager [phone]  If 7PM-7AM, please contact night-coverage [email or web address] Password [email or web address] 02/25/2017, 1:16 PM

## 2017-02-25 NOTE — Progress Notes (Addendum)
I met with pt at bedside and then contacted his daughter, Beckie Busing, by phone. She is requesting pt be readmitted to [facility] inpt rehab if felt to be a candidate. I will discuss with Dr. Naaman Plummer and rehab team to reassess if pt would be a candidate for readmission. I will follow up after that discussion. Beckie Busing can provide 24/7 care of pt when home. [phone]
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
## 2017-02-25 NOTE — Progress Notes (Signed)
[facility] Progress Note   Subjective:  Increased confusion today. Unable to answer questions. Says he is cold.  Objective Vitals:   02/24/17 2233 02/25/17 0100 02/25/17 0431 02/25/17 1000  BP: (!) 152/73  (!) 160/68 (!) 135/59  Pulse: 85  92 85  Resp: _0 Temp: 99.3 F (37.4 C)  98.4 F (36.9 C) 98.2 F (36.8 C)  TempSrc: Oral  Oral Oral  SpO2: 97%  97% 98%  Weight:  86.5 kg (190 lb 11.2 oz)    Height:       Physical Exam General: NAD, alert, slowed mentation Heart:RRR, +2/3 systolic murmur, no rub or gallop Lungs:diminished breath sounds b/l Abdomen: soft, NTND, +BS, no masses Extremities: trace LE edema, R leg in splint  Dialysis Access: RU AVF b/t, L [facility]  Filed Weights   02/23/17 1600 02/24/17 0400 02/25/17 0100  Weight: 83.3 kg (183 lb 10.3 oz) 85.6 kg (188 lb 11.4 oz) 86.5 kg (190 lb 11.2 oz)    Intake/Output Summary (Last 24 hours) at 02/25/2017 1043 Last data filed at 02/25/2017 0950 Gross per 24 hour  Intake 240 ml  Output 1170 ml  Net -930 ml    Additional Objective Labs: Basic Metabolic Panel: Recent Labs  Lab 02/22/17 0547 02/23/17 0313 02/24/17 0226  NA 130* 134* 138  K 3.6 3.8 4.1  CL 92* 95* 97*  CO2 19* 19* 20*  GLUCOSE 139* 129* 112*  BUN 46* 31* 52*  CREATININE 11.69* 8.31* 11.13*  CALCIUM 8.7* 9.2 9.2  PHOS 6.4* 5.5* 8.3*   Liver Function Tests: Recent Labs  Lab 02/19/17 1040 02/20/17 1642 02/21/17 0354 02/24/17 0226  AST 19  --  33 14*  ALT 18  --  15* 16*  ALKPHOS 64  --  50 45  BILITOT 0.7  --  0.8 0.9  PROT 7.7  --  6.1* 6.6  ALBUMIN 4.2 3.1* 3.1* 3.0*   CBC: Recent Labs  Lab 02/19/17 1040 02/20/17 0017 02/20/17 1642 02/22/17 0547 02/23/17 0313 02/24/17 0226  WBC 12.1* 10.0 12.6* 17.2* 17.5* 12.9*  NEUTROABS 11.2* 6.8  --   --   --   --   HGB 13.3 10.8* 10.6* 11.5* 11.6* 11.3*  HCT 40.5 33.0* 32.8* 34.6* 34.7* 34.1*  MCV 103.8* 102.5* 103.1* 103.6* 102.1* 103.0*  PLT 319 184 241 236  294 314    Cardiac Enzymes: Recent Labs  Lab 02/19/17 1040 02/19/17 1815 02/20/17 0017  TROPONINI 0.03* 0.03* 0.03*   CBG: Recent Labs  Lab 02/24/17 1635 02/24/17 2008 02/24/17 2358 02/25/17 0427 02/25/17 0819  GLUCAP 77 89 80 114* 108*   Lab Results  Component Value Date   INR 0.99 02/19/2017   INR 1.7 (H) 09/11/2007   INR 1.5 09/10/2007   Studies/Results: No results found.  Medications: . sodium chloride    .  ceFAZolin (ANCEF) IV Stopped (02/24/17 1830)  . ferric gluconate (FERRLECIT/NULECIT) IV     . chlorhexidine gluconate (MEDLINE KIT)  15 mL Mouth Rinse BID  . docusate sodium  100 mg Oral BID  . heparin injection (subcutaneous)  5,000 Units Subcutaneous Q8H  . insulin aspart  0-9 Units Subcutaneous Q4H  . mouth rinse  15 mL Mouth Rinse QID  . multivitamin  1 tablet Oral QHS  . pantoprazole  40 mg Oral QHS  . sevelamer carbonate  2.4 g Oral TID WC    Dialysis Orders: MWF [facility]   4h 76mn  2/2 bath   400/800  85kg    Hep 8600  R AVF -Calcitriol 2.0 mcg PO TIW -Sensipar 120 mg PO TIW -Venofer 50 mg IV weekly -Mircera 60 mcg IV q 2 weeks (Last dose 02/12/17)    Assessment/Plan: 1. AMS - waxing and waning severity, worse today. 2. ESRD - HD shortened yesterday due to clotted TDC, alteplase overnight, and HD today. 3. Anemia of CKD- stable, Hgb 11.3 on IV iron q week (wed) 4. Secondary hyperparathyroidism -  P 8.3, Ca 9.2, continue binders. 5. HTN/volume - BP variable, titrate down volume as tolerated. 6. Nutrition - alb 3.0, renal diet w/fluid restrictions, soft mechanical diet per speech/lang due to dysphagia. Renavite. 7. COPD - per primary 8. DM - per primary 9. Ankle frx - per primary 10. Deconditioning - inpatient rehab consulted and recommending SNF if unable to be cared for at home.  Jen Mow, PA-C [facility] Pager: [phone] 02/25/2017,10:43 AM  LOS: 6 days  I have seen and examined this patient and agree with  the plan of care seen, eval, examined, discussed with extender .  Jeneen Rinks Fredick Schlosser 02/26/2017, 8:14 AM

## 2017-02-26 ENCOUNTER — Inpatient Hospital Stay (HOSPITAL_COMMUNITY)
Admission: RE | Admit: 2017-02-26 | Discharge: 2017-03-05 | DRG: 945 | Disposition: A | Discharge status: Other Acute Inpatient Hospital | Payer: Medicare Other | Source: Intra-hospital | Attending: Physical Medicine & Rehabilitation | Admitting: Physical Medicine & Rehabilitation

## 2017-02-26 DIAGNOSIS — T829XXA Unspecified complication of cardiac and vascular prosthetic device, implant and graft, initial encounter: Secondary | ICD-10-CM

## 2017-02-26 DIAGNOSIS — S82832A Other fracture of upper and lower end of left fibula, initial encounter for closed fracture: Secondary | ICD-10-CM | POA: Diagnosis present

## 2017-02-26 DIAGNOSIS — Z716 Tobacco abuse counseling: Secondary | ICD-10-CM | POA: Diagnosis not present

## 2017-02-26 DIAGNOSIS — N2581 Secondary hyperparathyroidism of renal origin: Secondary | ICD-10-CM | POA: Diagnosis present

## 2017-02-26 DIAGNOSIS — B370 Candidal stomatitis: Secondary | ICD-10-CM | POA: Diagnosis not present

## 2017-02-26 DIAGNOSIS — R5381 Other malaise: Principal | ICD-10-CM | POA: Diagnosis present

## 2017-02-26 DIAGNOSIS — F17210 Nicotine dependence, cigarettes, uncomplicated: Secondary | ICD-10-CM | POA: Diagnosis present

## 2017-02-26 DIAGNOSIS — T8249XS Other complication of vascular dialysis catheter, sequela: Secondary | ICD-10-CM

## 2017-02-26 DIAGNOSIS — R1310 Dysphagia, unspecified: Secondary | ICD-10-CM | POA: Diagnosis present

## 2017-02-26 DIAGNOSIS — N390 Urinary tract infection, site not specified: Secondary | ICD-10-CM | POA: Diagnosis present

## 2017-02-26 DIAGNOSIS — Y838 Other surgical procedures as the cause of abnormal reaction of the patient, or of later complication, without mention of misadventure at the time of the procedure: Secondary | ICD-10-CM | POA: Diagnosis not present

## 2017-02-26 DIAGNOSIS — N186 End stage renal disease: Secondary | ICD-10-CM | POA: Diagnosis present

## 2017-02-26 DIAGNOSIS — I10 Essential (primary) hypertension: Secondary | ICD-10-CM | POA: Diagnosis not present

## 2017-02-26 DIAGNOSIS — D509 Iron deficiency anemia, unspecified: Secondary | ICD-10-CM | POA: Diagnosis present

## 2017-02-26 DIAGNOSIS — J449 Chronic obstructive pulmonary disease, unspecified: Secondary | ICD-10-CM | POA: Diagnosis present

## 2017-02-26 DIAGNOSIS — E1151 Type 2 diabetes mellitus with diabetic peripheral angiopathy without gangrene: Secondary | ICD-10-CM | POA: Diagnosis present

## 2017-02-26 DIAGNOSIS — G9341 Metabolic encephalopathy: Secondary | ICD-10-CM | POA: Diagnosis present

## 2017-02-26 DIAGNOSIS — E1122 Type 2 diabetes mellitus with diabetic chronic kidney disease: Secondary | ICD-10-CM | POA: Diagnosis present

## 2017-02-26 DIAGNOSIS — D631 Anemia in chronic kidney disease: Secondary | ICD-10-CM

## 2017-02-26 DIAGNOSIS — T829XXS Unspecified complication of cardiac and vascular prosthetic device, implant and graft, sequela: Secondary | ICD-10-CM

## 2017-02-26 DIAGNOSIS — R059 Cough, unspecified: Secondary | ICD-10-CM

## 2017-02-26 DIAGNOSIS — N189 Chronic kidney disease, unspecified: Secondary | ICD-10-CM

## 2017-02-26 DIAGNOSIS — B961 Klebsiella pneumoniae [K. pneumoniae] as the cause of diseases classified elsewhere: Secondary | ICD-10-CM | POA: Diagnosis present

## 2017-02-26 DIAGNOSIS — R05 Cough: Secondary | ICD-10-CM

## 2017-02-26 DIAGNOSIS — G9340 Encephalopathy, unspecified: Secondary | ICD-10-CM

## 2017-02-26 DIAGNOSIS — N185 Chronic kidney disease, stage 5: Secondary | ICD-10-CM

## 2017-02-26 DIAGNOSIS — T8249XA Other complication of vascular dialysis catheter, initial encounter: Secondary | ICD-10-CM | POA: Diagnosis not present

## 2017-02-26 DIAGNOSIS — Z992 Dependence on renal dialysis: Secondary | ICD-10-CM

## 2017-02-26 DIAGNOSIS — S82832D Other fracture of upper and lower end of left fibula, subsequent encounter for closed fracture with routine healing: Secondary | ICD-10-CM

## 2017-02-26 DIAGNOSIS — Z22322 Carrier or suspected carrier of Methicillin resistant Staphylococcus aureus: Secondary | ICD-10-CM | POA: Diagnosis not present

## 2017-02-26 DIAGNOSIS — S82822S Torus fracture of lower end of left fibula, sequela: Secondary | ICD-10-CM

## 2017-02-26 LAB — CBC
HEMATOCRIT: 31.8 % — AB (ref 39.0–52.0)
HEMOGLOBIN: 10.8 g/dL — AB (ref 13.0–17.0)
MCH: 34.1 pg — AB (ref 26.0–34.0)
MCHC: 34 g/dL (ref 30.0–36.0)
MCV: 100.3 fL — ABNORMAL HIGH (ref 78.0–100.0)
Platelets: 315 10*3/uL (ref 150–400)
RBC: 3.17 MIL/uL — ABNORMAL LOW (ref 4.22–5.81)
RDW: 14.2 % (ref 11.5–15.5)
WBC: 9.8 10*3/uL (ref 4.0–10.5)

## 2017-02-26 LAB — RENAL FUNCTION PANEL
ANION GAP: 18 — AB (ref 5–15)
Albumin: 3 g/dL — ABNORMAL LOW (ref 3.5–5.0)
BUN: 65 mg/dL — ABNORMAL HIGH (ref 6–20)
CALCIUM: 9.2 mg/dL (ref 8.9–10.3)
CO2: 21 mmol/L — AB (ref 22–32)
Chloride: 97 mmol/L — ABNORMAL LOW (ref 101–111)
Creatinine, Ser: 12.3 mg/dL — ABNORMAL HIGH (ref 0.61–1.24)
GFR calc Af Amer: 4 mL/min — ABNORMAL LOW (ref >60)
GFR calc non Af Amer: 4 mL/min — ABNORMAL LOW (ref >60)
GLUCOSE: 117 mg/dL — AB (ref 65–99)
Phosphorus: 8.6 mg/dL — ABNORMAL HIGH (ref 2.5–4.6)
Potassium: 3.7 mmol/L (ref 3.5–5.1)
SODIUM: 136 mmol/L (ref 135–145)

## 2017-02-26 LAB — GLUCOSE, CAPILLARY
GLUCOSE-CAPILLARY: 194 mg/dL — AB (ref 65–99)
Glucose-Capillary: 119 mg/dL — ABNORMAL HIGH (ref 65–99)
Glucose-Capillary: 119 mg/dL — ABNORMAL HIGH (ref 65–99)
Glucose-Capillary: 114 mg/dL — ABNORMAL HIGH (ref 65–99)
Glucose-Capillary: 110 mg/dL — ABNORMAL HIGH (ref 65–99)

## 2017-02-26 MED ORDER — HEPARIN SODIUM (PORCINE) 1000 UNIT/ML DIALYSIS: 1000.0000 [IU] | INTRAMUSCULAR | Status: DC | PRN

## 2017-02-26 MED ORDER — ALTEPLASE 2 MG IJ SOLR: 2.0000 mg | Freq: Once | INTRAMUSCULAR | Status: DC | PRN

## 2017-02-26 MED ORDER — NEPRO/CARBSTEADY PO LIQD
237.0000 mL | Freq: Two times a day (BID) | ORAL | Status: DC
  Administered 2017-02-26 – 2017-03-02 (×3): 237 mL via ORAL

## 2017-02-26 MED ORDER — HEPARIN SODIUM (PORCINE) 5000 UNIT/ML IJ SOLN
5000.0000 [IU] | Freq: Three times a day (TID) | INTRAMUSCULAR | Status: DC
  Administered 2017-02-26 – 2017-03-05 (×16): 5000 [IU] via SUBCUTANEOUS
  Filled 2017-02-26 (×19): qty 1

## 2017-02-26 MED ORDER — SODIUM CHLORIDE 0.9 % IV SOLN: 100.0000 mL | INTRAVENOUS | Status: DC | PRN

## 2017-02-26 MED ORDER — HEPARIN SODIUM (PORCINE) 1000 UNIT/ML DIALYSIS
1000.0000 [IU] | INTRAMUSCULAR | Status: DC | PRN
  Filled 2017-02-26: qty 1

## 2017-02-26 MED ORDER — INSULIN ASPART 100 UNIT/ML ~~LOC~~ SOLN
0.0000 [IU] | SUBCUTANEOUS | Status: DC
  Administered 2017-02-26 – 2017-02-27 (×3): 2 [IU] via SUBCUTANEOUS
  Administered 2017-02-27 (×2): 1 [IU] via SUBCUTANEOUS
  Administered 2017-02-27: 2 [IU] via SUBCUTANEOUS
  Administered 2017-02-27 – 2017-02-28 (×3): 1 [IU] via SUBCUTANEOUS
  Administered 2017-02-28: 2 [IU] via SUBCUTANEOUS
  Administered 2017-02-28: 1 [IU] via SUBCUTANEOUS

## 2017-02-26 MED ORDER — PENTAFLUOROPROP-TETRAFLUOROETH EX AERO: 1.0000 "application " | INHALATION_SPRAY | CUTANEOUS | Status: DC | PRN

## 2017-02-26 MED ORDER — DARBEPOETIN ALFA 100 MCG/0.5ML IJ SOSY
100.0000 ug | PREFILLED_SYRINGE | INTRAMUSCULAR | Status: DC
  Administered 2017-02-26: 100 ug via INTRAVENOUS

## 2017-02-26 MED ORDER — LIDOCAINE-PRILOCAINE 2.5-2.5 % EX CREA: 1.0000 "application " | TOPICAL_CREAM | CUTANEOUS | Status: DC | PRN

## 2017-02-26 MED ORDER — DARBEPOETIN ALFA 100 MCG/0.5ML IJ SOSY
100.0000 ug | PREFILLED_SYRINGE | INTRAMUSCULAR | Status: DC
  Filled 2017-02-26: qty 0.5

## 2017-02-26 MED ORDER — LIDOCAINE HCL (PF) 1 % IJ SOLN
5.0000 mL | INTRAMUSCULAR | Status: DC | PRN
  Filled 2017-02-26: qty 5

## 2017-02-26 MED ORDER — HALOPERIDOL 2 MG PO TABS: 2.0000 mg | ORAL_TABLET | Freq: Three times a day (TID) | ORAL | Status: DC | PRN

## 2017-02-26 MED ORDER — NEPRO/CARBSTEADY PO LIQD: 237.0000 mL | Freq: Two times a day (BID) | ORAL | 0 refills | Status: DC

## 2017-02-26 MED ORDER — HEPARIN SODIUM (PORCINE) 1000 UNIT/ML DIALYSIS
100.0000 [IU]/kg | INTRAMUSCULAR | Status: DC | PRN
  Filled 2017-02-26: qty 9

## 2017-02-26 MED ORDER — LIDOCAINE HCL (PF) 1 % IJ SOLN: 5.0000 mL | INTRAMUSCULAR | Status: DC | PRN

## 2017-02-26 MED ORDER — DARBEPOETIN ALFA 100 MCG/0.5ML IJ SOSY
PREFILLED_SYRINGE | INTRAMUSCULAR | Status: AC
  Filled 2017-02-26: qty 0.5

## 2017-02-26 MED ORDER — SODIUM CHLORIDE 0.9 % IV SOLN
62.5000 mg | INTRAVENOUS | Status: DC
  Filled 2017-02-26 (×2): qty 5

## 2017-02-26 MED ORDER — SEVELAMER CARBONATE 2.4 G PO PACK
2.4000 g | PACK | Freq: Three times a day (TID) | ORAL | Status: DC
  Administered 2017-02-26 – 2017-02-27 (×4): 2.4 g via ORAL
  Filled 2017-02-26 (×6): qty 1

## 2017-02-26 MED ORDER — PANTOPRAZOLE SODIUM 40 MG PO TBEC
40.0000 mg | DELAYED_RELEASE_TABLET | Freq: Every day | ORAL | Status: DC
  Administered 2017-02-26 – 2017-03-03 (×6): 40 mg via ORAL
  Filled 2017-02-26 (×6): qty 1

## 2017-02-26 MED ORDER — ACETAMINOPHEN 160 MG/5ML PO SOLN
650.0000 mg | Freq: Four times a day (QID) | ORAL | Status: DC | PRN
  Administered 2017-02-28: 650 mg
  Filled 2017-02-26: qty 20.3

## 2017-02-26 MED ORDER — LIDOCAINE-PRILOCAINE 2.5-2.5 % EX CREA
1.0000 "application " | TOPICAL_CREAM | CUTANEOUS | Status: DC | PRN
  Filled 2017-02-26: qty 5

## 2017-02-26 MED ORDER — DOCUSATE SODIUM 50 MG/5ML PO LIQD
100.0000 mg | Freq: Two times a day (BID) | ORAL | Status: DC
  Administered 2017-02-27 – 2017-03-05 (×12): 100 mg
  Filled 2017-02-26 (×13): qty 10

## 2017-02-26 MED ORDER — ONDANSETRON HCL 4 MG PO TABS: 4.0000 mg | ORAL_TABLET | Freq: Four times a day (QID) | ORAL | Status: DC | PRN

## 2017-02-26 MED ORDER — HEPARIN SODIUM (PORCINE) 1000 UNIT/ML DIALYSIS: 100.0000 [IU]/kg | INTRAMUSCULAR | Status: DC | PRN

## 2017-02-26 MED ORDER — DOCUSATE SODIUM 100 MG PO CAPS
100.0000 mg | ORAL_CAPSULE | Freq: Two times a day (BID) | ORAL | Status: DC
  Filled 2017-02-26: qty 1

## 2017-02-26 MED ORDER — HEPARIN SODIUM (PORCINE) 5000 UNIT/ML IJ SOLN: 5000.0000 [IU] | Freq: Three times a day (TID) | INTRAMUSCULAR | Status: DC

## 2017-02-26 MED ORDER — DOCUSATE SODIUM 100 MG PO CAPS: 100.0000 mg | ORAL_CAPSULE | Freq: Two times a day (BID) | ORAL | 0 refills | Status: DC

## 2017-02-26 MED ORDER — SEVELAMER CARBONATE 2.4 G PO PACK: 2.4000 g | PACK | Freq: Three times a day (TID) | ORAL | Status: DC

## 2017-02-26 MED ORDER — ONDANSETRON HCL 4 MG/2ML IJ SOLN: 4.0000 mg | Freq: Four times a day (QID) | INTRAMUSCULAR | Status: DC | PRN

## 2017-02-26 MED ORDER — SORBITOL 70 % SOLN: 30.0000 mL | Freq: Every day | Status: DC | PRN

## 2017-02-26 MED ORDER — RENA-VITE PO TABS
1.0000 | ORAL_TABLET | Freq: Every day | ORAL | Status: DC
  Administered 2017-02-26 – 2017-03-03 (×6): 1 via ORAL
  Filled 2017-02-26 (×6): qty 1

## 2017-02-26 NOTE — Progress Notes (Signed)
I met with pt at bedside in hemodialysis and discussed with Dr. Naaman Plummer. We can readmit pt to inpt rehab today. Pt is in agreement. I will contact his daughter by phone to make her aware of plan to readmit as she had requested. I contacted Dr. Carolin Sicks and he will d/c to CIR today. I will alert RN CM and SW and make the arrangements to admit today. [phone]
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
## 2017-02-26 NOTE — PMR Pre-admission (Signed)
PMR Admission Coordinator Pre-Admission Assessment  Patient: [name] is an 67 y.o., male MRN: [id number] DOB: 1951/02/01 Height: 6' 1"  (185.4 cm) Weight: 79.8 kg (175 lb 14.8 oz)              Insurance Information HMO:     PPO:      PCP:      IPA:      80/20:yes       OTHER: no HMO PRIMARY: Medicare a and b      Policy#: [id number]      Subscriber: pt Benefits:  Phone #: passport one online     Name: 02/26/2017 Eff. Date: 11/10/2007     Deduct: $1364      Out of Pocket Max: none      Life Max: none CIR: 100%      SNF: 20 full days Outpatient: 80%     Co-Pay: 20% Home Health: 100%      Co-Pay: none DME: 80%     Co-Pay: 20% Providers: pt choice  SECONDARY: BCBS State      Policy#: [id number]      Subscriber: pt policy termed 97/67/3419  Medicaid Application Date:       Case Manager:  Disability Application Date:       Case Worker:   Emergency Contact Information Contact Information    Name Relation Home Work Mobile   Boman,Monique Daughter [phone]       Current Medical History  Patient Admitting Diagnosis: encephalopathy; debility  History of Present Illness:  HPI: [name] a 67 y.o.right handed malewith history of end-stage renal disease with hemodialysis, hypertension, diabetes mellitus, COPD and tobacco abuse as well as recent left fibular fracture/nondisplaced was casted and nonweightbearing and followed by Dr. Doreatha Martin.Patient received inpatient rehabilitation services 02/03/2017 to 02/16/2017 withPRESand discharge to home with home health therapies arranged and limited by nonweightbearing status left lower extremity as well as requiring Max cues to maintain weightbearing status and moderate assist for bathing max assist for dressing. He was able to ambulate up to 50 feet with his weightbearing precautions using an assistive device and propel his wheelchair with supervision needing some cues for parts management. His daughter had been providing assistance at  home. Patient readmitted 02/19/2017 with elevated blood pressure 230/190 as well as altered mental status. Patient had missed his last dialysis session. Patient intubated for airway support. Cranial CT scan reviewed, unremarkable for acute process.Chest x-ray negative. Troponin 0.03, lactic acid within normal limits 1.5 ammonia level mildly elevated 42. He was extubated 02/23/2017. Close follow-up of blood pressure. Urine culture Klebsiella maintained on Ancef 02/23/2017 3 doses . MRSA PCR screening positive 02/19/2017 with contact precautions. Bouts of confusion suspect acute encephalopathy likely hypertension related. Subcutaneous heparin for DVT prophylaxis. Hemodialysis ongoing as per renal services.   Past Medical History  Past Medical History:  Diagnosis Date  . Arthritis    right hand  . Cancer [facility])    prostate  . Chronic kidney disease    Dialysis M/w/F, Jeneen Rinks  . COPD (chronic obstructive pulmonary disease) ([place])   . Diabetes mellitus    Type 2   . GERD (gastroesophageal reflux disease)   . High cholesterol   . Hypertension     Family History  family history includes CAD in his father; Heart failure in his mother.  Prior Rehab/Hospitalizations:  Has the patient had major surgery during 100 days prior to admission? No Just d/c'd from [facility] 02/16/2017. Readmitted to [facility] acute  02/19/2017  Current Medications   Current Facility-Administered Medications:  .  0.9 %  sodium chloride infusion, 250 mL, Intravenous, PRN, Minor, Grace Bushy, NP .  acetaminophen (TYLENOL) solution 650 mg, 650 mg, Per Tube, Q6H PRN, Anders Simmonds, MD, 650 mg at 02/22/17 0130 .  chlorhexidine gluconate (MEDLINE KIT) (PERIDEX) 0.12 % solution 15 mL, 15 mL, Mouth Rinse, BID, Renee Pain, MD, 15 mL at 02/25/17 2329 .  Darbepoetin Alfa (ARANESP) 100 MCG/0.5ML injection, , , ,  .  Darbepoetin Alfa (ARANESP) injection 100 mcg, 100 mcg, Intravenous, Q Fri-HD, Deterding, Jeneen Rinks, MD, 100 mcg at  02/26/17 1052 .  docusate sodium (COLACE) capsule 100 mg, 100 mg, Oral, BID, Rush Farmer, MD, 100 mg at 02/26/17 1254 .  feeding supplement (NEPRO CARB STEADY) liquid 237 mL, 237 mL, Oral, BID BM, Rosita Fire, MD, 237 mL at 02/26/17 1254 .  ferric gluconate (NULECIT) 62.5 mg in sodium chloride 0.9 % 100 mL IVPB, 62.5 mg, Intravenous, Q Wed-HD, Zeyfang, David, PA-C .  haloperidol (HALDOL) tablet 2 mg, 2 mg, Oral, Q8H PRN, Rosita Fire, MD, 2 mg at 02/25/17 1506 .  heparin injection 5,000 Units, 5,000 Units, Subcutaneous, Q8H, Rush Farmer, MD, 5,000 Units at 02/26/17 [phone] .  insulin aspart (novoLOG) injection 0-9 Units, 0-9 Units, Subcutaneous, Q4H, Katherine Roan, MD, 1 Units at 02/25/17 2045 .  MEDLINE mouth rinse, 15 mL, Mouth Rinse, QID, Renee Pain, MD, 15 mL at 02/26/17 0455 .  metoprolol tartrate (LOPRESSOR) injection 5 mg, 5 mg, Intravenous, Q6H PRN, Burgess Estelle, MD, 2.5 mg at 02/22/17 1420 .  multivitamin (RENA-VIT) tablet 1 tablet, 1 tablet, Oral, QHS, Deterding, Jeneen Rinks, MD, 1 tablet at 02/25/17 2302 .  pantoprazole (PROTONIX) EC tablet 40 mg, 40 mg, Oral, QHS, Rush Farmer, MD, 40 mg at 02/25/17 2304 .  sevelamer carbonate (RENVELA) powder PACK 2.4 g, 2.4 g, Oral, TID WC, Deterding, James, MD, 2.4 g at 02/26/17 1254  Patients Current Diet: DIET DYS 3 Room service appropriate? Yes; Fluid consistency: Thin Diet - low sodium heart healthy  Precautions / Restrictions Precautions Precautions: Fall Precaution Comments: Per initial ED note, pt is to be NWB and follow up with ortho in office  Other Brace/Splint: Short leg cast Restrictions Weight Bearing Restrictions: Yes LLE Weight Bearing: Non weight bearing Other Position/Activity Restrictions: Per initial ED note, pt to be NWB and follow up with ortho in office    Has the patient had 2 or more falls or a fall with injury in the past year?Yes  Prior Activity Level Community (5-7x/wk): prior to  last admit, was Independnet and driving himself to oupt dialyis. (d/c from CIR 02/16/17 was Supervision w/c level, Min assist 40)  Home Assistive Devices / [place] Devices/Equipment: Wheelchair(18 x 18 lightweight w/c, drop arm commode, wheelchair cushio) Home Equipment: Walker - 2 wheels, Bedside commode(drop arm commode, light weight w/c 18 x 18, w/c cushion, RW)  Prior Device Use: Indicate devices/aids used by the patient prior to current illness, exacerbation or injury? Walker and manuel wheelchair  Prior Functional Level Prior Function Level of Independence: Needs assistance(since d/c from [facility] 02/16/17) Gait / Transfers Assistance Needed: min assist ADL's / Homemaking Assistance Needed: needed assist Communication / Swallowing Assistance Needed: supervision   Prior to recent admit, pt was independent and driving self to out pt dialysis. Just d/[facility]'d from [facility] 02/16/2017   Self Care: Did the patient need help bathing, dressing, using the toilet or eating?  Needed  some help  Indoor Mobility: Did the patient need assistance with walking from room to room (with or without device)? Needed some help  Stairs: Did the patient need assistance with internal or external stairs (with or without device)? Needed some help  Functional Cognition: Did the patient need help planning regular tasks such as shopping or remembering to take medications? Needed some help  Current Functional Level Cognition  Arousal/Alertness: Awake/alert Overall Cognitive Status: History of cognitive impairments - at baseline Current Attention Level: Focused Orientation Level: Oriented to person, Disoriented to time, Disoriented to situation, Oriented to place Following Commands: Follows one step commands with increased time Safety/Judgement: Decreased awareness of safety, Decreased awareness of deficits    Extremity Assessment (includes Sensation/Coordination)  Upper Extremity Assessment: Defer to OT  evaluation  Lower Extremity Assessment: Generalized weakness, LLE deficits/detail LLE: Unable to fully assess due to immobilization    ADLs       Mobility  Overal bed mobility: Needs Assistance Bed Mobility: Supine to Sit Supine to sit: Max assist, +2 for physical assistance, HOB elevated General bed mobility comments: Patient able to initiate some LE movement to EOB with increased time but requires maximal assist to carry out task of trunk rotation and elevation to upright    Transfers  Overall transfer level: Needs assistance Transfers: Lateral/Scoot Transfers Sit to Stand: Mod assist, +2 physical assistance General transfer comment: Moderate assist of 2 with chuck pad to later scoot from bed to drop arm recliner, delayed processing for patient to follow commands JS:RPRX placement and positioning, however, once he reached chair, patient able to reposition himself without assist    Ambulation / Gait / Stairs / Wheelchair Mobility  Ambulation/Gait General Gait Details: deferred at this time 2/2 congition and safety    Posture / Balance      Special needs/care consideration BiPAP/CPAP n/a CPM  N/a Continuous Drip IV  N/a Dialysis hemodialysis MWF, [address] dialysis; daughter to Quar[employer] manager  N/a Oxygen  N/a Special Bed  N/a Trach Size  N/a Wound Vac n/a Skin RUE graft for hemodialysis Bowel mgmt: incontinent LBM 1/17.2019 Bladder mgmt:incontinent Diabetic mgmt yes pta Contact precautions for MRSA After d/c 02/16/17 from CIR, pt missed first out pt dialysis treatments due to constipation. Pt /family education needed for bowel management and compliance with out pt dialysis treatments.    Previous Home Environment Living Arrangements: (daughter , Beckie Busing, providing 24/7 assist since d/c from [place])  Lives With: Daughter Available Help at Discharge: Available 24 hours/day, Family Type of Home: House Home Layout: One level Home Access: Level entry Bathroom Shower/Tub:  Tub/shower unit, Architectural technologist: Standard Bathroom Accessibility: Yes How Accessible: Accessible via walker [place]: Yes Type of Home Care Services: Home PT, Home OT(SLP) [place] (if known): [facility] since d/c from [place] 02/16/2017 Additional Comments: clarified with daughter, Monique  Discharge Living Setting Plans for Discharge Living Setting: Patient's home Type of Home at Discharge: House Discharge Home Layout: One level Discharge Home Access: Stairs to enter Entrance Stairs-Rails: None Entrance Stairs-Number of Steps: 2-3 Discharge Bathroom Shower/Tub: Tub/shower unit, Curtain Discharge Bathroom Toilet: Standard Discharge Bathroom Accessibility: Yes How Accessible: Accessible via walker Does the patient have any problems obtaining your medications?: No  Social/Family/Support Systems Patient Roles: Parent Contact Information: Daughter: Beckie Busing [phone] Anticipated Caregiver: Daughter, Beckie Busing Anticipated Caregiver's Contact Information: see above Ability/Limitations of Caregiver: None per her report  Caregiver Availability: 24/7 Discharge Plan Discussed with Primary Caregiver: Yes Is Caregiver In Agreement  with Plan?: Yes Does Caregiver/Family have Issues with Lodging/Transportation while Pt is in Rehab?: No  Goals/Additional Needs Patient/Family Goal for Rehab: PT/OT/SLP: Mod I-Supervision  Expected length of stay: 10-14 days Cultural Considerations: None Dietary Needs: Carb. Mod. diet restrictions  Equipment Needs: TBD Special Service Needs: Hemodialysis Mon, Wed, Friday [address] dialyiss; daughter to transport Additional Information: Difficulty with weight bearing restrictions  Pt/Family Agrees to Admission and willing to participate: Yes Program Orientation Provided & Reviewed with Pt/Caregiver Including Roles  & Responsibilities: Yes Additional Information Needs: Patient connected with HD at [facility] M,W,F schedule    Decrease burden of Care through IP rehab admission: n/a  Possible need for SNF placement upon discharge:not anticipated  Patient Condition: This patient's medical and functional status has changed since the consult dated 02/24/2017 in which the Rehabilitation Physician documented that the patient was not appropriate for intensive rehabilitative care in an inpatient rehabilitation facility. Due to change in status and issues being addressed, patient's case has been discussed with Dr. Naaman Plummer and patient now appropriate for inpatient rehabilitation. Will admit to inpatient rehab today.  Preadmission Screen Completed By:  Cleatrice Burke, 02/26/2017 1:34 PM ______________________________________________________________________   Discussed status with Dr. Naaman Plummer on 02/26/2017 at  1312 and received telephone approval for admission today.  Admission Coordinator:  Cleatrice Burke, time 9753 Date 02/26/2017

## 2017-02-26 NOTE — Care Management Important Message (Signed)
Important Message  Patient Details  Name: [name] MRN: [id number] Date of Birth: 1950/10/14   Medicare Important Message Given:  Yes(provided pt with copy, attempted signature, pt unable to sign)    Erenest Rasher, RN 02/26/2017, 1:56 PM

## 2017-02-26 NOTE — Progress Notes (Signed)
Subjective: Interval History: has no complaint, doing ok.  Objective: Vital signs in last 24 hours: Temp:  [98 F (36.7 C)-98.6 F (37 C)] 98.6 F (37 C) (01/18 0408) Pulse Rate:  [82-91] 91 (01/18 0408) Resp:  [16-18] 16 (01/18 0408) BP: (135-169)/(55-93) 169/93 (01/18 0408) SpO2:  [98 %-99 %] 99 % (01/18 0408) Weight:  [83 kg (182 lb 15.7 oz)] 83 kg (182 lb 15.7 oz) (01/17 2027) Weight change: -2.3 kg (-1.1 oz)  Intake/Output from previous day: 01/17 0701 - 01/18 0700 In: 480 [P.O.:480] Out: 0  Intake/Output this shift: No intake/output data recorded.  General appearance: cooperative, no distress and slowed mentation Resp: diminished breath sounds bilaterally Chest wall: RIJ cath Cardio: S1, S2 normal and systolic murmur: systolic ejection 2/6, decrescendo at 2nd left intercostal space GI: soft, pos bs, liver down 4 cm Extremities: AVF RUA  Ox1  Lab Results: Recent Labs    02/24/17 0226 02/26/17 0736  WBC 12.9* 9.8  HGB 11.3* 10.8*  HCT 34.1* 31.8*  PLT 314 315   BMET:  Recent Labs    02/24/17 0226 02/26/17 0735  NA 138 136  K 4.1 3.7  CL 97* 97*  CO2 20* 21*  GLUCOSE 112* 117*  BUN 52* 65*  CREATININE 11.13* 12.30*  CALCIUM 9.2 9.2   No results for input(s): PTH in the last 72 hours. Iron Studies: No results for input(s): IRON, TIBC, TRANSFERRIN, FERRITIN in the last 72 hours.  Studies/Results: No results found.  I have reviewed the patient's current medications.  Assessment/Plan: 1 ESRD for HD 2 Anemia start esa 3 DM controlled 4 HPTH vit D 5 HTN lower vol may need amlod restarted soon 6 COPD 7 Enceph  Still serious issue 8 NONADHERENCE P HD, esa, mobilize , control bs, bp    LOS: 7 days   Jeneen Rinks Jessel Gettinger 02/26/2017,8:20 AM

## 2017-02-26 NOTE — Progress Notes (Signed)
PMR Pre-admission  Signed  Date of Service:  02/26/2017 12:59 PM       Related encounter: ED to Hosp-Admission (Discharged) from 02/19/2017 in [place]           [] Hide copied text  [] Hover for details   PMR Admission Coordinator Pre-Admission Assessment  Patient: [name] is an 67 y.o., male MRN: [id number] DOB: 1950-04-03 Height: 6' 1"  (185.4 cm) Weight: 79.8 kg (175 lb 14.8 oz)                                                                                                                                                  Insurance Information HMO:     PPO:      PCP:      IPA:      80/20:yes       OTHER: no HMO PRIMARY: Medicare a and b      Policy#: [id number]      Subscriber: pt Benefits:  Phone #: passport one online     Name: 02/26/2017 Eff. Date: 11/10/2007     Deduct: $1364      Out of Pocket Max: none      Life Max: none CIR: 100%      SNF: 20 full days Outpatient: 80%     Co-Pay: 20% Home Health: 100%      Co-Pay: none DME: 80%     Co-Pay: 20% Providers: pt choice  SECONDARY: BCBS State      Policy#: [id number]      Subscriber: pt policy termed 70/78/6754  Medicaid Application Date:       Case Manager:  Disability Application Date:       Case Worker:   Emergency Contact Information Contact Information    Name Relation Home Work Mobile   Robenson,Monique Daughter [phone]       Current Medical History  Patient Admitting Diagnosis: encephalopathy; debility  History of Present Illness:  [name] a 67 y.o.right handed malewith history of end-stage renal disease with hemodialysis, hypertension, diabetes mellitus, COPD and tobacco abuse as well as recent left fibular fracture/nondisplaced was casted and nonweightbearing and followed by Dr. Doreatha Martin.Patient received inpatient rehabilitation services 02/03/2017 to 02/16/2017 withPRESand discharge to home with home health therapies arranged and limited  by nonweightbearing status left lower extremity as well as requiring Max cues to maintain weightbearing status and moderate assist for bathing max assist for dressing.He was able to ambulate up to 50 feet with his weightbearing precautions using anassistive device and propel his wheelchair with supervision needing some cues for parts management.His daughter had been providing assistance at home. Patient readmitted 02/19/2017 with elevated blood pressure 230/190 as well as altered mental status. Patient had missed his last dialysis session. Patient intubated for airway support. Cranial CT scan reviewed, unremarkable  for acute process.Chest x-ray negative. Troponin 0.03, lactic acid within normal limits 1.5 ammonia level mildly elevated 42. He was extubated 02/23/2017. Close follow-up of blood pressure. Urine culture Klebsiella maintained on Ancef01/15/2019 3 doses .MRSA PCR screening positive 02/19/2017 with contact precautions. Bouts of confusion suspect acute encephalopathy likely hypertension related.Subcutaneous heparin for DVT prophylaxis.Hemodialysis ongoing as per renal services.   Past Medical History      Past Medical History:  Diagnosis Date  . Arthritis    right hand  . Cancer [facility])    prostate  . Chronic kidney disease    Dialysis M/w/F, [name]  . COPD (chronic obstructive pulmonary disease) ([place])   . Diabetes mellitus    Type 2   . GERD (gastroesophageal reflux disease)   . High cholesterol   . Hypertension     Family History  family history includes CAD in his father; Heart failure in his mother.  Prior Rehab/Hospitalizations:  Has the patient had major surgery during 100 days prior to admission? No Just d/c'd from [facility] CIR 02/16/2017. Readmitted to [facility] acute 02/19/2017  Current Medications   Current Facility-Administered Medications:  .  0.9 %  sodium chloride infusion, 250 mL, Intravenous, PRN, Minor, Grace Bushy, NP .  acetaminophen  (TYLENOL) solution 650 mg, 650 mg, Per Tube, Q6H PRN, Anders Simmonds, MD, 650 mg at 02/22/17 0130 .  chlorhexidine gluconate (MEDLINE KIT) (PERIDEX) 0.12 % solution 15 mL, 15 mL, Mouth Rinse, BID, Renee Pain, MD, 15 mL at 02/25/17 2329 .  Darbepoetin Alfa (ARANESP) 100 MCG/0.5ML injection, , , ,  .  Darbepoetin Alfa (ARANESP) injection 100 mcg, 100 mcg, Intravenous, Q Fri-HD, Deterding, Paul Rinks, MD, 100 mcg at 02/26/17 1052 .  docusate sodium (COLACE) capsule 100 mg, 100 mg, Oral, BID, Rush Farmer, MD, 100 mg at 02/26/17 1254 .  feeding supplement (NEPRO CARB STEADY) liquid 237 mL, 237 mL, Oral, BID BM, Rosita Fire, MD, 237 mL at 02/26/17 1254 .  ferric gluconate (NULECIT) 62.5 mg in sodium chloride 0.9 % 100 mL IVPB, 62.5 mg, Intravenous, Q Wed-HD, Zeyfang, David, PA-C .  haloperidol (HALDOL) tablet 2 mg, 2 mg, Oral, Q8H PRN, Rosita Fire, MD, 2 mg at 02/25/17 1506 .  heparin injection 5,000 Units, 5,000 Units, Subcutaneous, Q8H, Rush Farmer, MD, 5,000 Units at 02/26/17 [phone] .  insulin aspart (novoLOG) injection 0-9 Units, 0-9 Units, Subcutaneous, Q4H, Katherine Roan, MD, 1 Units at 02/25/17 2045 .  MEDLINE mouth rinse, 15 mL, Mouth Rinse, QID, Renee Pain, MD, 15 mL at 02/26/17 0455 .  metoprolol tartrate (LOPRESSOR) injection 5 mg, 5 mg, Intravenous, Q6H PRN, Burgess Estelle, MD, 2.5 mg at 02/22/17 1420 .  multivitamin (RENA-VIT) tablet 1 tablet, 1 tablet, Oral, QHS, Deterding, Paul Rinks, MD, 1 tablet at 02/25/17 2302 .  pantoprazole (PROTONIX) EC tablet 40 mg, 40 mg, Oral, QHS, Rush Farmer, MD, 40 mg at 02/25/17 2304 .  sevelamer carbonate (RENVELA) powder PACK 2.4 g, 2.4 g, Oral, TID WC, Deterding, James, MD, 2.4 g at 02/26/17 1254  Patients Current Diet: DIET DYS 3 Room service appropriate? Yes; Fluid consistency: Thin Diet - low sodium heart healthy  Precautions / Restrictions Precautions Precautions: Fall Precaution Comments: Per initial ED  note, pt is to be NWB and follow up with ortho in office  Other Brace/Splint: Short leg cast Restrictions Weight Bearing Restrictions: Yes LLE Weight Bearing: Non weight bearing Other Position/Activity Restrictions: Per initial ED note, pt to be NWB and follow up with  ortho in office    Has the patient had 2 or more falls or a fall with injury in the past year?Yes  Prior Activity Level Community (5-7x/wk): prior to last admit, was Independnet and driving himself to oupt dialyis. (d/c from CIR 02/16/17 was Supervision w/c level, Min assist 40)  Home Assistive Devices / [place] Devices/Equipment: Wheelchair(18 x 18 lightweight w/c, drop arm commode, wheelchair cushio) Home Equipment: Walker - 2 wheels, Bedside commode(drop arm commode, light weight w/c 18 x 18, w/c cushion, RW)  Prior Device Use: Indicate devices/aids used by the patient prior to current illness, exacerbation or injury? Walker and manuel wheelchair  Prior Functional Level Prior Function Level of Independence: Needs assistance(since d/c from CIR 02/16/17) Gait / Transfers Assistance Needed: min assist ADL's / Homemaking Assistance Needed: needed assist Communication / Swallowing Assistance Needed: supervision   Prior to recent admit, pt was independent and driving self to out pt dialysis. Just d/c'd from [facility] 02/16/2017   Self Care: Did the patient need help bathing, dressing, using the toilet or eating?  Needed some help  Indoor Mobility: Did the patient need assistance with walking from room to room (with or without device)? Needed some help  Stairs: Did the patient need assistance with internal or external stairs (with or without device)? Needed some help  Functional Cognition: Did the patient need help planning regular tasks such as shopping or remembering to take medications? Needed some help  Current Functional Level Cognition  Arousal/Alertness: Awake/alert Overall Cognitive Status:  History of cognitive impairments - at baseline Current Attention Level: Focused Orientation Level: Oriented to person, Disoriented to time, Disoriented to situation, Oriented to place Following Commands: Follows one step commands with increased time Safety/Judgement: Decreased awareness of safety, Decreased awareness of deficits    Extremity Assessment (includes Sensation/Coordination)  Upper Extremity Assessment: Defer to OT evaluation  Lower Extremity Assessment: Generalized weakness, LLE deficits/detail LLE: Unable to fully assess due to immobilization    ADLs       Mobility  Overal bed mobility: Needs Assistance Bed Mobility: Supine to Sit Supine to sit: Max assist, +2 for physical assistance, HOB elevated General bed mobility comments: Patient able to initiate some LE movement to EOB with increased time but requires maximal assist to carry out task of trunk rotation and elevation to upright    Transfers  Overall transfer level: Needs assistance Transfers: Lateral/Scoot Transfers Sit to Stand: Mod assist, +2 physical assistance General transfer comment: Moderate assist of 2 with chuck pad to later scoot from bed to drop arm recliner, delayed processing for patient to follow commands EM:VVKP placement and positioning, however, once he reached chair, patient able to reposition himself without assist    Ambulation / Gait / Stairs / Wheelchair Mobility  Ambulation/Gait General Gait Details: deferred at this time 2/2 congition and safety    Posture / Balance      Special needs/care consideration BiPAP/CPAP n/a CPM  N/a Continuous Drip IV  N/a Dialysis hemodialysis MWF, [address] dialysis; daughter to [employer] manager  N/a Oxygen  N/a Special Bed  N/a Trach Size  N/a Wound Vac n/a Skin RUE graft for hemodialysis Bowel mgmt: incontinent LBM 1/17.2019 Bladder mgmt:incontinent Diabetic mgmt yes pta Contact precautions for MRSA After d/c 02/16/17 from CIR, pt  missed first out pt dialysis treatments due to constipation. Pt /family education needed for bowel management and compliance with out pt dialysis treatments.    Previous Home Environment Living Arrangements: (daughter , Beckie Busing, providing 24/7  assist since d/c from CIR)  Lives With: Daughter Available Help at Discharge: Available 24 hours/day, Family Type of Home: House Home Layout: One level Home Access: Level entry Bathroom Shower/Tub: Tub/shower unit, Architectural technologist: Standard Bathroom Accessibility: Yes How Accessible: Accessible via walker Home Care Services: Yes Type of Home Care Services: Home PT, Home OT(SLP) [place] (if known): [facility] since d/c from [place] 02/16/2017 Additional Comments: clarified with daughter, Monique  Discharge Living Setting Plans for Discharge Living Setting: Patient's home Type of Home at Discharge: House Discharge Home Layout: One level Discharge Home Access: Stairs to enter Entrance Stairs-Rails: None Entrance Stairs-Number of Steps: 2-3 Discharge Bathroom Shower/Tub: Tub/shower unit, Curtain Discharge Bathroom Toilet: Standard Discharge Bathroom Accessibility: Yes How Accessible: Accessible via walker Does the patient have any problems obtaining your medications?: No  Social/Family/Support Systems Patient Roles: Parent Contact Information: Daughter: Beckie Busing [phone] Anticipated Caregiver: Daughter, Beckie Busing Anticipated Caregiver's Contact Information: see above Ability/Limitations of Caregiver: None per her report  Caregiver Availability: 24/7 Discharge Plan Discussed with Primary Caregiver: Yes Is Caregiver In Agreement with Plan?: Yes Does Caregiver/Family have Issues with Lodging/Transportation while Pt is in Rehab?: No  Goals/Additional Needs Patient/Family Goal for Rehab: PT/OT/SLP: Mod I-Supervision  Expected length of stay: 10-14 days Cultural Considerations: None Dietary Needs: Carb. Mod. diet  restrictions  Equipment Needs: TBD Special Service Needs: Hemodialysis Mon, Wed, Friday [address] dialyiss; daughter to transport Additional Information: Difficulty with weight bearing restrictions  Pt/Family Agrees to Admission and willing to participate: Yes Program Orientation Provided & Reviewed with Pt/Caregiver Including Roles  & Responsibilities: Yes Additional Information Needs: Patient connected with HD at [facility] M,W,F schedule   Decrease burden of Care through IP rehab admission: n/a  Possible need for SNF placement upon discharge:not anticipated  Patient Condition: This patient's medical and functional status has changed since the consult dated 02/24/2017 in which the Rehabilitation Physician documented that the patient was not appropriate for intensive rehabilitative care in an inpatient rehabilitation facility. Due to change in status and issues being addressed, patient's case has been discussed with Dr. Naaman Plummer and patient now appropriate for inpatient rehabilitation. Will admit to inpatient rehab today.  Preadmission Screen Completed By:  Cleatrice Burke, 02/26/2017 1:34 PM ______________________________________________________________________   Discussed status with Dr. Naaman Plummer on 02/26/2017 at  1312 and received telephone approval for admission today.  Admission Coordinator:  Cleatrice Burke, time 3976 Date 02/26/2017             Cosigned by: Meredith Staggers, MD at 02/26/2017 1:49 PM

## 2017-02-26 NOTE — Procedures (Signed)
I was present at this session.  I have reviewed the session itself and made appropriate changes.  HD via PC, flow 350, bp ^, lower vol. tol well.  Jeneen Rinks Jourdin Connors 1/18/20198:19 AM

## 2017-02-26 NOTE — Care Management Note (Signed)
Case Management Note  Patient Details  Name: [name] MRN: [id number] Date of Birth: March 27, 1950  Subjective/Objective:   Acute metabolic/hypertensive encephalopathy, acute resp failure with hypoxia, ESRD on hemodialysis                 Action/Plan: Scheduled dc to IP rehab. Pt unable to engage in conservation.   Expected Discharge Date:  02/26/17               Expected Discharge Plan:  IP Rehab Facility  In-House Referral:  Clinical Social Work  Discharge planning Services  CM Consult  Post Acute Care Choice:  NA Choice offered to:  NA  DME Arranged:  N/A DME Agency:  NA  HH Arranged:  NA HH Agency:  NA  Status of Service:  Completed, signed off  If discussed at [employer], dates discussed:    Additional Comments:  Erenest Rasher, RN 02/26/2017, 1:59 PM
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
## 2017-02-26 NOTE — Progress Notes (Signed)
PT Cancellation Note  Patient Details Name: [name] MRN: [id number] DOB: [birth year]   Cancelled Treatment:    Reason Eval/Treat Not Completed: Patient at procedure or test/unavailable. Pt at HD. PT will continue to f/u with pt as available.    [place] 02/26/2017, 9:12 AM

## 2017-02-26 NOTE — Discharge Summary (Signed)
Physician Discharge Summary  [name] EXH:[id number] DOB: [name] DOA: 02/19/2017  PCP: [name]  Admit date: 02/19/2017 Discharge date: 02/26/2017  Admitted From:home Disposition:CIR  Recommendations for Outpatient Follow-up:  1. Follow up with PCP in 1-2 weeks 2. Please obtain BMP/CBC in one week 3. Continue hemodialysis treatment.   Home Health:none Equipment/Devices:none Discharge Condition:stable CODE STATUS:full code Diet recommendation:Dysphagia 3 (mechanical soft);Thin liquid Liquids provided via: Cup Medication Administration: Crushed with puree Supervision: Patient able to self feed  Brief/Interim Summary: 67 year old male with history of hypertension, ESRD on hemodialysis,, COPD, left tibial closed fracture presented to ER on 02/19/2017 after last being seen normal at 2 AM on the day of admission.  His blood pressure noted to be 230/190.  In the ER patient's blood pressure was 200/170 with change in mental status.  He was intubated, CT scan of head for questionable intracerebral hemorrhage.  Patient was noncompliant with her outpatient rheumatology treatment.  He was admitted in ICU service.  Urine culture growing Klebsiella mix sensitive to cefazolin.  Patient was extubated.  Transferred to [facility] on 02/25/2017.  #Acute metabolic/hypertensive encephalopathy: Patient is a stable.  Not really oriented to place or time.  Continue to provide supportive care.  On Haldol for the management of intermittent agitation which is likely delirium.  #Acute respiratory failure with hypoxia/acute pulmonary edema with hypertensive crisis: Patient is now extubated.  Continue supportive care.    Currently in room air.  #Hypertensive emergency on admission: Blood pressure better controlled.  It was likely contributed by volume.  Monitor BP.  Avoid hypotensive episode.  #ESRD on hemodialysis: Patient missed hemodialysis prior to admission.  Continue hemodialysis per  nephrologist.  Monitor BMP, volume and electrolytes.  #Klebsiella likely for UTI: Unknown if this is colonization in a patient with ESRD.  Completed antibiotics course.  #Physical deconditioning: Going to see IR for inpatient rehab.  Discussed with the rehab today.  #Oropharyngeal dysphagia: Swallow evaluation ongoing.  Dysphagia level 3 diet.  #Type 2 diabetes: Monitor blood sugar level.  Not requiring insulin.  Recommend to follow with PCP.   Discharge Diagnoses:  Active Problems:   Respiratory failure (HCC)   Acute respiratory failure with hypoxemia (HCC)   Secondary hypertension   ESRD (end stage renal disease) ([place])   Diabetes mellitus type 2 in nonobese (HCC)   Encephalopathy   Tobacco abuse   Acute lower UTI    Discharge Instructions  Discharge Instructions    Call MD for:  difficulty breathing, headache or visual disturbances   Complete by:  As directed    Call MD for:  extreme fatigue   Complete by:  As directed    Call MD for:  hives   Complete by:  As directed    Call MD for:  persistant dizziness or light-headedness   Complete by:  As directed    Call MD for:  persistant nausea and vomiting   Complete by:  As directed    Call MD for:  severe uncontrolled pain   Complete by:  As directed    Call MD for:  temperature >100.4   Complete by:  As directed    Diet - low sodium heart healthy   Complete by:  As directed    Dysphagia 3 (mechanical soft);Thin liquid Liquids provided via: Cup Medication Administration: Crushed with puree Supervision: Patient able to self feed   Increase activity slowly   Complete by:  As directed      Allergies as of 02/26/2017  Reactions   Pravastatin Other (See Comments)   MYALGIAS, MUSCLE PAIN   Simvastatin Other (See Comments)   MYALGIAS, MUSCLE PAIN      Medication List    STOP taking these medications   calcium acetate 667 MG capsule Commonly known as:  PHOSLO   metoprolol succinate 25 MG 24 hr  tablet Commonly known as:  TOPROL-XL     TAKE these medications   acetaminophen 500 MG tablet Commonly known as:  TYLENOL Take 1 tablet (500 mg total) by mouth every 6 (six) hours as needed for mild pain or moderate pain.   albuterol 108 (90 Base) MCG/ACT inhaler Commonly known as:  PROVENTIL HFA;VENTOLIN HFA Inhale 1-2 puffs into the lungs every 6 (six) hours as needed for wheezing.   amLODipine 10 MG tablet Commonly known as:  NORVASC Take 1 tablet (10 mg total) by mouth daily.   atorvastatin 20 MG tablet Commonly known as:  LIPITOR Take 1 tablet (20 mg total) by mouth daily at 6 PM.   cholecalciferol 1000 units tablet Commonly known as:  VITAMIN D Take 1,000 Units by mouth daily.   cinacalcet 30 MG tablet Commonly known as:  SENSIPAR 120 mg, Oral, Custom (Once per day on Mon Wed Fri), What changed:    how much to take  how to take this  when to take this  additional instructions   docusate sodium 100 MG capsule Commonly known as:  COLACE Take 1 capsule (100 mg total) by mouth 2 (two) times daily.   feeding supplement (NEPRO CARB STEADY) Liqd Take 237 mLs by mouth 2 (two) times daily between meals.   folic acid 1 MG tablet Commonly known as:  FOLVITE Take 1 tablet (1 mg total) by mouth daily.   haloperidol 2 MG tablet Commonly known as:  HALDOL Take 1 tablet (2 mg total) by mouth every 8 (eight) hours as needed for agitation.   hydrALAZINE 25 MG tablet Commonly known as:  APRESOLINE Take 1 tablet (25 mg total) by mouth every 8 (eight) hours.   HYDROcodone-acetaminophen 5-325 MG tablet Commonly known as:  NORCO/VICODIN Take 1 tablet by mouth every 6 (six) hours as needed for moderate pain.   pantoprazole 40 MG tablet Commonly known as:  PROTONIX Take 1 tablet (40 mg total) by mouth daily.   sevelamer carbonate 2.4 g Pack Commonly known as:  RENVELA Take 2.4 g by mouth 3 (three) times daily with meals.   tamsulosin 0.4 MG Caps capsule Commonly  known as:  FLOMAX Take 1 capsule (0.4 mg total) by mouth daily.      Follow-up [place]. Schedule an appointment as soon as possible for a visit in 1 week(s).   Specialty:  General Practice Contact information: [address] [place] [address] [phone]          Allergies  Allergen Reactions  . Pravastatin Other (See Comments)    MYALGIAS, MUSCLE PAIN  . Simvastatin Other (See Comments)    MYALGIAS, MUSCLE PAIN    Consultations:  Admitted by [facility]  Nephrology  Inpatient rehab     Procedures/Studies: Intubated  Subjective: Seen and examined hematology notes.  Patient was alert awake and following simple commands.  Denies any pain.  Discharge Exam: Vitals:   02/26/17 1100 02/26/17 1130  BP: 128/84 135/88  Pulse: (!) 102 (!) 103  Resp:    Temp:    SpO2:     Vitals:   02/26/17 1000 02/26/17 1030 02/26/17  1100 02/26/17 1130  BP: (!) 143/81 139/85 128/84 135/88  Pulse: 83 92 (!) 102 (!) 103  Resp:      Temp:      TempSrc:      SpO2:      Weight:      Height:        General: Pt is alert, awake, not in acute distress Cardiovascular: RRR, S1/S2 +, no rubs, no gallops Respiratory: CTA bilaterally, no wheezing, no rhonchi Abdominal: Soft, NT, ND, bowel sounds + Extremities: no edema, no cyanosis Alert awake and oriented to himself only.   The results of significant diagnostics from this hospitalization (including imaging, microbiology, ancillary and laboratory) are listed below for reference.     Microbiology: Recent Results (from the past 240 hour(s))  Urine culture     Status: Abnormal   Collection Time: 02/19/17 12:21 PM  Result Value Ref Range Status   Specimen Description URINE, CATHETERIZED  Final   Special Requests Normal  Final   Culture MULTIPLE SPECIES PRESENT, SUGGEST RECOLLECTION (A)  Final   Report Status 02/20/2017 FINAL  Final  Culture, blood (routine x 2)     Status: None   Collection Time:  02/19/17  1:53 PM  Result Value Ref Range Status   Specimen Description BLOOD BLOOD LEFT WRIST  Final   Special Requests IN PEDIATRIC BOTTLE Blood Culture adequate volume  Final   Culture NO GROWTH 5 DAYS  Final   Report Status 02/24/2017 FINAL  Final  Culture, blood (routine x 2)     Status: None   Collection Time: 02/19/17  3:01 PM  Result Value Ref Range Status   Specimen Description BLOOD BLOOD RIGHT ARM  Final   Special Requests   Final    BOTTLES DRAWN AEROBIC AND ANAEROBIC Blood Culture adequate volume   Culture NO GROWTH 5 DAYS  Final   Report Status 02/24/2017 FINAL  Final  MRSA PCR Screening     Status: Abnormal   Collection Time: 02/19/17  5:22 PM  Result Value Ref Range Status   MRSA by PCR POSITIVE (A) NEGATIVE Final    Comment:        The GeneXpert MRSA Assay (FDA approved for NASAL specimens only), is one component of a comprehensive MRSA colonization surveillance program. It is not intended to diagnose MRSA infection nor to guide or monitor treatment for MRSA infections. RESULT CALLED TO, READ BACK BY AND VERIFIED WITH: MCROSS,RN @2220  02/19/17 BY LHOWARD   Culture, Urine     Status: Abnormal   Collection Time: 02/20/17 11:55 PM  Result Value Ref Range Status   Specimen Description URINE, CATHETERIZED  Final   Special Requests NONE  Final   Culture >=100,000 COLONIES/mL KLEBSIELLA PNEUMONIAE (A)  Final   Report Status 02/23/2017 FINAL  Final   Organism ID, Bacteria KLEBSIELLA PNEUMONIAE (A)  Final      Susceptibility   Klebsiella pneumoniae - MIC*    AMPICILLIN RESISTANT Resistant     CEFAZOLIN <=4 SENSITIVE Sensitive     CEFTRIAXONE <=1 SENSITIVE Sensitive     CIPROFLOXACIN <=0.25 SENSITIVE Sensitive     GENTAMICIN <=1 SENSITIVE Sensitive     IMIPENEM <=0.25 SENSITIVE Sensitive     NITROFURANTOIN 64 INTERMEDIATE Intermediate     TRIMETH/SULFA <=20 SENSITIVE Sensitive     AMPICILLIN/SULBACTAM 4 SENSITIVE Sensitive     PIP/TAZO <=4 SENSITIVE Sensitive      Extended ESBL NEGATIVE Sensitive     * >=100,000 COLONIES/mL KLEBSIELLA PNEUMONIAE  Labs: BNP (last 3 results) No results for input(s): BNP in the last 8760 hours. Basic Metabolic Panel: Recent Labs  Lab 02/20/17 0017 02/20/17 1642 02/21/17 0354 02/22/17 0547 02/23/17 0313 02/24/17 0226 02/26/17 0735  NA 128* 125* 130* 130* 134* 138 136  K 4.0 4.3 3.9 3.6 3.8 4.1 3.7  CL 94* 92* 93* 92* 95* 97* 97*  CO2 17* 17* 19* 19* 19* 20* 21*  GLUCOSE 71 104* 185* 139* 129* 112* 117*  BUN 50* 53* 30* 46* 31* 52* 65*  CREATININE 12.31* 13.01* 9.49* 11.69* 8.31* 11.13* 12.30*  CALCIUM 9.2 8.7*  8.8 8.4* 8.7* 9.2 9.2 9.2  MG 2.0  --   --  1.9 2.0 2.2  --   PHOS 3.7 4.6  --  6.4* 5.5* 8.3* 8.6*   Liver Function Tests: Recent Labs  Lab 02/20/17 1642 02/21/17 0354 02/24/17 0226 02/26/17 0735  AST  --  33 14*  --   ALT  --  15* 16*  --   ALKPHOS  --  50 45  --   BILITOT  --  0.8 0.9  --   PROT  --  6.1* 6.6  --   ALBUMIN 3.1* 3.1* 3.0* 3.0*   No results for input(s): LIPASE, AMYLASE in the last 168 hours. Recent Labs  Lab 02/19/17 1353  AMMONIA 42*   CBC: Recent Labs  Lab 02/20/17 0017 02/20/17 1642 02/22/17 0547 02/23/17 0313 02/24/17 0226 02/26/17 0736  WBC 10.0 12.6* 17.2* 17.5* 12.9* 9.8  NEUTROABS 6.8  --   --   --   --   --   HGB 10.8* 10.6* 11.5* 11.6* 11.3* 10.8*  HCT 33.0* 32.8* 34.6* 34.7* 34.1* 31.8*  MCV 102.5* 103.1* 103.6* 102.1* 103.0* 100.3*  PLT 184 241 236 294 314 315   Cardiac Enzymes: Recent Labs  Lab 02/19/17 1815 02/20/17 0017  TROPONINI 0.03* 0.03*   BNP: Invalid input(s): POCBNP CBG: Recent Labs  Lab 02/25/17 1113 02/25/17 1622 02/25/17 2024 02/26/17 0142 02/26/17 0408  GLUCAP 88 120* 128* 119* 119*   D-Dimer No results for input(s): DDIMER in the last 72 hours. Hgb A1c No results for input(s): HGBA1C in the last 72 hours. Lipid Profile No results for input(s): CHOL, HDL, LDLCALC, TRIG, CHOLHDL, LDLDIRECT in the last  72 hours. Thyroid function studies No results for input(s): TSH, T4TOTAL, T3FREE, THYROIDAB in the last 72 hours.  Invalid input(s): FREET3 Anemia work up No results for input(s): VITAMINB12, FOLATE, FERRITIN, TIBC, IRON, RETICCTPCT in the last 72 hours. Urinalysis    Component Value Date/Time   COLORURINE COLORLESS (A) 02/08/2017 1311   APPEARANCEUR CLEAR 02/08/2017 1311   LABSPEC 1.005 02/08/2017 1311   PHURINE 7.0 02/08/2017 1311   GLUCOSEU 50 (A) 02/08/2017 1311   HGBUR SMALL (A) 02/08/2017 1311   BILIRUBINUR NEGATIVE 02/08/2017 1311   KETONESUR NEGATIVE 02/08/2017 1311   PROTEINUR 100 (A) 02/08/2017 1311   UROBILINOGEN 0.2 09/08/2007 1345   NITRITE NEGATIVE 02/08/2017 1311   LEUKOCYTESUR NEGATIVE 02/08/2017 1311   Sepsis Labs Invalid input(s): PROCALCITONIN,  WBC,  LACTICIDVEN Microbiology Recent Results (from the past 240 hour(s))  Urine culture     Status: Abnormal   Collection Time: 02/19/17 12:21 PM  Result Value Ref Range Status   Specimen Description URINE, CATHETERIZED  Final   Special Requests Normal  Final   Culture MULTIPLE SPECIES PRESENT, SUGGEST RECOLLECTION (A)  Final   Report Status 02/20/2017 FINAL  Final  Culture, blood (routine x 2)  Status: None   Collection Time: 02/19/17  1:53 PM  Result Value Ref Range Status   Specimen Description BLOOD BLOOD LEFT WRIST  Final   Special Requests IN PEDIATRIC BOTTLE Blood Culture adequate volume  Final   Culture NO GROWTH 5 DAYS  Final   Report Status 02/24/2017 FINAL  Final  Culture, blood (routine x 2)     Status: None   Collection Time: 02/19/17  3:01 PM  Result Value Ref Range Status   Specimen Description BLOOD BLOOD RIGHT ARM  Final   Special Requests   Final    BOTTLES DRAWN AEROBIC AND ANAEROBIC Blood Culture adequate volume   Culture NO GROWTH 5 DAYS  Final   Report Status 02/24/2017 FINAL  Final  MRSA PCR Screening     Status: Abnormal   Collection Time: 02/19/17  5:22 PM  Result Value Ref  Range Status   MRSA by PCR POSITIVE (A) NEGATIVE Final    Comment:        The GeneXpert MRSA Assay (FDA approved for NASAL specimens only), is one component of a comprehensive MRSA colonization surveillance program. It is not intended to diagnose MRSA infection nor to guide or monitor treatment for MRSA infections. RESULT CALLED TO, READ BACK BY AND VERIFIED WITH: MCROSS,RN @2220  02/19/17 BY LHOWARD   Culture, Urine     Status: Abnormal   Collection Time: 02/20/17 11:55 PM  Result Value Ref Range Status   Specimen Description URINE, CATHETERIZED  Final   Special Requests NONE  Final   Culture >=100,000 COLONIES/mL KLEBSIELLA PNEUMONIAE (A)  Final   Report Status 02/23/2017 FINAL  Final   Organism ID, Bacteria KLEBSIELLA PNEUMONIAE (A)  Final      Susceptibility   Klebsiella pneumoniae - MIC*    AMPICILLIN RESISTANT Resistant     CEFAZOLIN <=4 SENSITIVE Sensitive     CEFTRIAXONE <=1 SENSITIVE Sensitive     CIPROFLOXACIN <=0.25 SENSITIVE Sensitive     GENTAMICIN <=1 SENSITIVE Sensitive     IMIPENEM <=0.25 SENSITIVE Sensitive     NITROFURANTOIN 64 INTERMEDIATE Intermediate     TRIMETH/SULFA <=20 SENSITIVE Sensitive     AMPICILLIN/SULBACTAM 4 SENSITIVE Sensitive     PIP/TAZO <=4 SENSITIVE Sensitive     Extended ESBL NEGATIVE Sensitive     * >=100,000 COLONIES/mL KLEBSIELLA PNEUMONIAE     Time coordinating discharge: 27 minutes  SIGNED:   Rosita Fire, MD  [facility] 02/26/2017, 12:16 PM  If 7PM-7AM, please contact night-coverage [email or web address] Password [email or web address]

## 2017-02-27 ENCOUNTER — Inpatient Hospital Stay (HOSPITAL_COMMUNITY): Payer: Medicare Other | Admitting: Physical Therapy

## 2017-02-27 ENCOUNTER — Inpatient Hospital Stay (HOSPITAL_COMMUNITY): Payer: Medicare Other | Admitting: Occupational Therapy

## 2017-02-27 ENCOUNTER — Inpatient Hospital Stay (HOSPITAL_COMMUNITY): Payer: Medicare Other

## 2017-02-27 LAB — GLUCOSE, CAPILLARY
GLUCOSE-CAPILLARY: 154 mg/dL — AB (ref 65–99)
GLUCOSE-CAPILLARY: 166 mg/dL — AB (ref 65–99)
Glucose-Capillary: 131 mg/dL — ABNORMAL HIGH (ref 65–99)
Glucose-Capillary: 135 mg/dL — ABNORMAL HIGH (ref 65–99)
Glucose-Capillary: 165 mg/dL — ABNORMAL HIGH (ref 65–99)
Glucose-Capillary: 168 mg/dL — ABNORMAL HIGH (ref 65–99)

## 2017-02-27 LAB — RENAL FUNCTION PANEL
ALBUMIN: 3.6 g/dL (ref 3.5–5.0)
ANION GAP: 18 — AB (ref 5–15)
BUN: 54 mg/dL — AB (ref 6–20)
CALCIUM: 9.9 mg/dL (ref 8.9–10.3)
CO2: 22 mmol/L (ref 22–32)
Chloride: 97 mmol/L — ABNORMAL LOW (ref 101–111)
Creatinine, Ser: 10.23 mg/dL — ABNORMAL HIGH (ref 0.61–1.24)
GFR calc Af Amer: 5 mL/min — ABNORMAL LOW (ref >60)
GFR, EST NON AFRICAN AMERICAN: 5 mL/min — AB (ref >60)
GLUCOSE: 130 mg/dL — AB (ref 65–99)
PHOSPHORUS: 6.6 mg/dL — AB (ref 2.5–4.6)
POTASSIUM: 3.9 mmol/L (ref 3.5–5.1)
Sodium: 137 mmol/L (ref 135–145)

## 2017-02-27 MED ORDER — TRAZODONE HCL 50 MG PO TABS
50.0000 mg | ORAL_TABLET | Freq: Every day | ORAL | Status: DC
  Administered 2017-02-28 – 2017-03-01 (×3): 50 mg via ORAL
  Filled 2017-02-27 (×3): qty 1

## 2017-02-27 NOTE — Plan of Care (Signed)
LTGs established 02/27/17
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
## 2017-02-27 NOTE — Progress Notes (Signed)
[place] [facility] PHYSICAL MEDICINE & REHABILITATION     PROGRESS NOTE    Subjective/Complaints: Pt restless overnight. Remains confused  ROS: Limited due to cognitive/behavioral   Objective: Vital Signs: Blood pressure (!) 143/85, pulse (!) 104, temperature 98.7 F (37.1 C), temperature source Oral, resp. rate 19, height 6\' 1"  (1.854 m), SpO2 99 %. No results found. Recent Labs    02/26/17 0736  WBC 9.8  HGB 10.8*  HCT 31.8*  PLT 315   Recent Labs    02/26/17 0735 02/27/17 0551  NA 136 137  K 3.7 3.9  CL 97* 97*  GLUCOSE 117* 130*  BUN 65* 54*  CREATININE 12.30* 10.23*  CALCIUM 9.2 9.9   CBG (last 3)  Recent Labs    02/26/17 1952 02/27/17 0005 02/27/17 0525  GLUCAP 194* 166* 131*    Wt Readings from Last 3 Encounters:  02/26/17 79.8 kg (175 lb 14.8 oz)  02/15/17 84.4 kg (186 lb 1.1 oz)  02/03/17 88.7 kg (195 lb 8.8 oz)    Physical Exam:  Head: Normocephalic.  Eyes: EOM are normal. Right eye exhibits discharge. Left eye exhibits no discharge.  Neck: Normal range of motion. Neck supple. No thyromegaly present.  Cardiovascular:.RRR without murmur. No JVD   Respiratory:  CTA Bilaterally without wheezes or rales. Normal effort  GI: Soft. Bowel sounds are normal. He exhibits no distension. There is no tenderness.  Skin. Left leg short leg cast in place  Musculoskeletal. He exhibits tenderness  Neurological: oriented to self. Confused.. Decreased insight and awareness. Limited problem solving. Strength in the upper extremity is grossly 4+ out of 5 proximal distal. Right Lower extremity strength 3+ to 4 out of 5 hip flexors knee extensors and 4-5 at the right ankle. Left leg remains limited by cast in place  Psych: flat, confused     Assessment/Plan: 1. Functional deficits secondary to debility and encephalopathy which require 3+ hours per day of interdisciplinary therapy in a comprehensive inpatient rehab setting. Physiatrist is providing close team supervision  and 24 hour management of active medical problems listed below. Physiatrist and rehab team continue to assess barriers to discharge/monitor patient progress toward functional and medical goals.  Function:  Bathing Bathing position      Bathing parts      Bathing assist        Upper Body Dressing/Undressing Upper body dressing                    Upper body assist        Lower Body Dressing/Undressing Lower body dressing                                  Lower body assist        Toileting Toileting          Toileting assist     Transfers Chair/bed transfer             Locomotion Ambulation           Wheelchair          Cognition Comprehension Comprehension assist level: Understands complex 90% of the time/cues 10% of the time  Expression Expression assist level: Expresses basic needs/ideas: With no assist  Social Interaction Social Interaction assist level: Interacts appropriately 90% of the time - Needs monitoring or encouragement for participation or interaction.  Problem Solving Problem solving assist level: Solves complex 90% of the time/cues <  10% of the time  Memory Memory assist level: More than reasonable amount of time   Medical Problem List and Plan:  1. Decreased functional mobility secondary to metabolic encephalopathy/debility as well as history of closed left distal fibular fracture-short leg cast and nonweightbearing  -Beginning therapies 2. DVT Prophylaxis/Anticoagulation: Subcutaneous heparin. Monitor for any bleeding episodes  3. Pain Management: Tylenol as needed  4. Mood: Provide emotional support  5. Neuropsych: This patient is not capable of making decisions on his own behalf. He has experienced notable cognitive decline from when he was previously discharged from rehab. Hopeful to see improvement with improved medical status, normalized sleep-wake.    Ordered sleep chart to better-assess sleep patterns 6.  Skin/Wound Care: Routine skin checks  7. Fluids/Electrolytes/Nutrition: Routine I&O's with follow-up chemistries with HD  8. End-stage renal disease. Continue hemodialysis as per renal services  9. Hypertension. Monitor with increased mobility  10. Klebsiella UTI. Ancef completed  11. Diabetes mellitus peripheral neuropathy. SSI. Check blood sugars before meals and at bedtime    -Borderline to fair control at present.  Follow for pattern 12. Acute on chronic anemia. Continue iron supplement  13. COPD with history of tobacco abuse. Provide counseling    LOS (Days) [place] T, MD 02/27/2017 8:53 AM

## 2017-02-27 NOTE — Progress Notes (Signed)
[name] Progress Note   Subjective:   Seen in room. Appears lethargic today, but wakes easily. Appears comfortable. S/p HD yesterday with 3L removed.  Objective Vitals:   02/26/17 1601 02/27/17 0520  BP: 125/85 (!) 143/85  Pulse: (!) 108 (!) 104  Resp: 19   Temp: 98.7 F (37.1 C)   TempSrc: Oral   SpO2: 100% 99%  Height: 6\' 1"  (1.854 m)    Physical Exam General: Well appearing, but lethargic. NAD Not oriented, not coop to exam , falls asleep immed after aroused Heart: RRR Gr  2/6 m Lungs: CTA anteriorly Abdomen: soft, non-tender Liver down 5 cm Extremities: No LE edema Dialysis Access: TDC in R chest and RUE AVF + bruit  Additional Objective Labs: Basic Metabolic Panel: Recent Labs  Lab 02/24/17 0226 02/26/17 0735 02/27/17 0551  NA 138 136 137  K 4.1 3.7 3.9  CL 97* 97* 97*  CO2 20* 21* 22  GLUCOSE 112* 117* 130*  BUN 52* 65* 54*  CREATININE 11.13* 12.30* 10.23*  CALCIUM 9.2 9.2 9.9  PHOS 8.3* 8.6* 6.6*   Liver Function Tests: Recent Labs  Lab 02/21/17 0354 02/24/17 0226 02/26/17 0735 02/27/17 0551  AST 33 14*  --   --   ALT 15* 16*  --   --   ALKPHOS 50 45  --   --   BILITOT 0.8 0.9  --   --   PROT 6.1* 6.6  --   --   ALBUMIN 3.1* 3.0* 3.0* 3.6   CBC: Recent Labs  Lab 02/20/17 1642 02/22/17 0547 02/23/17 0313 02/24/17 0226 02/26/17 0736  WBC 12.6* 17.2* 17.5* 12.9* 9.8  HGB 10.6* 11.5* 11.6* 11.3* 10.8*  HCT 32.8* 34.6* 34.7* 34.1* 31.8*  MCV 103.1* 103.6* 102.1* 103.0* 100.3*  PLT 241 236 294 314 315   Blood Culture    Component Value Date/Time   SDES URINE, CATHETERIZED 02/20/2017 2355   SPECREQUEST NONE 02/20/2017 2355   CULT >=100,000 COLONIES/mL KLEBSIELLA PNEUMONIAE (A) 02/20/2017 2355   REPTSTATUS 02/23/2017 FINAL 02/20/2017 2355   CBG: Recent Labs  Lab 02/26/17 1220 02/26/17 1737 02/26/17 1952 02/27/17 0005 02/27/17 0525  GLUCAP 114* 110* 194* 166* 131*   Medications: . sodium chloride    . sodium  chloride    . [START ON 03/03/2017] ferric gluconate (FERRLECIT/NULECIT) IV     . [START ON 03/05/2017] darbepoetin (ARANESP) injection - DIALYSIS  100 mcg Intravenous Q Fri-HD  . docusate  100 mg Per Tube BID  . feeding supplement (NEPRO CARB STEADY)  237 mL Oral BID BM  . heparin injection (subcutaneous)  5,000 Units Subcutaneous Q8H  . insulin aspart  0-9 Units Subcutaneous Q4H  . multivitamin  1 tablet Oral QHS  . pantoprazole  40 mg Oral QHS  . sevelamer carbonate  2.4 g Oral TID WC    Dialysis Orders: MWF at [facility]  Assessment/Plan: 1. Encephalopathy: Persistent issue. + UTI, finished abx. Back to CIR for ongoing rehab. Needs to improve to participate in rehab and to be candidate for outpatient HD.  Suspect hypoxic enceph from arrest. 2. ESRD: Back to MWF schedule for HD, next 1/21. strd heparin. 3. HTN/volume:  Improved now with HD. Continue home meds. 4. Anemia: Hgb 10.8, continue Aranesp 186mcg q Friday. 5. Secondary hyperparathyroidism: Ca ok, Phos improving. Continue binders. 6. Nutrition: Alb 3.6, improving. Continue protein supps. 7. Acute respiratory failure s/p intubation during [facility] admit, now resolved. 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   Jeneen Rinks Karlene Southard 02/27/2017, 12:31 PM
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
## 2017-02-28 ENCOUNTER — Ambulatory Visit (HOSPITAL_BASED_OUTPATIENT_CLINIC_OR_DEPARTMENT_OTHER): Payer: Medicare Other | Attending: Family Medicine

## 2017-02-28 ENCOUNTER — Inpatient Hospital Stay (HOSPITAL_COMMUNITY): Payer: Medicare Other | Admitting: Occupational Therapy

## 2017-02-28 LAB — GLUCOSE, CAPILLARY
GLUCOSE-CAPILLARY: 156 mg/dL — AB (ref 65–99)
GLUCOSE-CAPILLARY: 206 mg/dL — AB (ref 65–99)
Glucose-Capillary: 134 mg/dL — ABNORMAL HIGH (ref 65–99)
Glucose-Capillary: 147 mg/dL — ABNORMAL HIGH (ref 65–99)

## 2017-02-28 MED ORDER — SEVELAMER CARBONATE 800 MG PO TABS
1600.0000 mg | ORAL_TABLET | Freq: Three times a day (TID) | ORAL | Status: DC
  Administered 2017-02-28 – 2017-03-05 (×12): 1600 mg via ORAL
  Filled 2017-02-28 (×13): qty 2

## 2017-02-28 MED ORDER — INSULIN ASPART 100 UNIT/ML ~~LOC~~ SOLN
0.0000 [IU] | Freq: Three times a day (TID) | SUBCUTANEOUS | Status: DC
  Administered 2017-02-28: 2 [IU] via SUBCUTANEOUS
  Administered 2017-02-28: 3 [IU] via SUBCUTANEOUS
  Administered 2017-03-01: 2 [IU] via SUBCUTANEOUS
  Administered 2017-03-01 (×2): 1 [IU] via SUBCUTANEOUS
  Administered 2017-03-01 – 2017-03-02 (×2): 2 [IU] via SUBCUTANEOUS
  Administered 2017-03-02: 1 [IU] via SUBCUTANEOUS
  Administered 2017-03-02: 2 [IU] via SUBCUTANEOUS
  Administered 2017-03-03 – 2017-03-04 (×5): 1 [IU] via SUBCUTANEOUS
  Administered 2017-03-05: 2 [IU] via SUBCUTANEOUS

## 2017-02-28 NOTE — Progress Notes (Signed)
Occupational Therapy Session Note  Patient Details  Name: [name] MRN: [id number] Date of Birth: 08/22/1950  Today's Date: 02/28/2017 OT Individual Time: 3614-4315 OT Individual Time Calculation (min): 62 min    Short Term Goals: Week 1:  OT Short Term Goal 1 (Week 1): Pt will maintain unsupported sitting balance during ADL session with close supervision  OT Short Term Goal 2 (Week 1): Pt will initiate completion of 1 grooming task  OT Short Term Goal 3 (Week 1): Pt will complete UB dressing with Mod A  Skilled Therapeutic Interventions/Progress Updates:    Pt greeted supine in bed, unable to verbalize what he would like to do when given two choices. Max encouragement to try transfer to drop arm BSC to attempt voiding. Pt transitioning to EOB with Min A, Mod A for scooting forward. With OT and NT providing set up for squat pivot and slideboard transfers (keeping L LE elevated), pt unable to follow 1 step commands to initiate or participate in transfer. He resisted assistance provided by therapist. Pt frequently staring at window, silent, then lifting Rt hand and jerking it back and forth non- purposefully. After 40 minutes of encouragement, education, and attempting transfer, returned pt to supine. He was able to boost himself up in bed with HOB flat and tactile cues for hand placement. Pt perseverating on drinking water. Refusing to sit up to drink, and therefore HOB was raised for him to safely consume honey thickened juice and water. Pt able to hold cup with Lt hand and HOH assist, then bring teaspoons to mouth with Rt hand. Pt cued each sip to fully swallow and visibly did so. He coughed x4. During last few sips, OT brought teaspoon to mouth due to UE fatigue. At end of tx pt was repositioned for comfort in bed. He exhibited windswept tendency of LEs and was repositioned with props to improve alignment. Pt left with soft call bell, 4 bedrails up, bed placed in lowest position, and bed alarm  activated.   Therapy Documentation Precautions:  Precautions Precautions: Fall Required Braces or Orthoses: Other Brace/Splint Other Brace/Splint: Short leg cast Restrictions Weight Bearing Restrictions: Yes LLE Weight Bearing: Non weight bearing Other Position/Activity Restrictions: Per initial ED note, pt to be NWB and follow up with ortho in office  Vital Signs: Therapy Vitals Temp: 98 F (36.7 C) Temp Source: Oral Pulse Rate: 93 Resp: 18 BP: (!) 146/73 Patient Position (if appropriate): Lying Oxygen Therapy SpO2: 98 % O2 Device: Not Delivered Pain: No s/s pain during tx    ADL: ADL ADL Comments: Please see functional navigator for ADL status :    See Function Navigator for Current Functional Status.   Therapy/Group: Individual Therapy  Maysun Meditz A Kherington Meraz 02/28/2017, 4:00 PM

## 2017-02-28 NOTE — Progress Notes (Signed)
[facility] PHYSICAL MEDICINE & REHABILITATION     PROGRESS NOTE    Subjective/Complaints: Patient was restless early in the evening.  Added trazodone at bedtime which helped quite a bit.  Nurse reports that he slept the rest of the night  ROS: Limited due to cognitive/behavioral   Objective: Vital Signs: Blood pressure 135/65, pulse 90, temperature 97.6 F (36.4 C), temperature source Axillary, resp. rate 18, height 6\' 1"  (1.854 m), weight 83.6 kg (184 lb 4.9 oz), SpO2 93 %. No results found. Recent Labs    02/26/17 0736  WBC 9.8  HGB 10.8*  HCT 31.8*  PLT 315   Recent Labs    02/26/17 0735 02/27/17 0551  NA 136 137  K 3.7 3.9  CL 97* 97*  GLUCOSE 117* 130*  BUN 65* 54*  CREATININE 12.30* 10.23*  CALCIUM 9.2 9.9   CBG (last 3)  Recent Labs    02/27/17 2008 02/27/17 2252 02/28/17 0504  GLUCAP 168* 154* 134*    Wt Readings from Last 3 Encounters:  02/28/17 83.6 kg (184 lb 4.9 oz)  02/26/17 79.8 kg (175 lb 14.8 oz)  02/15/17 84.4 kg (186 lb 1.1 oz)    Physical Exam:  Head: Normocephalic.  Eyes: EOM are normal. Right eye exhibits discharge. Left eye exhibits no discharge.  Neck: Normal range of motion. Neck supple. No thyromegaly present.  Cardiovascular:.RRR without murmur. No JVD    Respiratory: CTA Bilaterally without wheezes or rales. Normal effort  GI: Soft. Bowel sounds are normal. He exhibits no distension. There is no tenderness.  Skin. Left leg short leg cast in place  Musculoskeletal. He exhibits tenderness  Neurological: Remains confused. Decreased insight and awareness. Limited problem solving. Strength in the upper extremity is grossly 4+ out of 5 proximal distal. Right Lower extremity strength 3+ to 4 out of 5 hip flexors knee extensors and 4-5 at the right ankle. Left leg remains limited by cast in place  Psych: flat, confused     Assessment/Plan: 1. Functional deficits secondary to debility and encephalopathy which require 3+ hours per day  of interdisciplinary therapy in a comprehensive inpatient rehab setting. Physiatrist is providing close team supervision and 24 hour management of active medical problems listed below. Physiatrist and rehab team continue to assess barriers to discharge/monitor patient progress toward functional and medical goals.  Function:  Bathing Bathing position   Position: Sitting EOB  Bathing parts Body parts bathed by patient: Right arm, Left arm, Chest, Abdomen, Front perineal area Body parts bathed by helper: Buttocks, Right upper leg, Left upper leg, Right lower leg, Left lower leg, Back  Bathing assist        Upper Body Dressing/Undressing Upper body dressing   What is the patient wearing?: Hospital gown                Upper body assist Assist Level: Touching or steadying assistance(Pt > 75%)      Lower Body Dressing/Undressing Lower body dressing   What is the patient wearing?: Non-skid slipper socks           Non-skid slipper socks- Performed by helper: Don/doff right sock                  Lower body assist        Toileting Toileting     Toileting steps completed by helper: Adjust clothing prior to toileting, Performs perineal hygiene, Adjust clothing after toileting Toileting Assistive Devices: Grab bar or rail  Toileting assist Assist level: (  Total A)   Transfers Chair/bed transfer Chair/bed transfer activity did not occur: Safety/medical concerns           Locomotion Ambulation Ambulation activity did not occur: Safety/medical Editor, commissioning activity did not occur: Safety/medical concerns Type: Manual      Cognition Comprehension Comprehension assist level: Understands basic 25 - 49% of the time/ requires cueing 50 - 75% of the time  Expression Expression assist level: Expresses basic 25 - 49% of the time/requires cueing 50 - 75% of the time. Uses single words/gestures.  Social Interaction Social Interaction assist  level: Interacts appropriately 25 - 49% of time - Needs frequent redirection.  Problem Solving Problem solving assist level: Solves basic 25 - 49% of the time - needs direction more than half the time to initiate, plan or complete simple activities  Memory Memory assist level: Recognizes or recalls 25 - 49% of the time/requires cueing 50 - 75% of the time   Medical Problem List and Plan:  1. Decreased functional mobility secondary to metabolic encephalopathy/debility as well as history of closed left distal fibular fracture-short leg cast and nonweightbearing  -Continue therapies 2. DVT Prophylaxis/Anticoagulation: Subcutaneous heparin. Monitor for any bleeding episodes  3. Pain Management: Tylenol as needed  4. Mood: Provide emotional support  5. Neuropsych: This patient is not capable of making decisions on his own behalf. He has experienced notable cognitive decline from when he was previously discharged from rehab. Hopeful to see improvement with improved medical status, normalized sleep-wake.    Continue sleep chart to better-assess sleep patterns   -Added trazodone last night which appears to have been effective 6. Skin/Wound Care: Routine skin checks  7. Fluids/Electrolytes/Nutrition: Routine I&O's with follow-up chemistries with HD  8. End-stage renal disease. Continue hemodialysis as per renal services  9. Hypertension. Monitor with increased mobility  10. Klebsiella UTI. Ancef completed  11. Diabetes mellitus peripheral neuropathy. SSI.   blood sugars before meals and at bedtime    -Fair control at present.    12. Acute on chronic anemia. Continue iron supplement  13. COPD with history of tobacco abuse. Provide counseling    LOS (Days) 2 A [place] T, MD 02/28/2017 8:29 AM

## 2017-02-28 NOTE — Progress Notes (Signed)
Subjective: Interval History: has no complaint .  Objective: Vital signs in last 24 hours: Temp:  [97.6 F (36.4 C)-98 F (36.7 C)] 97.6 F (36.4 C) (01/20 0507) Pulse Rate:  [90-102] 90 (01/20 0507) Resp:  [17-18] 18 (01/20 0507) BP: (135-151)/(65-86) 135/65 (01/20 0507) SpO2:  [93 %-97 %] 93 % (01/20 0507) Weight:  [83.6 kg (184 lb 4.9 oz)] 83.6 kg (184 lb 4.9 oz) (01/20 0507) Weight change:   Intake/Output from previous day: 01/19 0701 - 01/20 0700 In: 220 [P.O.:220] Out: -  Intake/Output this shift: No intake/output data recorded.  General appearance: difficult to arrouse, Ox1 Resp: diminished breath sounds bilaterally and wheezes bilaterally Cardio: S1, S2 normal and systolic murmur: systolic ejection 2/6, decrescendo at 2nd left intercostal space GI: soft, non-tender; bowel sounds normal; no masses,  no organomegaly Extremities: AVF RUA , RIJ PC  Lab Results: Recent Labs    02/26/17 0736  WBC 9.8  HGB 10.8*  HCT 31.8*  PLT 315   BMET:  Recent Labs    02/26/17 0735 02/27/17 0551  NA 136 137  K 3.7 3.9  CL 97* 97*  CO2 21* 22  GLUCOSE 117* 130*  BUN 65* 54*  CREATININE 12.30* 10.23*  CALCIUM 9.2 9.9   No results for input(s): PTH in the last 72 hours. Iron Studies: No results for input(s): IRON, TIBC, TRANSFERRIN, FERRITIN in the last 72 hours.  Studies/Results: No results found.  I have reviewed the patient's current medications.  Assessment/Plan: 1 ESRD stable, for HD tomorrow 2 HTN controlled' 3 DM 4 Anemia esa 5 HPTH 6 Enceph ?? Not getting better 7 COPD 8 Resp failure ? Brain damage 9 Ankle fx P HD, esa, attempts at rehab, control bs    LOS: 2 days   [name] [name] 02/28/2017,9:47 AM

## 2017-03-01 ENCOUNTER — Inpatient Hospital Stay (HOSPITAL_COMMUNITY): Payer: Medicare Other | Admitting: Physical Therapy

## 2017-03-01 ENCOUNTER — Inpatient Hospital Stay (HOSPITAL_COMMUNITY): Payer: Medicare Other | Admitting: Occupational Therapy

## 2017-03-01 ENCOUNTER — Inpatient Hospital Stay (HOSPITAL_COMMUNITY): Payer: Medicare Other | Admitting: Speech Pathology

## 2017-03-01 LAB — GLUCOSE, CAPILLARY
GLUCOSE-CAPILLARY: 146 mg/dL — AB (ref 65–99)
Glucose-Capillary: 151 mg/dL — ABNORMAL HIGH (ref 65–99)
Glucose-Capillary: 163 mg/dL — ABNORMAL HIGH (ref 65–99)
Glucose-Capillary: 139 mg/dL — ABNORMAL HIGH (ref 65–99)

## 2017-03-01 MED ORDER — RESOURCE THICKENUP CLEAR PO POWD
ORAL | Status: DC | PRN
  Filled 2017-03-01: qty 125

## 2017-03-01 NOTE — Progress Notes (Signed)
Speech Language Pathology Daily Session Notes  Patient Details  Name: [name] MRN: [id number] Date of Birth: 07/09/1950  Today's Date: 03/01/2017  Session 1: SLP Individual Time: 4098-1191 SLP Individual Time Calculation (min): 40 min  Session 2: SLP Individual Time: 4782-9562 SLP Individual Time Calculation (min): 15  min  Short Term Goals: Week 1: SLP Short Term Goal 1 (Week 1): Pt will follow 1-step commands within or outside of context given min A visual cues. SLP Short Term Goal 2 (Week 1): Pt will answer basic yes/no questions given supervision question cues. SLP Short Term Goal 3 (Week 1): Pt will tolerate Dysphagia 1 diet with honey-thick liquids via tsp without clinical s/sx of aspiration across 3 sessions. SLP Short Term Goal 4 (Week 1): Pt will answer orientation questions given max A verbal cues.   Skilled Therapeutic Interventions:  Session 1: Skilled treatment session focused on cognitive and dysphagia goals. Upon arrival, patient was supine in bed and appeared lethargic. SLP facilitated session by providing extra time and Max A verbal cues for patient to answer basic biographical information and total A to make a choice from a field of 2.  Patient consumed honey-thick liquids via tsp and Dys. 1 textures with prolonged AP transit and what appeared to be a delayed swallow initiation. However, no overt s/s of aspiration noted. Patient also consumed honey-thick liquids via cup with what appeared to be a more timely swallow initiation and delayed cough X 2. However, patient has baseline coughing which makes it difficult to differentiate. Patient with immediate coughing after nectar thick liquids via tsp. Recommend trials of  honey-thick liquids via cup with lunch tray prior to upgrade and MBS tomorrow to objectively assess swallowing function. Throughout session, patient demonstrated a hoarse vocal quality and required Max A verbal cues for sustained attention/initiation with basic  and functional tasks. Patient missed remaining 20 minutes of session due to fatigue. Patient left supine in bed with alarm on and all needs within reach. Continue with current plan of care.   Session 2: Skilled treatment session focused on cognitive and dysphagia goals. Patient agreeable to consume lunch meal. SLP facilitated session by repositioning patient with total A to follow commands with patient intermittently "swatting" at clinician. Patient continuously scooting down the bed in attempts to rest and was repositioned multiple times. Patient then declined his lunch meal. Patient with minimal verbal output and utilized vocalizations to communicate with clinician. Patient left supine in bed with all needs within reach. Continue with current plan of care.   Function:  Eating Eating   Modified Consistency Diet: Yes Eating Assist Level: Supervision or verbal cues;More than reasonable amount of time;Set up assist for   Eating Set Up Assist For: Opening containers       Cognition Comprehension Comprehension assist level: Understands basic 25 - 49% of the time/ requires cueing 50 - 75% of the time  Expression   Expression assist level: Expresses basic 25 - 49% of the time/requires cueing 50 - 75% of the time. Uses single words/gestures.  Social Interaction Social Interaction assist level: Interacts appropriately 25 - 49% of time - Needs frequent redirection.  Problem Solving Problem solving assist level: Solves basic 25 - 49% of the time - needs direction more than half the time to initiate, plan or complete simple activities  Memory Memory assist level: Recognizes or recalls 25 - 49% of the time/requires cueing 50 - 75% of the time    Pain No/Denies Pain   Therapy/Group: Individual  Therapy  Polk Minor 03/01/2017, 8:49 AM

## 2017-03-01 NOTE — Plan of Care (Signed)
  RH SKIN INTEGRITY RH STG MAINTAIN SKIN INTEGRITY WITH ASSISTANCE Description STG Maintain Skin Integrity With Total Assistance.  03/01/2017 1910 - Progressing by Dietrich Pates, RN   RH SAFETY RH STG ADHERE TO SAFETY PRECAUTIONS W/ASSISTANCE/DEVICE Description STG Adhere to Safety Precautions With Moderate Assistance/Device.  03/01/2017 1910 - Progressing by Dietrich Pates, RN   RH COGNITION-NURSING RH STG USES MEMORY AIDS/STRATEGIES W/ASSIST TO PROBLEM SOLVE Description STG Uses Memory Aids/Strategies With Moderate Assistance to Problem Solve.  03/01/2017 1910 - Progressing by Dietrich Pates, RN

## 2017-03-01 NOTE — Progress Notes (Signed)
Physical Therapy Session Note  Patient Details  Name: [name] MRN: [id number] Date of Birth: [birth year]  Today's Date: 03/01/2017 PT Individual Time: 0935-1001 and 4268-3419 PT Individual Time Calculation (min): 26 min and 41 min   Short Term Goals: Week 1:  PT Short Term Goal 1 (Week 1): Pt will transfer bed<>chair w/ LRAD Max assist x1  PT Short Term Goal 2 (Week 1): Pt will maintain WB precautions in LLE w/ min cues 50% of time  PT Short Term Goal 3 (Week 1): Pt will initiate gait training  PT Short Term Goal 4 (Week 1): Pt will perform bed mobility w/ Mod assist  PT Short Term Goal 5 (Week 1): Pt will self-propel w/c 25' w/ min assist   Skilled Therapeutic Interventions/Progress Updates:  Treatment 1: Pt received in bed & appears lethargic. No behaviors noting pain. Pt observed to be slightly incontinent of urine and performed rolling L<>R with max multimodal cuing and mod assist for rolling to allow therapist to change brief total assist. Pt unable to maintain NWB LLE during task despite cuing. Attempted to instruct pt to transfer to sitting EOB but pt unable to do so 2/2 decreased attention to task (<5 seconds) and lethargy. Therapist scooted pt to [facility] total assist as pt unable to assist 2/2 deficits noted prior. During session pt mumbling unintelligible words. At end of session pt left in bed with alarm set & all needs within reach.   Treatment 2: Pt received in bed and had no behaviors demonstrating pain during session. Provided pt with w/c cushion for increased comfort when OOB. Pt performed rolling L<>R with max assist overall as pt only able to follow one step commands inconsistently to assist with rolling. Therapist and NT placed sling total assist and pt transferred bed>w/c via maximove total assist. Once in w/c pt set up at sink to perform hand hygiene; pt required max assist with occasional HOH assist to perform hand washing. Pt with very little verbal communication throughout  session, except saying "thank you" at end. At end of session pt left sitting in w/c at nurses station with [place] donned.   Therapy Documentation Precautions:  Precautions Precautions: Fall Required Braces or Orthoses: Other Brace/Splint Other Brace/Splint: Short leg cast Restrictions Weight Bearing Restrictions: Yes LLE Weight Bearing: Non weight bearing Other Position/Activity Restrictions: Per initial ED note, pt to be NWB and follow up with ortho in office    See Function Navigator for Current Functional Status.   Therapy/Group: Individual Therapy  [name] 03/01/2017, 3:17 PM

## 2017-03-01 NOTE — Progress Notes (Signed)
Occupational Therapy Session Note  Patient Details  Name: [name] MRN: [id number] Date of Birth: May 11, 1950  Today's Date: 03/01/2017 OT Individual Time: 0705-0800 OT Individual Time Calculation (min): 55 min    Short Term Goals: Week 1:  OT Short Term Goal 1 (Week 1): Pt will maintain unsupported sitting balance during ADL session with close supervision  OT Short Term Goal 2 (Week 1): Pt will initiate completion of 1 grooming task  OT Short Term Goal 3 (Week 1): Pt will complete UB dressing with Mod A  Skilled Therapeutic Interventions/Progress Updates:    Upon entering the room, pt supine in bed sleeping but agreeable to OT intervention this session. Pt required max multimodal cues to initiate and sequence supine >sit with pt requiring max A for B LE's and trunk. Pt seated on EOB for 30 minutes with close supervision - steady assistance for balance with meal. Pt required assistance to open containers and cues to follow swallowing precautions. Pt also required cues to utilize proper utensil for meal as pt was attempting to use knife to feed self sausage and fork to feed self liquids. Pt refusing to attempt transfer this session. He sat on EOB and mumbled incoherent words. Pt did not cough while eating meal. Pt required max A for sit >supine as well with increased time to initiate movement. Pt in bed with HOB elevated and call bell within reach.  Bed alarm activated.   Therapy Documentation Precautions:  Precautions Precautions: Fall Required Braces or Orthoses: Other Brace/Splint Other Brace/Splint: Short leg cast Restrictions Weight Bearing Restrictions: Yes LLE Weight Bearing: Non weight bearing Other Position/Activity Restrictions: Per initial ED note, pt to be NWB and follow up with ortho in office  ADL: ADL ADL Comments: Please see functional navigator for ADL status    See Function Navigator for Current Functional Status.   Therapy/Group: Individual Therapy  [name] 03/01/2017, 11:17 AM

## 2017-03-01 NOTE — Progress Notes (Signed)
[facility]ILIT[facility]     PROGRESS NOTE    Subjective/Complaints: Last night generally unremarkable.  Restless at times but did not sleep a good portion of the evening.  ROS: pt denies nausea, vomiting, diarrhea, cough, shortness of breath or chest pain   Objective: Vital Signs: Blood pressure (!) 153/78, pulse 93, temperature (!) 97.5 F (36.4 C), temperature source Oral, resp. rate 16, height 6\' 1"  (1.854 m), weight 83.8 kg (184 lb 11.9 oz), SpO2 97 %. No results found. No results for input(s): WBC, HGB, HCT, PLT in the last 72 hours. Recent Labs    02/27/17 0551  NA 137  K 3.9  CL 97*  GLUCOSE 130*  BUN 54*  CREATININE 10.23*  CALCIUM 9.9   CBG (last 3)  Recent Labs    02/28/17 1637 02/28/17 2057 03/01/17 0656  GLUCAP 156* 206* 146*    Wt Readings from Last 3 Encounters:  03/01/17 83.8 kg (184 lb 11.9 oz)  02/26/17 79.8 kg (175 lb 14.8 oz)  02/15/17 84.4 kg (186 lb 1.1 oz)    Physical Exam:  Head: Normocephalic.  Eyes: EOM are normal. Right eye exhibits discharge. Left eye exhibits no discharge.  Neck: Normal range of motion. Neck supple. No thyromegaly present.  Cardiovascular:.RRR without murmur. No JVD     Respiratory: CTA Bilaterally without wheezes or rales. Normal effort   GI: Soft. Bowel sounds are normal. He exhibits no distension. There is no tenderness.  Skin. Left leg short leg cast in place  Musculoskeletal. He exhibits tenderness  Neurological: Confused.  Slow to arouse this morning. Decreased insight and awareness. Limited problem solving. Strength in the upper extremity is grossly 4+ out of 5 proximal distal. Right Lower extremity strength 3+ to 4 out of 5 hip flexors knee extensors and 4-5 at the right ankle. Left leg remains limited by cast in place  Psych: flat, confused     Assessment/Plan: 1. Functional deficits secondary to debility and encephalopathy which require 3+ hours per day of interdisciplinary therapy  in a comprehensive inpatient rehab setting. Physiatrist is providing close team supervision and 24 hour management of active medical problems listed below. Physiatrist and rehab team continue to assess barriers to discharge/monitor patient progress toward functional and medical goals.  Function:  Bathing Bathing position   Position: Sitting EOB  Bathing parts Body parts bathed by patient: Right arm, Left arm, Chest, Abdomen, Front perineal area Body parts bathed by helper: Buttocks, Right upper leg, Left upper leg, Right lower leg, Left lower leg, Back  Bathing assist        Upper Body Dressing/Undressing Upper body dressing   What is the patient wearing?: Hospital gown                Upper body assist Assist Level: Touching or steadying assistance(Pt > 75%)      Lower Body Dressing/Undressing Lower body dressing   What is the patient wearing?: Non-skid slipper socks           Non-skid slipper socks- Performed by helper: Don/doff right sock                  Lower body assist        Toileting Toileting     Toileting steps completed by helper: Adjust clothing prior to toileting, Performs perineal hygiene, Adjust clothing after toileting Toileting Assistive Devices: Grab bar or rail  Toileting assist Assist level: (Total A)   Transfers Chair/bed transfer Chair/bed transfer activity  did not occur: Safety/medical concerns           Locomotion Ambulation Ambulation activity did not occur: Safety/medical Editor, commissioning activity did not occur: Safety/medical concerns Type: Manual      Cognition Comprehension Comprehension assist level: Understands basic 25 - 49% of the time/ requires cueing 50 - 75% of the time  Expression Expression assist level: Expresses basic 25 - 49% of the time/requires cueing 50 - 75% of the time. Uses single words/gestures.  Social Interaction Social Interaction assist level: Interacts appropriately 25  - 49% of time - Needs frequent redirection.  Problem Solving Problem solving assist level: Solves basic 25 - 49% of the time - needs direction more than half the time to initiate, plan or complete simple activities  Memory Memory assist level: Recognizes or recalls 25 - 49% of the time/requires cueing 50 - 75% of the time   Medical Problem List and Plan:  1. Decreased functional mobility secondary to metabolic encephalopathy/debility as well as history of closed left distal fibular fracture-short leg cast and nonweightbearing  -Continue PT, OT, speech therapy 2. DVT Prophylaxis/Anticoagulation: Subcutaneous heparin. Monitor for any bleeding episodes  3. Pain Management: Tylenol as needed  4. Mood: Provide emotional support  5. Neuropsych: This patient is not capable of making decisions on his own behalf. He has experienced notable cognitive decline from when he was previously discharged from rehab. Hopeful to see improvement with improved medical status, normalized sleep-wake.    Maintain sleep chart   -Continue trazodone last night which appears to have been effective 6. Skin/Wound Care: Routine skin checks  7. Fluids/Electrolytes/Nutrition: Routine I&O's with follow-up chemistries with HD  8. End-stage renal disease. Continue hemodialysis as per renal services  9. Hypertension. Monitor with increased mobility  10. Klebsiella UTI. Ancef completed  11. Diabetes mellitus peripheral neuropathy. SSI.   blood sugars before meals and at bedtime    -Fair to borderline control    -ADD carb restrictions to diet 12. Acute on chronic anemia. Continue iron supplement  13. COPD with history of tobacco abuse. Provide counseling    LOS (Days) [place] T, MD 03/01/2017 8:45 AM

## 2017-03-01 NOTE — Progress Notes (Signed)
Subjective: Interval History: has no complaint .  Objective: Vital signs in last 24 hours: Temp:  [97.5 F (36.4 C)-98 F (36.7 C)] 97.5 F (36.4 C) (01/21 0500) Pulse Rate:  [93] 93 (01/21 0500) Resp:  [16-18] 16 (01/21 0500) BP: (146-153)/(73-78) 153/78 (01/21 0500) SpO2:  [97 %-98 %] 97 % (01/21 0500) Weight:  [83.8 kg (184 lb 11.9 oz)] 83.8 kg (184 lb 11.9 oz) (01/21 0500) Weight change: 0.2 kg (7.1 oz)  Intake/Output from previous day: 01/20 0701 - 01/21 0700 In: 396 [P.O.:396] Out: -  Intake/Output this shift: No intake/output data recorded.  General appearance: difficult to arrouse, Ox1 Resp: diminished breath sounds bilaterally and wheezes bilaterally Cardio: S1, S2 normal and systolic murmur: systolic ejection 2/6, decrescendo at 2nd left intercostal space GI: soft, non-tender; bowel sounds normal; no masses,  no organomegaly Extremities: AVF RUA , RIJ PC  Lab Results: No results for input(s): WBC, HGB, HCT, PLT in the last 72 hours. BMET:  Recent Labs    02/27/17 0551  NA 137  K 3.9  CL 97*  CO2 22  GLUCOSE 130*  BUN 54*  CREATININE 10.23*  CALCIUM 9.9   No results for input(s): PTH in the last 72 hours. Iron Studies: No results for input(s): IRON, TIBC, TRANSFERRIN, FERRITIN in the last 72 hours.  Studies/Results: No results found.  HD MWF [facility] 4h 45min 2/2.5  95.5kg   Hep 8600   RIJ cath (rt BCF revised by Dr. Donzetta Matters Sept + PTA by Dr. Scot Dock Nov)    I have reviewed the patient's current medications.  Assessment/Plan: 1 ESRD stable, HD MWF, HD today 2 HTN controlled 3 DM 4 Anemia esa 5 HPTH 6 Enceph ?? Not getting better 7 COPD 8 Resp failure ? Brain damage 9 Ankle fx  P HD, esa, attempts at rehab    LOS: 3 days   Sol Blazing 03/01/2017,11:24 AM

## 2017-03-01 NOTE — IPOC Note (Addendum)
Overall Plan of Care [facility]) Patient Details Name: [name] MRN: [id number] DOB: 12-14-1950  Admitting Diagnosis: Encephalopathy  Hospital Problems: Principal Problem:   Encephalopathy Active Problems:   Essential hypertension   End stage renal disease on dialysis [facility])   Closed fracture of distal end of left fibula   Anemia due to chronic kidney disease     Functional Problem List: Nursing Bladder, Bowel, Nutrition, Safety, Behavior  PT Balance, Behavior, Endurance, Motor, Perception, Safety  OT Balance, Behavior, Perception, Cognition, Safety, Endurance, Motor, Vision  SLP Cognition  TR         Basic ADL's: OT Grooming, Eating, Bathing, Dressing, Toileting     Advanced  ADL's: OT Simple Meal Preparation     Transfers: PT Bed Mobility, Bed to Chair, Car, Furniture, Floor  OT Toilet, Metallurgist: PT Emergency planning/management officer, Ambulation, Stairs     Additional Impairments: OT None  SLP Swallowing, Communication, Social Cognition expression, comprehension Problem Solving, Memory, Attention, Awareness  TR      Anticipated Outcomes Item Anticipated Outcome  Self Feeding Supervision   Swallowing  min A   Basic self-care  Supervision-Min A  Toileting  Min A   Bathroom Transfers Min A  Bowel/Bladder  Pt will be continent of bowel and bladder with supervision  Transfers  min assist   Locomotion  supervision w/c   Communication  min A  Cognition  min A  Pain  Pt pain level will be 3 or less  Safety/Judgment  Min assist with appropriate devices   Therapy Plan: PT Intensity: Minimum of 1-2 x/day ,45 to 90 minutes PT Frequency: 5 out of 7 days PT Duration Estimated Length of Stay: 24-28 days  OT Intensity: Minimum of 1-2 x/day, 45 to 90 minutes OT Duration/Estimated Length of Stay: 3.5-4 weeks SLP Intensity: Minumum of 1-2 x/day, 30 to 90 minutes SLP Frequency: 3 to 5 out of 7 days SLP Duration/Estimated Length of Stay: 4 weeks    Team  Interventions: Nursing Interventions Patient/Family Education, Bowel Management, Bladder Management, Cognitive Remediation/Compensation, Dysphagia/Aspiration Precaution Training  PT interventions Visual/perceptual remediation/compensation, Stair training, Pain management, Disease management/prevention, Ambulation/gait training, Wheelchair propulsion/positioning, Therapeutic Activities, Patient/family education, Engineer, drilling, Training and development officer, Cognitive remediation/compensation, Psychosocial support, Therapeutic Exercise, UE/LE Strength taining/ROM, Skin care/wound management, Functional mobility training, Community reintegration, Discharge planning, Neuromuscular re-education, Splinting/orthotics, UE/LE Coordination activities  OT Interventions Balance/vestibular training, Discharge planning, Pain management, Self Care/advanced ADL retraining, Therapeutic Activities, UE/LE Coordination activities, Therapeutic Exercise, Patient/family education, Functional mobility training, Cognitive remediation/compensation, Community reintegration, Engineer, drilling, Psychosocial support, UE/LE Strength taining/ROM, Disease mangement/prevention, Skin care/wound managment, Visual/perceptual remediation/compensation, Neuromuscular re-education, Splinting/orthotics, Wheelchair propulsion/positioning  SLP Interventions Cognitive remediation/compensation, Cueing hierarchy, Dysphagia/aspiration precaution training, Environmental controls, Functional tasks, Multimodal communication approach, Therapeutic Activities, Therapeutic Exercise  TR Interventions    SW/CM Interventions Discharge Planning, Psychosocial Support, Patient/Family Education   Barriers to Discharge MD  Medical stability  Nursing      PT Behavior, Weight bearing restrictions Impaired cognition  OT Behavior, Medical stability, Hemodialysis, Home environment access/layout impaired cognition  SLP      SW  Behavior, Lack of/limited family support     Team Discharge Planning: Destination: PT-Home ,OT- Home , SLP-Skilled Nursing Facility (SNF) Projected Follow-up: PT-Home health PT, 24 hour supervision/assistance, OT-  24 hour supervision/assistance, Home health OT, SLP-24 hour supervision/assistance, Skilled Nursing facility Projected Equipment Needs: PT-To be determined, OT- To be determined, SLP-To be determined Equipment Details: PT-has most DME from recent previous admission, OT-  Patient/family involved in discharge planning: PT- Patient,  OT-Patient, SLP-Patient unable/family or caregive not available  MD ELOS: 20-27 days Medical Rehab Prognosis:  Good Assessment: The patient has been admitted for CIR therapies with the diagnosis of encephalopathy/debility. The team will be addressing functional mobility, strength, stamina, balance, safety, adaptive techniques and equipment, self-care, bowel and bladder mgt, patient and caregiver education, neuromuscular reeducation, activity tolerance, cognition, swallowing, communication, ego support. Goals have been set at minimal assistance for basic mobility, self-care, ADLs, transfers, cognition and swallowing.    Meredith Staggers, MD, FAAPMR      See Team Conference Notes for weekly updates to the plan of care

## 2017-03-02 ENCOUNTER — Inpatient Hospital Stay (HOSPITAL_COMMUNITY): Payer: Medicare Other | Admitting: Physical Therapy

## 2017-03-02 ENCOUNTER — Inpatient Hospital Stay (HOSPITAL_COMMUNITY): Payer: Medicare Other

## 2017-03-02 ENCOUNTER — Inpatient Hospital Stay (HOSPITAL_COMMUNITY): Payer: Medicare Other | Admitting: Speech Pathology

## 2017-03-02 ENCOUNTER — Inpatient Hospital Stay (HOSPITAL_COMMUNITY): Payer: Medicare Other | Admitting: Occupational Therapy

## 2017-03-02 LAB — GLUCOSE, CAPILLARY
GLUCOSE-CAPILLARY: 133 mg/dL — AB (ref 65–99)
GLUCOSE-CAPILLARY: 157 mg/dL — AB (ref 65–99)
GLUCOSE-CAPILLARY: 146 mg/dL — AB (ref 65–99)
Glucose-Capillary: 155 mg/dL — ABNORMAL HIGH (ref 65–99)

## 2017-03-02 LAB — URINALYSIS, COMPLETE (UACMP) WITH MICROSCOPIC
Bilirubin Urine: NEGATIVE
GLUCOSE, UA: 150 mg/dL — AB
Ketones, ur: NEGATIVE mg/dL
Leukocytes, UA: NEGATIVE
Nitrite: NEGATIVE
PH: 5 (ref 5.0–8.0)
Protein, ur: >=300 mg/dL — AB
SPECIFIC GRAVITY, URINE: 1.019 (ref 1.005–1.030)

## 2017-03-02 LAB — RENAL FUNCTION PANEL
ALBUMIN: 3.5 g/dL (ref 3.5–5.0)
ANION GAP: 25 — AB (ref 5–15)
BUN: 117 mg/dL — ABNORMAL HIGH (ref 6–20)
CALCIUM: 9.9 mg/dL (ref 8.9–10.3)
CO2: 21 mmol/L — ABNORMAL LOW (ref 22–32)
Chloride: 96 mmol/L — ABNORMAL LOW (ref 101–111)
Creatinine, Ser: 18.22 mg/dL — ABNORMAL HIGH (ref 0.61–1.24)
GFR, EST AFRICAN AMERICAN: 3 mL/min — AB (ref >60)
GFR, EST NON AFRICAN AMERICAN: 2 mL/min — AB (ref >60)
Glucose, Bld: 199 mg/dL — ABNORMAL HIGH (ref 65–99)
PHOSPHORUS: 7.2 mg/dL — AB (ref 2.5–4.6)
POTASSIUM: 4.5 mmol/L (ref 3.5–5.1)
SODIUM: 142 mmol/L (ref 135–145)

## 2017-03-02 LAB — CBC
HEMATOCRIT: 39 % (ref 39.0–52.0)
HEMOGLOBIN: 12.8 g/dL — AB (ref 13.0–17.0)
MCH: 33.6 pg (ref 26.0–34.0)
MCHC: 32.8 g/dL (ref 30.0–36.0)
MCV: 102.4 fL — ABNORMAL HIGH (ref 78.0–100.0)
Platelets: 446 10*3/uL — ABNORMAL HIGH (ref 150–400)
RBC: 3.81 MIL/uL — AB (ref 4.22–5.81)
RDW: 15.3 % (ref 11.5–15.5)
WBC: 13.9 10*3/uL — ABNORMAL HIGH (ref 4.0–10.5)

## 2017-03-02 MED ORDER — SODIUM CHLORIDE 0.9 % IV SOLN: 100.0000 mL | INTRAVENOUS | Status: DC | PRN

## 2017-03-02 MED ORDER — LIDOCAINE-PRILOCAINE 2.5-2.5 % EX CREA
1.0000 "application " | TOPICAL_CREAM | CUTANEOUS | Status: DC | PRN
  Filled 2017-03-02: qty 5

## 2017-03-02 MED ORDER — PENTAFLUOROPROP-TETRAFLUOROETH EX AERO: 1.0000 "application " | INHALATION_SPRAY | CUTANEOUS | Status: DC | PRN

## 2017-03-02 MED ORDER — HEPARIN SODIUM (PORCINE) 1000 UNIT/ML DIALYSIS
1000.0000 [IU] | INTRAMUSCULAR | Status: DC | PRN
  Filled 2017-03-02: qty 1

## 2017-03-02 MED ORDER — HEPARIN SODIUM (PORCINE) 1000 UNIT/ML DIALYSIS
100.0000 [IU]/kg | INTRAMUSCULAR | Status: DC | PRN
  Filled 2017-03-02: qty 9

## 2017-03-02 MED ORDER — ALTEPLASE 2 MG IJ SOLR
2.0000 mg | Freq: Once | INTRAMUSCULAR | Status: AC | PRN
  Administered 2017-03-02: 2 mg

## 2017-03-02 MED ORDER — ALTEPLASE 2 MG IJ SOLR
2.0000 mg | Freq: Once | INTRAMUSCULAR | Status: AC
  Administered 2017-03-02: 2 mg

## 2017-03-02 MED ORDER — LIDOCAINE HCL (PF) 1 % IJ SOLN
5.0000 mL | INTRAMUSCULAR | Status: DC | PRN
  Filled 2017-03-02: qty 5

## 2017-03-02 MED ORDER — ALTEPLASE 2 MG IJ SOLR
INTRAMUSCULAR | Status: AC
  Filled 2017-03-02: qty 4

## 2017-03-02 NOTE — Progress Notes (Signed)
[place] PHYSICAL MEDICINE & REHABILITATION     PROGRESS NOTE    Subjective/Complaints: Nurse reports that patient slept most of the night.  He is difficult to arouse this morning.  ROS: Limited due to cognition  Objective: Vital Signs: Blood pressure 128/70, pulse 84, temperature 97.8 F (36.6 C), temperature source Oral, resp. rate 16, height 6\' 1"  (1.854 m), weight 83.5 kg (184 lb 1.4 oz), SpO2 99 %. No results found. No results for input(s): WBC, HGB, HCT, PLT in the last 72 hours. No results for input(s): NA, K, CL, GLUCOSE, BUN, CREATININE, CALCIUM in the last 72 hours.  Invalid input(s): CO CBG (last 3)  Recent Labs    03/01/17 1631 03/01/17 2135 03/02/17 0631  GLUCAP 163* 139* 133*    Wt Readings from Last 3 Encounters:  03/02/17 83.5 kg (184 lb 1.4 oz)  02/26/17 79.8 kg (175 lb 14.8 oz)  02/15/17 84.4 kg (186 lb 1.1 oz)    Physical Exam:  Head: Normocephalic.  Eyes: EOM are normal. Right eye exhibits discharge. Left eye exhibits no discharge.  Neck: Normal range of motion. Neck supple. No thyromegaly present.  Cardiovascular:.RRR without murmur. No JVD      Respiratory: Patient with productive cough when I came in the room.  Chest auscultation was fairly unremarkable although he was not giving good effort however. GI: Soft. Bowel sounds are normal. He exhibits no distension. There is no tenderness.  Skin. Left leg short leg cast in place  Musculoskeletal. He exhibits tenderness left leg Neurological: Confused.  Extremely slow to process.  Speech is unintelligible.  Moves all 4 limbs.  Strength in the upper extremity is grossly 4+ out of 5 proximal distal. Right Lower extremity strength 3+ to 4 out of 5 hip flexors knee extensors and 4-5 at the right ankle. Left leg remains limited by cast in place  Psych: flat, confused, slow to arouse    Assessment/Plan: 1. Functional deficits secondary to debility and encephalopathy which require 3+ hours per day of  interdisciplinary therapy in a comprehensive inpatient rehab setting. Physiatrist is providing close team supervision and 24 hour management of active medical problems listed below. Physiatrist and rehab team continue to assess barriers to discharge/monitor patient progress toward functional and medical goals.  Function:  Bathing Bathing position   Position: Sitting EOB  Bathing parts Body parts bathed by patient: Right arm, Left arm, Chest, Abdomen, Front perineal area Body parts bathed by helper: Buttocks, Right upper leg, Left upper leg, Right lower leg, Left lower leg, Back  Bathing assist        Upper Body Dressing/Undressing Upper body dressing   What is the patient wearing?: Hospital gown                Upper body assist Assist Level: Touching or steadying assistance(Pt > 75%)      Lower Body Dressing/Undressing Lower body dressing   What is the patient wearing?: Non-skid slipper socks           Non-skid slipper socks- Performed by helper: Don/doff right sock                  Lower body assist        Toileting Toileting     Toileting steps completed by helper: Adjust clothing prior to toileting, Performs perineal hygiene, Adjust clothing after toileting Toileting Assistive Devices: Grab bar or rail  Toileting assist Assist level: Two helpers(per [place], NT report)   Transfers Chair/bed transfer  Chair/bed transfer activity did not occur: Safety/medical concerns   Chair/bed transfer assist level: 2 helpers(per [place] Fl[place], NT report) Chair/bed transfer assistive device: Mechanical lift Mechanical lift: Maximove   Locomotion Ambulation Ambulation activity did not occur: Safety/medical Editor, commissioning activity did not occur: Safety/medical concerns Type: Manual      Cognition Comprehension Comprehension assist level: Understands basic 25 - 49% of the time/ requires cueing 50 - 75% of the time  Expression  Expression assist level: Expresses basic 25 - 49% of the time/requires cueing 50 - 75% of the time. Uses single words/gestures.  Social Interaction Social Interaction assist level: Interacts appropriately 25 - 49% of time - Needs frequent redirection.  Problem Solving Problem solving assist level: Solves basic 25 - 49% of the time - needs direction more than half the time to initiate, plan or complete simple activities  Memory Memory assist level: Recognizes or recalls 25 - 49% of the time/requires cueing 50 - 75% of the time   Medical Problem List and Plan:  1. Decreased functional mobility secondary to metabolic encephalopathy/debility as well as history of closed left distal fibular fracture-short leg cast and nonweightbearing  -Continue PT, OT, speech therapy -Team conference today 2. DVT Prophylaxis/Anticoagulation: Subcutaneous heparin. Monitor for any bleeding episodes  3. Pain Management: Tylenol as needed  4. Mood: Provide emotional support  5. Neuropsych: This patient is not capable of making decisions on his own behalf.      -Continue to lethargy and encephalopathy.  He had a MRI in December and recently a head CT which showed generalized atrophy and small vessel disease.  I am not sure what another scan would yield at this point..      -Check urinalysis and culture.  Check chest x-ray as well    -maintain sleep chart   -Discontinue trazodone  6. Skin/Wound Care: Routine skin checks  7. Fluids/Electrolytes/Nutrition: Routine I&O's with follow-up chemistries with HD  8. End-stage renal disease. Continue hemodialysis as per renal services  9. Hypertension. Monitor with increased mobility  10. Klebsiella UTI. Ancef completed  11. Diabetes mellitus peripheral neuropathy. SSI.   blood sugars before meals and at bedtime    -Fair to borderline control    -Added carb restrictions to diet 12. Acute on chronic anemia. Continue iron supplement  13. COPD with history of tobacco abuse.  Provide counseling when appropriate   LOS (Days) Martinez T, MD 03/02/2017 8:52 AM

## 2017-03-02 NOTE — Progress Notes (Signed)
Pt 's HD catheter is not working well and Nephrologist ordered to instill cathflo and pt will have another tx tomorrow. Pt only run for 61mins with stable condition
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
## 2017-03-02 NOTE — Progress Notes (Signed)
Physical Therapy Session Note  Patient Details  Name: [name] MRN: [id number] Date of Birth: 1950-10-01  Today's Date: 03/02/2017 PT Individual Time: 1107-1203 PT Individual Time Calculation (min): 56 min   Short Term Goals: Week 1:  PT Short Term Goal 1 (Week 1): Pt will transfer bed<>chair w/ LRAD Max assist x1  PT Short Term Goal 2 (Week 1): Pt will maintain WB precautions in LLE w/ min cues 50% of time  PT Short Term Goal 3 (Week 1): Pt will initiate gait training  PT Short Term Goal 4 (Week 1): Pt will perform bed mobility w/ Mod assist  PT Short Term Goal 5 (Week 1): Pt will self-propel w/c 25' w/ min assist   Skilled Therapeutic Interventions/Progress Updates:  Pt received in bed with gown pulled off arms. Therapist provided total assist for donning gown. No behaviors demonstrating pain were noted during session. Pt is able to roll in bed but unable to upon request from therapist thus requiring total assist for initiation of movement. Pt unable to maintain weight bearing precautions when rolling in bed. Therapist placed sling total assist and pt transferred bed>w/c via maximove for safety. Pt unable to actively assist with applying hand sanitizer and rubbing hands together despite HOH assist. Transported pt to gym via w/c total assist and attempted to have pt engage in activities focusing on following one step commands (pick up cup, hand cup to therapist, press red light on dynavision) but pt unable to follow one step commands and with flat affect. Pt did not verbally communicate with therapist during session, even when asked his age or name. Attempted to have pt propel w/c but pt unable despite [facility] assist. At end of session pt left in handoff to OT.  Therapy Documentation Precautions:  Precautions Precautions: Fall Required Braces or Orthoses: Other Brace/Splint Other Brace/Splint: Short leg cast Restrictions Weight Bearing Restrictions: Yes LLE Weight Bearing: Non weight  bearing Other Position/Activity Restrictions: Per initial ED note, pt to be NWB and follow up with ortho in office    See Function Navigator for Current Functional Status.   Therapy/Group: Individual Therapy  [name] 03/02/2017, 12:18 PM

## 2017-03-02 NOTE — Progress Notes (Signed)
[facility] Progress Note   Subjective:   OT at bedside. Patient will not respond or acknowledge me. Resting comfortably.   Objective Vitals:   03/01/17 0500 03/01/17 1555 03/02/17 0500 03/02/17 1452  BP: (!) 153/78 120/74 128/70 140/73  Pulse: 93 (!) 103 84 91  Resp: 16 18 16 16   Temp: (!) 97.5 F (36.4 C) 98.6 F (37 C) 97.8 F (36.6 C) 98 F (36.7 C)  TempSrc: Oral Oral Oral Oral  SpO2: 97% 95% 99% 97%  Weight: 83.8 kg (184 lb 11.9 oz)  83.5 kg (184 lb 1.4 oz)   Height:       Physical Exam General:NAD Heart:RRR Lungs:CTAB with bibasilar rales Abdomen:soft, +BS Extremities:no edema Dialysis Access: R IJ TDC - dressed  Filed Weights   02/28/17 0507 03/01/17 0500 03/02/17 0500  Weight: 83.6 kg (184 lb 4.9 oz) 83.8 kg (184 lb 11.9 oz) 83.5 kg (184 lb 1.4 oz)    Intake/Output Summary (Last 24 hours) at 03/02/2017 1520 Last data filed at 03/02/2017 1300 Gross per 24 hour  Intake 365 ml  Output -  Net 365 ml    Additional Objective Labs: Basic Metabolic Panel: Recent Labs  Lab 02/24/17 0226 02/26/17 0735 02/27/17 0551  NA 138 136 137  K 4.1 3.7 3.9  CL 97* 97* 97*  CO2 20* 21* 22  GLUCOSE 112* 117* 130*  BUN 52* 65* 54*  CREATININE 11.13* 12.30* 10.23*  CALCIUM 9.2 9.2 9.9  PHOS 8.3* 8.6* 6.6*   Liver Function Tests: Recent Labs  Lab 02/24/17 0226 02/26/17 0735 02/27/17 0551  AST 14*  --   --   ALT 16*  --   --   ALKPHOS 45  --   --   BILITOT 0.9  --   --   PROT 6.6  --   --   ALBUMIN 3.0* 3.0* 3.6   CBC: Recent Labs  Lab 02/24/17 0226 02/26/17 0736  WBC 12.9* 9.8  HGB 11.3* 10.8*  HCT 34.1* 31.8*  MCV 103.0* 100.3*  PLT 314 315   Blood Culture    Component Value Date/Time   SDES URINE, CATHETERIZED 02/20/2017 2355   SPECREQUEST NONE 02/20/2017 2355   CULT >=100,000 COLONIES/mL KLEBSIELLA PNEUMONIAE (A) 02/20/2017 2355   REPTSTATUS 02/23/2017 FINAL 02/20/2017 2355    CBG: Recent Labs  Lab 03/01/17 1154  03/01/17 1631 03/01/17 2135 03/02/17 0631 03/02/17 1226  GLUCAP 151* 163* 139* 133* 155*    Lab Results  Component Value Date   INR 0.99 02/19/2017   INR 1.7 (H) 09/11/2007   INR 1.5 09/10/2007   Studies/Results: Dg Chest 1 View  Result Date: 03/02/2017 CLINICAL DATA:  Cough EXAM: CHEST 1 VIEW COMPARISON:  02/23/2017 chest radiograph. FINDINGS: Right internal jugular central venous catheter terminates over the right atrium. The lower lungs are obscured by extrinsic material. Stable cardiomediastinal silhouette with normal heart size. No pneumothorax. No pleural effusion. Lungs appear clear, with no acute consolidative airspace disease and no pulmonary edema. IMPRESSION: No active cardiopulmonary disease. Electronically Signed   By: Ilona Sorrel M.D.   On: 03/02/2017 10:16    Medications: . sodium chloride    . sodium chloride    . [START ON 03/03/2017] ferric gluconate (FERRLECIT/NULECIT) IV     . [START ON 03/05/2017] darbepoetin (ARANESP) injection - DIALYSIS  100 mcg Intravenous Q Fri-HD  . docusate  100 mg Per Tube BID  . feeding supplement (NEPRO CARB STEADY)  237 mL Oral BID BM  . heparin  injection (subcutaneous)  5,000 Units Subcutaneous Q8H  . insulin aspart  0-9 Units Subcutaneous TID WC & HS  . multivitamin  1 tablet Oral QHS  . pantoprazole  40 mg Oral QHS  . sevelamer carbonate  1,600 mg Oral TID WC    Dialysis Orders: MWF [facility] 4h 77min 2/2 bath 400/80085kg Hep 8600 R AVF -Calcitriol 2.0 mcg PO TIW -Sensipar 120 mg PO TIW -Venofer 50 mg IV weekly -Mircera 60 mcg IV q 2 weeks (Last dose 02/12/17)    Assessment/Plan: 1. Encephalopathy - no improvement today. UC ordered. 2. ESRD - HD bumped to today due to emergent patient. Continue on MWF schedule. Some difficulty with [facility] last HD, may need it changed out if continues.  3. Anemia of CKD- stable, Hgb 10.8 on IV iron q week (wed) 4. Secondary hyperparathyroidism - labs stable, continue binders. 5.  HTN/volume - BP controlled, titrate down volume as tolerated. 6. Nutrition - alb 3.0, renal diet w/fluid restrictions, soft mechanical diet per speech/lang due to dysphagia. Renavite. 7. COPD - per primary 8. DM - per primary 9. Ankle frx - per primary 10. Deconditioning - currently in inpatient rehab    Jen Mow, [facility] Pager: [phone] 03/02/2017,3:20 PM  LOS: 4 days   Pt seen, examined and agree w A/P as above.  Kelly Splinter MD [employer] pager [phone]   03/02/2017, 4:50 PM

## 2017-03-02 NOTE — Progress Notes (Signed)
Modified Barium Swallow Progress Note  Patient Details  Name: [name] MRN: [id number] Date of Birth: 1950/09/20  Today's Date: 03/02/2017  Modified Barium Swallow completed.  Full report located under Chart Review in the Imaging Section.  Brief recommendations include the following:  Clinical Impression  Patient presents with mild pharyngeal dysphagia that is exacerbated by cognitive impairments. Patient demonstrates a delayed swallow initiation with intermittent premature spillage due to lethargy and poor awareness of bolus leading to sensed penetration to the vocal cords with thin liquids.  Patient also demonstrates delayed swallow initiation with pureed textures, honey-thick liquids and nectar-thick liquids, however, patient able to protect his airway. Mild pharyngeal residuals noted that patient sensed and independently utilized a second swallow to clear with extra time.  Due to cognitive impairments, limited trials were attempted with extra time and Max-Total A needed for self-feeding. Of note, patient with significant coughing throughout the study that did not appear related to swallowing with the exception of the one episode of penetration.  Recommend patient continue with a conservative diet of Dys. 1 textures and upgrade to honey-thick liquids via cup to maximize safety. Potential for upgrade is good as mentation and arousal improves.    Swallow Evaluation Recommendations       SLP Diet Recommendations: Dysphagia 1 (Puree) solids;Honey thick liquids   Liquid Administration via: Cup   Medication Administration: Crushed with puree   Supervision: Patient able to self feed;Full supervision/cueing for compensatory strategies;Staff to assist with self feeding   Compensations: Minimize environmental distractions;Small sips/bites;Slow rate   Postural Changes: Seated upright at 90 degrees   Oral Care Recommendations: Oral care BID   Other Recommendations: Order thickener from  pharmacy;Have oral suction available;Prohibited food (jello, ice cream, thin soups);Remove water pitcher    Gloris Shiroma 03/02/2017,3:30 PM

## 2017-03-02 NOTE — Progress Notes (Addendum)
Occupational Therapy Session Note  Patient Details  Name: [name] MRN: [id number] Date of Birth: 1950-06-30  Today's Date: 03/02/2017 OT Individual Time: 1203-1300 OT Individual Time Calculation (min): 60 min    Short Term Goals: Week 1:  OT Short Term Goal 1 (Week 1): Pt will maintain unsupported sitting balance during ADL session with close supervision  OT Short Term Goal 2 (Week 1): Pt will initiate completion of 1 grooming task  OT Short Term Goal 3 (Week 1): Pt will complete UB dressing with Mod A  Skilled Therapeutic Interventions/Progress Updates:    Pt seen for OT session focusing on following one step commands during grooming tasks and bed mobility. Pt received sitting in w/c with hand off from PT. Pt taken back to room. Grooming tasks from w/c level at sink. Pt able to follow one step commands ~25% of the time. He was unable to initiate washing of face when present with washcloth, however, once washcloth was placed in his hand he was able to bring to face and attended to task ~2-3 seconds.  Therapist noted smell of possible incontinent episode, NT arrived and alerted therapist pt needed to be in/out cath for UA sample. Therefore, used Maximove to transfer pt back to bed. He required max-total A to roll (assist required to maintain NWB through L LE when pt initiated assist in rolloing. for clothing management and hygiene to be completed total A. While nursing staff completed in/out cath, therapist addressed oral care with pt using suction toothbrush. Initially required total A, however, pt then took suction toothbrush and able to complete hygiene to all areas of mouth, maintaining attention ~1 minute while nursing staff completed care. When catheter inserted however, pt required +3 assist to restrain from pulling out catheter. Only verbalization during session was "$h**" multiple times during catheterization.  Pants donned total A in supine and maximove used to transfer pt back to w/c  at end of session. Pt left seated in w/c with RN present and awaiting hand off to SLP.   Therapy Documentation Precautions:  Precautions Precautions: Fall Required Braces or Orthoses: Other Brace/Splint Other Brace/Splint: Short leg cast Restrictions Weight Bearing Restrictions: Yes LLE Weight Bearing: Non weight bearing Other Position/Activity Restrictions: Per initial ED note, pt to be NWB and follow up with ortho in office   ADL: ADL ADL Comments: Please see functional navigator for ADL status  See Function Navigator for Current Functional Status.   Therapy/Group: Individual Therapy  Gracyn Allor L 03/02/2017, 7:19 AM

## 2017-03-02 NOTE — Progress Notes (Signed)
Speech Language Pathology Daily Session Note  Patient Details  Name: [name] MRN: [id number] Date of Birth: 1950/10/08  Today's Date: 03/02/2017 SLP Individual Time: 1306-1350 SLP Individual Time Calculation (min): 44 min  Short Term Goals: Week 1: SLP Short Term Goal 1 (Week 1): Pt will follow 1-step commands within or outside of context given min A visual cues. SLP Short Term Goal 2 (Week 1): Pt will answer basic yes/no questions given supervision question cues. SLP Short Term Goal 3 (Week 1): Pt will consume Dysphagia 1 textures with honey-thick liquids with minimal overt s/s of aspiration across 3 sessions with Max A verbal cues for use of strategies. SLP Short Term Goal 4 (Week 1): Pt will answer orientation questions given max A verbal cues.   Skilled Therapeutic Interventions:  Pt was seen for skilled ST targeting goals for dysphagia and cognition.  SLP facilitated the session with trials of nectar thick liquids to continue working towards diet progression given results of AM MBS.  Pt needed hand over hand assist for self feeding of nectar thick liquids via teaspoon and cup sips.  Pt demonstrated no overt s/s of aspiration with teaspoons of nectar thick liquids; however, given increased rate and decreased portion control with cup sips of nectars pt demonstrated multiple audible swallows and immediate throat clearing.  With trials of honey thick liquids via cup sips, pt demonstrated better rate and portion control with no overt s/s of aspiration.  Would recommend either advancing pt to nectar thick liquids via teaspoon or honey thick liquids via cup sips but will defer to pt's primary SLP.  Pt had very poor attention to tasks and task initiation throughout self feeding as evidenced by pt needing hand over hand assist for feeding as mentioned above.  Pt also appeared to be hallucinating throughout session as he would stare beyond therapist and address people who were not present.  Pt was left  in wheelchair with quick release belt donned and telesitter in use.  Continue per current plan of care.    Function:  Eating Eating   Modified Consistency Diet: Yes Eating Assist Level: Hand over hand assist           Cognition Comprehension Comprehension assist level: Understands basic 25 - 49% of the time/ requires cueing 50 - 75% of the time  Expression   Expression assist level: Expresses basic 25 - 49% of the time/requires cueing 50 - 75% of the time. Uses single words/gestures.  Social Interaction Social Interaction assist level: Interacts appropriately 25 - 49% of time - Needs frequent redirection.  Problem Solving Problem solving assist level: Solves basic 25 - 49% of the time - needs direction more than half the time to initiate, plan or complete simple activities  Memory Memory assist level: Recognizes or recalls 25 - 49% of the time/requires cueing 50 - 75% of the time    Pain Pain Assessment Pain Assessment: No/denies pain Faces Pain Scale: No hurt  Therapy/Group: Individual Therapy  March Joos, Selinda Orion 03/02/2017, 4:01 PM

## 2017-03-03 ENCOUNTER — Inpatient Hospital Stay (HOSPITAL_COMMUNITY): Payer: Medicare Other | Admitting: Speech Pathology

## 2017-03-03 ENCOUNTER — Inpatient Hospital Stay (HOSPITAL_COMMUNITY): Payer: Medicare Other | Admitting: Occupational Therapy

## 2017-03-03 ENCOUNTER — Inpatient Hospital Stay (HOSPITAL_COMMUNITY): Payer: Medicare Other | Admitting: Physical Therapy

## 2017-03-03 ENCOUNTER — Inpatient Hospital Stay (HOSPITAL_COMMUNITY): Payer: Medicare Other

## 2017-03-03 ENCOUNTER — Encounter (HOSPITAL_COMMUNITY): Payer: Self-pay | Admitting: Interventional Radiology

## 2017-03-03 ENCOUNTER — Encounter: Payer: Medicare Other | Admitting: Physical Medicine & Rehabilitation

## 2017-03-03 HISTORY — PX: IR FLUORO GUIDE CV LINE RIGHT: IMG2283

## 2017-03-03 LAB — CBC
HCT: 37.8 % — ABNORMAL LOW (ref 39.0–52.0)
HEMOGLOBIN: 12.5 g/dL — AB (ref 13.0–17.0)
MCH: 33.7 pg (ref 26.0–34.0)
MCHC: 33.1 g/dL (ref 30.0–36.0)
MCV: 101.9 fL — AB (ref 78.0–100.0)
PLATELETS: 410 10*3/uL — AB (ref 150–400)
RBC: 3.71 MIL/uL — AB (ref 4.22–5.81)
RDW: 15.1 % (ref 11.5–15.5)
WBC: 14 10*3/uL — ABNORMAL HIGH (ref 4.0–10.5)

## 2017-03-03 LAB — RENAL FUNCTION PANEL
Albumin: 3.5 g/dL (ref 3.5–5.0)
Anion gap: 21 — ABNORMAL HIGH (ref 5–15)
BUN: 124 mg/dL — ABNORMAL HIGH (ref 6–20)
CHLORIDE: 97 mmol/L — AB (ref 101–111)
CO2: 22 mmol/L (ref 22–32)
Calcium: 9.9 mg/dL (ref 8.9–10.3)
Creatinine, Ser: 17.35 mg/dL — ABNORMAL HIGH (ref 0.61–1.24)
GFR calc non Af Amer: 2 mL/min — ABNORMAL LOW (ref >60)
GFR, EST AFRICAN AMERICAN: 3 mL/min — AB (ref >60)
Glucose, Bld: 183 mg/dL — ABNORMAL HIGH (ref 65–99)
Phosphorus: 7.1 mg/dL — ABNORMAL HIGH (ref 2.5–4.6)
Potassium: 4.3 mmol/L (ref 3.5–5.1)
Sodium: 140 mmol/L (ref 135–145)

## 2017-03-03 LAB — GLUCOSE, CAPILLARY
GLUCOSE-CAPILLARY: 149 mg/dL — AB (ref 65–99)
Glucose-Capillary: 134 mg/dL — ABNORMAL HIGH (ref 65–99)
Glucose-Capillary: 160 mg/dL — ABNORMAL HIGH (ref 65–99)

## 2017-03-03 LAB — URINE CULTURE: Culture: NO GROWTH

## 2017-03-03 MED ORDER — LIDOCAINE HCL (PF) 1 % IJ SOLN
INTRAMUSCULAR | Status: DC | PRN
  Administered 2017-03-03: 10 mL

## 2017-03-03 MED ORDER — LIDOCAINE HCL 1 % IJ SOLN
INTRAMUSCULAR | Status: AC
  Filled 2017-03-03: qty 20

## 2017-03-03 MED ORDER — CHLORHEXIDINE GLUCONATE 4 % EX LIQD
CUTANEOUS | Status: AC
  Filled 2017-03-03: qty 15

## 2017-03-03 MED ORDER — HEPARIN SODIUM (PORCINE) 1000 UNIT/ML IJ SOLN
INTRAMUSCULAR | Status: AC
  Administered 2017-03-03: 3.8 mL
  Filled 2017-03-03: qty 1
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
## 2017-03-03 NOTE — Progress Notes (Signed)
[facility] Progress Note   Subjective:   Not in room, gone to IR  Objective Vitals:   03/02/17 2235 03/02/17 2250 03/03/17 0019 03/03/17 1354  BP:  (!) 153/82 133/73 (!) 145/100  Pulse:  92 83 82  Resp:  17 16 16   Temp:  98 F (36.7 C) 98 F (36.7 C) 98.2 F (36.8 C)  TempSrc:  Oral Oral Oral  SpO2:  98% 98% 98%  Weight: 87 kg (191 lb 12.8 oz)  87.1 kg (192 lb 1.9 oz)   Height:       Physical Exam General: Heart: Lungs: Abdomen: Extremities Dialysis Access:   Filed Weights   03/02/17 1925 03/02/17 2235 03/03/17 0019  Weight: 87.3 kg (192 lb 7.4 oz) 87 kg (191 lb 12.8 oz) 87.1 kg (192 lb 1.9 oz)    Intake/Output Summary (Last 24 hours) at 03/03/2017 1638 Last data filed at 03/03/2017 1300 Gross per 24 hour  Intake 280 ml  Output 152 ml  Net 128 ml    Additional Objective Labs: Basic Metabolic Panel: Recent Labs  Lab 02/26/17 0735 02/27/17 0551 03/02/17 2044  NA 136 137 142  K 3.7 3.9 4.5  CL 97* 97* 96*  CO2 21* 22 21*  GLUCOSE 117* 130* 199*  BUN 65* 54* 117*  CREATININE 12.30* 10.23* 18.22*  CALCIUM 9.2 9.9 9.9  PHOS 8.6* 6.6* 7.2*   Liver Function Tests: Recent Labs  Lab 02/26/17 0735 02/27/17 0551 03/02/17 2044  ALBUMIN 3.0* 3.6 3.5   CBC: Recent Labs  Lab 02/26/17 0736 03/02/17 2043  WBC 9.8 13.9*  HGB 10.8* 12.8*  HCT 31.8* 39.0  MCV 100.3* 102.4*  PLT 315 446*   Blood Culture    Component Value Date/Time   SDES URINE, CLEAN CATCH 03/02/2017 1300   SPECREQUEST NONE 03/02/2017 1300   CULT NO GROWTH 03/02/2017 1300   REPTSTATUS 03/03/2017 FINAL 03/02/2017 1300    CBG: Recent Labs  Lab 03/02/17 1226 03/02/17 1637 03/02/17 2259 03/03/17 0627 03/03/17 1150  GLUCAP 155* 157* 146* 134* 160*    Lab Results  Component Value Date   INR 0.99 02/19/2017   INR 1.7 (H) 09/11/2007   INR 1.5 09/10/2007    Medications: . sodium chloride    . sodium chloride    . sodium chloride    . sodium chloride    .  ferric gluconate (FERRLECIT/NULECIT) IV     . chlorhexidine      . [START ON 03/05/2017] darbepoetin (ARANESP) injection - DIALYSIS  100 mcg Intravenous Q Fri-HD  . docusate  100 mg Per Tube BID  . feeding supplement (NEPRO CARB STEADY)  237 mL Oral BID BM  . heparin injection (subcutaneous)  5,000 Units Subcutaneous Q8H  . insulin aspart  0-9 Units Subcutaneous TID WC & HS  . lidocaine      . multivitamin  1 tablet Oral QHS  . pantoprazole  40 mg Oral QHS  . sevelamer carbonate  1,600 mg Oral TID WC    Dialysis Orders: MWF [facility] 4h 28min 2/2 bath 400/80085kg Hep 8600 R AVF -Calcitriol 2.0 mcg PO TIW -Sensipar 120 mg PO TIW -Venofer 50 mg IV weekly -Mircera 60 mcg IV q 2 weeks (Last dose 02/12/17)    Assessment/Plan: 1. Encephalopathy - no improvement today. ^^BUN/ Cr due to missed HD. Pt likely uremic. HD cath occluded, IR to exchange. Plan HD tonight and tomorrow again.  2. ESRD - HD MWF usual sched  3. Anemia of CKD- stable,  Hgb 10.8 on IV iron q week (wed) 4. Secondary hyperparathyroidism - labs stable, continue binders. 5. HTN/volume - BP controlled, min vol on exam 6. COPD - per primary 7. DM - per primary 8. Ankle frx - per primary 9. Deconditioning - currently in inpatient rehab   Kelly Splinter MD [place] pager [phone]   03/03/2017, 4:38 PM

## 2017-03-03 NOTE — Progress Notes (Signed)
Physical Therapy Session Note  Patient Details  Name: [name] MRN: [id number] Date of Birth: [birth year]  Today's Date: 03/03/2017 PT Individual Time: 2130-8657 PT Individual Time Calculation (min): 29 min   Short Term Goals: Week 1:  PT Short Term Goal 1 (Week 1): Pt will transfer bed<>chair w/ LRAD Max assist x1  PT Short Term Goal 2 (Week 1): Pt will maintain WB precautions in LLE w/ min cues 50% of time  PT Short Term Goal 3 (Week 1): Pt will initiate gait training  PT Short Term Goal 4 (Week 1): Pt will perform bed mobility w/ Mod assist  PT Short Term Goal 5 (Week 1): Pt will self-propel w/c 25' w/ min assist   Skilled Therapeutic Interventions/Progress Updates:  Pt received in bed with RN & phlebotomist present attempting to draw blood. Therapist returned a few minutes later & pt agreeable to tx. No behaviors demonstrating pain noted. Pt appeared more alert during this session and was able to verbally communicate with therapist ("yes, no") but did not provide consistent answers. Pt transferred supine>sitting EOB with total assist as pt very rigid and did not initiate or participate in movement. Pt consumed lunch with max cuing for small bites and sips with pt resistant to instruction. Pt with decreased awareness as he consumed honey thick liquids with spoon instead of drinking from cup. Pt able to tolerate sitting EOB for 15 minutes with supervision for sitting balance. At end of session pt left in care of NT.   Therapy Documentation Precautions:  Precautions Precautions: Fall Required Braces or Orthoses: Other Brace/Splint Other Brace/Splint: Short leg cast Restrictions Weight Bearing Restrictions: Yes LLE Weight Bearing: Non weight bearing Other Position/Activity Restrictions: Per initial ED note, pt to be NWB and follow up with ortho in office    See Function Navigator for Current Functional Status.   Therapy/Group: Individual Therapy  [name] 03/03/2017,  1:48 PM

## 2017-03-03 NOTE — Care Management (Signed)
[facility] Individual Statement of Services  Patient Name:  [name]  Date:  03/02/2017  Welcome to the [facility].  Our goal is to provide you with an individualized program based on your diagnosis and situation, designed to meet your specific needs.  With this comprehensive rehabilitation program, you will be expected to participate in at least 3 hours of rehabilitation therapies Monday-Friday, with modified therapy programming on the weekends.  Your rehabilitation program will include the following services:  Physical Therapy (PT), Occupational Therapy (OT), Speech Therapy (ST), 24 hour per day rehabilitation nursing, Therapeutic Recreaction (TR), Neuropsychology, Case Management (Social Worker), Rehabilitation Medicine, Nutrition Services and Pharmacy Services  Weekly team conferences will be held on Tuesdays to discuss your progress.  Your Social Worker will talk with you frequently to get your input and to update you on team discussions.  Team conferences with you and your family in attendance may also be held.  Expected length of stay: 3 weeks    Overall anticipated outcome: min/ moderate assistance  Depending on your progress and recovery, your program may change. Your Social Worker will coordinate services and will keep you informed of any changes. Your Social Worker's name and contact numbers are listed  below.  The following services may also be recommended but are not provided by the [facility] will be made to provide these services after discharge if needed.  Arrangements include referral to agencies that provide these services.  Your insurance has been verified to be:  Medicare Your primary doctor is:  [facility]  Pertinent information will be shared with your doctor and your insurance  company.  Social Worker:  [place], [place] or ([phone]   Information discussed with and copy given to patient by: Lennart Pall, 03/02/2017, 8:11 AM

## 2017-03-03 NOTE — Patient Care Conference (Signed)
Inpatient RehabilitationTeam Conference and Plan of Care Update Date: 03/02/2017   Time: 2:40 PM    Patient Name: [name]      Medical Record Number: [id number]  Date of Birth: [birth year] Sex: Male         Room/Bed: 4W08C/4W08C-01 Payor Info: Payor: [employer] MEDICARE / Plan: UHC MEDICARE / Product Type: *No Product type* /    Admitting Diagnosis: Debility, ESRD  Admit Date/Time:  02/26/2017  3:30 PM Admission Comments: No comment available   Primary Diagnosis:  Encephalopathy Principal Problem: Encephalopathy  Patient Active Problem List   Diagnosis Date Noted  . Secondary hypertension   . ESRD (end stage renal disease) ([place])   . Diabetes mellitus type 2 in nonobese (H[facility])   . Encephalopathy   . Tobacco abuse   . Acute lower UTI   . Respiratory failure ([place]) 02/19/2017  . Acute respiratory failure with hypoxemia ([place]) 02/19/2017  . ESRD on dialysis ([place])   . Anemia of chronic disease   . Diabetes mellitus type 2 in obese ([place])   . Metabolic encephalopathy 78/29/5621  . Leukocytosis   . Hyperlipidemia   . Benign essential HTN   . Chronic obstructive pulmonary disease ([place])   . Closed fracture of right distal fibula   . Abnormal swallowing   . Altered mental status   . SIRS (systemic inflammatory response syndrome) ([place]) 01/21/2017  . Hypertensive urgency 01/21/2017  . Closed fracture of distal end of left fibula 01/21/2017  . Anemia due to chronic kidney disease 01/21/2017  . Acute metabolic encephalopathy 30/86/5784  . Hyperkalemia 01/20/2017  . Acute respiratory distress   . End stage renal disease on dialysis ([place])   . Influenza 02/28/2016  . Acute respiratory failure with hypoxia ([place]) 03/14/2015  . Acute renal failure superimposed on stage 4 chronic kidney disease ([place]) 03/13/2015  . Acute renal failure (ARF) ([place]) 03/09/2015  . COPD exacerbation ([place])   . Diabetes mellitus ([place]) 03/08/2015  . Anemia 03/08/2015  . Acute on chronic renal failure ([place])  03/08/2015  . Urinary retention 03/08/2015  . Kidney lesion, native, left 03/08/2015  . Pericardial effusion 03/08/2015  . COPD IV / still smoking  03/08/2015  . Cigarette smoker 03/08/2015  . CHRONIC KIDNEY DISEASE STAGE III (MODERATE) 11/06/2009  . ELEVATED PROSTATE SPECIFIC ANTIGEN 11/04/2009  . HYPERLIPIDEMIA 10/10/2009  . ERECTILE DYSFUNCTION 10/10/2009  . Essential hypertension 10/10/2009  . PREDIABETES 10/10/2009  . COLONIC POLYPS, HX OF 10/10/2009    Expected Discharge Date: Expected Discharge Date: (3 weeks vs SNF)  Team Members Present: Physician leading conference: Dr. Alger Simons Social Worker Present: Lennart Pall, LCSW Nurse Present: Rayetta Humphrey, RN PT Present: Lavone Nian, PT OT Present: Willeen Cass, OT SLP Present: Weston Anna, SLP PPS Coordinator present : Daiva Nakayama, RN, CRRN     Current Status/Progress Goal Weekly Team Focus  Medical   admitted with constipation, encephalopathy after missing hd . ongoing cognitive deficits, sleep a bit better  improve cognition  sleep/walke, renal issues, pain   Bowel/Bladder   incont of bowel; anuric LBM 1/21  toilet prn for BM  offer assistance to bathroom for toileting with two staff as needed   Swallow/Nutrition/ Hydration   Dys. 1 textures with honey-thick liquids via cup  Max A  tolerance of current diet, trials of upgraded textures    ADL's   Set up grooming, UB min A , LB total A, total A for toileting needs, bedpan or +2 slideboard to [facility] for toileting  needs  S for grooming and sitting balance, min A overall all other goals  cognition, self care, balance, functional transfers, maintaining NWB L LE, pt/family education, endurance   Mobility   mod/max assist 2/2 cognitive deficits & lethargy  supervision<>min assist overall, short distance w/c mobility & gait  transfers, bed mobility, cognitive remediation, w/c mobility   Communication   Max-Total A  Min A  expression of wants/needs, speech  intelligibility    Safety/Cognition/ Behavioral Observations  Total A  Min A  arousal, attention, orientation, problem solving,    Pain   denies  <2  assess pain q shift and prn and medicate prn   Skin   LLE cast; ecchymosis abd  skin free of infection and breakdown  assess skin q shift and prn      *See Care Plan and progress notes for long and short-term goals.     Barriers to Discharge  Current Status/Progress Possible Resolutions Date Resolved   Physician    Medical stability               Nursing                  PT  Behavior;Weight bearing restrictions  Impaired cognition              OT Behavior;Medical stability;Hemodialysis;Home environment access/layout  impaired cognition             SLP                SW Behavior;Lack of/limited family support              Discharge Planning/Teaching Needs:  Plan is for pt to, again, return home with daughter Beckie Busing) to stay and provide 24/7 assistance.    Teaching with daughter to be arranged closer to d/c.   Team Discussion:  Returning pt to CIR.  MD adjusting meds;  CXR done and - for any concerns.  Pt cannot follow commands.  Staff using maxi move;  Total assist with ADLs.  MBS today but pt very lethargic; coughing = D1, honey via cup.  Unintelligible speech.  Min assist goals IF his cognition and participation improves.  Team notes likely will downgrade goals.  Revisions to Treatment Plan:  None    Continued Need for Acute Rehabilitation Level of Care: The patient requires daily medical management by a physician with specialized training in physical medicine and rehabilitation for the following conditions: Daily direction of a multidisciplinary physical rehabilitation program to ensure safe treatment while eliciting the highest outcome that is of practical value to the patient.: Yes Daily medical management of patient stability for increased activity during participation in an intensive rehabilitation regime.: Yes Daily  analysis of laboratory values and/or radiology reports with any subsequent need for medication adjustment of medical intervention for : Renal problems;Neurological problems  Allisen Pidgeon 03/03/2017, 8:14 AM

## 2017-03-03 NOTE — Procedures (Signed)
Interventional Radiology Procedure Note  Procedure: Successful exchange for a new right IJ tunneled HD catheter (23 cm Palindrome).  Tips in the RA and ready for use.   Complications: None  Estimated Blood Loss: None  Recommendations: - Routine line care  Signed,  Criselda Peaches, MD

## 2017-03-03 NOTE — Plan of Care (Signed)
Pt's plan of care adjusted to 15 hours over 7 days after speaking with care team and discussed with MD in team conference as pt currently unable to tolerate current therapy schedule with OT, PT, and SLP.  

## 2017-03-03 NOTE — Progress Notes (Signed)
Speech Language Pathology Daily Session Note  Patient Details  Name: [name] MRN: [id number] Date of Birth: [birth year]  Today's Date: 03/03/2017 SLP Individual Time: 1000-1055 SLP Individual Time Calculation (min): 55 min  Short Term Goals: Week 1: SLP Short Term Goal 1 (Week 1): Pt will follow 1-step commands within or outside of context given min A visual cues. SLP Short Term Goal 2 (Week 1): Pt will answer basic yes/no questions given supervision question cues. SLP Short Term Goal 3 (Week 1): Pt will consume Dysphagia 1 textures with honey-thick liquids with minimal overt s/s of aspiration across 3 sessions with Max A verbal cues for use of strategies. SLP Short Term Goal 4 (Week 1): Pt will answer orientation questions given max A verbal cues.   Skilled Therapeutic Interventions: Skilled treatment session focused on dysphagia and cognitive goals. SLP facilitated session by providing hand over hand assist for patient to initiate sips of honey-thick liquids via cup. Patient consumed minimal amounts without overt s/s of aspiration. Patient was extremely lethargic throughout the session and required Max A multimodal cues for arousal up to ~30 seconds and total A to complete any basic task. Patient essentially nonverbal throughout session and grunted intermittently in response to clinician's questions. Patient transferred back to bed at end of session via the [facility]. Patient left with NT. Continue with current plan of care.      Function:  Eating Eating   Modified Consistency Diet: Yes Eating Assist Level: Hand over hand assist;Supervision or verbal cues;More than reasonable amount of time   Eating Set Up Assist For: Opening containers       Cognition Comprehension Comprehension assist level: Understands basic 25 - 49% of the time/ requires cueing 50 - 75% of the time  Expression   Expression assist level: Expresses basic 25 - 49% of the time/requires cueing 50 - 75% of the time.  Uses single words/gestures.  Social Interaction Social Interaction assist level: Interacts appropriately 25 - 49% of time - Needs frequent redirection.  Problem Solving Problem solving assist level: Solves basic 25 - 49% of the time - needs direction more than half the time to initiate, plan or complete simple activities  Memory Memory assist level: Recognizes or recalls 25 - 49% of the time/requires cueing 50 - 75% of the time    Pain Pain Assessment Pain Assessment: No/denies pain Faces Pain Scale: No hurt  Therapy/Group: Individual Therapy  [name] 03/03/2017, 4:20 PM

## 2017-03-03 NOTE — Progress Notes (Signed)
Occupational Therapy Note  Patient Details  Name: AVEN CHRISTEN MRN: [id number] Date of Birth: 06-Dec-1950  Today's Date: 03/03/2017 OT Individual Time: 0601-5615 OT Individual Time Calculation (min): 14 min  and Today's Date: 03/03/2017 OT Missed Time: 76 Minutes Missed Time Reason: Patient fatigue  Upon entering the room, pt supine in bed sleeping soundly. Therapist attempting to alert pt but he is unable to open eyes or speak to therapist when question asked. Pt continues to sleep and unable to follow any commands secondary to lethargy. OT notified RN. 45 missed minutes of OT intervention.    Darleen Crocker P 03/03/2017, 2:26 PM

## 2017-03-03 NOTE — Progress Notes (Signed)
Patient's daughter consented for [facility] exchange.  Will plan for today.  Henreitta Cea 03/03/2017

## 2017-03-03 NOTE — Progress Notes (Addendum)
Physical Therapy Session Note  Patient Details  Name: [name] MRN: [id number] Date of Birth: 05-29-1950  Today's Date: 03/03/2017 PT Individual Time: 0936-1000 PT Individual Time Calculation (min): 24 min   Short Term Goals: Week 1:  PT Short Term Goal 1 (Week 1): Pt will transfer bed<>chair w/ LRAD Max assist x1  PT Short Term Goal 2 (Week 1): Pt will maintain WB precautions in LLE w/ min cues 50% of time  PT Short Term Goal 3 (Week 1): Pt will initiate gait training  PT Short Term Goal 4 (Week 1): Pt will perform bed mobility w/ Mod assist  PT Short Term Goal 5 (Week 1): Pt will self-propel w/c 25' w/ min assist   Skilled Therapeutic Interventions/Progress Updates:  Pt received in bed with RN present & administering meds. Pt unable to follow one step commands to roll L<>R in bed & required total assist. Sling placed via total assist and pt transferred bed>w/c via maximove for safety. Pt unable to follow one step commands to lift foot to allow therapist to don sock via total assist. Pt without any verbal communication during session aside from responding "good" when asked how he was feeling today. At end of session pt left in handoff to SLP.  Therapy Documentation Precautions:  Precautions Precautions: Fall Required Braces or Orthoses: Other Brace/Splint Other Brace/Splint: Short leg cast Restrictions Weight Bearing Restrictions: Yes LLE Weight Bearing: Non weight bearing Other Position/Activity Restrictions: Per initial ED note, pt to be NWB and follow up with ortho in office   Pain: No behaviors demonstrating pain.  See Function Navigator for Current Functional Status.   Therapy/Group: Individual Therapy  [name] 03/03/2017, 10:02 AM

## 2017-03-03 NOTE — Progress Notes (Signed)
Social Work Patient ID: [name], male   DOB: 24-Apr-1950, 67 y.o.   MRN: [id number]   Attempted to review team conference with pt, however, he was significantly confused and unable to understand information.  Spoke with daughter via phone who is aware team anticipates 2-3 week stay, however, we are very concerned about pt's current level of impairments both physically and cognitively.  She is also concerned and notes that she is hopeful he could possible reach a min assist goals.  Will monitor progress and plan accordingly with daughter.  Will encourage her to identify additional supports for home discharge.  Continue to follow.  Javid Kemler, LCSW

## 2017-03-04 ENCOUNTER — Inpatient Hospital Stay (HOSPITAL_COMMUNITY): Payer: Medicare Other | Admitting: Physical Therapy

## 2017-03-04 ENCOUNTER — Inpatient Hospital Stay (HOSPITAL_COMMUNITY): Payer: Medicare Other | Admitting: Speech Pathology

## 2017-03-04 ENCOUNTER — Inpatient Hospital Stay (HOSPITAL_COMMUNITY): Payer: Medicare Other | Admitting: Occupational Therapy

## 2017-03-04 ENCOUNTER — Encounter (HOSPITAL_COMMUNITY): Payer: Self-pay

## 2017-03-04 LAB — RENAL FUNCTION PANEL
Albumin: 3.7 g/dL (ref 3.5–5.0)
Anion gap: 21 — ABNORMAL HIGH (ref 5–15)
BUN: 71 mg/dL — AB (ref 6–20)
CALCIUM: 9.9 mg/dL (ref 8.9–10.3)
CO2: 21 mmol/L — ABNORMAL LOW (ref 22–32)
CREATININE: 13.16 mg/dL — AB (ref 0.61–1.24)
Chloride: 97 mmol/L — ABNORMAL LOW (ref 101–111)
GFR calc Af Amer: 4 mL/min — ABNORMAL LOW (ref >60)
GFR, EST NON AFRICAN AMERICAN: 3 mL/min — AB (ref >60)
Glucose, Bld: 136 mg/dL — ABNORMAL HIGH (ref 65–99)
Phosphorus: 7.3 mg/dL — ABNORMAL HIGH (ref 2.5–4.6)
Potassium: 4.6 mmol/L (ref 3.5–5.1)
SODIUM: 139 mmol/L (ref 135–145)

## 2017-03-04 LAB — CBC
HCT: 39.2 % (ref 39.0–52.0)
Hemoglobin: 12.8 g/dL — ABNORMAL LOW (ref 13.0–17.0)
MCH: 33.5 pg (ref 26.0–34.0)
MCHC: 32.7 g/dL (ref 30.0–36.0)
MCV: 102.6 fL — ABNORMAL HIGH (ref 78.0–100.0)
PLATELETS: 322 10*3/uL (ref 150–400)
RBC: 3.82 MIL/uL — ABNORMAL LOW (ref 4.22–5.81)
RDW: 15.2 % (ref 11.5–15.5)
WBC: 16.4 10*3/uL — AB (ref 4.0–10.5)

## 2017-03-04 LAB — GLUCOSE, CAPILLARY
GLUCOSE-CAPILLARY: 146 mg/dL — AB (ref 65–99)
GLUCOSE-CAPILLARY: 149 mg/dL — AB (ref 65–99)

## 2017-03-04 MED ORDER — LIDOCAINE HCL (PF) 1 % IJ SOLN
5.0000 mL | INTRAMUSCULAR | Status: DC | PRN
  Filled 2017-03-04: qty 5

## 2017-03-04 MED ORDER — SODIUM CHLORIDE 0.9 % IV SOLN
62.5000 mg | Freq: Once | INTRAVENOUS | Status: AC
  Administered 2017-03-04: 62.5 mg via INTRAVENOUS

## 2017-03-04 MED ORDER — SODIUM CHLORIDE 0.9 % IV SOLN: 125.0000 mg | Freq: Once | INTRAVENOUS | Status: DC

## 2017-03-04 MED ORDER — LORAZEPAM 2 MG/ML IJ SOLN
INTRAMUSCULAR | Status: AC
  Administered 2017-03-04: 1 mg via INTRAVENOUS
  Filled 2017-03-04: qty 1

## 2017-03-04 MED ORDER — FLUCONAZOLE 100 MG PO TABS
100.0000 mg | ORAL_TABLET | Freq: Every day | ORAL | Status: DC
  Administered 2017-03-04 – 2017-03-05 (×2): 100 mg via ORAL
  Filled 2017-03-04 (×2): qty 1

## 2017-03-04 MED ORDER — HEPARIN SODIUM (PORCINE) 1000 UNIT/ML DIALYSIS
1000.0000 [IU] | INTRAMUSCULAR | Status: DC | PRN
  Filled 2017-03-04: qty 1

## 2017-03-04 MED ORDER — SODIUM CHLORIDE 0.9 % IV SOLN: 100.0000 mL | INTRAVENOUS | Status: DC | PRN

## 2017-03-04 MED ORDER — LIDOCAINE-PRILOCAINE 2.5-2.5 % EX CREA
1.0000 "application " | TOPICAL_CREAM | CUTANEOUS | Status: DC | PRN
  Filled 2017-03-04: qty 5

## 2017-03-04 MED ORDER — ALTEPLASE 2 MG IJ SOLR: 2.0000 mg | Freq: Once | INTRAMUSCULAR | Status: DC | PRN

## 2017-03-04 MED ORDER — PENTAFLUOROPROP-TETRAFLUOROETH EX AERO: 1.0000 "application " | INHALATION_SPRAY | CUTANEOUS | Status: DC | PRN

## 2017-03-04 MED ORDER — HEPARIN SODIUM (PORCINE) 1000 UNIT/ML DIALYSIS
4000.0000 [IU] | INTRAMUSCULAR | Status: DC | PRN
Start: 2017-03-05 — End: 2017-03-05
  Administered 2017-03-04: 4000 [IU] via INTRAVENOUS_CENTRAL
  Filled 2017-03-04 (×2): qty 4

## 2017-03-04 MED ORDER — LORAZEPAM 2 MG/ML IJ SOLN
1.0000 mg | Freq: Once | INTRAMUSCULAR | Status: AC
  Administered 2017-03-04: 1 mg via INTRAVENOUS

## 2017-03-04 NOTE — Progress Notes (Signed)
Physical Therapy Session Note  Patient Details  Name: [name] MRN: [id number] Date of Birth: [birth year]  Today's Date: 03/04/2017 PT Individual Time: 0935-1000 PT Individual Time Calculation (min): 25 min   Short Term Goals: Week 1:  PT Short Term Goal 1 (Week 1): Pt will transfer bed<>chair w/ LRAD Max assist x1 PT Short Term Goal 1 - Progress (Week 1): Not met PT Short Term Goal 2 (Week 1): Pt will maintain WB precautions in LLE w/ min cues 50% of time  PT Short Term Goal 2 - Progress (Week 1): Not met PT Short Term Goal 3 (Week 1): Pt will initiate gait training  PT Short Term Goal 3 - Progress (Week 1): Not met PT Short Term Goal 4 (Week 1): Pt will perform bed mobility w/ Mod assist PT Short Term Goal 4 - Progress (Week 1): Not met PT Short Term Goal 5 (Week 1): Pt will self-propel w/c 25' w/ min assist PT Short Term Goal 5 - Progress (Week 1): Not met  Skilled Therapeutic Interventions/Progress Updates:  Pt received in bed. No behaviors demonstrating pain were noted. Session focused on attention to task, following one step commands, bed mobility and sitting balance.  Pt required significantly extra time and encouragement, as well as max assist to initiate and complete movement for supine>sitting EOB. Pt initially resisting movement but then reluctantly transferred to sitting EOB. Pt able to tolerate sitting EOB ~5 minutes, frequently leaning back on elbows and requiring max assist to return to upright position. Attempted to have pt perform LE long arc quads initially assisting pt with exercise then pt unable to perform it independently. Pt required total assist to return to supine and dependent assist to scoot to [facility] with bed in trendelenburg position. Pt with cough throughout session. Pt continues to not verbally communicate with therapist. At end of session pt left in bed with alarm set & all needs within reach.  Therapy Documentation Precautions:  Precautions Precautions:  Fall Required Braces or Orthoses: Other Brace/Splint Other Brace/Splint: Short leg cast Restrictions Weight Bearing Restrictions: Yes LLE Weight Bearing: Non weight bearing Other Position/Activity Restrictions: Per initial ED note, pt to be NWB and follow up with ortho in office    See Function Navigator for Current Functional Status.   Therapy/Group: Individual Therapy  [name] 03/04/2017, 10:01 AM

## 2017-03-04 NOTE — Significant Event (Signed)
PT. HAS BEEN REFUSING HIS MEDICINES FOR LUNCH AND BREAKFAST,HE BECAME COMBATIVE WHEN TRYING TO FEED HIM OR GIVING HIS [place] WAS NOT GIVEN FOR LUNCH AND DINNER,INSULIN WAS NOT GIVEN FOR DINNER.

## 2017-03-04 NOTE — Progress Notes (Signed)
[facility] Progress Note   Subjective:   Had HD last night , 1L off w BP drop, remains lethargic today  Objective Vitals:   03/03/17 2030 03/03/17 2118 03/03/17 2207 03/04/17 0139  BP: 100/66 (!) 143/79 97/77 124/75  Pulse: 98 (!) 114 (!) 121 (!) 106  Resp:  14 18 16   Temp:  97.8 F (36.6 C) 97.9 F (36.6 C) 98.9 F (37.2 C)  TempSrc:  Oral Oral Oral  SpO2:  98% 97% 98%  Weight:    84.1 kg (185 lb 7.3 oz)  Height:       Physical Exam General: nad Heart: RRR Lungs: coarse BS bilat, no ^wob Abdomen: soft ntnd no ascites Extremities: no edema Dialysis Access: R IJ TDC, dressed Neuro: lethargic, responds minimal verbal  , awakens, confused  Filed Weights   03/03/17 0019 03/03/17 1729 03/04/17 0139  Weight: 87.1 kg (192 lb 1.9 oz) 84 kg (185 lb 3 oz) 84.1 kg (185 lb 7.3 oz)    Intake/Output Summary (Last 24 hours) at 03/04/2017 1144 Last data filed at 03/04/2017 0900 Gross per 24 hour  Intake 240 ml  Output 1032 ml  Net -792 ml    Additional Objective Labs: Basic Metabolic Panel: Recent Labs  Lab 02/27/17 0551 03/02/17 2044 03/03/17 1745  NA 137 142 140  K 3.9 4.5 4.3  CL 97* 96* 97*  CO2 22 21* 22  GLUCOSE 130* 199* 183*  BUN 54* 117* 124*  CREATININE 10.23* 18.22* 17.35*  CALCIUM 9.9 9.9 9.9  PHOS 6.6* 7.2* 7.1*   Liver Function Tests: Recent Labs  Lab 02/27/17 0551 03/02/17 2044 03/03/17 1745  ALBUMIN 3.6 3.5 3.5   CBC: Recent Labs  Lab 02/26/17 0736 03/02/17 2043 03/03/17 1745  WBC 9.8 13.9* 14.0*  HGB 10.8* 12.8* 12.5*  HCT 31.8* 39.0 37.8*  MCV 100.3* 102.4* 101.9*  PLT 315 446* 410*   Blood Culture    Component Value Date/Time   SDES URINE, CLEAN CATCH 03/02/2017 1300   SPECREQUEST NONE 03/02/2017 1300   CULT NO GROWTH 03/02/2017 1300   REPTSTATUS 03/03/2017 FINAL 03/02/2017 1300    CBG: Recent Labs  Lab 03/02/17 2259 03/03/17 0627 03/03/17 1150 03/03/17 2211 03/04/17 0628  GLUCAP 146* 134* 160* 149*  146*    Lab Results  Component Value Date   INR 0.99 02/19/2017   INR 1.7 (H) 09/11/2007   INR 1.5 09/10/2007    Medications: . sodium chloride    . sodium chloride    . sodium chloride    . sodium chloride    . ferric gluconate (FERRLECIT/NULECIT) IV     . [START ON 03/05/2017] darbepoetin (ARANESP) injection - DIALYSIS  100 mcg Intravenous Q Fri-HD  . docusate  100 mg Per Tube BID  . feeding supplement (NEPRO CARB STEADY)  237 mL Oral BID BM  . fluconazole  100 mg Oral Daily  . heparin injection (subcutaneous)  5,000 Units Subcutaneous Q8H  . insulin aspart  0-9 Units Subcutaneous TID WC & HS  . multivitamin  1 tablet Oral QHS  . pantoprazole  40 mg Oral QHS  . sevelamer carbonate  1,600 mg Oral TID WC    Dialysis Orders: MWF [facility] 4h 30min 2/2 bath 400/80085kg Hep 8600 R AVF -Calcitriol 2.0 mcg PO TIW -Sensipar 120 mg PO TIW -Venofer 50 mg IV weekly -Mircera 60 mcg IV q 2 weeks (Last dose 02/12/17)    Assessment: 1. Encephalopathy - uremic +/- other. HD last night after cath  exchange, needs HD again today and again tomorrow for 3 sessions this week.  2. ESRD - HD MWF usual sched  3. Anemia of CKD- stable, Hgb 10.8 on IV iron q week (wed) 4. Secondary hyperparathyroidism - labs stable, continue binders. 5. HTN/volume - BP controlled, no ^vol on exam, under dry slightly 6. COPD - per primary 7. DM - per primary 8. Ankle frx - per primary 9. Deconditioning - currently in inpatient rehab  P - HD today, tomorrow, rehab  Kelly Splinter MD [employer] pager [phone]   03/04/2017, 11:44 AM
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
## 2017-03-04 NOTE — Progress Notes (Signed)
Occupational Therapy Session Note  Patient Details  Name: [name] MRN: [id number] Date of Birth: 04/12/1950  Today's Date: 03/04/2017 OT Individual Time: 1300-1325 OT Individual Time Calculation (min): 25 min    Short Term Goals: Week 1:  OT Short Term Goal 1 (Week 1): Pt will maintain unsupported sitting balance during ADL session with close supervision  OT Short Term Goal 2 (Week 1): Pt will initiate completion of 1 grooming task  OT Short Term Goal 3 (Week 1): Pt will complete UB dressing with Mod A  Skilled Therapeutic Interventions/Progress Updates:    Upon entering the room, pt supine in bed sleeping. Pt grunting , shacking head "no", and "swating" at therapist during this session. OT providing maximal encouragement for participation. Pt refused meal and OOB activities. When therapist speaking to patient he would close eyes to go back to sleep.OT attempting to take vital with BP in supine being 109/44. Pt would not allow therapist to check any other vitals. Pt declined all OT intervention this session. Bed lowered, mats places on floor for safety, and bed alarm activated upon exiting the room.   Therapy Documentation Precautions:  Precautions Precautions: Fall Required Braces or Orthoses: Other Brace/Splint Other Brace/Splint: Short leg cast Restrictions Weight Bearing Restrictions: Yes LLE Weight Bearing: Non weight bearing Other Position/Activity Restrictions: Per initial ED note, pt to be NWB and follow up with ortho in office  General: General OT Amount of Missed Time: 35 Minutes Vital Signs:  Pain:   ADL: ADL ADL Comments: Please see functional navigator for ADL status  See Function Navigator for Current Functional Status.   Therapy/Group: Individual Therapy  [name] 03/04/2017, 1:27 PM

## 2017-03-04 NOTE — Progress Notes (Signed)
Physical Therapy Weekly Progress Note  Patient Details  Name: [name] MRN: [id number] Date of Birth: 28-Dec-1950  Beginning of progress report period: February 27, 2017 End of progress report period: March 05, 2017  Today's Date: 03/05/2017  Patient has met 0 of 5 short term goals.  Pt is making very slow progress towards all PT goals as he continues to be limited by medical issues. Pt with great difficulty following one step commands during session and very rarely verbally communicates with therapist. Pt currently requires total assist (maximove) to safely transfer bed<>w/c. Pt's schedule has been reduced to 15/7 after speaking with PA. Pt's goals have been downgraded to mod<>max assist overall from w/c level and ambulation goals have been discontinued at this time. Pt would benefit from continued skilled PT treatment to focus on bed mobility, transfers, w/c mobility, cognitive remediation, and pt/family education prior to d/c.  Patient continues to demonstrate the following deficits muscle weakness, decreased cardiorespiratoy endurance, decreased coordination, decreased initiation, decreased attention, decreased awareness, decreased problem solving, decreased safety awareness, decreased memory and delayed processing, and decreased standing balance, decreased postural control, decreased balance strategies and difficulty maintaining precautions and therefore will continue to benefit from skilled PT intervention to increase functional independence with mobility.  Patient not progressing toward long term goals.  See goal revision..  Plan of care revisions: goals have been downgraded to mod<>max assist overall..  PT Short Term Goals Week 1:  PT Short Term Goal 1 (Week 1): Pt will transfer bed<>chair w/ LRAD Max assist x1 PT Short Term Goal 1 - Progress (Week 1): Not met PT Short Term Goal 2 (Week 1): Pt will maintain WB precautions in LLE w/ min cues 50% of time  PT Short Term Goal 2 - Progress  (Week 1): Not met PT Short Term Goal 3 (Week 1): Pt will initiate gait training  PT Short Term Goal 3 - Progress (Week 1): Not met PT Short Term Goal 4 (Week 1): Pt will perform bed mobility w/ Mod assist PT Short Term Goal 4 - Progress (Week 1): Not met PT Short Term Goal 5 (Week 1): Pt will self-propel w/c 25' w/ min assist PT Short Term Goal 5 - Progress (Week 1): Not met Week 2:  PT Short Term Goal 1 (Week 2): Pt will consistently completed bed mobility with max assist +1. PT Short Term Goal 2 (Week 2): Pt will complete slide board transfers bed<>w/c with max assist +1. PT Short Term Goal 3 (Week 2): Pt will propel w/c 15 ft with max assist +1.    Therapy Documentation Precautions:  Precautions Precautions: Fall Required Braces or Orthoses: Other Brace/Splint Other Brace/Splint: Short leg cast Restrictions Weight Bearing Restrictions: Yes LLE Weight Bearing: Non weight bearing Other Position/Activity Restrictions: Per initial ED note, pt to be NWB and follow up with ortho in office    See Function Navigator for Current Functional Status.  Therapy/Group: Individual Therapy  [name] 03/05/2017, 8:03 AM

## 2017-03-04 NOTE — Progress Notes (Signed)
Speech Language Pathology Daily Session Note  Patient Details  Name: [name] MRN: [id number] Date of Birth: [birth year]  Today's Date: 03/04/2017 SLP Individual Time: 0867-6195 SLP Individual Time Calculation (min): 45 min  Short Term Goals: Week 1: SLP Short Term Goal 1 (Week 1): Pt will follow 1-step commands within or outside of context given min A visual cues. SLP Short Term Goal 2 (Week 1): Pt will answer basic yes/no questions given supervision question cues. SLP Short Term Goal 3 (Week 1): Pt will consume Dysphagia 1 textures with honey-thick liquids with minimal overt s/s of aspiration across 3 sessions with Max A verbal cues for use of strategies. SLP Short Term Goal 4 (Week 1): Pt will answer orientation questions given max A verbal cues.   Skilled Therapeutic Interventions: Skilled treatment session focused on cognitive and dysphagia goals. SLP facilitated session by providing Max A verbal and tactile cues to maintain arousal for ~1-2 minute intervals. Patient initiated self-feeding X 1 with 4 consecutive bites but required overall hand over hand for remainder of meal. Patient consumed meal with 2 overt coughing episodes but difficult to determine if due to dysphagia due to baseline coughing. Recommend patient continue current diet. Patient essentially nonverbal throughout session and utilized grunts to communicate. Patient left with RN. Continue with current plan of care.      Function:  Eating Eating   Modified Consistency Diet: Yes Eating Assist Level: Supervision or verbal cues;Hand over hand assist;Set up assist for;More than reasonable amount of time   Eating Set Up Assist For: Opening containers       Cognition Comprehension Comprehension assist level: Understands basic 25 - 49% of the time/ requires cueing 50 - 75% of the time  Expression   Expression assist level: Expresses basic 25 - 49% of the time/requires cueing 50 - 75% of the time. Uses single  words/gestures.  Social Interaction Social Interaction assist level: Interacts appropriately 25 - 49% of time - Needs frequent redirection.  Problem Solving Problem solving assist level: Solves basic 25 - 49% of the time - needs direction more than half the time to initiate, plan or complete simple activities  Memory Memory assist level: Recognizes or recalls less than 25% of the time/requires cueing greater than 75% of the time    Pain No/Denies Pain   Therapy/Group: Individual Therapy  Cullen Vanallen 03/04/2017, 1:33 PM

## 2017-03-05 ENCOUNTER — Inpatient Hospital Stay (HOSPITAL_COMMUNITY): Payer: Medicare Other | Admitting: Occupational Therapy

## 2017-03-05 ENCOUNTER — Inpatient Hospital Stay (HOSPITAL_COMMUNITY): Payer: Medicare Other | Admitting: Speech Pathology

## 2017-03-05 ENCOUNTER — Inpatient Hospital Stay (HOSPITAL_COMMUNITY): Payer: Medicare Other | Admitting: Physical Therapy

## 2017-03-05 ENCOUNTER — Inpatient Hospital Stay (HOSPITAL_COMMUNITY)
Admission: AD | Admit: 2017-03-05 | Discharge: 2017-03-11 | DRG: 314 | Disposition: A | Discharge status: Hospice | Payer: Medicare Other | Source: Ambulatory Visit | Attending: Family Medicine | Admitting: Family Medicine

## 2017-03-05 DIAGNOSIS — Z7189 Other specified counseling: Secondary | ICD-10-CM

## 2017-03-05 DIAGNOSIS — N179 Acute kidney failure, unspecified: Secondary | ICD-10-CM | POA: Diagnosis present

## 2017-03-05 DIAGNOSIS — Z992 Dependence on renal dialysis: Secondary | ICD-10-CM | POA: Diagnosis not present

## 2017-03-05 DIAGNOSIS — R0602 Shortness of breath: Secondary | ICD-10-CM

## 2017-03-05 DIAGNOSIS — Z8546 Personal history of malignant neoplasm of prostate: Secondary | ICD-10-CM

## 2017-03-05 DIAGNOSIS — Z96643 Presence of artificial hip joint, bilateral: Secondary | ICD-10-CM | POA: Diagnosis present

## 2017-03-05 DIAGNOSIS — T82510A Breakdown (mechanical) of surgically created arteriovenous fistula, initial encounter: Secondary | ICD-10-CM | POA: Diagnosis present

## 2017-03-05 DIAGNOSIS — K219 Gastro-esophageal reflux disease without esophagitis: Secondary | ICD-10-CM | POA: Diagnosis present

## 2017-03-05 DIAGNOSIS — Y832 Surgical operation with anastomosis, bypass or graft as the cause of abnormal reaction of the patient, or of later complication, without mention of misadventure at the time of the procedure: Secondary | ICD-10-CM | POA: Diagnosis present

## 2017-03-05 DIAGNOSIS — N189 Chronic kidney disease, unspecified: Secondary | ICD-10-CM | POA: Diagnosis not present

## 2017-03-05 DIAGNOSIS — W19XXXD Unspecified fall, subsequent encounter: Secondary | ICD-10-CM | POA: Diagnosis not present

## 2017-03-05 DIAGNOSIS — N186 End stage renal disease: Secondary | ICD-10-CM | POA: Diagnosis present

## 2017-03-05 DIAGNOSIS — Z515 Encounter for palliative care: Secondary | ICD-10-CM | POA: Diagnosis not present

## 2017-03-05 DIAGNOSIS — N185 Chronic kidney disease, stage 5: Secondary | ICD-10-CM

## 2017-03-05 DIAGNOSIS — I10 Essential (primary) hypertension: Secondary | ICD-10-CM | POA: Diagnosis not present

## 2017-03-05 DIAGNOSIS — Z8249 Family history of ischemic heart disease and other diseases of the circulatory system: Secondary | ICD-10-CM

## 2017-03-05 DIAGNOSIS — D631 Anemia in chronic kidney disease: Secondary | ICD-10-CM | POA: Diagnosis present

## 2017-03-05 DIAGNOSIS — Z96611 Presence of right artificial shoulder joint: Secondary | ICD-10-CM | POA: Diagnosis present

## 2017-03-05 DIAGNOSIS — I120 Hypertensive chronic kidney disease with stage 5 chronic kidney disease or end stage renal disease: Secondary | ICD-10-CM | POA: Diagnosis present

## 2017-03-05 DIAGNOSIS — F17210 Nicotine dependence, cigarettes, uncomplicated: Secondary | ICD-10-CM | POA: Diagnosis present

## 2017-03-05 DIAGNOSIS — I674 Hypertensive encephalopathy: Secondary | ICD-10-CM | POA: Diagnosis present

## 2017-03-05 DIAGNOSIS — G931 Anoxic brain damage, not elsewhere classified: Secondary | ICD-10-CM | POA: Diagnosis present

## 2017-03-05 DIAGNOSIS — T82868A Thrombosis of vascular prosthetic devices, implants and grafts, initial encounter: Secondary | ICD-10-CM | POA: Diagnosis present

## 2017-03-05 DIAGNOSIS — E1122 Type 2 diabetes mellitus with diabetic chronic kidney disease: Secondary | ICD-10-CM | POA: Diagnosis present

## 2017-03-05 DIAGNOSIS — E785 Hyperlipidemia, unspecified: Secondary | ICD-10-CM | POA: Diagnosis present

## 2017-03-05 DIAGNOSIS — T82590A Other mechanical complication of surgically created arteriovenous fistula, initial encounter: Secondary | ICD-10-CM

## 2017-03-05 DIAGNOSIS — Z7409 Other reduced mobility: Secondary | ICD-10-CM

## 2017-03-05 DIAGNOSIS — Z66 Do not resuscitate: Secondary | ICD-10-CM | POA: Diagnosis not present

## 2017-03-05 DIAGNOSIS — S82832D Other fracture of upper and lower end of left fibula, subsequent encounter for closed fracture with routine healing: Secondary | ICD-10-CM

## 2017-03-05 DIAGNOSIS — G9340 Encephalopathy, unspecified: Secondary | ICD-10-CM | POA: Diagnosis not present

## 2017-03-05 DIAGNOSIS — N2581 Secondary hyperparathyroidism of renal origin: Secondary | ICD-10-CM | POA: Diagnosis present

## 2017-03-05 DIAGNOSIS — R627 Adult failure to thrive: Secondary | ICD-10-CM | POA: Diagnosis present

## 2017-03-05 DIAGNOSIS — J449 Chronic obstructive pulmonary disease, unspecified: Secondary | ICD-10-CM | POA: Diagnosis present

## 2017-03-05 DIAGNOSIS — I6783 Posterior reversible encephalopathy syndrome: Secondary | ICD-10-CM | POA: Diagnosis present

## 2017-03-05 DIAGNOSIS — G9341 Metabolic encephalopathy: Secondary | ICD-10-CM | POA: Diagnosis not present

## 2017-03-05 DIAGNOSIS — I959 Hypotension, unspecified: Secondary | ICD-10-CM | POA: Diagnosis present

## 2017-03-05 DIAGNOSIS — R34 Anuria and oliguria: Secondary | ICD-10-CM | POA: Diagnosis present

## 2017-03-05 LAB — CBC
HCT: 43.5 % (ref 39.0–52.0)
Hemoglobin: 14.7 g/dL (ref 13.0–17.0)
MCH: 34.7 pg — AB (ref 26.0–34.0)
MCHC: 33.8 g/dL (ref 30.0–36.0)
MCV: 102.6 fL — ABNORMAL HIGH (ref 78.0–100.0)
PLATELETS: 317 10*3/uL (ref 150–400)
RBC: 4.24 MIL/uL (ref 4.22–5.81)
RDW: 15.7 % — AB (ref 11.5–15.5)
WBC: 18.3 10*3/uL — ABNORMAL HIGH (ref 4.0–10.5)

## 2017-03-05 LAB — GLUCOSE, CAPILLARY
GLUCOSE-CAPILLARY: 157 mg/dL — AB (ref 65–99)
GLUCOSE-CAPILLARY: 139 mg/dL — AB (ref 65–99)
Glucose-Capillary: 100 mg/dL — ABNORMAL HIGH (ref 65–99)
Glucose-Capillary: 119 mg/dL — ABNORMAL HIGH (ref 65–99)
Glucose-Capillary: 110 mg/dL — ABNORMAL HIGH (ref 65–99)

## 2017-03-05 LAB — HEMOGLOBIN A1C
Hgb A1c MFr Bld: 5.3 % (ref 4.8–5.6)
Mean Plasma Glucose: 105.41 mg/dL

## 2017-03-05 MED ORDER — LORAZEPAM 2 MG/ML IJ SOLN: 1.0000 mg | Freq: Four times a day (QID) | INTRAMUSCULAR | Status: DC | PRN

## 2017-03-05 MED ORDER — SEVELAMER CARBONATE 2.4 G PO PACK
2.4000 g | PACK | Freq: Three times a day (TID) | ORAL | Status: DC
  Filled 2017-03-05 (×8): qty 1

## 2017-03-05 MED ORDER — CINACALCET HCL 30 MG PO TABS: 120.0000 mg | ORAL_TABLET | Freq: Every day | ORAL | Status: DC

## 2017-03-05 MED ORDER — FLUCONAZOLE 100 MG PO TABS
100.0000 mg | ORAL_TABLET | Freq: Every day | ORAL | Status: DC
  Filled 2017-03-05 (×3): qty 1

## 2017-03-05 MED ORDER — HYDROCODONE-ACETAMINOPHEN 5-325 MG PO TABS: 1.0000 | ORAL_TABLET | Freq: Four times a day (QID) | ORAL | Status: DC | PRN

## 2017-03-05 MED ORDER — NEPRO/CARBSTEADY PO LIQD
237.0000 mL | Freq: Two times a day (BID) | ORAL | Status: DC
  Administered 2017-03-11: 237 mL via ORAL
  Filled 2017-03-05 (×15): qty 237

## 2017-03-05 MED ORDER — HEPARIN SODIUM (PORCINE) 1000 UNIT/ML DIALYSIS: 8000.0000 [IU] | Freq: Once | INTRAMUSCULAR | Status: DC

## 2017-03-05 MED ORDER — INSULIN ASPART 100 UNIT/ML ~~LOC~~ SOLN
0.0000 [IU] | Freq: Three times a day (TID) | SUBCUTANEOUS | Status: DC
  Administered 2017-03-06: 1 [IU] via SUBCUTANEOUS

## 2017-03-05 MED ORDER — PENTAFLUOROPROP-TETRAFLUOROETH EX AERO: 1.0000 "application " | INHALATION_SPRAY | CUTANEOUS | Status: DC | PRN

## 2017-03-05 MED ORDER — ALBUTEROL SULFATE (2.5 MG/3ML) 0.083% IN NEBU: 2.5000 mg | INHALATION_SOLUTION | Freq: Four times a day (QID) | RESPIRATORY_TRACT | Status: DC | PRN

## 2017-03-05 MED ORDER — HEPARIN SODIUM (PORCINE) 1000 UNIT/ML DIALYSIS
100.0000 [IU]/kg | INTRAMUSCULAR | Status: DC | PRN
  Filled 2017-03-05: qty 9

## 2017-03-05 MED ORDER — HEPARIN SODIUM (PORCINE) 1000 UNIT/ML DIALYSIS
1000.0000 [IU] | INTRAMUSCULAR | Status: DC | PRN
  Administered 2017-03-08: 1000 [IU] via INTRAVENOUS_CENTRAL

## 2017-03-05 MED ORDER — VITAMIN D 1000 UNITS PO TABS
1000.0000 [IU] | ORAL_TABLET | Freq: Every day | ORAL | Status: DC
  Administered 2017-03-06: 1000 [IU] via ORAL
  Filled 2017-03-05 (×4): qty 1

## 2017-03-05 MED ORDER — SODIUM CHLORIDE 0.9 % IV SOLN: 100.0000 mL | INTRAVENOUS | Status: DC | PRN

## 2017-03-05 MED ORDER — HALOPERIDOL 2 MG PO TABS
2.0000 mg | ORAL_TABLET | Freq: Three times a day (TID) | ORAL | Status: DC | PRN
  Administered 2017-03-05: 2 mg via ORAL
  Filled 2017-03-05: qty 1
  Filled 2017-03-05 (×2): qty 2

## 2017-03-05 MED ORDER — HALOPERIDOL LACTATE 5 MG/ML IJ SOLN: 2.0000 mg | Freq: Four times a day (QID) | INTRAMUSCULAR | Status: DC | PRN

## 2017-03-05 MED ORDER — FOLIC ACID 1 MG PO TABS
1.0000 mg | ORAL_TABLET | Freq: Every day | ORAL | Status: DC
  Filled 2017-03-05 (×3): qty 1

## 2017-03-05 MED ORDER — HEPARIN SODIUM (PORCINE) 5000 UNIT/ML IJ SOLN
5000.0000 [IU] | Freq: Three times a day (TID) | INTRAMUSCULAR | Status: DC
  Administered 2017-03-06 – 2017-03-07 (×2): 5000 [IU] via SUBCUTANEOUS
  Filled 2017-03-05 (×3): qty 1

## 2017-03-05 MED ORDER — DOCUSATE SODIUM 100 MG PO CAPS
100.0000 mg | ORAL_CAPSULE | Freq: Two times a day (BID) | ORAL | Status: DC
  Filled 2017-03-05: qty 1

## 2017-03-05 MED ORDER — ATORVASTATIN CALCIUM 20 MG PO TABS
20.0000 mg | ORAL_TABLET | Freq: Every day | ORAL | Status: DC
  Filled 2017-03-05: qty 2
  Filled 2017-03-05: qty 1

## 2017-03-05 MED ORDER — LIDOCAINE HCL (PF) 1 % IJ SOLN: 5.0000 mL | INTRAMUSCULAR | Status: DC | PRN

## 2017-03-05 MED ORDER — ALTEPLASE 2 MG IJ SOLR: 2.0000 mg | Freq: Once | INTRAMUSCULAR | Status: DC | PRN

## 2017-03-05 MED ORDER — LIDOCAINE-PRILOCAINE 2.5-2.5 % EX CREA: 1.0000 "application " | TOPICAL_CREAM | CUTANEOUS | Status: DC | PRN

## 2017-03-05 MED ORDER — PANTOPRAZOLE SODIUM 40 MG PO TBEC
40.0000 mg | DELAYED_RELEASE_TABLET | Freq: Every day | ORAL | Status: DC
  Administered 2017-03-06: 40 mg via ORAL
  Filled 2017-03-05: qty 1

## 2017-03-05 NOTE — Discharge Summary (Signed)
Discharge summary job # [id number]

## 2017-03-05 NOTE — Discharge Summary (Signed)
NAMEMARSHEL, GOLUBSKI                   ACCOUNT NO.:  [id number]  MEDICAL RECORD NO.:  [id number]  LOCATION:  4W08C                        FACILITY:  [facility]  PHYSICIAN:  Meredith Staggers, M.D.DATE OF BIRTH:  1950-05-21  DATE OF ADMISSION:  02/26/2017 DATE OF DISCHARGE:  03/05/2017                              DISCHARGE SUMMARY   DISCHARGE DIAGNOSES: 1. Metabolic encephalopathy with history of closed left distal fibula     fracture. 2. Uremia. 3. Subcutaneous heparin for deep vein thrombosis prophylaxis. 4. End-stage renal disease, on hemodialysis. 5. Hypertension. 6. Klebsiella urinary tract infection. 7. Diabetes mellitus, peripheral neuropathy. 8. Acute on chronic anemia. 9. Chronic obstructive pulmonary disease with tobacco abuse.  This is a 67 year old right-handed male with history of end-stage renal disease, hemodialysis, hypertension, COPD, tobacco abuse as well as recent left fibular fracture, nonweightbearing.  He was receiving inpatient rehab services, on February 03, 2017, to February 16, 2017, with PRES.  Discharged to home with home therapies, requiring some assistance to maintain his weightbearing status.  He could propel his wheelchair with supervision and assistance of his daughter.  Readmitted on February 19, 2017, with elevated blood pressure at 230/190, altered mental status.  He had missed his last dialysis session, required intubation for airway support.  Cranial CT scan negative.  Chest x-ray negative. Troponin 0.03.  Lactic acid within normal limits.  Ammonia level mildly elevated at 42.  He was extubated on February 23, 2017.  Urine culture, Klebsiella, maintained on Ancef.  MRSA PCR screening positive, with contact precautions.  Noted ongoing bouts of confusion, suspected acute encephalopathy, likely hypertensive related.  Subcutaneous heparin for DVT prophylaxis.  Hemodialysis ongoing.  The patient was admitted for comprehensive rehab program.  PAST MEDICAL  HISTORY:  See discharge diagnoses.  SOCIAL HISTORY:  Lives with daughter.  FUNCTIONAL STATUS UPON ADMISSION TO REHAB SERVICES:  +2 physical assist sit to stand, +2 physical assist supine to sit, max to total assist activities of daily living.  PHYSICAL EXAMINATION:  VITAL SIGNS:  Blood pressure 135/59, pulse 85, temperature 98, respirations 18. GENERAL:  Lethargic male.  He did follow some simple commands. Decreased insight and awareness.  Limited problem solving. NECK:  Supple.  Nontender.  No JVD. CARDIAC:  Rate controlled. ABDOMEN:  Soft, nontender.  Good bowel sounds. LUNGS:  Decreased breath sounds.  Clear to auscultation.  He had a short leg cast in place, left lower extremity.  REHABILITATION HOSPITAL COURSE:  The patient was admitted to Inpatient Rehab Services with therapies initiated on a 3-hour daily basis, consisting of physical therapy, occupational therapy, speech therapy, and rehabilitation nursing.  The following issues were addressed during the patient's rehabilitation stay.  Pertaining to Mr. Mccarry metabolic encephalopathy, debilitation, recent closed left distal fibula fracture, he had a short leg cast, nonweightbearing.  Subcutaneous heparin for DVT prophylaxis.  The patient with ongoing bouts of confusion, suspect uremia, playing a larger role.  Latest Chest x-ray unremarkable.  His hemodialysis was ongoing.  Limited participation with his therapies.  He was on a dysphagia #1 honey thick liquid diet, close monitoring of hydration.  He completed a course of Ancef for Klebsiella UTI.  Blood sugars  overall monitored with fair control.  Acute on chronic anemia with iron supplement per hemodialysis.  He did have a history of COPD, history of tobacco abuse.  Latest chest x-ray unremarkable.  The patient received weekly collaborative interdisciplinary team conferences to discuss estimated length of stay, family teaching, any barriers to discharge.  The patient with  limited participation with therapies.  He was barely able to tolerate sitting at the edge of the bed for 5 minutes.  Required total assist to return to supine and dependent assist to scoot to head of bed.  Due to these limited advances, suspect uremia. He was discharged to [facility] for ongoing care, hemodialysis ongoing as advised.  Discharge medications as per medical team upon discharge to acute care services.  He would continue on a dysphagia #1 honey thick liquid diet.  He was nonweightbearing, left lower extremity.     Lauraine Rinne, P.A.   ______________________________ Meredith Staggers, M.D.    DA/MEDQ  D:  03/05/2017  T:  03/05/2017  Job:  [id number]  cc:   Meredith Staggers, M.D.

## 2017-03-05 NOTE — Progress Notes (Signed)
Pt continues to be agitated.  BP noted to be low with increasing heart rate.  Reported to X. Blount, NP.  BP consistent with previous values noted throughout .  Pt mentation confused but answering simple commands, unchanged from baseline on admission and more alert than this RN received in report  Order received for cardiac monitoring.  Cardiac monitor placed showing ST with HR 111-119.       03/05/17 2145  Vitals  Temp 97.8 F (36.6 C)  Temp Source Axillary  BP (!) 86/57  BP Location Left Arm  BP Method Automatic  Patient Position (if appropriate) Lying  Pulse Rate (!) 118  Pulse Rate Source Dinamap  Resp 18  Oxygen Therapy  SpO2 96 %  O2 Device Room Air

## 2017-03-05 NOTE — Progress Notes (Signed)
HD tx initiated via HD cath w/ cath function issues, AP, no pull at all at first and very little after pt cough, push/flush well, VP: pull/push/flush well, lines reversed, despite ordered heparin and it's admin, system began clotting off almost immediately, VSS, will cont to monitor while on HD tx

## 2017-03-05 NOTE — Progress Notes (Signed)
[facility] Progress Note   Dialysis Orders: MWF [facility] 4h 69min 2/2 bath 400/80085kg Hep 8600 R AVF -Calcitriol 2.0 mcg PO TIW -Sensipar 120 mg PO TIW -Venofer 50 mg IV weekly -Mircera 60 mcg IV q 2 weeks (Last dose 02/12/17)   Assessment/Plan: 1.Encephalopathy - uremic +/- other. S/p cath exchange- MS hasn't improved with HD yet  2. ESRD- HD MWF - for HD today later; will actually defer until Saturday since he is changing to inpatient status- finished HD 1/25 am; system required frequent flushes and heparin last night- 3. Anemiaof CKD-Hgb high 12 >14 - on weekly Fe; stop Aranesp for now 4. Secondary hyperparathyroidism- Ca up to 9.9 - resumed sensipar 1/25 - hold on resuming calcitriol until P comes down;  continue binders.- has been taking binders for the most part - hold on increasing for now - even though P elevated 5.HTN/volume- BP controlled, net UF 1 L 1/23 and again when he finished earlier today; CXR 1/22 - neg keep even next tmt 6. COPD - per primary 7. DM - per primary 8. Ankle frx - per primary left LE cast 9. Deconditioning  10. Tachycardia /leukocytosis - HR ^ new leukocytosis; temp 99.1 weights suggest he is not dry but hemoconcentration suggests otherwise- work up per Hospitalists when admitted -[place] UTI 1/12- next 1/22 ; last BC drawn 1/11 CXR neg 1/21 11. MRSA precautions 12. Nutrition - intake marginal/suppl ordered - on D1 diet- honey thick  Myriam Jacobson, PA-C [facility] [phone] 03/05/2017,10:16 AM  LOS: 7 days   Pt seen, examined and agree w A/P as above.  Kelly Splinter MD [facility] pager [phone]   03/05/2017, 12:43 PM    Subjective:   Not answering questions   Objective Vitals:   03/05/17 0030 03/05/17 0100 03/05/17 0107 03/05/17 0130  BP: (!) 88/47 (!) 97/59 109/65 98/76  Pulse: (!) 140 (!) 140 (!) 132 (!) 112  Resp: (!) 31 (!) 24 19 14   Temp:   98.9 F (37.2 C)  98.2 F (36.8 C)  TempSrc:   Axillary Axillary  SpO2: 93% 93% 98% 99%  Weight:   87 kg (191 lb 12.8 oz)   Height:       Physical Exam General: eyes closed - not interactive Heart: tachy Lungs: CTA Abdomen: soft NT Extremities: LLE in short cast - right LE no edema Dialysis Access: right IJ [facility]   Additional Objective Labs: Basic Metabolic Panel: Recent Labs  Lab 03/02/17 2044 03/03/17 1745 03/04/17 2112  NA 142 140 139  K 4.5 4.3 4.6  CL 96* 97* 97*  CO2 21* 22 21*  GLUCOSE 199* 183* 136*  BUN 117* 124* 71*  CREATININE 18.22* 17.35* 13.16*  CALCIUM 9.9 9.9 9.9  PHOS 7.2* 7.1* 7.3*   Liver Function Tests: Recent Labs  Lab 03/02/17 2044 03/03/17 1745 03/04/17 2112  ALBUMIN 3.5 3.5 3.7   CBC: Recent Labs  Lab 03/02/17 2043 03/03/17 1745 03/04/17 2112 03/05/17 0627  WBC 13.9* 14.0* 16.4* 18.3*  HGB 12.8* 12.5* 12.8* 14.7  HCT 39.0 37.8* 39.2 43.5  MCV 102.4* 101.9* 102.6* 102.6*  PLT 446* 410* 322 317   Blood Culture    Component Value Date/Time   SDES URINE, CLEAN CATCH 03/02/2017 1300   SPECREQUEST NONE 03/02/2017 1300   CULT NO GROWTH 03/02/2017 1300   REPTSTATUS 03/03/2017 FINAL 03/02/2017 1300    Cardiac Enzymes: No results for input(s): CKTOTAL, CKMB, CKMBINDEX, TROPONINI in the last 168 hours. CBG:  Recent Labs  Lab 03/03/17 2211 03/04/17 0628 03/04/17 1153 03/05/17 0129 03/05/17 0705  GLUCAP 149* 146* 149* 157* 100*   Iron Studies: No results for input(s): IRON, TIBC, TRANSFERRIN, FERRITIN in the last 72 hours. Lab Results  Component Value Date   INR 0.99 02/19/2017   INR 1.7 (H) 09/11/2007   INR 1.5 09/10/2007   Studies/Results: Ir Fluoro Guide Cv Line Right  Result Date: 03/03/2017 INDICATION: 67 year old male with a right IJ approach tunneled hemodialysis catheter which is poorly functioning. The catheter was placed at an outside institution. Patient presents for catheter exchange. EXAM: TUNNELED CENTRAL VENOUS HEMODIALYSIS  CATHETER PLACEMENT WITH ULTRASOUND AND FLUOROSCOPIC GUIDANCE MEDICATIONS: None ANESTHESIA/SEDATION: None FLUOROSCOPY TIME:  Fluoroscopy Time: 0 minutes 24 seconds (1 mGy). COMPLICATIONS: None immediate. PROCEDURE: Informed written consent was obtained from the patient after a discussion of the risks, benefits, and alternatives to treatment. Questions regarding the procedure were encouraged and answered. The right neck and chest were prepped with chlorhexidine in a sterile fashion, and a sterile drape was applied covering the operative field. Maximum barrier sterile technique with sterile gowns and gloves were used for the procedure. A timeout was performed prior to the initiation of the procedure. Local anesthesia was attained by infiltration with 1% lidocaine. Using sharp dissection, the fabric cuff of the existing dialysis catheter was dissected free. A stiff Glidewire was advanced to the level of the IVC and the existing hemodialysis catheter was removed. A palindrome tunneled hemodialysis catheter measuring 23 cm from tip to cuff was then advanced over the wire, through the tunnel and into the right heart with tips ultimately positioned within the superior aspect of the right atrium. Final catheter positioning was confirmed and documented with a spot radiographic image. The catheter aspirates and flushes normally. The catheter was flushed with appropriate volume heparin dwells. The catheter exit site was secured with a 0-Prolene retention suture. Dressings were applied. The patient tolerated the procedure well without immediate post procedural complication. IMPRESSION: Successful exchange for a new 23 cm tip to cuff tunneled hemodialysis catheter via the right internal jugular vein with tips terminating within the superior aspect of the right atrium. The catheter is ready for immediate use. Electronically Signed   By: Jacqulynn Cadet M.D.   On: 03/03/2017 16:34   Medications: . sodium chloride    . sodium  chloride    . sodium chloride    . sodium chloride    . sodium chloride    . sodium chloride    . ferric gluconate (FERRLECIT/NULECIT) IV     . darbepoetin (ARANESP) injection - DIALYSIS  100 mcg Intravenous Q Fri-HD  . docusate  100 mg Per Tube BID  . feeding supplement (NEPRO CARB STEADY)  237 mL Oral BID BM  . fluconazole  100 mg Oral Daily  . heparin injection (subcutaneous)  5,000 Units Subcutaneous Q8H  . insulin aspart  0-9 Units Subcutaneous TID WC & HS  . multivitamin  1 tablet Oral QHS  . pantoprazole  40 mg Oral QHS  . sevelamer carbonate  1,600 mg Oral TID WC

## 2017-03-05 NOTE — Progress Notes (Signed)
HD tx completed @ 0100 w/ bp/HR issues throughout tx requiring UF off for approx 1hr of tx, also despite new cath placement the system started clotting off almost immediately after tx initiated and it required multiple NS 222m flushes and heparin to try and keep system from clotting off, pt would raise his head and cause the machine to alarm, UF goal not met, blood rinsed back, VSS w/ increased HR but lower than it had gotten during tx, report called to SRenato Gails RN

## 2017-03-05 NOTE — Progress Notes (Signed)
Physical Therapy Session Note  Patient Details  Name: [name] MRN: [id number] Date of Birth: 1950-06-23  Today's Date: 03/04/2017 PT Individual Time:1515-1600   45 min  Short Term Goals: Week 1:  PT Short Term Goal 1 (Week 1): Pt will transfer bed<>chair w/ LRAD Max assist x1 PT Short Term Goal 1 - Progress (Week 1): Not met PT Short Term Goal 2 (Week 1): Pt will maintain WB precautions in LLE w/ min cues 50% of time  PT Short Term Goal 2 - Progress (Week 1): Not met PT Short Term Goal 3 (Week 1): Pt will initiate gait training  PT Short Term Goal 3 - Progress (Week 1): Not met PT Short Term Goal 4 (Week 1): Pt will perform bed mobility w/ Mod assist PT Short Term Goal 4 - Progress (Week 1): Not met PT Short Term Goal 5 (Week 1): Pt will self-propel w/c 25' w/ min assist PT Short Term Goal 5 - Progress (Week 1): Not met  Skilled Therapeutic Interventions/Progress Updates:   Pt received supine in bed for PT treatment with no visible signs of pain. Pt agrees to come sit EOB. Supine>sit with mod assist. Pt able to initiate scoot to EOB with mod-max assist from PT. Once sitting EOB, Pt reports agrees to attempt SB to [place]. Max assist fot PT place SB. Mod-fading to max assist to transfer to [facility]. Once in [facility], Pt declines any mobility outside of room, and noted to be perseverating on soiled spot on hand. PT placed pt at sink, and pt was able to initiate hand washing task by tuning on  Water and rinsing hand. PT required to cue pt to use soap and for use of paper towel. Following hand drying, Pt also noted to wash face with paper towel and then attempt to clean sink. Pt returned  To bed with  Total assist SB transfer. Sit>supine completed with total assist, and pt left supine in bed with call bell in reach and all needs met.       Therapy Documentation Precautions:  Precautions Precautions: Fall Required Braces or Orthoses: Other Brace/Splint Other Brace/Splint: Short leg  cast Restrictions Weight Bearing Restrictions: Yes LLE Weight Bearing: Non weight bearing Other Position/Activity Restrictions: Per initial ED note, pt to be NWB and follow up with ortho in office  Vital Signs: Therapy Vitals Temp: 98.2 F (36.8 C) Temp Source: Axillary Pulse Rate: (!) 112 Resp: 14 BP: 98/76 Patient Position (if appropriate): Lying Oxygen Therapy SpO2: 99 % O2 Device: Not Delivered Pulse Oximetry Type: Continuous Pain: Pain Assessment Pain Assessment: FLACC   See Function Navigator for Current Functional Status.   Therapy/Group: Individual Therapy  Lorie Phenix 03/05/2017, 4:53 AM

## 2017-03-05 NOTE — Progress Notes (Signed)
Pt refusing renal function panel, intermittently combative with staff attempting to hit staff with left hand.  Initially unable to take PO haldol.  Jeannette Corpus notified.  Orders received for IV haldol.  Pt then became compliant long enough to take PO haldol crushed with applesauce.  Lab notified and will attempt to redraw lab in approximately 45 minutes.

## 2017-03-05 NOTE — Progress Notes (Signed)
Speech Language Pathology Daily Session Note  Patient Details  Name: [name] MRN: [id number] Date of Birth: 07-29-1950  Today's Date: 03/05/2017 SLP Individual Time: 9390-3009 SLP Individual Time Calculation (min): 15 min and Today's Date: 03/05/2017 SLP Missed Time: 30 Minutes Missed Time Reason: Patient ill (Comment);Patient fatigue  Short Term Goals: Week 1: SLP Short Term Goal 1 (Week 1): Pt will follow 1-step commands within or outside of context given min A visual cues. SLP Short Term Goal 2 (Week 1): Pt will answer basic yes/no questions given supervision question cues. SLP Short Term Goal 3 (Week 1): Pt will consume Dysphagia 1 textures with honey-thick liquids with minimal overt s/s of aspiration across 3 sessions with Max A verbal cues for use of strategies. SLP Short Term Goal 4 (Week 1): Pt will answer orientation questions given max A verbal cues.   Skilled Therapeutic Interventions: Skilled treatment session focused on cognitive goals. Upon arrival, patient was asleep in bed. Patient would arouse to verbal stimuli for ~10 seconds but then was unable to keep his eyes open and was nonverbal.  O2 and HR checked, O2:96% and HR: 115 bpm at rest. MD and RN aware.  Patient left upright in bed with all needs within reach and alarm on. Continue with current plan of care.      Function:  Cognition Comprehension Comprehension assist level: Understands basic less than 25% of the time/ requires cueing >75% of the time  Expression   Expression assist level: Expresses basis less than 25% of the time/requires cueing >75% of the time.  Social Interaction Social Interaction assist level: Interacts appropriately less than 25% of the time. May be withdrawn or combative.  Problem Solving Problem solving assist level: Solves basic less than 25% of the time - needs direction nearly all the time or does not effectively solve problems and may need a restraint for safety  Memory Memory assist  level: Recognizes or recalls less than 25% of the time/requires cueing greater than 75% of the time    Pain No indications of pain   Therapy/Group: Individual Therapy  Brydan Downard 03/05/2017, 7:55 AM

## 2017-03-05 NOTE — Progress Notes (Signed)
Pt more compliant with staff.  Allowing staff to take vital signs and check cbg, however pt still refusing renal function panel to be drawn after haldol given with pt raising arm/fist to staff when attempting to tie tourniquet to patient's arm and swinging at staff.  Jeannette Corpus, NP notified of refusal of labs.
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
## 2017-03-05 NOTE — Progress Notes (Signed)
Pt. Arrived to unit Alert and disoriented X 4. He is drowsy and unable to answer my question. During part of the nurses assessment , patient  started to pull away and did not want to be touch.  Pt. Is in a low bed and has a Medical illustrator. Call bell in reach  Will continue to monitor.

## 2017-03-05 NOTE — Progress Notes (Signed)
Social Work  Discharge Note  The overall goal for the admission was met for:   Discharge location: No - transferred to acute due to medical issues  Length of Stay: No  Discharge activity level: No  Home/community participation: No  Services provided included: MD, RD, PT, OT, RN, Pharmacy and SW  Financial Services: Medicare  Follow-up services arranged: NA  Comments (or additional information):  At time of transfer, pt had made little to no progress with therapies and team and daughter concerned that his care needs would be too great for d/c home and daughter to resume care.  Patient/Family verbalized understanding of follow-up arrangements:  NA   Individual responsible for coordination of the follow-up plan: NA   Confirmed correct DME delivered: NA    Lorrie Strauch

## 2017-03-05 NOTE — Progress Notes (Signed)
[facility] PHYSICAL MEDICINE & REHABILITATION     PROGRESS NOTE    Subjective/Complaints: Continued issues with HD cath. No change in clinical status. Unable to engage with therapy.    Objective: Vital Signs: Blood pressure 98/76, pulse (!) 112, temperature 98.2 F (36.8 C), temperature source Axillary, resp. rate 14, height 6\' 1"  (1.854 m), weight 87 kg (191 lb 12.8 oz), SpO2 99 %. Ir Fluoro Guide Cv Line Right  Result Date: 03/03/2017 INDICATION: 67 year old male with a right IJ approach tunneled hemodialysis catheter which is poorly functioning. The catheter was placed at an outside institution. Patient presents for catheter exchange. EXAM: TUNNELED CENTRAL VENOUS HEMODIALYSIS CATHETER PLACEMENT WITH ULTRASOUND AND FLUOROSCOPIC GUIDANCE MEDICATIONS: None ANESTHESIA/SEDATION: None FLUOROSCOPY TIME:  Fluoroscopy Time: 0 minutes 24 seconds (1 mGy). COMPLICATIONS: None immediate. PROCEDURE: Informed written consent was obtained from the patient after a discussion of the risks, benefits, and alternatives to treatment. Questions regarding the procedure were encouraged and answered. The right neck and chest were prepped with chlorhexidine in a sterile fashion, and a sterile drape was applied covering the operative field. Maximum barrier sterile technique with sterile gowns and gloves were used for the procedure. A timeout was performed prior to the initiation of the procedure. Local anesthesia was attained by infiltration with 1% lidocaine. Using sharp dissection, the fabric cuff of the existing dialysis catheter was dissected free. A stiff Glidewire was advanced to the level of the IVC and the existing hemodialysis catheter was removed. A palindrome tunneled hemodialysis catheter measuring 23 cm from tip to cuff was then advanced over the wire, through the tunnel and into the right heart with tips ultimately positioned within the superior aspect of the right atrium. Final catheter positioning was  confirmed and documented with a spot radiographic image. The catheter aspirates and flushes normally. The catheter was flushed with appropriate volume heparin dwells. The catheter exit site was secured with a 0-Prolene retention suture. Dressings were applied. The patient tolerated the procedure well without immediate post procedural complication. IMPRESSION: Successful exchange for a new 23 cm tip to cuff tunneled hemodialysis catheter via the right internal jugular vein with tips terminating within the superior aspect of the right atrium. The catheter is ready for immediate use. Electronically Signed   By: Jacqulynn Cadet M.D.   On: 03/03/2017 16:34   Recent Labs    03/03/17 1745 03/04/17 2112  WBC 14.0* 16.4*  HGB 12.5* 12.8*  HCT 37.8* 39.2  PLT 410* 322   Recent Labs    03/03/17 1745 03/04/17 2112  NA 140 139  K 4.3 4.6  CL 97* 97*  GLUCOSE 183* 136*  BUN 124* 71*  CREATININE 17.35* 13.16*  CALCIUM 9.9 9.9   CBG (last 3)  Recent Labs    03/04/17 1153 03/05/17 0129 03/05/17 0705  GLUCAP 149* 157* 100*    Wt Readings from Last 3 Encounters:  03/05/17 87 kg (191 lb 12.8 oz)  02/26/17 79.8 kg (175 lb 14.8 oz)  02/15/17 84.4 kg (186 lb 1.1 oz)    Physical Exam:  Head: Normocephalic.  Thrush on tongue Eyes: EOM are normal. Right eye exhibits discharge. Left eye exhibits no discharge.  Neck: Normal range of motion. Neck supple. No thyromegaly present.  Cardiovascular: tachycardia      Respiratory: no distress GI: Soft. Bowel sounds are normal. He exhibits no distension. There is no tenderness.  Skin. Left leg short leg cast in place  Musculoskeletal. He exhibits tenderness left leg Neurological: confused. Delayed processing  Moves all 4 limbs.  Strength in the upper extremity is grossly 3-4 out of 5 proximal distal. Right Lower extremity strength 3 out of 5 hip flexors knee extensors and 4-5 at the right ankle. Left leg   limited by cast in place  Psych: remains flat,  confused, slow to arouse    Assessment/Plan: 1. Functional deficits secondary to debility and encephalopathy which require 3+ hours per day of interdisciplinary therapy in a comprehensive inpatient rehab setting. Physiatrist is providing close team supervision and 24 hour management of active medical problems listed below. Physiatrist and rehab team continue to assess barriers to discharge/monitor patient progress toward functional and medical goals.  Function:  Bathing Bathing position   Position: Sitting EOB  Bathing parts Body parts bathed by patient: Right arm, Left arm, Chest, Abdomen, Front perineal area Body parts bathed by helper: Buttocks, Right upper leg, Left upper leg, Right lower leg, Left lower leg, Back  Bathing assist        Upper Body Dressing/Undressing Upper body dressing   What is the patient wearing?: Hospital gown                Upper body assist Assist Level: Touching or steadying assistance(Pt > 75%)      Lower Body Dressing/Undressing Lower body dressing   What is the patient wearing?: Non-skid slipper socks           Non-skid slipper socks- Performed by helper: Don/doff right sock                  Lower body assist        Toileting Toileting Toileting activity did not occur: No continent bowel/bladder event   Toileting steps completed by helper: Adjust clothing prior to toileting, Performs perineal hygiene, Adjust clothing after toileting Toileting Assistive Devices: Grab bar or rail  Toileting assist Assist level: Two helpers   Transfers Chair/bed transfer Chair/bed transfer activity did not occur: Safety/medical concerns Chair/bed transfer method: Lateral scoot Chair/bed transfer assist level: Total assist (Pt < 25%) Chair/bed transfer assistive device: Sliding board, Armrests Mechanical lift: Maximove   Locomotion Ambulation Ambulation activity did not occur: Safety/medical Editor, commissioning  activity did not occur: Safety/medical concerns Type: Manual      Cognition Comprehension Comprehension assist level: Understands basic less than 25% of the time/ requires cueing >75% of the time  Expression Expression assist level: Expresses basis less than 25% of the time/requires cueing >75% of the time.  Social Interaction Social Interaction assist level: Interacts appropriately less than 25% of the time. May be withdrawn or combative.  Problem Solving Problem solving assist level: Solves basic less than 25% of the time - needs direction nearly all the time or does not effectively solve problems and may need a restraint for safety  Memory Memory assist level: Recognizes or recalls less than 25% of the time/requires cueing greater than 75% of the time   Medical Problem List and Plan:  1. Decreased functional mobility secondary to metabolic encephalopathy/debility as well as history of closed left distal fibular fracture-short leg cast and nonweightbearing  -Patient unable to participate in rehab given medical issues/lethargy.  Participation has been minimal since admission. -Patient needs transfer back to acute for medical mgt of uremia, further work up. Have spoke with TH 2. DVT Prophylaxis/Anticoagulation: Subcutaneous heparin. Monitor for any bleeding episodes  3. Pain Management: Tylenol as needed  4. Mood: Provide emotional support  5. Neuropsych: This  patient is not capable of making decisions on his own behalf.     6. Skin/Wound Care: Routine skin checks  7. Fluids/Electrolytes/Nutrition:  chemistries with HD  8. End-stage renal disease. Continue hemodialysis as per renal services.  Had dialysis last night and again will have today.  HD catheter changed just in IR 9. Hypertension. Monitor with increased mobility  10. Klebsiella UTI. Ancef completed  11. Diabetes mellitus peripheral neuropathy. SSI.   blood sugars before meals and at bedtime    -Fair  Control at present    -CM  diet 12. Acute on chronic anemia. Continue iron supplement  13. COPD with history of tobacco abuse. Provide counseling when appropriate 14.  Thrush: 3 days of Diflucan p.o.   LOS (Days) 7 A FACE TO FACE EVALUATION WAS PERFORMED  Alger Simons T, MD 03/05/2017 8:30 AM

## 2017-03-06 ENCOUNTER — Inpatient Hospital Stay (HOSPITAL_COMMUNITY): Payer: Medicare Other

## 2017-03-06 DIAGNOSIS — N186 End stage renal disease: Secondary | ICD-10-CM

## 2017-03-06 LAB — GLUCOSE, CAPILLARY
GLUCOSE-CAPILLARY: 141 mg/dL — AB (ref 65–99)
Glucose-Capillary: 142 mg/dL — ABNORMAL HIGH (ref 65–99)

## 2017-03-06 LAB — CBC
HEMATOCRIT: 44 % (ref 39.0–52.0)
Hemoglobin: 14.4 g/dL (ref 13.0–17.0)
MCH: 34.2 pg — ABNORMAL HIGH (ref 26.0–34.0)
MCHC: 32.7 g/dL (ref 30.0–36.0)
MCV: 104.5 fL — AB (ref 78.0–100.0)
Platelets: 343 10*3/uL (ref 150–400)
RBC: 4.21 MIL/uL — ABNORMAL LOW (ref 4.22–5.81)
RDW: 15.6 % — AB (ref 11.5–15.5)
WBC: 17.4 10*3/uL — AB (ref 4.0–10.5)

## 2017-03-06 LAB — COMPREHENSIVE METABOLIC PANEL
ALBUMIN: 4 g/dL (ref 3.5–5.0)
ALT: 39 U/L (ref 17–63)
ANION GAP: 22 — AB (ref 5–15)
AST: 54 U/L — AB (ref 15–41)
Alkaline Phosphatase: 53 U/L (ref 38–126)
BILIRUBIN TOTAL: 0.9 mg/dL (ref 0.3–1.2)
BUN: 59 mg/dL — AB (ref 6–20)
CHLORIDE: 96 mmol/L — AB (ref 101–111)
CO2: 21 mmol/L — ABNORMAL LOW (ref 22–32)
Calcium: 10.1 mg/dL (ref 8.9–10.3)
Creatinine, Ser: 12.19 mg/dL — ABNORMAL HIGH (ref 0.61–1.24)
GFR calc Af Amer: 4 mL/min — ABNORMAL LOW (ref >60)
GFR, EST NON AFRICAN AMERICAN: 4 mL/min — AB (ref >60)
Glucose, Bld: 156 mg/dL — ABNORMAL HIGH (ref 65–99)
Potassium: 5.2 mmol/L — ABNORMAL HIGH (ref 3.5–5.1)
Sodium: 139 mmol/L (ref 135–145)
TOTAL PROTEIN: 7.5 g/dL (ref 6.5–8.1)

## 2017-03-06 LAB — LACTIC ACID, PLASMA: Lactic Acid, Venous: 2.9 mmol/L (ref 0.5–1.9)

## 2017-03-06 MED ORDER — PANTOPRAZOLE SODIUM 40 MG PO PACK
40.0000 mg | PACK | Freq: Every day | ORAL | Status: DC
  Filled 2017-03-06: qty 20

## 2017-03-06 MED ORDER — HEPARIN SODIUM (PORCINE) 1000 UNIT/ML DIALYSIS
8600.0000 [IU] | Freq: Once | INTRAMUSCULAR | Status: AC
  Administered 2017-03-08: 8600 [IU] via INTRAVENOUS_CENTRAL
  Filled 2017-03-06: qty 9

## 2017-03-06 MED ORDER — PIPERACILLIN-TAZOBACTAM 3.375 G IVPB 30 MIN
3.3750 g | Freq: Once | INTRAVENOUS | Status: DC
  Filled 2017-03-06: qty 50

## 2017-03-06 MED ORDER — VANCOMYCIN HCL IN DEXTROSE 750-5 MG/150ML-% IV SOLN
750.0000 mg | INTRAVENOUS | Status: AC
  Administered 2017-03-07: 750 mg via INTRAVENOUS
  Filled 2017-03-06 (×2): qty 150

## 2017-03-06 MED ORDER — STARCH (THICKENING) PO POWD
ORAL | Status: DC | PRN
  Filled 2017-03-06: qty 227

## 2017-03-06 MED ORDER — SODIUM CHLORIDE 0.9 % IV SOLN
INTRAVENOUS | Status: DC
  Administered 2017-03-07 – 2017-03-10 (×3): via INTRAVENOUS

## 2017-03-06 MED ORDER — SODIUM CHLORIDE 0.9 % IV BOLUS (SEPSIS)
1000.0000 mL | Freq: Once | INTRAVENOUS | Status: AC
  Administered 2017-03-06: 1000 mL via INTRAVENOUS

## 2017-03-06 MED ORDER — VANCOMYCIN HCL 10 G IV SOLR
1750.0000 mg | Freq: Once | INTRAVENOUS | Status: AC
  Administered 2017-03-06: 1750 mg via INTRAVENOUS
  Filled 2017-03-06: qty 1750

## 2017-03-06 MED ORDER — VANCOMYCIN HCL IN DEXTROSE 750-5 MG/150ML-% IV SOLN
750.0000 mg | INTRAVENOUS | Status: DC
  Administered 2017-03-08: 750 mg via INTRAVENOUS
  Filled 2017-03-06: qty 150

## 2017-03-06 MED ORDER — DOCUSATE SODIUM 50 MG/5ML PO LIQD
100.0000 mg | Freq: Two times a day (BID) | ORAL | Status: DC
  Administered 2017-03-09: 100 mg via ORAL
  Filled 2017-03-06 (×8): qty 10

## 2017-03-06 MED ORDER — PIPERACILLIN-TAZOBACTAM 3.375 G IVPB
3.3750 g | Freq: Two times a day (BID) | INTRAVENOUS | Status: DC
  Administered 2017-03-07 – 2017-03-09 (×4): 3.375 g via INTRAVENOUS
  Filled 2017-03-06 (×6): qty 50

## 2017-03-06 NOTE — Progress Notes (Signed)
BP's and MAPs remain the same.  Pt condition unchanged from when RR RN at bedside.  Pt SBP remain 80-s-90's with MAPs >60 and HR ST 110's. RR RN updated per request.   RR RN to speak to [facility] per conversation.

## 2017-03-06 NOTE — Progress Notes (Signed)
Lab called to report critical lab of Lactic Acid of 2.9. MD Quincy Simmonds) page notified.

## 2017-03-06 NOTE — Progress Notes (Addendum)
Pharmacy Antibiotic Note  [name] is a 68 y.o. male  with possible sepsis.  Pharmacy has been consulted for vancomycin and zosyn dosing. He is noted with ESRD on HD MWF. Plans for HD 1/26 (completed ~ 4hrs at [facility]) and 1/27 -WBC= 17.4, afeb, LA= 2.9  Plan: -Vancomycin 1750mg  IV z1 followed by 750mg  IV on 1/27 then 750mg  IV MWF -Zosyn 3.375mg  IV q12h -Will follow  cultures and clinical progress   Weight: 167 lb 15.9 oz (76.2 kg)  Temp (24hrs), Avg:98.4 F (36.9 C), Min:96.4 F (35.8 C), Max:99.8 F (37.7 C)  Recent Labs  Lab 03/02/17 2043 03/02/17 2044 03/03/17 1745 03/04/17 2112 03/05/17 0627 03/06/17 1050  WBC 13.9*  --  14.0* 16.4* 18.3* 17.4*  CREATININE  --  18.22* 17.35* 13.16*  --  12.19*  LATICACIDVEN  --   --   --   --   --  2.9*    Estimated Creatinine Clearance: 6.3 mL/min (A) (by C-G formula based on SCr of 12.19 mg/dL (H)).    Allergies  Allergen Reactions  . Pravastatin Other (See Comments)    MYALGIAS, MUSCLE PAIN  . Simvastatin Other (See Comments)    MYALGIAS, MUSCLE PAIN     Thank you for allowing pharmacy to be a part of this patient's care.  Hildred Laser, Pharm D 03/06/2017 5:03 PM

## 2017-03-06 NOTE — Progress Notes (Signed)
Pt agreeable to allow staff to stick for AM labs.  Phlebotomy stuck patient for labs but unable to obtain labs.  Lab to send personnel to attempt a second time.
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
## 2017-03-06 NOTE — Progress Notes (Signed)
PROGRESS NOTE [facility]   [name]   SWF:[id number] DOB: 22-Oct-1950  DOA: 03/05/2017 PCP: [facility]   Brief Narrative:  [name] 67 year old male with medical history of end-stage renal disease, COPD, tobacco abuse who was sent from CIR due to encephalopathy.  Patient was recently discharged after being admitted for left fibular fracture, went to rehabilitation subsequently was readmitted on February 19, 2017 for elevated blood pressure at the time he was altered and was missing dialysis for which he required intubation for airway support.  Subsequently patient improved and went to inpatient rehabilitation.  Patient has improved very little, but has been declining for a few several days.  There is concern for uremia and patient as patient has not been dialyzed on schedule.  Patient was transferred to inpatient care for further evaluation.  Patient blood pressure has been trending down with low temperatures and elevated white count.  There is concern for occult infection/sepsis therefore patient was started on IV antibiotics.  CT of the head unremarkable for acute abnormalities.   Subjective: Patient seen and examined, he is encephalopathic, not following commands and non verbal.   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 On previous admission patient had hypertensive encephalopathy, with improving blood pressure but mental status has not getting any better.  Questionable if some permanent brain damage.  Although patient is very uremic which could be the reason why he is so encephalopathic.  There is also a possibility of infectious process going on as patient white count is elevated and blood pressure is low with elevated lactic acid. CT of the head was ordered and is unremarkable for acute abnormalities. We will get blood cultures, urine  culture.  Start antibiotics after blood cultures are collected Chest x-ray is negative for acute process We will continue dialysis for the next 2-day if no improvement will consider consult neurology Avoid sedative agents.  End-stage renal disease Continue hemodialysis per nephrology  Hypotension Unclear etiology at this time suspect infection Start Zosyn and vancomycin Started on IV fluid Follow blood culture  Type 2 diabetes mellitus CBGs stable  COPD No signs of COPD exacerbation at this time, chest x-ray with no acute abnormality Continue to monitor  Failure to thrive  I suspect this is due to encephalopathy, will see if improved in the next 48 hours. Palliative care has been consulted. Continue to monitor for now    DVT prophylaxis: Heparin sq  Code Status: Full Code  Family Communication: None at bedside  Disposition Plan: TBD   Consultants:   Nephrology   Procedures:   HD  Antimicrobials:  Zosyn 03/06/17  Vanc 03/06/17   Objective: Vitals:   03/06/17 1423 03/06/17 1450 03/06/17 1515 03/06/17 1545  BP: 111/64 108/78 121/73 (!) 135/96  Pulse: 87 78 80 82  Resp: 18 (!) 21 (!) 22 (!) 21  Temp:      TempSrc:      SpO2:      Weight:        Intake/Output Summary (Last 24 hours) at 03/06/2017 1643 Last data filed at 03/06/2017 1400 Gross per 24 hour  Intake 0 ml  Output 0 ml  Net 0 ml   Filed Weights   03/05/17 1700 03/06/17 0500 03/06/17 1415  Weight: 58 kg (127 lb 13.9 oz) 76.6 kg (168 lb 14 oz) 76.2 kg (167 lb 15.9 oz)    Examination:  General exam: NAD HEENT:  AC/AT, PERRLA Respiratory system: Clear to auscultation. No wheezes,crackle or rhonchi Cardiovascular system: S1 & S2 heard, RRR. No JVD, murmurs, rubs or gallops Gastrointestinal system: Abdomen is nondistended, soft and nontender.  Central nervous system: Encephalopathic, non verbal, open eyes on command. Extremities: No LE edema  Skin: No rashes  Data Reviewed: I have personally  reviewed following labs and imaging studies  CBC: Recent Labs  Lab 03/02/17 2043 03/03/17 1745 03/04/17 2112 03/05/17 0627 03/06/17 1050  WBC 13.9* 14.0* 16.4* 18.3* 17.4*  HGB 12.8* 12.5* 12.8* 14.7 14.4  HCT 39.0 37.8* 39.2 43.5 44.0  MCV 102.4* 101.9* 102.6* 102.6* 104.5*  PLT 446* 410* 322 317 626   Basic Metabolic Panel: Recent Labs  Lab 03/02/17 2044 03/03/17 1745 03/04/17 2112 03/06/17 1050  NA 142 140 139 139  K 4.5 4.3 4.6 5.2*  CL 96* 97* 97* 96*  CO2 21* 22 21* 21*  GLUCOSE 199* 183* 136* 156*  BUN 117* 124* 71* 59*  CREATININE 18.22* 17.35* 13.16* 12.19*  CALCIUM 9.9 9.9 9.9 10.1  PHOS 7.2* 7.1* 7.3*  --    GFR: Estimated Creatinine Clearance: 6.3 mL/min (A) (by C-G formula based on SCr of 12.19 mg/dL (H)). Liver Function Tests: Recent Labs  Lab 03/02/17 2044 03/03/17 1745 03/04/17 2112 03/06/17 1050  AST  --   --   --  54*  ALT  --   --   --  39  ALKPHOS  --   --   --  53  BILITOT  --   --   --  0.9  PROT  --   --   --  7.5  ALBUMIN 3.5 3.5 3.7 4.0   No results for input(s): LIPASE, AMYLASE in the last 168 hours. No results for input(s): AMMONIA in the last 168 hours. Coagulation Profile: No results for input(s): INR, PROTIME in the last 168 hours. Cardiac Enzymes: No results for input(s): CKTOTAL, CKMB, CKMBINDEX, TROPONINI in the last 168 hours. BNP (last 3 results) No results for input(s): PROBNP in the last 8760 hours. HbA1C: Recent Labs    03/05/17 0627  HGBA1C 5.3   CBG: Recent Labs  Lab 03/05/17 1136 03/05/17 1654 03/05/17 2152 03/06/17 0615 03/06/17 1200  GLUCAP 119* 110* 139* 141* 142*   Lipid Profile: No results for input(s): CHOL, HDL, LDLCALC, TRIG, CHOLHDL, LDLDIRECT in the last 72 hours. Thyroid Function Tests: No results for input(s): TSH, T4TOTAL, FREET4, T3FREE, THYROIDAB in the last 72 hours. Anemia Panel: No results for input(s): VITAMINB12, FOLATE, FERRITIN, TIBC, IRON, RETICCTPCT in the last 72  hours. Sepsis Labs: Recent Labs  Lab 03/06/17 1050  LATICACIDVEN 2.9*    Recent Results (from the past 240 hour(s))  Culture, Urine     Status: None   Collection Time: 03/02/17  1:00 PM  Result Value Ref Range Status   Specimen Description URINE, CLEAN CATCH  Final   Special Requests NONE  Final   Culture NO GROWTH  Final   Report Status 03/03/2017 FINAL  Final      Radiology Studies: Ct Head Wo Contrast  Result Date: 03/06/2017 CLINICAL DATA:  Altered mental status. EXAM: CT HEAD WITHOUT CONTRAST TECHNIQUE: Contiguous axial images were obtained from the base of the skull through the vertex without intravenous contrast. COMPARISON:  02/19/2017 CT 01/21/2017 MRI FINDINGS: Brain: Motion degraded exam. Generalized atrophy with chronic small-vessel ischemic changes throughout the cerebral hemispheric white matter. No sign of acute infarction, mass lesion, hemorrhage, hydrocephalus or extra-axial collection. Vascular: No abnormal  vascular finding other than chronic atherosclerotic calcification. Skull: Negative Sinuses/Orbits: Clear/normal Other: None IMPRESSION: Motion degraded study. No acute finding. Atrophy and chronic small-vessel ischemic changes as seen previously. Electronically Signed   By: Nelson Chimes M.D.   On: 03/06/2017 11:52   Portable Chest 1 View  Result Date: 03/06/2017 CLINICAL DATA:  Shortness of breath EXAM: PORTABLE CHEST 1 VIEW COMPARISON:  March 02, 2017 FINDINGS: Stable central line/catheter. The heart, hila, mediastinum, lungs, and pleura are otherwise normal. IMPRESSION: No active disease. Electronically Signed   By: Dorise Bullion III M.D   On: 03/06/2017 07:49      Scheduled Meds: . atorvastatin  20 mg Oral q1800  . cholecalciferol  1,000 Units Oral Daily  . docusate  100 mg Oral BID  . feeding supplement (NEPRO CARB STEADY)  237 mL Oral BID BM  . fluconazole  100 mg Oral Daily  . folic acid  1 mg Oral Daily  . heparin  5,000 Units Subcutaneous Q8H  .  heparin  8,000 Units Dialysis Once in dialysis  . [START ON 03/07/2017] heparin  8,600 Units Dialysis Once in dialysis  . insulin aspart  0-9 Units Subcutaneous TID WC  . [START ON 03/07/2017] pantoprazole sodium  40 mg Per Tube Daily  . sevelamer carbonate  2.4 g Oral TID WC   Continuous Infusions: . sodium chloride    . sodium chloride    . sodium chloride       LOS: 1 day    Time spent: Total of 25 minutes spent with pt, greater than 50% of which was spent in discussion of  treatment, counseling and coordination of care   Chipper Oman, MD Pager: Text Page via [email or web address]   If 7PM-7AM, please contact night-coverage [email or web address] 03/06/2017, 4:43 PM

## 2017-03-06 NOTE — Progress Notes (Signed)
Pt continues with SBP 80-90's with MAPs maintaining >60 per ordered parameters.  HR remains elevated 100-120's sinus tachycardia with PAC's.  Occasional HR noted to be 130's non-sustained.  Pt. currently alert and compliant with simple commands at this time but intermittently agitated prior to this.  Review of chart noted continued increase in [facility] daily.  Jeannette Corpus, NP notified with concern for SIRS given decreased SBP, tachycardia, and increasing WBC's.  No additional orders received from provider.  Rapid response nurse notified of concerns and to bedside to evaluate.

## 2017-03-06 NOTE — Significant Event (Addendum)
Rapid Response Event Note  Call Time 0046 Arrival Time 0100  Overview: [place]. Concern for SIRS Per RN, BP has been soft earlier on day shift SBP 80-90s, MAPs >60, HR elevated 100-120s sinus with PACs. Rectal Temp of 99.8  Initial Focused Assessment: Patient was alert when I arrived, he stated he felt fine and he had a good day, has been altered/agitated and/ or encephalopathic at times per chart. Lung/heart sounds are normal, + bowel sounds, skin is warm and dry, + pulses. Not in acute distress. Patient has getting HD and was readmitted from CIR for medical management of mental status and rising creatinine.   Interventions: -- Manual BP done 94/64 and Auto Cuff 94/53   Plan of Care (if not transferred): -- RN to page primary service to update them. -- Call RRT if needed. -- WBC are elevated as well, per RN [facility] provider was updated with that info -- Repeat VS in hour and will re-evaluate patient then. Repeat VS are the same, I paged [facility] NP at 240, orders received for lactic acid. I called the 3W RN back and informed her of the order.  Event Summary:   at   End Time [place], [place]

## 2017-03-06 NOTE — Progress Notes (Signed)
[facility] Progress Note   Dialysis Orders: MWF [facility] 4h 55min 2/2 bath 400/80085kg Hep 8600 R AVF -Calcitriol 2.0 mcg PO TIW -Sensipar 120 mg PO TIW -Venofer 50 mg IV weekly -Mircera 60 mcg IV q 2 weeks (Last dose 02/12/17)   Assessment: 1  AMS - uremia, ?PRES, should be improving but needs more HD. Plan HD today and tomorrow, get solute down.  Also low BP's may dec MS in pts used to higher pressures.  2  HTN - bp's LOW now, BP meds dc'd, needs IVF's , not eating prob dry 3  [facility] MWF HD, using TDC. VVS 1/14 said use AVF, not sure if we have 4  Anemia of CKD - Hb up, hemoconcentrated 5  MBD of CKD - cont meds 6  Fevers, ^wbc - low grade temps, per primary 6  COPD  7  DM 8  Ankle fx  P - HD today and extra HD tomorrow, get solute down, starting IVF's  Kelly Splinter MD [employer] pgr [phone]   03/06/2017, 11:04 AM     Subjective:   Not answering questions   Objective Vitals:   03/06/17 0222 03/06/17 0452 03/06/17 0500 03/06/17 0928  BP: (!) 94/53 (!) 92/58  (!) 99/54  Pulse: (!) 110 95  96  Resp:  18  17  Temp:  98.3 F (36.8 C)    TempSrc:  Oral    SpO2:  98%    Weight:   76.6 kg (168 lb 14 oz)    Physical Exam General: eyes closed - not interactive Heart: tachy Lungs: CTA Abdomen: soft NT Extremities: LLE in short cast - right LE no edema Dialysis Access: right IJ [facility]   Additional Objective Labs: Basic Metabolic Panel: Recent Labs  Lab 03/02/17 2044 03/03/17 1745 03/04/17 2112  NA 142 140 139  K 4.5 4.3 4.6  CL 96* 97* 97*  CO2 21* 22 21*  GLUCOSE 199* 183* 136*  BUN 117* 124* 71*  CREATININE 18.22* 17.35* 13.16*  CALCIUM 9.9 9.9 9.9  PHOS 7.2* 7.1* 7.3*   Liver Function Tests: Recent Labs  Lab 03/02/17 2044 03/03/17 1745 03/04/17 2112  ALBUMIN 3.5 3.5 3.7   CBC: Recent Labs  Lab 03/02/17 2043 03/03/17 1745 03/04/17 2112 03/05/17 0627  WBC 13.9* 14.0* 16.4* 18.3*  HGB 12.8* 12.5*  12.8* 14.7  HCT 39.0 37.8* 39.2 43.5  MCV 102.4* 101.9* 102.6* 102.6*  PLT 446* 410* 322 317   Blood Culture    Component Value Date/Time   SDES URINE, CLEAN CATCH 03/02/2017 1300   SPECREQUEST NONE 03/02/2017 1300   CULT NO GROWTH 03/02/2017 1300   REPTSTATUS 03/03/2017 FINAL 03/02/2017 1300    Cardiac Enzymes: No results for input(s): CKTOTAL, CKMB, CKMBINDEX, TROPONINI in the last 168 hours. CBG: Recent Labs  Lab 03/05/17 0705 03/05/17 1136 03/05/17 1654 03/05/17 2152 03/06/17 0615  GLUCAP 100* 119* 110* 139* 141*   Iron Studies: No results for input(s): IRON, TIBC, TRANSFERRIN, FERRITIN in the last 72 hours. Lab Results  Component Value Date   INR 0.99 02/19/2017   INR 1.7 (H) 09/11/2007   INR 1.5 09/10/2007   Studies/Results: Portable Chest 1 View  Result Date: 03/06/2017 CLINICAL DATA:  Shortness of breath EXAM: PORTABLE CHEST 1 VIEW COMPARISON:  March 02, 2017 FINDINGS: Stable central line/catheter. The heart, hila, mediastinum, lungs, and pleura are otherwise normal. IMPRESSION: No active disease. Electronically Signed   By: Dorise Bullion III M.D   On: 03/06/2017 07:49  Medications: . sodium chloride    . sodium chloride     . atorvastatin  20 mg Oral q1800  . cholecalciferol  1,000 Units Oral Daily  . docusate sodium  100 mg Oral BID  . feeding supplement (NEPRO CARB STEADY)  237 mL Oral BID BM  . fluconazole  100 mg Oral Daily  . folic acid  1 mg Oral Daily  . heparin  5,000 Units Subcutaneous Q8H  . heparin  8,000 Units Dialysis Once in dialysis  . insulin aspart  0-9 Units Subcutaneous TID WC  . pantoprazole  40 mg Oral Daily  . sevelamer carbonate  2.4 g Oral TID WC

## 2017-03-07 DIAGNOSIS — Z992 Dependence on renal dialysis: Secondary | ICD-10-CM

## 2017-03-07 DIAGNOSIS — Z7189 Other specified counseling: Secondary | ICD-10-CM

## 2017-03-07 DIAGNOSIS — N189 Chronic kidney disease, unspecified: Secondary | ICD-10-CM

## 2017-03-07 DIAGNOSIS — Z515 Encounter for palliative care: Secondary | ICD-10-CM

## 2017-03-07 DIAGNOSIS — N179 Acute kidney failure, unspecified: Secondary | ICD-10-CM

## 2017-03-07 DIAGNOSIS — I674 Hypertensive encephalopathy: Secondary | ICD-10-CM

## 2017-03-07 DIAGNOSIS — R627 Adult failure to thrive: Secondary | ICD-10-CM

## 2017-03-07 LAB — CBC WITH DIFFERENTIAL/PLATELET
Basophils Absolute: 0.1 10*3/uL (ref 0.0–0.1)
Basophils Relative: 0 %
EOS ABS: 0.2 10*3/uL (ref 0.0–0.7)
EOS PCT: 1 %
HCT: 36.6 % — ABNORMAL LOW (ref 39.0–52.0)
Hemoglobin: 12.1 g/dL — ABNORMAL LOW (ref 13.0–17.0)
LYMPHS ABS: 1.3 10*3/uL (ref 0.7–4.0)
Lymphocytes Relative: 8 %
MCH: 34.2 pg — AB (ref 26.0–34.0)
MCHC: 33.1 g/dL (ref 30.0–36.0)
MCV: 103.4 fL — ABNORMAL HIGH (ref 78.0–100.0)
MONO ABS: 1.4 10*3/uL — AB (ref 0.1–1.0)
Monocytes Relative: 8 %
Neutro Abs: 13.2 10*3/uL — ABNORMAL HIGH (ref 1.7–7.7)
Neutrophils Relative %: 83 %
Platelets: 290 10*3/uL (ref 150–400)
RBC: 3.54 MIL/uL — ABNORMAL LOW (ref 4.22–5.81)
RDW: 15.1 % (ref 11.5–15.5)
WBC: 16.1 10*3/uL — ABNORMAL HIGH (ref 4.0–10.5)

## 2017-03-07 LAB — GLUCOSE, CAPILLARY
GLUCOSE-CAPILLARY: 80 mg/dL (ref 65–99)
Glucose-Capillary: 92 mg/dL (ref 65–99)
Glucose-Capillary: 106 mg/dL — ABNORMAL HIGH (ref 65–99)

## 2017-03-07 MED ORDER — BACLOFEN 10 MG PO TABS
10.0000 mg | ORAL_TABLET | Freq: Once | ORAL | Status: AC
  Administered 2017-03-07: 10 mg via ORAL
  Filled 2017-03-07: qty 1

## 2017-03-07 NOTE — Progress Notes (Signed)
[facility] Progress Note  Background: transferred back to inpatient unit 1/25 due encephalopathy  Dialysis Orders: MWF [facility] 4h 59min 2/2 bath 400/80085kg Hep 8600 R AVF and right IJ -Calcitriol 2.0 mcg PO TIW -Sensipar 120 mg PO TIW -Venofer 50 mg IV weekly -Mircera 60 mcg IV q 2 weeks (Last dose 02/12/17)   Assessment/Plan: 1.Encephalopathy- uremic +/- othe ? PRES with relative BP drop. S/p cath exchange but still some catheter flow problems - for extra treatment today MS hasn't improved with HD yet; Given baclofen 10 mg this am - this is contraindicated in ESRD patients- I will list as an intolerance Seems to be improving w good dialysis. Plan HD today and again tomorrow.   2. ESRD- HD MWF  No HD Friday- had Saturday and just getting on today.  Hold on HD for Monday and reassess in the am Access  Issues right upper AVF: duplex showed severe narrowing at outflow vein prox upper arm - 02/23/17 - AVF not audible- order f'gram possible declot; Didn't get IVF as ordered yesterday so giving 500 cc bolus on HD to start and then 500 over the remainder of treatment; prn restraints during HD treatment Plan HD again Monday, keep on schedule, needs solute down further.  3. Anemiaof CKD-Hgb high 12 >14 - on weekly Fe; stop Aranesp for now- suspect hemoconcentration 4. Secondary hyperparathyroidism- Ca up to 10.1 - resumed sensipar 1/25 - hold on resuming calcitriol until P comes down;  continue binders.- has been taking binders for the most part - hold on increasing for now - even though P elevated 5.HTN/volume- BPlow, net UF 1 L 1/23 and again when he finished earlier today; CXR 1/22 - neg kept even 1/26 - still dry think - weights significantly below edw if accurate- eating little. IVF ordered yesterday- not given; have discussed with nursing to resume 6. COPD - per primary 7. DM - per primary 8. Ankle frx - per primary left LE cast 9. Deconditioning  10.  Tachycardia /leukocytosis - HR ^ new leukocytosis; hemoconcentration suggests otherwise- work up per Hospitalists when admitted -[place] UTI 1/12- next 1/22 ; last BC drawn 1/11 CXR neg 1/26 11. MRSA precautions 12. Nutrition - intake marginal/suppl ordered - on D1 diet- honey thick changed to reg renal carb mod -eating very little  Myriam Jacobson, PA-C [place] [phone] 03/07/2017,9:27 AM  LOS: 2 days   Pt seen, examined, agree w assess/plan as above with additions as indicated.  Kelly Splinter MD [facility] pager [phone]    cell [phone] 03/07/2017, 1:36 PM      Subjective:   noncommunicative  Objective Vitals:   03/06/17 1815 03/06/17 1830 03/06/17 2214 03/07/17 0644  BP: 97/87 123/74 (!) 124/55 96/76  Pulse: (!) 118 90    Resp: (!) 22 (!) 21 20   Temp:  97.9 F (36.6 C) 98.9 F (37.2 C)   TempSrc:  Oral Oral   SpO2:  98%    Weight:  76.4 kg (168 lb 6.9 oz) 77.3 kg (170 lb 6.7 oz)    Physical Exam General: restless in bed - rolling from side to side Heart: tachy reg ~100 Lungs: no rales appreciated Abdomen: soft NT Extremities: no LE edema  LLE in short cast hands in mittens Dialysis Access: right IJ TDC and right upper AVF no bruit appreciated - difficult to get patient to hold still for exam   Additional Objective Labs: Basic Metabolic Panel: Recent Labs  Lab 03/02/17 2044  03/03/17 1745 03/04/17 2112 03/06/17 1050  NA 142 140 139 139  K 4.5 4.3 4.6 5.2*  CL 96* 97* 97* 96*  CO2 21* 22 21* 21*  GLUCOSE 199* 183* 136* 156*  BUN 117* 124* 71* 59*  CREATININE 18.22* 17.35* 13.16* 12.19*  CALCIUM 9.9 9.9 9.9 10.1  PHOS 7.2* 7.1* 7.3*  --    Liver Function Tests: Recent Labs  Lab 03/03/17 1745 03/04/17 2112 03/06/17 1050  AST  --   --  54*  ALT  --   --  39  ALKPHOS  --   --  53  BILITOT  --   --  0.9  PROT  --   --  7.5  ALBUMIN 3.5 3.7 4.0   No results for input(s): LIPASE, AMYLASE in the last 168  hours. CBC: Recent Labs  Lab 03/02/17 2043 03/03/17 1745 03/04/17 2112 03/05/17 0627 03/06/17 1050  WBC 13.9* 14.0* 16.4* 18.3* 17.4*  HGB 12.8* 12.5* 12.8* 14.7 14.4  HCT 39.0 37.8* 39.2 43.5 44.0  MCV 102.4* 101.9* 102.6* 102.6* 104.5*  PLT 446* 410* 322 317 343   Blood Culture    Component Value Date/Time   SDES BLOOD HEMODIALYSIS CATHETER 03/06/2017 1715   SPECREQUEST  03/06/2017 1715    BOTTLES DRAWN AEROBIC AND ANAEROBIC Blood Culture results may not be optimal due to an excessive volume of blood received in culture bottles   CULT PENDING 03/06/2017 1715   REPTSTATUS PENDING 03/06/2017 1715    Cardiac Enzymes: No results for input(s): CKTOTAL, CKMB, CKMBINDEX, TROPONINI in the last 168 hours. CBG: Recent Labs  Lab 03/05/17 1654 03/05/17 2152 03/06/17 0615 03/06/17 1200 03/07/17 0757  GLUCAP 110* 139* 141* 142* 92   Iron Studies: No results for input(s): IRON, TIBC, TRANSFERRIN, FERRITIN in the last 72 hours. Lab Results  Component Value Date   INR 0.99 02/19/2017   INR 1.7 (H) 09/11/2007   INR 1.5 09/10/2007   Studies/Results: Ct Head Wo Contrast  Result Date: 03/06/2017 CLINICAL DATA:  Altered mental status. EXAM: CT HEAD WITHOUT CONTRAST TECHNIQUE: Contiguous axial images were obtained from the base of the skull through the vertex without intravenous contrast. COMPARISON:  02/19/2017 CT 01/21/2017 MRI FINDINGS: Brain: Motion degraded exam. Generalized atrophy with chronic small-vessel ischemic changes throughout the cerebral hemispheric white matter. No sign of acute infarction, mass lesion, hemorrhage, hydrocephalus or extra-axial collection. Vascular: No abnormal vascular finding other than chronic atherosclerotic calcification. Skull: Negative Sinuses/Orbits: Clear/normal Other: None IMPRESSION: Motion degraded study. No acute finding. Atrophy and chronic small-vessel ischemic changes as seen previously. Electronically Signed   By: Nelson Chimes M.D.   On:  03/06/2017 11:52   Portable Chest 1 View  Result Date: 03/06/2017 CLINICAL DATA:  Shortness of breath EXAM: PORTABLE CHEST 1 VIEW COMPARISON:  March 02, 2017 FINDINGS: Stable central line/catheter. The heart, hila, mediastinum, lungs, and pleura are otherwise normal. IMPRESSION: No active disease. Electronically Signed   By: Dorise Bullion III M.D   On: 03/06/2017 07:49   Medications: . sodium chloride    . sodium chloride    . sodium chloride    . piperacillin-tazobactam    . piperacillin-tazobactam (ZOSYN)  IV    . vancomycin    . [START ON 03/08/2017] vancomycin     . atorvastatin  20 mg Oral q1800  . cholecalciferol  1,000 Units Oral Daily  . docusate  100 mg Oral BID  . feeding supplement (NEPRO CARB STEADY)  237 mL Oral BID BM  .  fluconazole  100 mg Oral Daily  . folic acid  1 mg Oral Daily  . heparin  5,000 Units Subcutaneous Q8H  . heparin  8,000 Units Dialysis Once in dialysis  . heparin  8,600 Units Dialysis Once in dialysis  . insulin aspart  0-9 Units Subcutaneous TID WC  . pantoprazole sodium  40 mg Per Tube Daily  . sevelamer carbonate  2.4 g Oral TID WC

## 2017-03-07 NOTE — Consult Note (Signed)
Consultation Note Date: 03/07/2017   Patient Name: [name]  DOB: 1950/06/15  MRN: [id number]  Age / Sex: 67 y.o., male  [facility] Referring Physician: Patrecia Pour, Christean Grief, MD  Reason for Consultation: Establishing goals of care  HPI/Patient Profile: 67 y.o. male  with past medical history of ESRD on HD, DM, h/o recent L. Fibular fracture, dysphagia, COPD, HTN, HLD, h/o prostate cancer s/p prostatectomy. Patient has several recent hospitalizations including 01/20/17 to 02/03/17 for AMS thought related to ?OSA and PRES. He was discharged to inpatient rehab where he slowly improved and was discharged home on 02/16/17. Patient was again hospitalized 02/19/17 to 02/26/17 for AMS related to hypertensive encephalopathy and acute respiratory failure requiring intubation. Patient was eventually extubated and was again discharged to inpatient rehab, where he continued to have increasing confusion and weakness and was suspected to have uremia and probable lasting effects from encephalopathy. Patient was readmitted on 03/05/2017 with same. His hospitalization has been complicated by agitation, and confusion. He is being dialyzed daily and treated for sepsis.   Clinical Assessment and Goals of Care: Patient is unable to participate in conversations regarding goals and does not currently have appear to have capacity for decision making. I tried calling his daughter, [name] at [phone], but did not reach her. Will continue to try to reach family to arrange a meeting.    SUMMARY OF RECOMMENDATIONS   1. Will continue to try to reach family to arrange goals of care meeting.  Code Status/Advance Care Planning:  Full code    Symptom Management:   Agitation - agree with prn haldol   Discharge Planning: To Be Determined      Primary Diagnoses: Present on Admission: . Acute on chronic renal failure  ([place])   I have reviewed the medical record, interviewed the patient and family, and examined the patient. The following aspects are pertinent.  Past Medical History:  Diagnosis Date  . Arthritis    right hand  . Cancer [facility])    prostate  . Chronic kidney disease    Dialysis M/w/F, [facility]  . COPD (chronic obstructive pulmonary disease) ([place])   . Diabetes mellitus    Type 2   . GERD (gastroesophageal reflux disease)   . High cholesterol   . Hypertension    Social History   Socioeconomic History  . Marital status: Legally Separated    Spouse name: Not on file  . Number of children: Not on file  . Years of education: Not on file  . Highest education level: Not on file  Social Needs  . Financial resource strain: Not on file  . Food insecurity - worry: Not on file  . Food insecurity - inability: Not on file  . Transportation needs - medical: Not on file  . Transportation needs - non-medical: Not on file  Occupational History  . Not on file  Tobacco Use  . Smoking status: Current Some Day Smoker    Packs/day: 0.25    Years: 52.00  Pack years: 13.00    Types: Cigarettes  . Smokeless tobacco: Never Used  Substance and Sexual Activity  . Alcohol use: No  . Drug use: No  . Sexual activity: No  Other Topics Concern  . Not on file  Social History Narrative  . Not on file   Family History  Problem Relation Age of Onset  . Heart failure Mother   . CAD Father   . Prostate cancer Neg Hx   . Kidney cancer Neg Hx    Scheduled Meds: . atorvastatin  20 mg Oral q1800  . cholecalciferol  1,000 Units Oral Daily  . docusate  100 mg Oral BID  . feeding supplement (NEPRO CARB STEADY)  237 mL Oral BID BM  . fluconazole  100 mg Oral Daily  . folic acid  1 mg Oral Daily  . heparin  5,000 Units Subcutaneous Q8H  . heparin  8,000 Units Dialysis Once in dialysis  . heparin  8,600 Units Dialysis Once in dialysis  . insulin aspart  0-9 Units Subcutaneous TID WC  .  pantoprazole sodium  40 mg Per Tube Daily  . sevelamer carbonate  2.4 g Oral TID WC   Continuous Infusions: . sodium chloride    . sodium chloride    . sodium chloride    . piperacillin-tazobactam    . piperacillin-tazobactam (ZOSYN)  IV    . vancomycin    . [START ON 03/08/2017] vancomycin     PRN Meds:.sodium chloride, sodium chloride, albuterol, alteplase, food thickener, haloperidol, haloperidol lactate, heparin, heparin, HYDROcodone-acetaminophen, lidocaine (PF), lidocaine-prilocaine, pentafluoroprop-tetrafluoroeth Medications Prior to Admission:  Prior to Admission medications   Medication Sig Start Date End Date Taking? Authorizing Provider  acetaminophen (TYLENOL) 500 MG tablet Take 1 tablet (500 mg total) by mouth every 6 (six) hours as needed for mild pain or moderate pain. 01/19/17  Yes Waynetta Pean, PA-C  albuterol (PROVENTIL HFA;VENTOLIN HFA) 108 (90 Base) MCG/ACT inhaler Inhale 1-2 puffs into the lungs every 6 (six) hours as needed for wheezing. 03/05/16  Yes McKeag, Marylynn Pearson, MD  amLODipine (NORVASC) 10 MG tablet Take 1 tablet (10 mg total) by mouth daily. 02/16/17  Yes Angiulli, Lavon Paganini, PA-C  atorvastatin (LIPITOR) 20 MG tablet Take 1 tablet (20 mg total) by mouth daily at 6 PM. 02/16/17  Yes Angiulli, Lavon Paganini, PA-C  cholecalciferol (VITAMIN D) 1000 units tablet Take 1,000 Units by mouth daily.   Yes [provider]  cinacalcet (SENSIPAR) 30 MG tablet 120 mg, Oral, Custom (Once per day on Mon Wed Fri), Patient taking differently: Take 120 mg by mouth every Monday, Wednesday, and Friday. 120 mg, Oral, Custom (Once per day on Mon Wed Fri), 02/16/17  Yes Angiulli, Lavon Paganini, PA-C  docusate sodium (COLACE) 100 MG capsule Take 1 capsule (100 mg total) by mouth 2 (two) times daily. 02/26/17  Yes Rosita Fire, MD  folic acid (FOLVITE) 1 MG tablet Take 1 tablet (1 mg total) by mouth daily. 02/16/17  Yes Angiulli, Lavon Paganini, PA-C  haloperidol (HALDOL) 2 MG tablet Take 1 tablet  (2 mg total) by mouth every 8 (eight) hours as needed for agitation. 02/26/17  Yes Rosita Fire, MD  hydrALAZINE (APRESOLINE) 25 MG tablet Take 1 tablet (25 mg total) by mouth every 8 (eight) hours. 02/16/17  Yes Angiulli, Lavon Paganini, PA-C  HYDROcodone-acetaminophen (NORCO/VICODIN) 5-325 MG tablet Take 1 tablet by mouth every 6 (six) hours as needed for moderate pain.   Yes [provider]  Nutritional  Supplements (FEEDING SUPPLEMENT, NEPRO CARB STEADY,) LIQD Take 237 mLs by mouth 2 (two) times daily between meals. 02/26/17  Yes Rosita Fire, MD  pantoprazole (PROTONIX) 40 MG tablet Take 1 tablet (40 mg total) by mouth daily. 02/16/17  Yes Angiulli, Lavon Paganini, PA-C  sevelamer carbonate (RENVELA) 2.4 g PACK Take 2.4 g by mouth 3 (three) times daily with meals. 02/26/17  Yes Rosita Fire, MD  tamsulosin (FLOMAX) 0.4 MG CAPS capsule Take 1 capsule (0.4 mg total) by mouth daily. 02/16/17  Yes Angiulli, Lavon Paganini, PA-C   Allergies  Allergen Reactions  . Pravastatin Other (See Comments)    MYALGIAS, MUSCLE PAIN  . Simvastatin Other (See Comments)    MYALGIAS, MUSCLE PAIN   Review of Systems  Unable to perform ROS   Physical Exam  Constitutional: No distress.  Cardiovascular: Normal rate.  Pulmonary/Chest: Effort normal.  Abdominal: Soft.  Neurological: He is alert.  Did not speak, did not follow commands  Skin: Skin is warm and dry.    Vital Signs: BP 96/76 (BP Location: Left Arm)   Pulse 90   Temp 98.9 F (37.2 C) (Oral)   Resp 20   Wt 77.3 kg (170 lb 6.7 oz)   SpO2 98%   BMI 22.48 kg/m  Pain Assessment: PAINAD   Pain Score: Asleep   SpO2: SpO2: 98 % O2 Device:SpO2: 98 % O2 Flow Rate: .   IO: Intake/output summary:   Intake/Output Summary (Last 24 hours) at 03/07/2017 0951 Last data filed at 03/07/2017 0600 Gross per 24 hour  Intake 120 ml  Output 0 ml  Net 120 ml    LBM: Last BM Date: 03/06/17 Baseline Weight: Weight: 58 kg (127 lb 13.9  oz) Most recent weight: Weight: 77.3 kg (170 lb 6.7 oz)     Palliative Assessment/Data:   Flowsheet Rows     Most Recent Value  Intake Tab  Referral Department  Hospitalist  Unit at Time of Referral  Med/Surg Unit  Palliative Care Primary Diagnosis  Nephrology  Date Notified  03/05/17  Palliative Care Type  New Palliative care  Reason for referral  Clarify Goals of Care  Date of Admission  03/05/17  Date first seen by Palliative Care  03/07/17  # of days Palliative referral response time  2 Day(s)  # of days IP prior to Palliative referral  0  Clinical Assessment  Palliative Performance Scale Score  20%  Pain Max last 24 hours  Not able to report  Pain Min Last 24 hours  Not able to report  Dyspnea Max Last 24 Hours  Not able to report  Dyspnea Min Last 24 hours  Not able to report  Nausea Max Last 24 Hours  Not able to report  Nausea Min Last 24 Hours  Not able to report  Anxiety Max Last 24 Hours  Not able to report  Anxiety Min Last 24 Hours  Not able to report  Other Max Last 24 Hours  Not able to report  Psychosocial & Spiritual Assessment  Palliative Care Outcomes  Patient/Family meeting held?  No      Time In: 0930 Time Out: 1000 Time Total: 30 Greater than 50%  of this time was spent counseling and coordinating care related to the above assessment and plan.  Signed by: Irean Hong, NP   Please contact Palliative Medicine Team phone at [phone] for questions and concerns.  For individual provider: See Shea Evans

## 2017-03-07 NOTE — Progress Notes (Signed)
PROGRESS NOTE [facility]   [name]   ESP:[id number] DOB: 06-30-1950  DOA: 03/05/2017 PCP: [facility]   Brief Narrative:  [name] 67 year old male with medical history of end-stage renal disease, COPD, tobacco abuse who was sent from CIR due to encephalopathy.  Patient was recently discharged after being admitted for left fibular fracture, went to rehabilitation subsequently was readmitted on February 19, 2017 for elevated blood pressure at the time he was altered and was missing dialysis for which he required intubation for airway support.  Subsequently patient improved and went to inpatient rehabilitation.  Patient has improved very little, but has been declining for a few several days.  There is concern for uremia and patient as patient has not been dialyzed on schedule.  Patient was transferred to inpatient care for further evaluation.  Patient blood pressure has been trending down with low temperatures and elevated white count.  There is concern for occult infection/sepsis therefore patient was started on IV antibiotics.  CT of the head unremarkable for acute abnormalities.   Subjective: Non verbal, more awake today. Per nursing staff have few words occasionally. Getting HD at bedside   Assessment & Plan: Acute metabolic encephalopathy ? PRESS Multifactorial questionable press syndrome with possible permanent sequela, uremia, questionable infection. Continues to be encephalopathic, and uremic.  He is receiving dialysis at bedside. CT head with no acute abnormalities, patient was started on antibiotic with improvement of white count and blood pressure.  Blood cultures so far negative, checks x-ray with no acute process On previous admission patient had hypertensive encephalopathy, with improving blood pressure but mental status has not getting any better.  Questionable if some permanent brain damage.  Although patient is very uremic which could be the reason why he  is so encephalopathic.  CT of the head was ordered and is unremarkable for acute abnormalities. He will get dialysis again tomorrow if there are no improvement will contact neurology for formal evaluation Avoid sedative agents.  End-stage renal disease Continue hemodialysis per nephrology  Hypotension - Improved  Some improvement after abx and fluid  Will continue current management   Type 2 diabetes mellitus CBGs stable  COPD No signs of COPD exacerbation at this time, chest x-ray with no acute abnormality Continue to monitor  Failure to thrive  I suspect this is due to encephalopathy, will see if improved in the next 28 hours. Palliative care has been consulted. Continue to monitor for now    DVT prophylaxis: Heparin sq  Code Status: Full Code  Family Communication: None at bedside  Disposition Plan: TBD   Consultants:   Nephrology   Procedures:   HD  Antimicrobials:  Zosyn 03/06/17  Vanc 03/06/17   Objective: Vitals:   03/07/17 1300 03/07/17 1330 03/07/17 1400 03/07/17 1420  BP: 120/66 130/65 121/61 (!) 131/47  Pulse: (!) 107 89 100 (!) 106  Resp:    18  Temp:    98.2 F (36.8 C)  TempSrc:    Axillary  SpO2:      Weight:        Intake/Output Summary (Last 24 hours) at 03/07/2017 1644 Last data filed at 03/07/2017 1420 Gross per 24 hour  Intake 120 ml  Output -500 ml  Net 620 ml   Filed Weights   03/06/17 1830 03/06/17 2214 03/07/17 0945  Weight: 76.4 kg (168 lb 6.9 oz) 77.3 kg (170 lb 6.7 oz) 78 kg (171 lb 15.3 oz)    Examination:  General: Alert  and awake but nonverbal Cardiovascular: RRR, S1/S2 +, no rubs, no gallops Respiratory: CTA bilaterally, no wheezing, no rhonchi Abdominal: Soft, NT, ND Extremities: no edema Neuro: Not following commands, encephalopathic  Data Reviewed: I have personally reviewed following labs and imaging studies  CBC: Recent Labs  Lab 03/03/17 1745 03/04/17 2112 03/05/17 0627 03/06/17 1050 03/07/17 1052    WBC 14.0* 16.4* 18.3* 17.4* 16.1*  NEUTROABS  --   --   --   --  13.2*  HGB 12.5* 12.8* 14.7 14.4 12.1*  HCT 37.8* 39.2 43.5 44.0 36.6*  MCV 101.9* 102.6* 102.6* 104.5* 103.4*  PLT 410* 322 317 343 916   Basic Metabolic Panel: Recent Labs  Lab 03/02/17 2044 03/03/17 1745 03/04/17 2112 03/06/17 1050  NA 142 140 139 139  K 4.5 4.3 4.6 5.2*  CL 96* 97* 97* 96*  CO2 21* 22 21* 21*  GLUCOSE 199* 183* 136* 156*  BUN 117* 124* 71* 59*  CREATININE 18.22* 17.35* 13.16* 12.19*  CALCIUM 9.9 9.9 9.9 10.1  PHOS 7.2* 7.1* 7.3*  --    GFR: Estimated Creatinine Clearance: 6.4 mL/min (A) (by C-G formula based on SCr of 12.19 mg/dL (H)). Liver Function Tests: Recent Labs  Lab 03/02/17 2044 03/03/17 1745 03/04/17 2112 03/06/17 1050  AST  --   --   --  54*  ALT  --   --   --  39  ALKPHOS  --   --   --  53  BILITOT  --   --   --  0.9  PROT  --   --   --  7.5  ALBUMIN 3.5 3.5 3.7 4.0   No results for input(s): LIPASE, AMYLASE in the last 168 hours. No results for input(s): AMMONIA in the last 168 hours. Coagulation Profile: No results for input(s): INR, PROTIME in the last 168 hours. Cardiac Enzymes: No results for input(s): CKTOTAL, CKMB, CKMBINDEX, TROPONINI in the last 168 hours. BNP (last 3 results) No results for input(s): PROBNP in the last 8760 hours. HbA1C: Recent Labs    03/05/17 0627  HGBA1C 5.3   CBG: Recent Labs  Lab 03/05/17 2152 03/06/17 0615 03/06/17 1200 03/07/17 0757 03/07/17 1203  GLUCAP 139* 141* 142* 92 106*   Lipid Profile: No results for input(s): CHOL, HDL, LDLCALC, TRIG, CHOLHDL, LDLDIRECT in the last 72 hours. Thyroid Function Tests: No results for input(s): TSH, T4TOTAL, FREET4, T3FREE, THYROIDAB in the last 72 hours. Anemia Panel: No results for input(s): VITAMINB12, FOLATE, FERRITIN, TIBC, IRON, RETICCTPCT in the last 72 hours. Sepsis Labs: Recent Labs  Lab 03/06/17 1050  LATICACIDVEN 2.9*    Recent Results (from the past 240  hour(s))  Culture, Urine     Status: None   Collection Time: 03/02/17  1:00 PM  Result Value Ref Range Status   Specimen Description URINE, CLEAN CATCH  Final   Special Requests NONE  Final   Culture NO GROWTH  Final   Report Status 03/03/2017 FINAL  Final  Culture, blood (Routine X 2) w Reflex to ID Panel     Status: None (Preliminary result)   Collection Time: 03/06/17  5:00 PM  Result Value Ref Range Status   Specimen Description BLOOD HEMODIALYSIS CATHETER  Final   Special Requests   Final    BOTTLES DRAWN AEROBIC AND ANAEROBIC Blood Culture results may not be optimal due to an excessive volume of blood received in culture bottles   Culture NO GROWTH < 24 HOURS  Final  Report Status PENDING  Incomplete  Culture, blood (Routine X 2) w Reflex to ID Panel     Status: None (Preliminary result)   Collection Time: 03/06/17  5:15 PM  Result Value Ref Range Status   Specimen Description BLOOD HEMODIALYSIS CATHETER  Final   Special Requests   Final    BOTTLES DRAWN AEROBIC AND ANAEROBIC Blood Culture results may not be optimal due to an excessive volume of blood received in culture bottles   Culture NO GROWTH < 24 HOURS  Final   Report Status PENDING  Incomplete      Radiology Studies: Ct Head Wo Contrast  Result Date: 03/06/2017 CLINICAL DATA:  Altered mental status. EXAM: CT HEAD WITHOUT CONTRAST TECHNIQUE: Contiguous axial images were obtained from the base of the skull through the vertex without intravenous contrast. COMPARISON:  02/19/2017 CT 01/21/2017 MRI FINDINGS: Brain: Motion degraded exam. Generalized atrophy with chronic small-vessel ischemic changes throughout the cerebral hemispheric white matter. No sign of acute infarction, mass lesion, hemorrhage, hydrocephalus or extra-axial collection. Vascular: No abnormal vascular finding other than chronic atherosclerotic calcification. Skull: Negative Sinuses/Orbits: Clear/normal Other: None IMPRESSION: Motion degraded study. No acute  finding. Atrophy and chronic small-vessel ischemic changes as seen previously. Electronically Signed   By: Nelson Chimes M.D.   On: 03/06/2017 11:52   Portable Chest 1 View  Result Date: 03/06/2017 CLINICAL DATA:  Shortness of breath EXAM: PORTABLE CHEST 1 VIEW COMPARISON:  March 02, 2017 FINDINGS: Stable central line/catheter. The heart, hila, mediastinum, lungs, and pleura are otherwise normal. IMPRESSION: No active disease. Electronically Signed   By: Dorise Bullion III M.D   On: 03/06/2017 07:49      Scheduled Meds: . atorvastatin  20 mg Oral q1800  . cholecalciferol  1,000 Units Oral Daily  . docusate  100 mg Oral BID  . feeding supplement (NEPRO CARB STEADY)  237 mL Oral BID BM  . fluconazole  100 mg Oral Daily  . folic acid  1 mg Oral Daily  . heparin  5,000 Units Subcutaneous Q8H  . heparin  8,000 Units Dialysis Once in dialysis  . heparin  8,600 Units Dialysis Once in dialysis  . insulin aspart  0-9 Units Subcutaneous TID WC  . pantoprazole sodium  40 mg Per Tube Daily  . sevelamer carbonate  2.4 g Oral TID WC   Continuous Infusions: . sodium chloride    . sodium chloride    . sodium chloride 65 mL/hr at 03/07/17 1025  . piperacillin-tazobactam    . piperacillin-tazobactam (ZOSYN)  IV    . [START ON 03/08/2017] vancomycin       LOS: 2 days    Time spent: Total of 25 minutes spent with pt, greater than 50% of which was spent in discussion of  treatment, counseling and coordination of care   Chipper Oman, MD Pager: Text Page via [email or web address]   If 7PM-7AM, please contact night-coverage [email or web address] 03/07/2017, 4:44 PM

## 2017-03-07 NOTE — Progress Notes (Signed)
As per [facility] R.N. -she did not apply the restraint during the hemodialysis treatment.

## 2017-03-08 ENCOUNTER — Inpatient Hospital Stay (HOSPITAL_COMMUNITY)
Admission: AD | Admit: 2017-03-08 | Discharge: 2017-03-08 | Disposition: A | Discharge status: Home / Self Care | Payer: Medicare Other | Source: Ambulatory Visit | Attending: Family Medicine | Admitting: Family Medicine

## 2017-03-08 ENCOUNTER — Inpatient Hospital Stay (HOSPITAL_COMMUNITY): Payer: Medicare Other | Admitting: Physical Therapy

## 2017-03-08 ENCOUNTER — Inpatient Hospital Stay (HOSPITAL_COMMUNITY): Payer: Medicare Other | Admitting: Occupational Therapy

## 2017-03-08 DIAGNOSIS — G9340 Encephalopathy, unspecified: Secondary | ICD-10-CM

## 2017-03-08 DIAGNOSIS — G9341 Metabolic encephalopathy: Secondary | ICD-10-CM

## 2017-03-08 LAB — COMPREHENSIVE METABOLIC PANEL
ALK PHOS: 50 U/L (ref 38–126)
ALT: 70 U/L — AB (ref 17–63)
ANION GAP: 15 (ref 5–15)
AST: 70 U/L — ABNORMAL HIGH (ref 15–41)
Albumin: 3.4 g/dL — ABNORMAL LOW (ref 3.5–5.0)
BILIRUBIN TOTAL: 1 mg/dL (ref 0.3–1.2)
BUN: 26 mg/dL — ABNORMAL HIGH (ref 6–20)
CALCIUM: 9 mg/dL (ref 8.9–10.3)
CO2: 21 mmol/L — AB (ref 22–32)
CREATININE: 7.54 mg/dL — AB (ref 0.61–1.24)
Chloride: 100 mmol/L — ABNORMAL LOW (ref 101–111)
GFR calc Af Amer: 8 mL/min — ABNORMAL LOW (ref >60)
GFR, EST NON AFRICAN AMERICAN: 7 mL/min — AB (ref >60)
Glucose, Bld: 93 mg/dL (ref 65–99)
Potassium: 4.3 mmol/L (ref 3.5–5.1)
SODIUM: 136 mmol/L (ref 135–145)
TOTAL PROTEIN: 6.5 g/dL (ref 6.5–8.1)

## 2017-03-08 LAB — PROTIME-INR
INR: 1.19
PROTHROMBIN TIME: 15 s (ref 11.4–15.2)

## 2017-03-08 LAB — CBC
HCT: 34.1 % — ABNORMAL LOW (ref 39.0–52.0)
HEMOGLOBIN: 11.1 g/dL — AB (ref 13.0–17.0)
MCH: 33.6 pg (ref 26.0–34.0)
MCHC: 32.6 g/dL (ref 30.0–36.0)
MCV: 103.3 fL — ABNORMAL HIGH (ref 78.0–100.0)
Platelets: 248 10*3/uL (ref 150–400)
RBC: 3.3 MIL/uL — ABNORMAL LOW (ref 4.22–5.81)
RDW: 15.2 % (ref 11.5–15.5)
WBC: 9 10*3/uL (ref 4.0–10.5)

## 2017-03-08 LAB — AMMONIA: Ammonia: 27 umol/L (ref 9–35)

## 2017-03-08 LAB — PHOSPHORUS: PHOSPHORUS: 7.1 mg/dL — AB (ref 2.5–4.6)

## 2017-03-08 LAB — MAGNESIUM: MAGNESIUM: 2.1 mg/dL (ref 1.7–2.4)

## 2017-03-08 LAB — GLUCOSE, CAPILLARY
GLUCOSE-CAPILLARY: 99 mg/dL (ref 65–99)
GLUCOSE-CAPILLARY: 113 mg/dL — AB (ref 65–99)

## 2017-03-08 MED ORDER — HEPARIN SODIUM (PORCINE) 1000 UNIT/ML DIALYSIS
8600.0000 [IU] | Freq: Once | INTRAMUSCULAR | Status: DC
  Filled 2017-03-08: qty 9

## 2017-03-08 MED ORDER — HEPARIN SODIUM (PORCINE) 5000 UNIT/ML IJ SOLN
5000.0000 [IU] | Freq: Three times a day (TID) | INTRAMUSCULAR | Status: DC
  Administered 2017-03-09: 5000 [IU] via SUBCUTANEOUS
  Filled 2017-03-08 (×2): qty 1

## 2017-03-08 MED ORDER — GUAIFENESIN 100 MG/5ML PO SOLN
5.0000 mL | ORAL | Status: DC | PRN
  Administered 2017-03-08: 100 mg via ORAL
  Filled 2017-03-08 (×2): qty 5

## 2017-03-08 MED ORDER — VANCOMYCIN HCL IN DEXTROSE 750-5 MG/150ML-% IV SOLN
INTRAVENOUS | Status: AC
  Administered 2017-03-08: 750 mg via INTRAVENOUS
  Filled 2017-03-08: qty 150

## 2017-03-08 MED ORDER — LORAZEPAM 2 MG/ML IJ SOLN
1.0000 mg | Freq: Once | INTRAMUSCULAR | Status: AC
  Administered 2017-03-08: 1 mg via INTRAVENOUS

## 2017-03-08 MED ORDER — LORAZEPAM 2 MG/ML IJ SOLN
INTRAMUSCULAR | Status: AC
  Administered 2017-03-08: 1 mg via INTRAVENOUS
  Filled 2017-03-08: qty 1

## 2017-03-08 NOTE — Progress Notes (Signed)
EEG complete - results pending 
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
## 2017-03-08 NOTE — Progress Notes (Signed)
Palliative:  Noted plans for dialysis today. I have spoken with daughter, Beckie Busing, who will meet with me tomorrow 1/29 1000 am for GOC. Thank you for this consult.   Vinie Sill, NP Palliative Medicine Team Pager # [phone] (M-F 8a-5p) Team Phone # [phone] (Nights/Weekends)

## 2017-03-08 NOTE — Procedures (Signed)
ELECTROENCEPHALOGRAM REPORT  Date of Study: 03/08/2017  Patient's Name: [name] MRN: [id number] Date of Birth: 16-Jul-1950  Referring Provider: Jacob Moores, NP  Clinical History: This is a 67 year old man with altered mental status.  Medications: Folic acid Haldol Insulin  Technical Summary: A multichannel digital EEG recording measured by the international 10-20 system with electrodes applied with paste and impedances below 5000 ohms performed as portable with EKG monitoring in an awake and drowsy patient.  Hyperventilation and photic stimulation were not performed.  The digital EEG was referentially recorded, reformatted, and digitally filtered in a variety of bipolar and referential montages for optimal display.   Description: The patient is awake and drowsy during the recording. He is noted to be confused, does not follow commands  During maximal wakefulness, there is a symmetric, medium voltage 5 Hz posterior dominant rhythm that attenuates with eye opening. This is admixed with a moderate amount of diffuse 4-5 Hz theta and occasional diffuse 2-3 Hz delta slowing of the waking background.  During drowsiness, there is an increase in theta and delta slowing of the background. Normal sleep architecture is not seen. Hyperventilation and photic stimulation were not performed.  There were no epileptiform discharges or electrographic seizures seen.    EKG lead was unremarkable.  Impression: This awake and drowsy EEG is abnormal due to moderate diffuse slowing of the waking background.  Clinical Correlation of the above findings indicates diffuse cerebral dysfunction that is non-specific in etiology and can be seen with hypoxic/ischemic injury, toxic/metabolic encephalopathies, neurodegenerative disorders, or medication effect.  The absence of epileptiform discharges does not rule out a clinical diagnosis of epilepsy.  Clinical correlation is advised.   Ellouise Newer, M.D.

## 2017-03-08 NOTE — Progress Notes (Signed)
Pharmacy Antibiotic Note  [name] is a 67 y.o. male  with possible sepsis.  Pharmacy has been consulted for vancomycin and Zosyn dosing.   He is noted with ESRD on HD MWF. Went for a makeup session 1/26 and again 1/27. Received loading dose of vancomycin after HD session on 1/26, and received vancomycin 750mg  after HD yesterday. Current estimated level ~68mcg/mL which is just above desired range.    Plan: -Vancomycin 750mg  IV qHD-MWF -Zosyn 3.375g IV q12h EI -Follow HD schedule/tolerance, clinical progression, c/s, GOC, LOT   Weight: 174 lb 9.7 oz (79.2 kg)  Temp (24hrs), Avg:98 F (36.7 C), Min:97.8 F (36.6 C), Max:98.3 F (36.8 C)  Recent Labs  Lab 03/02/17 2044 03/03/17 1745 03/04/17 2112 03/05/17 0627 03/06/17 1050 03/07/17 1052  WBC  --  14.0* 16.4* 18.3* 17.4* 16.1*  CREATININE 18.22* 17.35* 13.16*  --  12.19*  --   LATICACIDVEN  --   --   --   --  2.9*  --     Estimated Creatinine Clearance: 6.5 mL/min (A) (by C-G formula based on SCr of 12.19 mg/dL (H)).    Allergies  Allergen Reactions  . Baclofen     Pt had AMS when baclofen was given so unable to assess- it is contraindicated in ESRD pts due to causing AMS  . Pravastatin Other (See Comments)    MYALGIAS, MUSCLE PAIN  . Simvastatin Other (See Comments)    MYALGIAS, MUSCLE PAIN    Vanc 1/26>> Zosyn 1/26>>  BCx 1/26: ngtd   Thank you for allowing pharmacy to be a part of this patient's care.  Vincenza Dail D. Orbin Mayeux, PharmD, BCPS Clinical Pharmacist Clinical Phone for 03/08/2017 until 3:30pm: [phone] If after 3:30pm, please call main pharmacy at [phone] 03/08/2017 2:37 PM

## 2017-03-08 NOTE — Progress Notes (Signed)
Patient ID: [name], male   DOB: November 21, 1950, 67 y.o.   MRN: [id number]   Dtr Beckie Busing is NOT consenting for thrombolysis at this point She prefers dialysis catheter----"fistula has been trouble over and over" Answered many questions about both She is coming to Hospital tomorrow to meet with MD  HOLD for thrombolysis of dialysis fistula for now

## 2017-03-08 NOTE — Consult Note (Signed)
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 care.  A copy of this report was sent to the requesting provider on this date.  Electronically Signed: Lavonia Drafts, PA-C 03/08/2017, 9:01 AM   I spent a total of 40 Minutes    in face to face in clinical consultation, greater than 50% of which was counseling/coordinating care for RUA fistula thrombolysis

## 2017-03-08 NOTE — Progress Notes (Signed)
Daily Progress Note   Patient Name: [name]       Date: 03/08/2017 DOB: October 13, 1950  Age: 67 y.o. MRN#: [id number] Attending Physician: Patrecia Pour, Christean Grief, MD Primary Care Physician: [place] Date: 03/05/2017  Reason for Consultation/Follow-up: Establishing goals of care  Subjective: [name] is extremely somnolent today. Briefly opens eyes spontaneously (not to verbal or tactile stimuli). Not following any commands.   Length of Stay: 3  Current Medications: Scheduled Meds:  . atorvastatin  20 mg Oral q1800  . cholecalciferol  1,000 Units Oral Daily  . docusate  100 mg Oral BID  . feeding supplement (NEPRO CARB STEADY)  237 mL Oral BID BM  . fluconazole  100 mg Oral Daily  . folic acid  1 mg Oral Daily  . [START ON 03/09/2017] heparin  5,000 Units Subcutaneous Q8H  . heparin  8,000 Units Dialysis Once in dialysis  . heparin  8,600 Units Dialysis Once in dialysis  . insulin aspart  0-9 Units Subcutaneous TID WC  . pantoprazole sodium  40 mg Per Tube Daily  . sevelamer carbonate  2.4 g Oral TID WC    Continuous Infusions: . sodium chloride    . sodium chloride    . sodium chloride 65 mL/hr at 03/08/17 0236  . piperacillin-tazobactam    . piperacillin-tazobactam (ZOSYN)  IV Stopped (03/07/17 2321)  . vancomycin      PRN Meds: sodium chloride, sodium chloride, albuterol, alteplase, food thickener, haloperidol, haloperidol lactate, heparin, heparin, HYDROcodone-acetaminophen, lidocaine (PF), lidocaine-prilocaine, pentafluoroprop-tetrafluoroeth  Physical Exam  Constitutional: He appears well-developed.  HENT:  Head: Normocephalic and atraumatic.  Cardiovascular: Normal rate and regular rhythm.  Pulmonary/Chest: No accessory muscle usage. No tachypnea.  No respiratory distress.  Congested cough  Abdominal: Soft. Normal appearance.  Musculoskeletal:  LLE hard cast  Neurological:  Minimally responsive  Nursing note and vitals reviewed.           Vital Signs: BP 129/70 (BP Location: Right Leg)   Pulse 77   Temp 97.8 F (36.6 C) (Axillary)   Resp 16   Wt 79.2 kg (174 lb 9.7 oz)   SpO2 97%   BMI 23.04 kg/m  SpO2: SpO2: 97 % O2 Device: O2 Device: Not Delivered O2 Flow Rate:    Intake/output summary:   Intake/Output  Summary (Last 24 hours) at 03/08/2017 5631 Last data filed at 03/08/2017 0409 Gross per 24 hour  Intake 630 ml  Output -500 ml  Net 1130 ml   LBM: Last BM Date: 03/06/17 Baseline Weight: Weight: 58 kg (127 lb 13.9 oz) Most recent weight: Weight: 79.2 kg (174 lb 9.7 oz)       Palliative Assessment/Data: 10%    Flowsheet Rows     Most Recent Value  Intake Tab  Referral Department  Hospitalist  Unit at Time of Referral  Med/Surg Unit  Palliative Care Primary Diagnosis  Nephrology  Date Notified  03/05/17  Palliative Care Type  New Palliative care  Reason for referral  Clarify Goals of Care  Date of Admission  03/05/17  Date first seen by Palliative Care  03/07/17  # of days Palliative referral response time  2 Day(s)  # of days IP prior to Palliative referral  0  Clinical Assessment  Palliative Performance Scale Score  20%  Pain Max last 24 hours  Not able to report  Pain Min Last 24 hours  Not able to report  Dyspnea Max Last 24 Hours  Not able to report  Dyspnea Min Last 24 hours  Not able to report  Nausea Max Last 24 Hours  Not able to report  Nausea Min Last 24 Hours  Not able to report  Anxiety Max Last 24 Hours  Not able to report  Anxiety Min Last 24 Hours  Not able to report  Other Max Last 24 Hours  Not able to report  Psychosocial & Spiritual Assessment  Palliative Care Outcomes  Patient/Family meeting held?  No      Patient Active Problem List   Diagnosis Date Noted  . Palliative  care encounter   . Goals of care, counseling/discussion   . Poor mobility 03/05/2017  . Failure to thrive in adult 03/05/2017  . Secondary hypertension   . ESRD (end stage renal disease) ([place])   . Diabetes mellitus type 2 in nonobese (H[facility])   . Encephalopathy   . Tobacco abuse   . Acute lower UTI   . Respiratory failure ([place]) 02/19/2017  . Acute respiratory failure with hypoxemia ([place]) 02/19/2017  . ESRD on dialysis ([place])   . Anemia of chronic disease   . Diabetes mellitus type 2 in obese ([place])   . Hypertensive encephalopathy 02/03/2017  . Leukocytosis   . Hyperlipidemia   . Benign essential HTN   . COPD (chronic obstructive pulmonary disease) ([place])   . Closed fracture of right distal fibula   . Abnormal swallowing   . Altered mental status   . SIRS (systemic inflammatory response syndrome) ([place]) 01/21/2017  . Hypertensive urgency 01/21/2017  . Closed fracture of distal end of left fibula 01/21/2017  . Anemia due to chronic renal failure treated with erythropoietin, stage 5 (HCC) 01/21/2017  . Acute metabolic encephalopathy 49/70/2637  . Hyperkalemia 01/20/2017  . Acute respiratory distress   . End stage renal disease on dialysis ([place])   . Influenza 02/28/2016  . Acute respiratory failure with hypoxia ([place]) 03/14/2015  . Acute renal failure superimposed on stage 4 chronic kidney disease ([place]) 03/13/2015  . Acute renal failure (ARF) ([place]) 03/09/2015  . COPD exacerbation ([place])   . Type 2 diabetes mellitus with end-stage renal disease ([place]) 03/08/2015  . Anemia 03/08/2015  . Acute on chronic renal failure ([place]) 03/08/2015  . Urinary retention 03/08/2015  . Kidney lesion, native, left 03/08/2015  . Pericardial effusion 03/08/2015  .  COPD IV / still smoking  03/08/2015  . Cigarette smoker 03/08/2015  . CHRONIC KIDNEY DISEASE STAGE III (MODERATE) 11/06/2009  . ELEVATED PROSTATE SPECIFIC ANTIGEN 11/04/2009  . HYPERLIPIDEMIA 10/10/2009  . ERECTILE DYSFUNCTION 10/10/2009  .  Essential hypertension 10/10/2009  . PREDIABETES 10/10/2009  . COLONIC POLYPS, HX OF 10/10/2009    Palliative Care Assessment & Plan   HPI: 67 y.o. male  with past medical history of ESRD on HD, DM, h/o recent left fibular fracture, dysphagia, COPD, HTN, HLD, h/o prostate cancer s/p prostatectomy. Patient has several recent hospitalizations including 01/20/17 to 02/03/17 for AMS thought related to ?OSA and PRES. He was discharged to inpatient rehab where he slowly improved and was discharged home with daughter on 02/16/17. Patient was again hospitalized 02/19/17 to 02/26/17 for AMS related to hypertensive encephalopathy and acute respiratory failure requiring intubation. Patient was eventually extubated and was again discharged to inpatient rehab, where he continued to have increasing confusion and weakness and was suspected to have uremia and probable lasting effects from encephalopathy. Patient was readmitted on 03/05/2017 with same. His hospitalization has been complicated by agitation, and confusion. He is receiving serial dialysis sessions with hopes of improving mental status. Prognosis is overall poor and palliative consulted to discuss with family.   Assessment: I met today at [name] bedside. Note plans for repeat dialysis today. [name] is not alert enough today for any po intake. I opened his curtain and turned on lights but he still did not arouse with repeat verbal and tactile stimulation. Does not exhibit any signs of discomfort at this time. He does have a congested cough. I left message for daughter, Beckie Busing, who returned my call and is willing to meet with me tomorrow 1/29 1000 am.   Recommendations/Plan:  Agree with HD today  Continue NPO and if arouses would have SLP to follow  Goals of Care and Additional Recommendations:  Limitations on Scope of Treatment: TBD  Code Status:   Full code - will address with family  Prognosis:   Overall poor. Dependent on his response to  current interventions and aggressiveness of care.   Discharge Planning:  To Be Determined    Thank you for allowing the Palliative Medicine Team to assist in the care of this patient.   Total Time 25 min Prolonged Time Billed  no       Greater than 50%  of this time was spent counseling and coordinating care related to the above assessment and plan.  Vinie Sill, NP Palliative Medicine Team Pager # [phone] (M-F 8a-5p) Team Phone # [phone] (Nights/Weekends)

## 2017-03-08 NOTE — Care Management Note (Signed)
Case Management Note  Patient Details  Name: TENNIS MCKINNON MRN: [id number] Date of Birth: 1950-06-15  Subjective/Objective:  Admitted  from [facility] inpatient rehab to the hospital due to encephalopathy on 03/05/17. recent 1(03/22/16) left fibular fracture and nonweightbearing.    Per chart at baseline prior to to the fall patient lives alone and was independent.   Action/Plan: Neurology was consulted due to greater than 72 hours of  decreased level of consciousness. Repeat MRI of the brain - EEG pending - Check BMP and ammonia -  Frequent neuro checks - Stop all sedatives and pain meds - Agree with palliative care consult  Discharge planning Services  CM Consult  Status of Service:  In process, will continue to follow  Additional Comments: NCM will continue to follow for discharge transition.  Kristen Cardinal, BSN, RN Nurse case manager [phone] 03/08/2017, 2:39 PM

## 2017-03-08 NOTE — Progress Notes (Signed)
HD tx initiated via HD cath w/ cath function issues , no pull /little pull after coughing from AP, lines reversed, VSS, will cont to monitor while on HD tx

## 2017-03-08 NOTE — Progress Notes (Signed)
PROGRESS NOTE [facility]   [name]   BTD:[id number] DOB: October 01, 1950  DOA: 03/05/2017 PCP: [facility]   Brief Narrative:  [name] 67 year old male with medical history of end-stage renal disease, COPD, tobacco abuse who was sent from CIR due to encephalopathy.  Patient was recently discharged after being admitted for left fibular fracture, went to rehabilitation subsequently was readmitted on February 19, 2017 for elevated blood pressure at the time he was altered and was missing dialysis for which he required intubation for airway support.  Subsequently patient improved and went to inpatient rehabilitation.  Patient has improved very little, but has been declining for a few several days.  There is concern for uremia and as patient has not been dialyzed on schedule.  Patient was transferred to inpatient care for further evaluation.  Patient blood pressure has been trending down with low temperatures and elevated white count.  There is concern for occult infection/sepsis therefore patient was started on IV antibiotics.  CT of the head unremarkable for acute abnormalities.   Subjective: Continues to be non verbal, open eyes when call name but close them again. Have no interaction. Doe not follow commands. Refusing to take medications.   Assessment & Plan: Acute metabolic encephalopathy ? PRESS Multifactorial questionable press syndrome, uremia, questionable infection. Continues to be encephalopathic despite getting HD x3, concern for AVG not been working as fistula seems to be clotted, probably contributing to uremia, labs pending today. CT head with no acute abnormalities, patient was started on antibiotic with improvement of white count and blood pressure.  Blood cultures so far negative, checks x-ray with no acute process On previous admission patient had hypertensive encephalopathy, with improving blood pressure but mental status has not getting any better.   Questionable if some permanent brain damage.  Neurology has been consulted, f/u MRI, EEG and ammonia levels  IR was consulted for possible thrombolysis of the fistula, but daughter declined   For now will continue Abx will readdress in am if no significant improvement  Continue to avoid sedative agents.  End-stage renal disease Continue hemodialysis per nephrology  Hypotension - Improved  Some improvement after abx and fluid  Continue IVF as patient is not fluid overload, but monitor closely   Type 2 diabetes mellitus CBGs stable  COPD No signs of COPD exacerbation at this time, chest x-ray with no acute abnormality Continue to monitor  Failure to thrive  Palliative care consult    DVT prophylaxis: Heparin sq  Code Status: Full Code  Family Communication: None at bedside  Disposition Plan: TBD   Consultants:   Nephrology   Procedures:   HD  Antimicrobials:  Zosyn 03/06/17  Vanc 03/06/17   Objective: Vitals:   03/07/17 1420 03/07/17 2305 03/08/17 0408 03/08/17 1001  BP: (!) 131/47 138/81 129/70 132/69  Pulse: (!) 106  77 74  Resp: 18 17 16 18   Temp: 98.2 F (36.8 C) 98.3 F (36.8 C) 97.8 F (36.6 C) 98 F (36.7 C)  TempSrc: Axillary Oral Axillary Axillary  SpO2: 98% 100% 97% 98%  Weight: 79 kg (174 lb 2.6 oz) 79.2 kg (174 lb 9.7 oz)      Intake/Output Summary (Last 24 hours) at 03/08/2017 1536 Last data filed at 03/08/2017 1002 Gross per 24 hour  Intake 630 ml  Output 0 ml  Net 630 ml   Filed Weights   03/07/17 0945 03/07/17 1420 03/07/17 2305  Weight: 78 kg (171 lb 15.3 oz) 79 kg (174  lb 2.6 oz) 79.2 kg (174 lb 9.7 oz)    Examination:  General: Awake upon calling his name, but non verbal  Cardiovascular: RRR, S1/S2 +, no rubs, no gallops Respiratory: CTA bilaterally, no wheezing, no rhonchi Abdominal: Soft, NT, ND, bowel sounds + Extremities: no edema Neuro: Not following commands, encephalopathic   Data Reviewed: I have personally reviewed  following labs and imaging studies  CBC: Recent Labs  Lab 03/03/17 1745 03/04/17 2112 03/05/17 0627 03/06/17 1050 03/07/17 1052  WBC 14.0* 16.4* 18.3* 17.4* 16.1*  NEUTROABS  --   --   --   --  13.2*  HGB 12.5* 12.8* 14.7 14.4 12.1*  HCT 37.8* 39.2 43.5 44.0 36.6*  MCV 101.9* 102.6* 102.6* 104.5* 103.4*  PLT 410* 322 317 343 235   Basic Metabolic Panel: Recent Labs  Lab 03/02/17 2044 03/03/17 1745 03/04/17 2112 03/06/17 1050  NA 142 140 139 139  K 4.5 4.3 4.6 5.2*  CL 96* 97* 97* 96*  CO2 21* 22 21* 21*  GLUCOSE 199* 183* 136* 156*  BUN 117* 124* 71* 59*  CREATININE 18.22* 17.35* 13.16* 12.19*  CALCIUM 9.9 9.9 9.9 10.1  PHOS 7.2* 7.1* 7.3*  --    GFR: Estimated Creatinine Clearance: 6.5 mL/min (A) (by C-G formula based on SCr of 12.19 mg/dL (H)). Liver Function Tests: Recent Labs  Lab 03/02/17 2044 03/03/17 1745 03/04/17 2112 03/06/17 1050  AST  --   --   --  54*  ALT  --   --   --  39  ALKPHOS  --   --   --  53  BILITOT  --   --   --  0.9  PROT  --   --   --  7.5  ALBUMIN 3.5 3.5 3.7 4.0   No results for input(s): LIPASE, AMYLASE in the last 168 hours. No results for input(s): AMMONIA in the last 168 hours. Coagulation Profile: No results for input(s): INR, PROTIME in the last 168 hours. Cardiac Enzymes: No results for input(s): CKTOTAL, CKMB, CKMBINDEX, TROPONINI in the last 168 hours. BNP (last 3 results) No results for input(s): PROBNP in the last 8760 hours. HbA1C: No results for input(s): HGBA1C in the last 72 hours. CBG: Recent Labs  Lab 03/06/17 1200 03/07/17 0757 03/07/17 1203 03/07/17 1715 03/08/17 0752  GLUCAP 142* 92 106* 80 99   Lipid Profile: No results for input(s): CHOL, HDL, LDLCALC, TRIG, CHOLHDL, LDLDIRECT in the last 72 hours. Thyroid Function Tests: No results for input(s): TSH, T4TOTAL, FREET4, T3FREE, THYROIDAB in the last 72 hours. Anemia Panel: No results for input(s): VITAMINB12, FOLATE, FERRITIN, TIBC, IRON,  RETICCTPCT in the last 72 hours. Sepsis Labs: Recent Labs  Lab 03/06/17 1050  LATICACIDVEN 2.9*    Recent Results (from the past 240 hour(s))  Culture, Urine     Status: None   Collection Time: 03/02/17  1:00 PM  Result Value Ref Range Status   Specimen Description URINE, CLEAN CATCH  Final   Special Requests NONE  Final   Culture NO GROWTH  Final   Report Status 03/03/2017 FINAL  Final  Culture, blood (Routine X 2) w Reflex to ID Panel     Status: None (Preliminary result)   Collection Time: 03/06/17  5:00 PM  Result Value Ref Range Status   Specimen Description BLOOD HEMODIALYSIS CATHETER  Final   Special Requests   Final    BOTTLES DRAWN AEROBIC AND ANAEROBIC Blood Culture results may not be optimal due to  an excessive volume of blood received in culture bottles   Culture NO GROWTH < 24 HOURS  Final   Report Status PENDING  Incomplete  Culture, blood (Routine X 2) w Reflex to ID Panel     Status: None (Preliminary result)   Collection Time: 03/06/17  5:15 PM  Result Value Ref Range Status   Specimen Description BLOOD HEMODIALYSIS CATHETER  Final   Special Requests   Final    BOTTLES DRAWN AEROBIC AND ANAEROBIC Blood Culture results may not be optimal due to an excessive volume of blood received in culture bottles   Culture NO GROWTH < 24 HOURS  Final   Report Status PENDING  Incomplete     Radiology Studies: No results found.   Scheduled Meds: . atorvastatin  20 mg Oral q1800  . cholecalciferol  1,000 Units Oral Daily  . docusate  100 mg Oral BID  . feeding supplement (NEPRO CARB STEADY)  237 mL Oral BID BM  . fluconazole  100 mg Oral Daily  . folic acid  1 mg Oral Daily  . [START ON 03/09/2017] heparin  5,000 Units Subcutaneous Q8H  . heparin  8,000 Units Dialysis Once in dialysis  . heparin  8,600 Units Dialysis Once in dialysis  . insulin aspart  0-9 Units Subcutaneous TID WC  . pantoprazole sodium  40 mg Per Tube Daily  . sevelamer carbonate  2.4 g Oral TID WC     Continuous Infusions: . sodium chloride    . sodium chloride    . sodium chloride 65 mL/hr at 03/08/17 0236  . piperacillin-tazobactam    . piperacillin-tazobactam (ZOSYN)  IV 3.375 g (03/08/17 1126)  . vancomycin       LOS: 3 days    Time spent: Total of 15 minutes spent with pt, greater than 50% of which was spent in discussion of  treatment, counseling and coordination of care   Chipper Oman, MD Pager: Text Page via [email or web address]   If 7PM-7AM, please contact night-coverage [email or web address] 03/08/2017, 3:36 PM

## 2017-03-08 NOTE — Progress Notes (Signed)
[facility] Progress Note   Subjective:  Sleeping, wakes to voice, then falls back asleep.   Objective Vitals:   03/07/17 1400 03/07/17 1420 03/07/17 2305 03/08/17 0408  BP: 121/61 (!) 131/47 138/81 129/70  Pulse: 100 (!) 106  77  Resp:  18 17 16   Temp:  98.2 F (36.8 C) 98.3 F (36.8 C) 97.8 F (36.6 C)  TempSrc:  Axillary Oral Axillary  SpO2:  98% 100% 97%  Weight:  79 kg (174 lb 2.6 oz) 79.2 kg (174 lb 9.7 oz)    Physical Exam General: WNWD male NAD, hands in mittens Heart: RRR Lungs: CTA, anteriorly  Abdomen: soft +BS Extremities: no LE edema L leg in cast  Dialysis Access: R IJ TDC; R AVF no bruit    Recent Labs  Lab 03/02/17 2044 03/03/17 1745 03/04/17 2112 03/06/17 1050  NA 142 140 139 139  K 4.5 4.3 4.6 5.2*  CL 96* 97* 97* 96*  CO2 21* 22 21* 21*  GLUCOSE 199* 183* 136* 156*  BUN 117* 124* 71* 59*  CREATININE 18.22* 17.35* 13.16* 12.19*  CALCIUM 9.9 9.9 9.9 10.1  PHOS 7.2* 7.1* 7.3*  --     Recent Labs  Lab 03/03/17 1745 03/04/17 2112 03/05/17 0627 03/06/17 1050 03/07/17 1052  WBC 14.0* 16.4* 18.3* 17.4* 16.1*  NEUTROABS  --   --   --   --  13.2*  HGB 12.5* 12.8* 14.7 14.4 12.1*  HCT 37.8* 39.2 43.5 44.0 36.6*  MCV 101.9* 102.6* 102.6* 104.5* 103.4*  PLT 410* 322 317 343 290    Recent Labs  Lab 03/06/17 1200 03/07/17 0757 03/07/17 1203 03/07/17 1715 03/08/17 0752  GLUCAP 142* 92 106* 80 99    Medications: . sodium chloride    . sodium chloride    . sodium chloride 65 mL/hr at 03/08/17 0236  . piperacillin-tazobactam    . piperacillin-tazobactam (ZOSYN)  IV Stopped (03/07/17 2321)  . vancomycin     . atorvastatin  20 mg Oral q1800  . cholecalciferol  1,000 Units Oral Daily  . docusate  100 mg Oral BID  . feeding supplement (NEPRO CARB STEADY)  237 mL Oral BID BM  . fluconazole  100 mg Oral Daily  . folic acid  1 mg Oral Daily  . [START ON 03/09/2017] heparin  5,000 Units Subcutaneous Q8H  . heparin  8,000  Units Dialysis Once in dialysis  . heparin  8,600 Units Dialysis Once in dialysis  . insulin aspart  0-9 Units Subcutaneous TID WC  . pantoprazole sodium  40 mg Per Tube Daily  . sevelamer carbonate  2.4 g Oral TID WC    Dialysis Orders:  MWF  [facility] 4h 64min  2/2 bath  400/800 85kg  Hep 8600  R AVF and right IJ -Calcitriol 2.0 mcg PO TIW -Sensipar 120 mg PO TIW -Venofer 50 mg IV weekly -Mircera 60 mcg IV q 2 weeks (Last dose 02/12/17)  Background: transferred back to inpatient unit 03/05/17 due encephalopathy  Assessment/Plan:  1. Encephalopathy- uremic +/- other ? PRES with relative BP drop.S/p cath exchange but still some catheter flow problems. Head CT 1/26 with no acute findings. From prior notes seemed to be improving with serial HD. Labs do look as if was grossly underdialyzed but are improving. Neuro has been consulted. Their note reviewed. For repeat MRI of brain to eval for PRES, EEG,  2. ESRD- Usually MWF. Missed HD Fri. HD Sat 1/26 and Sun 1/27. Back on schedule today.  Using TDC  3. Access issues right upper AVF: duplex showed severe narrowing at outflow vein prox upper arm - 02/23/17 - AVF not audible-For  f'gram possible declot today Pt's daughter would not consent to declot today 4. Anemiaof CKD-Hgb high 12 -14 - onweekly Fe; stop Aranesp for now- suspect hemoconcentration 5. Secondary hyperparathyroidism- Ca up to 10.1 - resumed sensipar 1/25 - hold on resuming calcitriol until P comes down;continue binders.- has been taking binders for the most part - hold on increasing for now - even though P elevated 6. HTN/volume- BP low/normal.  Weights significantly below edw if accurate- eating little. Even UF with HD today. Getting gentle IVF. Slow to 50. 7. COPD - per primary 8. DM- per primary 9. Ankle frx - per primaryleft LE cast 10. Deconditioning - off rehab 2/2 inability to participate 11. Tachycardia /leukocytosis - HR ^ new leukocytosis;  hemoconcentration suggests otherwise- work up per Hospitalists when admitted -[place] UTI 1/12- next 1/22 ; last BC drawn 1/11 CXR neg 1/26 12. MRSA precautions 13. Nutrition - intake marginal/suppl ordered - on D1 diet- honey thick changed to reg renal carb mod -eating very little   [name] PA-C [facility] Pager [phone] 03/08/2017,10:01 AM  LOS: 3 days   I have seen and examined this patient and agree with plan and assessment in the above note with renal recommendations/intervention highlighted. Neuro evaluating persistent AMS (azotemia/labs markedly better with serial HD but pt NOT better so hesitant to label this uremic encephalopathy). For EEG, MRI. Next HD tonight.   Naresh Althaus B,MD 03/08/2017 6:46 PM

## 2017-03-09 ENCOUNTER — Inpatient Hospital Stay (HOSPITAL_COMMUNITY): Payer: Medicare Other

## 2017-03-09 LAB — GLUCOSE, CAPILLARY
GLUCOSE-CAPILLARY: 84 mg/dL (ref 65–99)
GLUCOSE-CAPILLARY: 86 mg/dL (ref 65–99)
Glucose-Capillary: 93 mg/dL (ref 65–99)
Glucose-Capillary: 76 mg/dL (ref 65–99)
Glucose-Capillary: 96 mg/dL (ref 65–99)

## 2017-03-09 NOTE — Progress Notes (Signed)
HD tx completed @ 2345 w/o problem r/t the HD tx such as bp/HR but pt wouldn't f/c and constantly interfered w/ tx by leaning his head up putting pressure on cath and making it alarm, constantly clearing his throat making it alarm, wouldn't lay down, trying to get OOB, said "get me up out of here", he was all over the bed, not redirectable, got order for guaifenesen for his cough, but he still kept clearing his throat making machine alarm, ativan 70m IVP one time dose was ineffective, he didn't even get drowsy from it, UF goal of keep even was met, blood rinsed back, report called to CMargie Ege RN

## 2017-03-09 NOTE — Progress Notes (Signed)
Subjective: Patient remains confused, does not realize that he is in the hospital, attempts to follow commands  Exam: Vitals:   03/09/17 0055 03/09/17 0622  BP: 136/61 (!) 129/51  Pulse: 98 100  Resp: (!) 21 (!) 21  Temp: 98.5 F (36.9 C) 98.2 F (36.8 C)  SpO2: 100% 100%    Physical Exam   HEENT-  Normocephalic, no lesions, without obvious abnormality.  Normal external eye and conjunctiva.   Cardiovascular- S1-S2 audible, pulses palpable throughout   Lungs-no rhonchi or wheezing noted, no excessive working breathing.  Saturations within normal limits Abdomen- All 4 quadrants palpated and nontender Extremities-right leg in cast Musculoskeletal-no joint tenderness, deformity or swelling Skin-warm and dry, no hyperpigmentation, vitiligo, or suspicious lesions    Neuro:  Mental Status: Patient is alert, he is only oriented to name.  He does not realize that he is in the hospital.  Patient mumbles and is very hard to understand. Cranial Nerves: II: Appears to blink to threat bilaterally III,IV, VI: ptosis not present, extra-ocular motions intact bilaterally pupils equal, round, reactive to light and accommodation V,VII: smile symmetric, facial light touch sensation normal bilaterally VIII: hearing normal bilaterally IX,X: Unable to assess XI: Unable to assess XII: We will to assess Motor: Very difficult to assess his strength and motor.  Patient had some difficulty following commands.  When asked to raise his arms he continued to raise his right arm.  He rates his right arm is showed 4/5 strength.  His left arm he finally raised which I can only assess a 3/5 strength.  He tend to perseverate on raising his right arm.  He did withdraw from noxious stimuli with good strength on his left leg.  He also withdrew on his right leg but not so much however he has cast that goes up to mid calf. Sensory: Sensation is intact to noxious stimuli bilaterally upper and lower extremities Deep  Tendon Reflexes: 2+ and symmetric throughout--with the exception of his left lower extremity due to his cast Plantars: Right: downgoing   Left: Due to patient's cast could not assess Cerebellar: Unable to assess secondary to patient's confusion Gait: Not able to assess    Medications:  Prior to Admission:  Medications Prior to Admission  Medication Sig Dispense Refill Last Dose  . acetaminophen (TYLENOL) 500 MG tablet Take 1 tablet (500 mg total) by mouth every 6 (six) hours as needed for mild pain or moderate pain. 30 tablet 0 02/18/2017  . albuterol (PROVENTIL HFA;VENTOLIN HFA) 108 (90 Base) MCG/ACT inhaler Inhale 1-2 puffs into the lungs every 6 (six) hours as needed for wheezing. 1 Inhaler 0 02/16/2017  . amLODipine (NORVASC) 10 MG tablet Take 1 tablet (10 mg total) by mouth daily. 30 tablet 0 02/18/2017 at Unknown time  . atorvastatin (LIPITOR) 20 MG tablet Take 1 tablet (20 mg total) by mouth daily at 6 PM. 30 tablet 0 02/18/2017 at Unknown time  . cholecalciferol (VITAMIN D) 1000 units tablet Take 1,000 Units by mouth daily.   02/18/2017 at Unknown time  . cinacalcet (SENSIPAR) 30 MG tablet 120 mg, Oral, Custom (Once per day on Mon Wed Fri), (Patient taking differently: Take 120 mg by mouth every Monday, Wednesday, and Friday. 120 mg, Oral, Custom (Once per day on Mon Wed Fri),) 60 tablet 0 unknown  . docusate sodium (COLACE) 100 MG capsule Take 1 capsule (100 mg total) by mouth 2 (two) times daily. 10 capsule 0   . folic acid (FOLVITE) 1 MG tablet Take  1 tablet (1 mg total) by mouth daily. 30 tablet 0 02/18/2017 at Unknown time  . haloperidol (HALDOL) 2 MG tablet Take 1 tablet (2 mg total) by mouth every 8 (eight) hours as needed for agitation.     . hydrALAZINE (APRESOLINE) 25 MG tablet Take 1 tablet (25 mg total) by mouth every 8 (eight) hours. 90 tablet 0 unknown  . HYDROcodone-acetaminophen (NORCO/VICODIN) 5-325 MG tablet Take 1 tablet by mouth every 6 (six) hours as needed for moderate  pain.   maybe 1/10 at pm  . Nutritional Supplements (FEEDING SUPPLEMENT, NEPRO CARB STEADY,) LIQD Take 237 mLs by mouth 2 (two) times daily between meals.  0   . pantoprazole (PROTONIX) 40 MG tablet Take 1 tablet (40 mg total) by mouth daily. 30 tablet 0 unknown  . sevelamer carbonate (RENVELA) 2.4 g PACK Take 2.4 g by mouth 3 (three) times daily with meals. 90 each    . tamsulosin (FLOMAX) 0.4 MG CAPS capsule Take 1 capsule (0.4 mg total) by mouth daily. 30 capsule 0 02/18/2017 at Unknown time   Scheduled: . docusate  100 mg Oral BID  . feeding supplement (NEPRO CARB STEADY)  237 mL Oral BID BM  . folic acid  1 mg Oral Daily  . heparin  5,000 Units Subcutaneous Q8H  . insulin aspart  0-9 Units Subcutaneous TID WC  . sevelamer carbonate  2.4 g Oral TID WC   Continuous: . sodium chloride 50 mL/hr at 03/08/17 1852  . piperacillin-tazobactam    . piperacillin-tazobactam (ZOSYN)  IV 3.375 g (03/09/17 0545)  . vancomycin 750 mg (03/08/17 2245)    Pertinent Labs/Diagnostics: EEG-this showed awake and drowsy EEG with no epileptiform activity. MRI pending Phosphorus 7.1 AST and ALT were both 70 BUN 26 Creatinine 7.54   Etta Quill PA-C [facility] [phone]  Impression:  Metabolic encephalopathy likely secondary to uremia/hypertensive encephalopathy.  Patient's ammonia was within normal limits.  At this point his creatinine has come down secondary to dialysis.  Patient is awaiting MRI to assess for PRES. in addition patient is deconditioned/failure to thrive.  Recommendations: 1)MRI to evaluate for PRES 2) Agree with pa;;iative care consult 3) IF MRI is negative neurology will S/O    03/09/2017, 8:51 AM   NEUROHOSPITALIST ADDENDUM Seen and examined the patient today. Formulated plan as documented above by PAC/Resident. I agree with recommendations as above. Will follow.  Patient continues to improve, better compared to yesterday. He is disoriented but alert  and able to conversate. Follows commands. EEG shows showed awake and drowsy EEG with no epileptiform activity. MRI pending.     Karena Addison Aroor MD [facility] [phone]  If 7pm to 7am, please call on call as listed on AMION.

## 2017-03-09 NOTE — Progress Notes (Signed)
Daily Progress Note   Patient Name: [name]       Date: 03/09/2017 DOB: Jun 18, 1950  Age: 67 y.o. MRN#: [id number] Attending Physician: Patrecia Pour, Christean Grief, MD Primary Care Physician: [place] Date: 03/05/2017  Reason for Consultation/Follow-up: Establishing goals of care  Subjective: [name] is a little more alert although still refusing care and confused.   Length of Stay: 4  Current Medications: Scheduled Meds:  . docusate  100 mg Oral BID  . feeding supplement (NEPRO CARB STEADY)  237 mL Oral BID BM  . folic acid  1 mg Oral Daily  . heparin  5,000 Units Subcutaneous Q8H  . insulin aspart  0-9 Units Subcutaneous TID WC  . sevelamer carbonate  2.4 g Oral TID WC    Continuous Infusions: . sodium chloride 50 mL/hr at 03/08/17 1852  . piperacillin-tazobactam    . piperacillin-tazobactam (ZOSYN)  IV 3.375 g (03/09/17 0545)  . vancomycin 750 mg (03/08/17 2245)    PRN Meds: albuterol, food thickener, guaiFENesin, haloperidol, haloperidol lactate  Physical Exam  Constitutional: He appears well-developed. He appears lethargic.  HENT:  Head: Normocephalic and atraumatic.  Cardiovascular: Normal rate and regular rhythm.  Pulmonary/Chest: No accessory muscle usage. No tachypnea. No respiratory distress.  Congested cough  Abdominal: Soft. Normal appearance.  Musculoskeletal:  LLE hard cast  Neurological: He appears lethargic. He is disoriented.  Nursing note and vitals reviewed.           Vital Signs: BP (!) 159/73 (BP Location: Left Arm)   Pulse 90   Temp 98.2 F (36.8 C) (Axillary)   Resp (!) 21   Wt 79.2 kg (174 lb 9.7 oz)   SpO2 100%   BMI 23.04 kg/m  SpO2: SpO2: 100 % O2 Device: O2 Device: Not Delivered O2 Flow Rate:    Intake/output  summary:   Intake/Output Summary (Last 24 hours) at 03/09/2017 1014 Last data filed at 03/09/2017 0622 Gross per 24 hour  Intake 1763 ml  Output 0 ml  Net 1763 ml   LBM: Last BM Date: 03/06/17 Baseline Weight: Weight: 58 kg (127 lb 13.9 oz) Most recent weight: Weight: 79.2 kg (174 lb 9.7 oz)       Palliative Assessment/Data: 20%    Flowsheet Rows     Most Recent Value  Intake Tab  Referral Department  Hospitalist  Unit at Time of Referral  Med/Surg Unit  Palliative Care Primary Diagnosis  Nephrology  Date Notified  03/05/17  Palliative Care Type  New Palliative care  Reason for referral  Clarify Goals of Care  Date of Admission  03/05/17  Date first seen by Palliative Care  03/07/17  # of days Palliative referral response time  2 Day(s)  # of days IP prior to Palliative referral  0  Clinical Assessment  Palliative Performance Scale Score  20%  Pain Max last 24 hours  Not able to report  Pain Min Last 24 hours  Not able to report  Dyspnea Max Last 24 Hours  Not able to report  Dyspnea Min Last 24 hours  Not able to report  Nausea Max Last 24 Hours  Not able to report  Nausea Min Last 24 Hours  Not able to report  Anxiety Max Last 24 Hours  Not able to report  Anxiety Min Last 24 Hours  Not able to report  Other Max Last 24 Hours  Not able to report  Psychosocial & Spiritual Assessment  Palliative Care Outcomes  Patient/Family meeting held?  No      Patient Active Problem List   Diagnosis Date Noted  . Palliative care encounter   . Goals of care, counseling/discussion   . Poor mobility 03/05/2017  . Failure to thrive in adult 03/05/2017  . Secondary hypertension   . ESRD (end stage renal disease) ([place])   . Diabetes mellitus type 2 in nonobese (H[facility])   . Encephalopathy   . Tobacco abuse   . Acute lower UTI   . Respiratory failure ([place]) 02/19/2017  . Acute respiratory failure with hypoxemia ([place]) 02/19/2017  . ESRD on dialysis ([place])   . Anemia of chronic  disease   . Diabetes mellitus type 2 in obese ([place])   . Hypertensive encephalopathy 02/03/2017  . Leukocytosis   . Hyperlipidemia   . Benign essential HTN   . COPD (chronic obstructive pulmonary disease) ([place])   . Closed fracture of right distal fibula   . Abnormal swallowing   . Altered mental status   . SIRS (systemic inflammatory response syndrome) ([place]) 01/21/2017  . Hypertensive urgency 01/21/2017  . Closed fracture of distal end of left fibula 01/21/2017  . Anemia due to chronic renal failure treated with erythropoietin, stage 5 (HCC) 01/21/2017  . Acute metabolic encephalopathy 38/25/0539  . Hyperkalemia 01/20/2017  . Acute respiratory distress   . End stage renal disease on dialysis ([place])   . Influenza 02/28/2016  . Acute respiratory failure with hypoxia ([place]) 03/14/2015  . Acute renal failure superimposed on stage 4 chronic kidney disease ([place]) 03/13/2015  . Acute renal failure (ARF) ([place]) 03/09/2015  . COPD exacerbation ([place])   . Type 2 diabetes mellitus with end-stage renal disease ([place]) 03/08/2015  . Anemia 03/08/2015  . Acute on chronic renal failure ([place]) 03/08/2015  . Urinary retention 03/08/2015  . Kidney lesion, native, left 03/08/2015  . Pericardial effusion 03/08/2015  . COPD IV / still smoking  03/08/2015  . Cigarette smoker 03/08/2015  . CHRONIC KIDNEY DISEASE STAGE III (MODERATE) 11/06/2009  . ELEVATED PROSTATE SPECIFIC ANTIGEN 11/04/2009  . HYPERLIPIDEMIA 10/10/2009  . ERECTILE DYSFUNCTION 10/10/2009  . Essential hypertension 10/10/2009  . PREDIABETES 10/10/2009  . COLONIC POLYPS, HX OF 10/10/2009    Palliative Care Assessment & Plan   HPI: 68 y.o. male  with past medical history of ESRD on HD,  DM, h/o recent left fibular fracture, dysphagia, COPD, HTN, HLD, h/o prostate cancer s/p prostatectomy. Patient has several recent hospitalizations including 01/20/17 to 02/03/17 for AMS thought related to ?OSA and PRES. He was discharged to inpatient rehab  where he slowly improved and was discharged home with daughter on 02/16/17. Patient was again hospitalized 02/19/17 to 02/26/17 for AMS related to hypertensive encephalopathy and acute respiratory failure requiring intubation. Patient was eventually extubated and was again discharged to inpatient rehab, where he continued to have increasing confusion and weakness and was suspected to have uremia and probable lasting effects from encephalopathy. Patient was readmitted on 03/05/2017 with same. His hospitalization has been complicated by agitation, and confusion. He is receiving serial dialysis sessions with hopes of improving mental status. Prognosis is overall poor and palliative consulted to discuss with family.   Assessment: Mr. Bungert seems slightly more responsive today and opens eyes to calling his name (albeit briefly) and grunts when I ask him a question. He is pulling blanket up and turning over. RN reports he is refusing meds and labs and that he was speaking with them this morning and answering their questions (even with a sarcastic answer they report). Per notes he struggled with dialysis yesterday and is not being very cooperative with care. Not sure how he will tolerate MRI without sedation.   I met today with daughter, Beckie Busing. Beckie Busing has 3 sisters but they have no seen her or her father in years. She did contact 2 of them who she believes came to see him in the hospital recently. She has not been able to find successful contact info for the other sister. We had long discussion regarding Mr. Marinello poor functional status and poor prognosis. Discussed concern with tolerating dialysis and discussed neurology recommendations. She is tearful as she clearly sees his decline. She shares that they had a discussion ~1 week ago when she told him that he needed to tell her when he was "ready to stop" as he was talking of being tired of being in the hospital. She is tearful that he will likely not be able to tell her  this now. We did discuss that in a way maybe he is telling us by refusing meds and labs.   Beckie Busing says that she just wants some answers. She understands that this is likely not reversible at this stage but would feel better having information - she very much is interested in MRI results. We discussed plan to pursue MRI for more information but also discussed code status and life support. She is tearful and needs time to think about this. She also understands that dialysis is likely not an option unless he has significant improvement. Emotional support provided.   Recommendations/Plan:  HD per renal.  For MRI per neurology. PLEASE ATTEMPT ASAP. Family very interested in results and could potentially help them make decisions based on findings.   Continue NPO and if arouses would have SLP to follow  Goals of Care and Additional Recommendations:  Limitations on Scope of Treatment: TBD  Code Status:   Full code - dtr considering DNR  Prognosis:   Overall poor. Dependent on his response to current interventions and aggressiveness of care.   Discharge Planning:  To Be Determined    Thank you for allowing the Palliative Medicine Team to assist in the care of this patient.   Total Time 35 min Prolonged Time Billed  no       Greater than 50%  of this time  was spent counseling and coordinating care related to the above assessment and plan.  Vinie Sill, NP Palliative Medicine Team Pager # [phone] (M-F 8a-5p) Team Phone # [phone] (Nights/Weekends)

## 2017-03-09 NOTE — Progress Notes (Signed)
[facility] Progress Note   Subjective:  In deep sleep. Not rousing.  Restless during HD last night For MRI to assess PRES  Objective Vitals:   03/08/17 2352 03/09/17 0055 03/09/17 0622 03/09/17 0911  BP: 139/73 136/61 (!) 129/51 (!) 159/73  Pulse: (!) 102 98 100 90  Resp: (!) 23 (!) 21 (!) 21 (!) 21  Temp: 99.6 F (37.6 C) 98.5 F (36.9 C) 98.2 F (36.8 C) 98.2 F (36.8 C)  TempSrc: Axillary Axillary Axillary Axillary  SpO2: 99% 100% 100%   Weight: 79 kg (174 lb 2.6 oz) 79.2 kg (174 lb 9.7 oz)     Physical Exam General: WNWD male NAD, hands in mittens Heart: RRR Lungs: CTA, anteriorly  Abdomen: soft +BS Extremities: no LE edema L leg in cast  Dialysis Access: R IJ TDC; R AVF no bruit    Recent Labs  Lab 03/03/17 1745 03/04/17 2112 03/06/17 1050 03/08/17 1559  NA 140 139 139 136  K 4.3 4.6 5.2* 4.3  CL 97* 97* 96* 100*  CO2 22 21* 21* 21*  GLUCOSE 183* 136* 156* 93  BUN 124* 71* 59* 26*  CREATININE 17.35* 13.16* 12.19* 7.54*  CALCIUM 9.9 9.9 10.1 9.0  PHOS 7.1* 7.3*  --  7.1*    Recent Labs  Lab 03/04/17 2112 03/05/17 0627 03/06/17 1050 03/07/17 1052 03/08/17 1933  WBC 16.4* 18.3* 17.4* 16.1* 9.0  NEUTROABS  --   --   --  13.2*  --   HGB 12.8* 14.7 14.4 12.1* 11.1*  HCT 39.2 43.5 44.0 36.6* 34.1*  MCV 102.6* 102.6* 104.5* 103.4* 103.3*  PLT 322 317 343 290 248    Recent Labs  Lab 03/07/17 1715 03/08/17 0752 03/08/17 1610 03/09/17 0050 03/09/17 0740  GLUCAP 80 99 113* 84 93    Medications: . sodium chloride 50 mL/hr at 03/08/17 1852  . piperacillin-tazobactam    . piperacillin-tazobactam (ZOSYN)  IV 3.375 g (03/09/17 0545)  . vancomycin 750 mg (03/08/17 2245)   . docusate  100 mg Oral BID  . feeding supplement (NEPRO CARB STEADY)  237 mL Oral BID BM  . folic acid  1 mg Oral Daily  . heparin  5,000 Units Subcutaneous Q8H  . insulin aspart  0-9 Units Subcutaneous TID WC  . sevelamer carbonate  2.4 g Oral TID WC     Dialysis Orders:  MWF  [facility] 4h 80min  2/2 bath  400/800 85kg  Hep 8600  R AVF and right IJ -Calcitriol 2.0 mcg PO TIW -Sensipar 120 mg PO TIW -Venofer 50 mg IV weekly -Mircera 60 mcg IV q 2 weeks (Last dose 02/12/17)  Background: transferred back to inpatient unit 03/05/17 due encephalopathy  Assessment/Plan:  1. Encephalopathy- uremic +/- other ? PRES with relative BP drop.S/p cath exchange but still some catheter flow problems. Head CT 1/26 with no acute findings. . Labs do look as if was grossly underdialyzed but are improving. Neuro has been consulted.  For repeat MRI of brain to eval for PRES, EEG,  2. ESRD- Usually MWF. Missed HD Fri. HD Sat 1/26 and Sun 1/27. Back on schedule today. Next HD 1/30   Using TDC  3. Access issues right upper AVF: duplex showed severe narrowing at outflow vein prox upper arm - 02/23/17 - AVF not audible-For  f'gram possible declot today Pt's daughter would not consent to declot today 4. Anemiaof CKD-Hgb11.1  - onweekly Fe; stop Aranesp for now- suspect hemoconcentration 5. Secondary hyperparathyroidism- Ca 9.0, P  7.1 - resumed sensipar 1/25 - hold on resuming calcitriol until P comes down;continue binders.- has been taking binders for the most part - hold on increasing for now - even though P elevated 6. HTN/volume- BP low/normal.  Weights significantly below edw if accurate- eating little. Gentle IVF  Slow to 50. 7. COPD - per primary 8. DM- per primary 9. Ankle frx - per primaryleft LE cast 10. Deconditioning - off rehab 2/2 inability to participate 11. Tachycardia /leukocytosis - improving- work up per Hospitalists when admitted -[place] UTI 1/12- next 1/22 ; last BC drawn 1/11 CXR neg 1/26. BC drawn 1/26 NGTD. On empiric Vanc/Zosyn.  12. MRSA precautions 13. Nutrition - intake marginal/suppl ordered renal diet -eating very little   [name] PA-C [place] Pager [phone] 03/09/2017,10:56  AM

## 2017-03-09 NOTE — Progress Notes (Addendum)
PROGRESS NOTE [facility]   [name]   WER:[id number] DOB: April 29, 1950  DOA: 03/05/2017 PCP: [facility]   Brief Narrative:  [name] 67 year old male with medical history of end-stage renal disease, COPD, tobacco abuse who was sent from CIR due to encephalopathy.  Patient was recently discharged after being admitted for left fibular fracture, subsequently discharge home. Return on February 19, 2017 for elevated blood pressure at the time he was altered and was missing dialysis for which he required intubation for airway support.  Subsequently patient improved and went to inpatient rehabilitation.  Patient has improved very little, but has been declining for a few several days.  There is concern for uremia and as patient has not been dialyzed on schedule.  Patient was transferred to inpatient care for further evaluation.  Patient blood pressure has been trending down with low temperatures and elevated white count.  There is concern for occult infection/sepsis therefore patient was started on IV antibiotics.  CT of the head unremarkable for acute abnormalities.   Subjective: Patient responding to name, but remains non verbal or following commands. MRI pending.  Assessment & Plan: Acute metabolic encephalopathy ? PRESS Multifactorial questionable press syndrome, uremia, anoxic brain injury Continues to be encephalopathic despite getting HD x3, concern for AVG not been working as fistula seems to be clotted, probably contributing to uremia, labs pending today. CT head with no acute abnormalities, patient was started on antibiotic with improvement of white count and blood pressure.  Blood cultures so far negative, checks x-ray with no acute process On previous admission patient had hypertensive encephalopathy, with improving blood pressure but mental status has not getting any better.  Questionable if some permanent brain damage.  Neurology has been consulted - MRI pending,  EEG with signs of encephalopathy and ammonia levels normal.  Neurology following.  Unclear how long RUA fistula has been thrombosed, probably contributed  Since there has not a clear cause of infection, CXR negative, blood cultures no growth so far and no really significant improvement will d/c abx at this time After reviewing chart possible etiology could be anoxic brain injury, unknown time of hypoxia during last admission, patient was never back to baseline when he was discharge to CIR.  Continue to avoid sedatives   End-stage renal disease HD per Renal  Fistula was thrombosed, family   Hypotension - Stable  Improved with IVF   Type 2 diabetes mellitus CBGs stable  COPD No signs of COPD exacerbation at this time, chest x-ray with no acute abnormality Continue to monitor  Failure to thrive  Palliative care consult    DVT prophylaxis: Heparin sq  Code Status: Full Code  Family Communication: None at bedside  Disposition Plan: TBD   Consultants:   Nephrology   Procedures:   HD  Antimicrobials:  Zosyn 03/06/17  Vanc 03/06/17   Objective: Vitals:   03/08/17 2352 03/09/17 0055 03/09/17 0622 03/09/17 0911  BP: 139/73 136/61 (!) 129/51 (!) 159/73  Pulse: (!) 102 98 100 90  Resp: (!) 23 (!) 21 (!) 21 (!) 21  Temp: 99.6 F (37.6 C) 98.5 F (36.9 C) 98.2 F (36.8 C) 98.2 F (36.8 C)  TempSrc: Axillary Axillary Axillary Axillary  SpO2: 99% 100% 100%   Weight: 79 kg (174 lb 2.6 oz) 79.2 kg (174 lb 9.7 oz)      Intake/Output Summary (Last 24 hours) at 03/09/2017 1614 Last data filed at 03/09/2017 0622 Gross per 24 hour  Intake 1763 ml  Output 0 ml  Net 1763 ml   Filed Weights   03/08/17 1918 03/08/17 2352 03/09/17 0055  Weight: 79 kg (174 lb 2.6 oz) 79 kg (174 lb 2.6 oz) 79.2 kg (174 lb 9.7 oz)    Examination:  General: NAD, not answering to questions  Cardiovascular: RRR, S1/S2 + Respiratory: CTA bilaterally, no wheezing, no rhonchi Abdominal: Soft, NT,  ND Extremities: no edema Neuro: Not following commands, encephalopathic   Data Reviewed: I have personally reviewed following labs and imaging studies  CBC: Recent Labs  Lab 03/04/17 2112 03/05/17 0627 03/06/17 1050 03/07/17 1052 03/08/17 1933  WBC 16.4* 18.3* 17.4* 16.1* 9.0  NEUTROABS  --   --   --  13.2*  --   HGB 12.8* 14.7 14.4 12.1* 11.1*  HCT 39.2 43.5 44.0 36.6* 34.1*  MCV 102.6* 102.6* 104.5* 103.4* 103.3*  PLT 322 317 343 290 784   Basic Metabolic Panel: Recent Labs  Lab 03/02/17 2044 03/03/17 1745 03/04/17 2112 03/06/17 1050 03/08/17 1559  NA 142 140 139 139 136  K 4.5 4.3 4.6 5.2* 4.3  CL 96* 97* 97* 96* 100*  CO2 21* 22 21* 21* 21*  GLUCOSE 199* 183* 136* 156* 93  BUN 117* 124* 71* 59* 26*  CREATININE 18.22* 17.35* 13.16* 12.19* 7.54*  CALCIUM 9.9 9.9 9.9 10.1 9.0  MG  --   --   --   --  2.1  PHOS 7.2* 7.1* 7.3*  --  7.1*   GFR: Estimated Creatinine Clearance: 10.5 mL/min (A) (by C-G formula based on SCr of 7.54 mg/dL (H)). Liver Function Tests: Recent Labs  Lab 03/02/17 2044 03/03/17 1745 03/04/17 2112 03/06/17 1050 03/08/17 1559  AST  --   --   --  54* 70*  ALT  --   --   --  39 70*  ALKPHOS  --   --   --  53 50  BILITOT  --   --   --  0.9 1.0  PROT  --   --   --  7.5 6.5  ALBUMIN 3.5 3.5 3.7 4.0 3.4*   No results for input(s): LIPASE, AMYLASE in the last 168 hours. Recent Labs  Lab 03/08/17 1559  AMMONIA 27   Coagulation Profile: Recent Labs  Lab 03/08/17 1559  INR 1.19   Cardiac Enzymes: No results for input(s): CKTOTAL, CKMB, CKMBINDEX, TROPONINI in the last 168 hours. BNP (last 3 results) No results for input(s): PROBNP in the last 8760 hours. HbA1C: No results for input(s): HGBA1C in the last 72 hours. CBG: Recent Labs  Lab 03/08/17 0752 03/08/17 1610 03/09/17 0050 03/09/17 0740 03/09/17 1137  GLUCAP 99 113* 84 93 76   Lipid Profile: No results for input(s): CHOL, HDL, LDLCALC, TRIG, CHOLHDL, LDLDIRECT in the  last 72 hours. Thyroid Function Tests: No results for input(s): TSH, T4TOTAL, FREET4, T3FREE, THYROIDAB in the last 72 hours. Anemia Panel: No results for input(s): VITAMINB12, FOLATE, FERRITIN, TIBC, IRON, RETICCTPCT in the last 72 hours. Sepsis Labs: Recent Labs  Lab 03/06/17 1050  LATICACIDVEN 2.9*    Recent Results (from the past 240 hour(s))  Culture, Urine     Status: None   Collection Time: 03/02/17  1:00 PM  Result Value Ref Range Status   Specimen Description URINE, CLEAN CATCH  Final   Special Requests NONE  Final   Culture NO GROWTH  Final   Report Status 03/03/2017 FINAL  Final  Culture, blood (Routine X 2) w Reflex to ID Panel  Status: None (Preliminary result)   Collection Time: 03/06/17  5:00 PM  Result Value Ref Range Status   Specimen Description BLOOD HEMODIALYSIS CATHETER  Final   Special Requests   Final    BOTTLES DRAWN AEROBIC AND ANAEROBIC Blood Culture results may not be optimal due to an excessive volume of blood received in culture bottles   Culture NO GROWTH 3 DAYS  Final   Report Status PENDING  Incomplete  Culture, blood (Routine X 2) w Reflex to ID Panel     Status: None (Preliminary result)   Collection Time: 03/06/17  5:15 PM  Result Value Ref Range Status   Specimen Description BLOOD HEMODIALYSIS CATHETER  Final   Special Requests   Final    BOTTLES DRAWN AEROBIC AND ANAEROBIC Blood Culture results may not be optimal due to an excessive volume of blood received in culture bottles   Culture NO GROWTH 3 DAYS  Final   Report Status PENDING  Incomplete     Radiology Studies: No results found.   Scheduled Meds: . docusate  100 mg Oral BID  . feeding supplement (NEPRO CARB STEADY)  237 mL Oral BID BM  . folic acid  1 mg Oral Daily  . heparin  5,000 Units Subcutaneous Q8H  . insulin aspart  0-9 Units Subcutaneous TID WC  . sevelamer carbonate  2.4 g Oral TID WC   Continuous Infusions: . sodium chloride 50 mL/hr at 03/08/17 1852  .  piperacillin-tazobactam (ZOSYN)  IV Stopped (03/09/17 0945)  . vancomycin 750 mg (03/08/17 2245)     LOS: 4 days    Time spent: Total of 15 minutes spent with pt, greater than 50% of which was spent in discussion of  treatment, counseling and coordination of care   Chipper Oman, MD Pager: Text Page via [email or web address]   If 7PM-7AM, please contact night-coverage [email or web address] 03/09/2017, 4:14 PM

## 2017-03-09 NOTE — Progress Notes (Addendum)
Left detail message for Beckie Busing [phone] requesting return call.  Advised this not an emergency provided working hours and contact information.    Return call received from Daughter, patient information verified.  Advised of process for SNF versus home with hospice including that SW will be outreaching her if SNF; and if Home with hospice CM will be outreaching.  Stated it was ok for NCM to leave a list of hospice providers for her. NCM encouraged daughter to call if any questions or concerns. NCM will continue to follow for discharge.  Kristen Cardinal, BSN, RN Case Manager [phone]

## 2017-03-09 NOTE — Progress Notes (Signed)
Patient refused lab tech stick him for lab draw even with RN's  assistance. Patient won't let RN hold his arm,he keeps pulling it off making it hard for lab tech to stick him. Duard Spiewak, Wonda Cheng, Therapist, sports

## 2017-03-10 DIAGNOSIS — D631 Anemia in chronic kidney disease: Secondary | ICD-10-CM

## 2017-03-10 DIAGNOSIS — N185 Chronic kidney disease, stage 5: Secondary | ICD-10-CM

## 2017-03-10 LAB — CBC
HCT: 32.3 % — ABNORMAL LOW (ref 39.0–52.0)
Hemoglobin: 10.5 g/dL — ABNORMAL LOW (ref 13.0–17.0)
MCH: 33.1 pg (ref 26.0–34.0)
MCHC: 32.5 g/dL (ref 30.0–36.0)
MCV: 101.9 fL — ABNORMAL HIGH (ref 78.0–100.0)
PLATELETS: 233 10*3/uL (ref 150–400)
RBC: 3.17 MIL/uL — ABNORMAL LOW (ref 4.22–5.81)
RDW: 14.3 % (ref 11.5–15.5)
WBC: 6.7 10*3/uL (ref 4.0–10.5)

## 2017-03-10 LAB — RENAL FUNCTION PANEL
Albumin: 3 g/dL — ABNORMAL LOW (ref 3.5–5.0)
Anion gap: 16 — ABNORMAL HIGH (ref 5–15)
BUN: 22 mg/dL — AB (ref 6–20)
CALCIUM: 8.8 mg/dL — AB (ref 8.9–10.3)
CO2: 21 mmol/L — AB (ref 22–32)
CREATININE: 7.45 mg/dL — AB (ref 0.61–1.24)
Chloride: 102 mmol/L (ref 101–111)
GFR calc Af Amer: 8 mL/min — ABNORMAL LOW (ref >60)
GFR calc non Af Amer: 7 mL/min — ABNORMAL LOW (ref >60)
GLUCOSE: 80 mg/dL (ref 65–99)
Phosphorus: 6.7 mg/dL — ABNORMAL HIGH (ref 2.5–4.6)
Potassium: 3.7 mmol/L (ref 3.5–5.1)
SODIUM: 139 mmol/L (ref 135–145)

## 2017-03-10 LAB — GLUCOSE, CAPILLARY
GLUCOSE-CAPILLARY: 110 mg/dL — AB (ref 65–99)
Glucose-Capillary: 78 mg/dL (ref 65–99)
Glucose-Capillary: 75 mg/dL (ref 65–99)

## 2017-03-10 MED ORDER — HALOPERIDOL LACTATE 5 MG/ML IJ SOLN
2.0000 mg | Freq: Four times a day (QID) | INTRAMUSCULAR | Status: DC | PRN
  Filled 2017-03-10: qty 0.4

## 2017-03-10 MED ORDER — HALOPERIDOL LACTATE 2 MG/ML PO CONC
2.0000 mg | Freq: Four times a day (QID) | ORAL | Status: DC | PRN
  Filled 2017-03-10 (×2): qty 1

## 2017-03-10 MED ORDER — OXYCODONE HCL 20 MG/ML PO CONC
5.0000 mg | ORAL | Status: DC | PRN
  Administered 2017-03-11: 5 mg via ORAL
  Filled 2017-03-10: qty 1

## 2017-03-10 NOTE — Progress Notes (Signed)
[facility] Progress Note   Subjective:  Sitting in recliner in nurses' station Disruptive during the night and trying to get OOB Mumbling few words  Objective Vitals:   03/09/17 0622 03/09/17 0911 03/09/17 1758 03/09/17 2256  BP: (!) 129/51 (!) 159/73 (!) 165/68 131/82  Pulse: 100 90 89 94  Resp: (!) 21 (!) _0 Temp: 98.2 F (36.8 C) 98.2 F (36.8 C) 98 F (36.7 C) 98.6 F (37 C)  TempSrc: Axillary Axillary Oral Oral  SpO2: 100%  100% 100%  Weight:    79.3 kg (174 lb 13.2 oz)   Physical Exam WNWD male NAD, hands in mittens Heart: RRR Lungs: CTA, anteriorly  Abdomen: soft +BS Extremities: no LE edema L leg in cast  Dialysis Access: R IJ TDC; R AVF no bruit    Recent Labs  Lab 03/03/17 1745 03/04/17 2112 03/06/17 1050 03/08/17 1559  NA 140 139 139 136  K 4.3 4.6 5.2* 4.3  CL 97* 97* 96* 100*  CO2 22 21* 21* 21*  GLUCOSE 183* 136* 156* 93  BUN 124* 71* 59* 26*  CREATININE 17.35* 13.16* 12.19* 7.54*  CALCIUM 9.9 9.9 10.1 9.0  PHOS 7.1* 7.3*  --  7.1*    Recent Labs  Lab 03/04/17 2112 03/05/17 0627 03/06/17 1050 03/07/17 1052 03/08/17 1933  WBC 16.4* 18.3* 17.4* 16.1* 9.0  NEUTROABS  --   --   --  13.2*  --   HGB 12.8* 14.7 14.4 12.1* 11.1*  HCT 39.2 43.5 44.0 36.6* 34.1*  MCV 102.6* 102.6* 104.5* 103.4* 103.3*  PLT 322 317 343 290 248    Recent Labs  Lab 03/09/17 0740 03/09/17 1137 03/09/17 1720 03/09/17 2253 03/10/17 0730  GLUCAP 93 76 96 86 110*    Medications: . sodium chloride 50 mL/hr at 03/09/17 1400   . docusate  100 mg Oral BID  . feeding supplement (NEPRO CARB STEADY)  237 mL Oral BID BM  . folic acid  1 mg Oral Daily  . heparin  5,000 Units Subcutaneous Q8H  . insulin aspart  0-9 Units Subcutaneous TID WC  . sevelamer carbonate  2.4 g Oral TID WC    Dialysis Orders:  MWF  [facility] 4h 42mn  2/2 bath  400/800 85kg  Hep 8600  R AVF and right IJ -Calcitriol 2.0 mcg PO TIW -Sensipar 120 mg  PO TIW -Venofer 50 mg IV weekly -Mircera 60 mcg IV q 2 weeks (Last dose 02/12/17)  Background: transferred back to inpatient unit 03/05/17 due encephalopathy  Assessment/Plan:  1. Encephalopathy- uremic +/- other ? PRES with relative BP drop.S/p cath exchange but still some catheter flow problems. Head CT 1/26 with no acute findings. . Labs do look as if was grossly underdialyzed but are labs are much improved, patient is not. Neuro has been consulted.  Was for repeat MRI of brain to eval for PRES, EEG - refused MRI when taken down for it  2. ESRD- Usually MWF. Missed HD Fri. HD Sat 1/26 and Sun 1/27. Back on schedule today. Next HD today   Using TDC  3. Access issues right upper AVF: duplex showed severe narrowing at outflow vein prox upper arm - 02/23/17 - AVF not audible. Unclear how long clotted.  Pt's daughter would not consent to declot  4. Anemiaof CKD-Hgb11.1  - On weekly Fe; stop Aranesp for now- suspect hemoconcentration 5. Secondary hyperparathyroidism- Ca 9.0, P 7.1 - resumed sensipar 1/25 - hold on resuming calcitriol until P  comes down;continue binders. Currently NPO. Will check P today.  6. HTN/volume- BP low/normal.  Weights significantly below edw if accurate- eating little. Gentle IVF - stop. 7. COPD - per primary 8. DM- per primary 9. Ankle frx - per primaryleft LE cast 10. Deconditioning - off rehab 2/2 inability to participate 11. Tachycardia /leukocytosis - improving- work up per Hospitalists when admitted -[place] UTI 1/12- next 1/22 ; last BC drawn 1/11 CXR neg 1/26. BC drawn 1/26 NGTD. 12. MRSA precautions 13. Nutrition - intake marginal/suppl ordered renal diet -eating very little 14. Disposition: Pt is currently not suitable to return to the outpt HD unit. Refusing procedures/care, ongoing confusion,  trying to get OOB and requiring multiple personel to get back. I would strongly lean toward comfort care and Palliative is on board, has met with daughter.     [name] PA-C [facility] Pager [phone] 03/10/2017,10:01 AM   I have seen and examined this patient and agree with plan and assessment in the above note with renal recommendations/intervention highlighted. Neuro no improvement, refusing procedures including MRI. I would recommend moving toward comfort Rx. Pt not appropriate for continuation of outpt HD in his current state.   Leronda Lewers B,MD 03/10/2017 2:14 PM

## 2017-03-10 NOTE — Progress Notes (Signed)
PROGRESS NOTE [facility]   [name]   ACZ:[id number] DOB: August 14, 1950  DOA: 03/05/2017 PCP: [facility]   Brief Narrative:  [name] 67 year old male with medical history of end-stage renal disease, COPD, tobacco abuse who was sent from CIR due to encephalopathy.  Patient was recently discharged after being admitted for left fibular fracture, subsequently discharge home. Return on February 19, 2017 for elevated blood pressure at the time he was altered and was missing dialysis for which he required intubation for airway support.  Subsequently patient improved and went to inpatient rehabilitation.  Patient has improved very little, but has been declining for a few several days.  There is concern for uremia and as patient has not been dialyzed on schedule.  Patient was transferred to inpatient care for further evaluation.  Patient blood pressure has been trending down with low temperatures and elevated white count.  There is concern for occult infection/sepsis therefore patient was started on IV antibiotics.  CT of the head unremarkable for acute abnormalities.   Subjective: Pt refused MRI. Answers but confused.  Assessment & Plan: Acute metabolic encephalopathy ? PRESS Multifactorial questionable press syndrome, uremia, anoxic brain injury Continues to be encephalopathic despite getting HD x3, concern for AVG not been working as fistula seems to be clotted, probably contributing to uremia, labs pending today. CT head with no acute abnormalities, patient was started on antibiotic with improvement of white count and blood pressure.  Blood cultures so far negative, checks x-ray with no acute process Concern is for Press syndrome. Pt refused MRI. Nurse practitioner on palliative care team discussed with family and daughter wants to take patient home with hospice.    End-stage renal disease HD per Renal  Fistula was thrombosed, family   Hypotension - Stable  Improved  with IVF   Type 2 diabetes mellitus CBGs stable  COPD No signs of COPD exacerbation at this time, chest x-ray with no acute abnormality Continue to monitor  Failure to thrive  Palliative care consult    DVT prophylaxis: Heparin sq  Code Status: Full Code  Family Communication: None at bedside  Disposition Plan: TBD   Consultants:   Nephrology   Radiology  Procedures:   HD  Antimicrobials:  Zosyn 03/06/17  Vanc 03/06/17   Objective: Vitals:   03/10/17 1500 03/10/17 1530 03/10/17 1600 03/10/17 1630  BP: (!) 184/97 (!) 211/92 (!) 181/76 (!) 181/87  Pulse: 81     Resp:      Temp:      TempSrc:      SpO2:      Weight:        Intake/Output Summary (Last 24 hours) at 03/10/2017 1705 Last data filed at 03/10/2017 1093 Gross per 24 hour  Intake 600 ml  Output 150 ml  Net 450 ml   Filed Weights   03/08/17 2352 03/09/17 0055 03/09/17 2256  Weight: 79 kg (174 lb 2.6 oz) 79.2 kg (174 lb 9.7 oz) 79.3 kg (174 lb 13.2 oz)    Examination:  General: NAD, not answering to questions, awake and sitting up in bed. Cardiovascular: RRR, S1/S2 + Respiratory: CTA bilaterally, no wheezing, no rhonchi Abdominal: Soft, NT, ND Extremities: no edema, warm. Neuro: Not following commands, encephalopathic   Data Reviewed: I have personally reviewed following labs and imaging studies  CBC: Recent Labs  Lab 03/04/17 2112 03/05/17 0627 03/06/17 1050 03/07/17 1052 03/08/17 1933  WBC 16.4* 18.3* 17.4* 16.1* 9.0  NEUTROABS  --   --   --  13.2*  --   HGB 12.8* 14.7 14.4 12.1* 11.1*  HCT 39.2 43.5 44.0 36.6* 34.1*  MCV 102.6* 102.6* 104.5* 103.4* 103.3*  PLT 322 317 343 290 884   Basic Metabolic Panel: Recent Labs  Lab 03/03/17 1745 03/04/17 2112 03/06/17 1050 03/08/17 1559  NA 140 139 139 136  K 4.3 4.6 5.2* 4.3  CL 97* 97* 96* 100*  CO2 22 21* 21* 21*  GLUCOSE 183* 136* 156* 93  BUN 124* 71* 59* 26*  CREATININE 17.35* 13.16* 12.19* 7.54*  CALCIUM 9.9 9.9 10.1 9.0   MG  --   --   --  2.1  PHOS 7.1* 7.3*  --  7.1*   GFR: Estimated Creatinine Clearance: 10.5 mL/min (A) (by C-G formula based on SCr of 7.54 mg/dL (H)). Liver Function Tests: Recent Labs  Lab 03/03/17 1745 03/04/17 2112 03/06/17 1050 03/08/17 1559  AST  --   --  54* 70*  ALT  --   --  39 70*  ALKPHOS  --   --  53 50  BILITOT  --   --  0.9 1.0  PROT  --   --  7.5 6.5  ALBUMIN 3.5 3.7 4.0 3.4*   No results for input(s): LIPASE, AMYLASE in the last 168 hours. Recent Labs  Lab 03/08/17 1559  AMMONIA 27   Coagulation Profile: Recent Labs  Lab 03/08/17 1559  INR 1.19   Cardiac Enzymes: No results for input(s): CKTOTAL, CKMB, CKMBINDEX, TROPONINI in the last 168 hours. BNP (last 3 results) No results for input(s): PROBNP in the last 8760 hours. HbA1C: No results for input(s): HGBA1C in the last 72 hours. CBG: Recent Labs  Lab 03/09/17 1137 03/09/17 1720 03/09/17 2253 03/10/17 0730 03/10/17 1214  GLUCAP 76 96 86 110* 78   Lipid Profile: No results for input(s): CHOL, HDL, LDLCALC, TRIG, CHOLHDL, LDLDIRECT in the last 72 hours. Thyroid Function Tests: No results for input(s): TSH, T4TOTAL, FREET4, T3FREE, THYROIDAB in the last 72 hours. Anemia Panel: No results for input(s): VITAMINB12, FOLATE, FERRITIN, TIBC, IRON, RETICCTPCT in the last 72 hours. Sepsis Labs: Recent Labs  Lab 03/06/17 1050  LATICACIDVEN 2.9*    Recent Results (from the past 240 hour(s))  Culture, Urine     Status: None   Collection Time: 03/02/17  1:00 PM  Result Value Ref Range Status   Specimen Description URINE, CLEAN CATCH  Final   Special Requests NONE  Final   Culture NO GROWTH  Final   Report Status 03/03/2017 FINAL  Final  Culture, blood (Routine X 2) w Reflex to ID Panel     Status: None (Preliminary result)   Collection Time: 03/06/17  5:00 PM  Result Value Ref Range Status   Specimen Description BLOOD HEMODIALYSIS CATHETER  Final   Special Requests   Final    BOTTLES DRAWN  AEROBIC AND ANAEROBIC Blood Culture results may not be optimal due to an excessive volume of blood received in culture bottles   Culture NO GROWTH 4 DAYS  Final   Report Status PENDING  Incomplete  Culture, blood (Routine X 2) w Reflex to ID Panel     Status: None (Preliminary result)   Collection Time: 03/06/17  5:15 PM  Result Value Ref Range Status   Specimen Description BLOOD HEMODIALYSIS CATHETER  Final   Special Requests   Final    BOTTLES DRAWN AEROBIC AND ANAEROBIC Blood Culture results may not be optimal due to an excessive volume of blood received in  culture bottles   Culture NO GROWTH 4 DAYS  Final   Report Status PENDING  Incomplete     Radiology Studies: No results found.   Scheduled Meds: . feeding supplement (NEPRO CARB STEADY)  237 mL Oral BID BM  . insulin aspart  0-9 Units Subcutaneous TID WC   Continuous Infusions: . sodium chloride 50 mL/hr at 03/10/17 1146     LOS: 5 days    Time spent: Total of 20 minutes spent with pt, greater than 50% of which was spent in discussion of  treatment, counseling and coordination of care   Velvet Bathe, MD Pager: Text Page via [email or web address]   If 7PM-7AM, please contact night-coverage [email or web address] 03/10/2017, 5:05 PM

## 2017-03-10 NOTE — Progress Notes (Signed)
[facility] Brief Note  Notified by Palliative Care of decision to take pt home with Hospice. As such, today's dialysis treatment will be his last.  Jamal Maes, MD [place] Pager 03/10/2017, 6:32 PM

## 2017-03-10 NOTE — Progress Notes (Signed)
Daily Progress Note   Patient Name: [name]       Date: 03/10/2017 DOB: [birth year]  Age: 67 y.o. MRN#: [id number] Attending Physician: Velvet Bathe, MD Primary Care Physician: [facility] Date: 03/05/2017  Reason for Consultation/Follow-up: Establishing goals of care  Subjective: [name] is a little much more alert today but still very confused. Difficult to understand what he is saying most of the time.    Length of Stay: 5  Current Medications: Scheduled Meds:  . docusate  100 mg Oral BID  . feeding supplement (NEPRO CARB STEADY)  237 mL Oral BID BM  . folic acid  1 mg Oral Daily  . heparin  5,000 Units Subcutaneous Q8H  . insulin aspart  0-9 Units Subcutaneous TID WC  . sevelamer carbonate  2.4 g Oral TID WC    Continuous Infusions: . sodium chloride 50 mL/hr at 03/10/17 1146    PRN Meds: albuterol, food thickener, guaiFENesin, haloperidol, haloperidol lactate  Physical Exam  Constitutional: He appears well-developed.  HENT:  Head: Normocephalic and atraumatic.  Cardiovascular: Normal rate and regular rhythm.  Pulmonary/Chest: No accessory muscle usage. No tachypnea. No respiratory distress.  Congested cough  Abdominal: Soft. Normal appearance.  Musculoskeletal:  LLE hard cast  Neurological: He is alert. He is disoriented.  Nursing note and vitals reviewed.           Vital Signs: BP 131/82 (BP Location: Left Arm)   Pulse 94   Temp 98.6 F (37 C) (Oral)   Resp 19   Wt 79.3 kg (174 lb 13.2 oz)   SpO2 100%   BMI 23.07 kg/m  SpO2: SpO2: 100 % O2 Device: O2 Device: Not Delivered O2 Flow Rate:    Intake/output summary:   Intake/Output Summary (Last 24 hours) at 03/10/2017 1446 Last data filed at 03/10/2017 3614 Gross per 24 hour    Intake 600 ml  Output 150 ml  Net 450 ml   LBM: Last BM Date: 03/08/17 Baseline Weight: Weight: 58 kg (127 lb 13.9 oz) Most recent weight: Weight: 79.3 kg (174 lb 13.2 oz)       Palliative Assessment/Data: 20%    Flowsheet Rows     Most Recent Value  Intake Tab  Referral Department  Hospitalist  Unit at Time of Referral  Med/Surg Unit  Palliative Care Primary Diagnosis  Nephrology  Date Notified  03/05/17  Palliative Care Type  New Palliative care  Reason for referral  Clarify Goals of Care  Date of Admission  03/05/17  Date first seen by Palliative Care  03/07/17  # of days Palliative referral response time  2 Day(s)  # of days IP prior to Palliative referral  0  Clinical Assessment  Palliative Performance Scale Score  20%  Pain Max last 24 hours  Not able to report  Pain Min Last 24 hours  Not able to report  Dyspnea Max Last 24 Hours  Not able to report  Dyspnea Min Last 24 hours  Not able to report  Nausea Max Last 24 Hours  Not able to report  Nausea Min Last 24 Hours  Not able to report  Anxiety Max Last 24 Hours  Not able to report  Anxiety Min Last 24 Hours  Not able to report  Other Max Last 24 Hours  Not able to report  Psychosocial & Spiritual Assessment  Palliative Care Outcomes  Patient/Family meeting held?  No      Patient Active Problem List   Diagnosis Date Noted  . Palliative care encounter   . Goals of care, counseling/discussion   . Poor mobility 03/05/2017  . Failure to thrive in adult 03/05/2017  . Secondary hypertension   . ESRD (end stage renal disease) ([place])   . Diabetes mellitus type 2 in nonobese (H[facility])   . Encephalopathy   . Tobacco abuse   . Acute lower UTI   . Respiratory failure ([place]) 02/19/2017  . Acute respiratory failure with hypoxemia ([place]) 02/19/2017  . ESRD on dialysis ([place])   . Anemia of chronic disease   . Diabetes mellitus type 2 in obese ([place])   . Hypertensive encephalopathy 02/03/2017  . Leukocytosis   .  Hyperlipidemia   . Benign essential HTN   . COPD (chronic obstructive pulmonary disease) ([place])   . Closed fracture of right distal fibula   . Abnormal swallowing   . Altered mental status   . SIRS (systemic inflammatory response syndrome) ([place]) 01/21/2017  . Hypertensive urgency 01/21/2017  . Closed fracture of distal end of left fibula 01/21/2017  . Anemia due to chronic renal failure treated with erythropoietin, stage 5 (HCC) 01/21/2017  . Acute metabolic encephalopathy 50/53/9767  . Hyperkalemia 01/20/2017  . Acute respiratory distress   . End stage renal disease on dialysis ([place])   . Influenza 02/28/2016  . Acute respiratory failure with hypoxia ([place]) 03/14/2015  . Acute renal failure superimposed on stage 4 chronic kidney disease ([place]) 03/13/2015  . Acute renal failure (ARF) ([place]) 03/09/2015  . COPD exacerbation ([place])   . Type 2 diabetes mellitus with end-stage renal disease ([place]) 03/08/2015  . Anemia 03/08/2015  . Acute on chronic renal failure ([place]) 03/08/2015  . Urinary retention 03/08/2015  . Kidney lesion, native, left 03/08/2015  . Pericardial effusion 03/08/2015  . COPD IV / still smoking  03/08/2015  . Cigarette smoker 03/08/2015  . CHRONIC KIDNEY DISEASE STAGE III (MODERATE) 11/06/2009  . ELEVATED PROSTATE SPECIFIC ANTIGEN 11/04/2009  . HYPERLIPIDEMIA 10/10/2009  . ERECTILE DYSFUNCTION 10/10/2009  . Essential hypertension 10/10/2009  . PREDIABETES 10/10/2009  . COLONIC POLYPS, HX OF 10/10/2009    Palliative Care Assessment & Plan   HPI: 67 y.o. male  with past medical history of ESRD on HD, DM, h/o recent left fibular fracture, dysphagia, COPD, HTN, HLD, h/o prostate cancer s/p  prostatectomy. Patient has several recent hospitalizations including 01/20/17 to 02/03/17 for AMS thought related to ?OSA and PRES. He was discharged to inpatient rehab where he slowly improved and was discharged home with daughter on 02/16/17. Patient was again hospitalized 02/19/17 to  02/26/17 for AMS related to hypertensive encephalopathy and acute respiratory failure requiring intubation. Patient was eventually extubated and was again discharged to inpatient rehab, where he continued to have increasing confusion and weakness and was suspected to have uremia and probable lasting effects from encephalopathy. Patient was readmitted on 03/05/2017 with same. His hospitalization has been complicated by agitation, and confusion. He is receiving serial dialysis sessions with hopes of improving mental status. Prognosis is overall poor and palliative consulted to discuss with family.   Assessment: [name] continues to be very agitated and not cooperating with care. He refused MRI. Struggling in dialysis with agitation and behavior. I spoke further with daughter, Beckie Busing, via phone. She continues to be very tearful. We discussed options on where we go from here. She recognizes that hospice is our best option. Beckie Busing would like to take him home with hospice and focus on keeping him happy and comfortable at home. She realizes that time will be short without dialysis. We did discuss hospice facility as an option as well but she wishes to bring him home with her (understands he will need 24/7 care). She also confirms DNR. Emotional support provided during very difficult conversation with difficult decisions being made.   Recommendations/Plan:  Last HD today.   Home with hospice.   Comfort feeds.   Goals of Care and Additional Recommendations:  Limitations on Scope of Treatment: Comfort care  Code Status:   DNR  Prognosis:   < 2 weeks  Discharge Planning:  Home with hospice.     Thank you for allowing the Palliative Medicine Team to assist in the care of this patient.   Total Time 45 min Prolonged Time Billed  no       Greater than 50%  of this time was spent counseling and coordinating care related to the above assessment and plan.  Vinie Sill, NP Palliative Medicine  Team Pager # [phone] (M-F 8a-5p) Team Phone # [phone] (Nights/Weekends)

## 2017-03-10 NOTE — Progress Notes (Signed)
Patient transported for MRI but RN got call that patient refused procedure when he got there. MRI not done at this time. Danean Marner, Wonda Cheng, Therapist, sports

## 2017-03-10 NOTE — Progress Notes (Signed)
Patient confused and attempting multiple times to get OOB. RN tried to redirect patient but unable,patient trying to swing at 2 RNs that are in the room. Called patient's daughter so she can talk him into getting back in the bed but patient refused. Placed patient on recliner with 3 RN assist and brought to nurses' station. Lauren Aguayo, Wonda Cheng, Therapist, sports

## 2017-03-11 LAB — CULTURE, BLOOD (ROUTINE X 2)
Culture: NO GROWTH
Culture: NO GROWTH

## 2017-03-11 MED ORDER — HALOPERIDOL LACTATE 2 MG/ML PO CONC: 2.0000 mg | Freq: Four times a day (QID) | ORAL | 0 refills | Status: AC | PRN

## 2017-03-11 MED ORDER — OXYCODONE HCL 20 MG/ML PO CONC: 5.0000 mg | ORAL | 0 refills | Status: AC | PRN
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
## 2017-03-11 NOTE — Progress Notes (Signed)
Patient going to hospice. Neurology will sign off.

## 2017-03-11 NOTE — Progress Notes (Addendum)
°  [facility] Liaison:  RN visit  Notified by Joelene Millin [facility], of patient/family request for Hospice and [place] services at home after discharge.  Chart and patient information reviewed with [place] physician.  Hospice eligibility approved.  Writer spoke with Beckie Busing, daughter, over the phone to initiate education related to hospice philosophy, services and team approach to care.  Patient/family verbalized understanding of information given.  Per discussion, plan is for discharge to home by [facility]AR on 03/11/17.  Please send signed and completed DNR form home with patient/family.  Patient will need prescriptions for discharge comfort medications.  DME needs have been discussed, patient currently has the following equipment in the home:  Gilford Rile, [facility], wheelchair.  Patient/family requests the following DME for delivery to the home:  Hospital bed with 1/2 rails, OBT.  [facility] equipment manager, Gayland Curry, has been notified and will contact [place] to arrange delivery to the home.  The home address has been verified and is correct in the chart.  Moniques, daughter, is the family member to contact to arrange time of delivery at [phone].  [facility] is aware of the above.  Completed discharge summary will need to be faxed to [facility] at [phone], when final.  Please notify [facility] when patient is ready to leave the unit at discharge.  (Call [phone] or [phone] if its after 5pm.)  [facility] information and contact numbers have been left in patient's room during visit.  Above information shared with Joelene Millin, [facility]. Please call with any hospice related questions.  Thank you for the referral.  Edyth Gunnels, RN, [facility] Liaison [phone]  All hospital liaisons are now on [address].

## 2017-03-11 NOTE — Progress Notes (Signed)
Patient discharged home,transported by [facility]. Discharge papers,prescription given to patient/transport. Kiera Hussey, Wonda Cheng, Therapist, sports

## 2017-03-11 NOTE — Progress Notes (Signed)
End-stage renal disease, COPD, tobacco abuse who was sent from [facility] due to encephalopathy.  Functional deficits secondary to debility and encephalopathy. Neurological: confused. Delayed processing. Left leg limited by cast in place.Weight Bearing Restrictions: Yes; LLE Weight Bearing: Non weight bearing

## 2017-03-11 NOTE — Progress Notes (Signed)
Palliative Medicine RN Note: Symptom check. Pt has gotten one dose of Roxicodone overnight. He is in bed, relaxed, eyes closed. He does open his eyes when he hears a noise but quickly settles back in. He is wearing mittens; RN reports he was getting up and pulling at IV last night. He also refused labs this am. Plan is to go home with hospice, and HD has been stopped. Obtained order to cancel labs and d/c IV, as meds are PO. Updated RN and gave her contact info for PMT.  Marjie Skiff Maricsa Sammons, RN, BSN, [facility] Palliative Medicine Team 03/11/2017 8:44 AM Office [phone]

## 2017-03-11 NOTE — Care Management Note (Addendum)
Case Management Note  Patient Details  Name: [name] MRN: [id number] Date of Birth: 1951/01/09  Action/Plan: Home with Hospice, called placed to daughter Beckie Busing at [phone] discussed recommendation for Home with Hospice, offered choice and Beckie Busing has no preference. Referral called to Amy with [facility].  Referral accepted by [facility].  Amy has ordered equipment/submitted for eligibility.  Patient will be going home by [facility]. Notified Mary with [facility] of patient discharge orders, states awaiting patient's bed to be delivered.  Forms for Transport given to staff RN Kami along with [facility] contact information.  Verbalized understanding.   Expected Discharge Date:  03/11/2017              Expected Discharge Plan:  Home w Hospice Care  In-House Referral:  Hospice / Palliative Care  Discharge planning Services  CM Consult  Post Acute Care Choice:  Hospice Choice offered to:  Adult Children    [place] Agency:  [facility]  Status of Service:  Completed, signed off  Kristen Cardinal, RN  Nurse Case Manager [phone] 03/11/2017, 8:24 AM

## 2017-04-09 DEATH — deceased

## 2018-09-26 IMAGING — DX DG CHEST 2V
3 series · 3 of 3 positions shown · non-contrast
Comparison: 03/08/2015

CLINICAL DATA: Cough, congestion, shortness of breath

EXAM:
CHEST  2 VIEW

[chest lat]
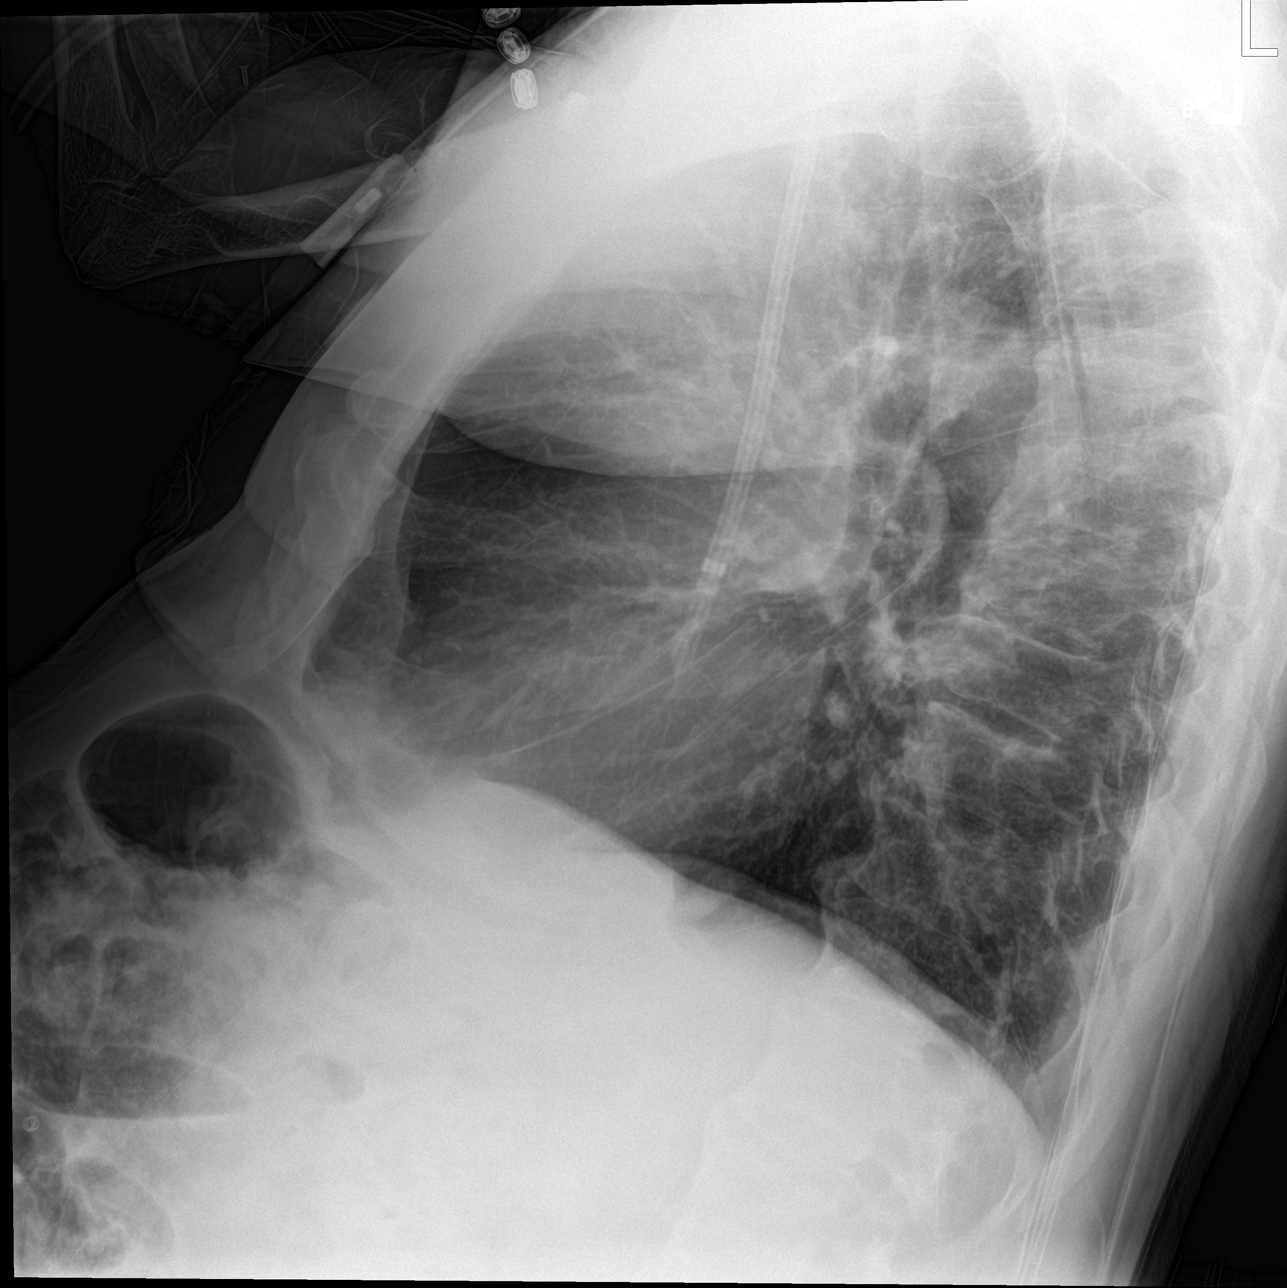

[chest ap (1 of 2)]
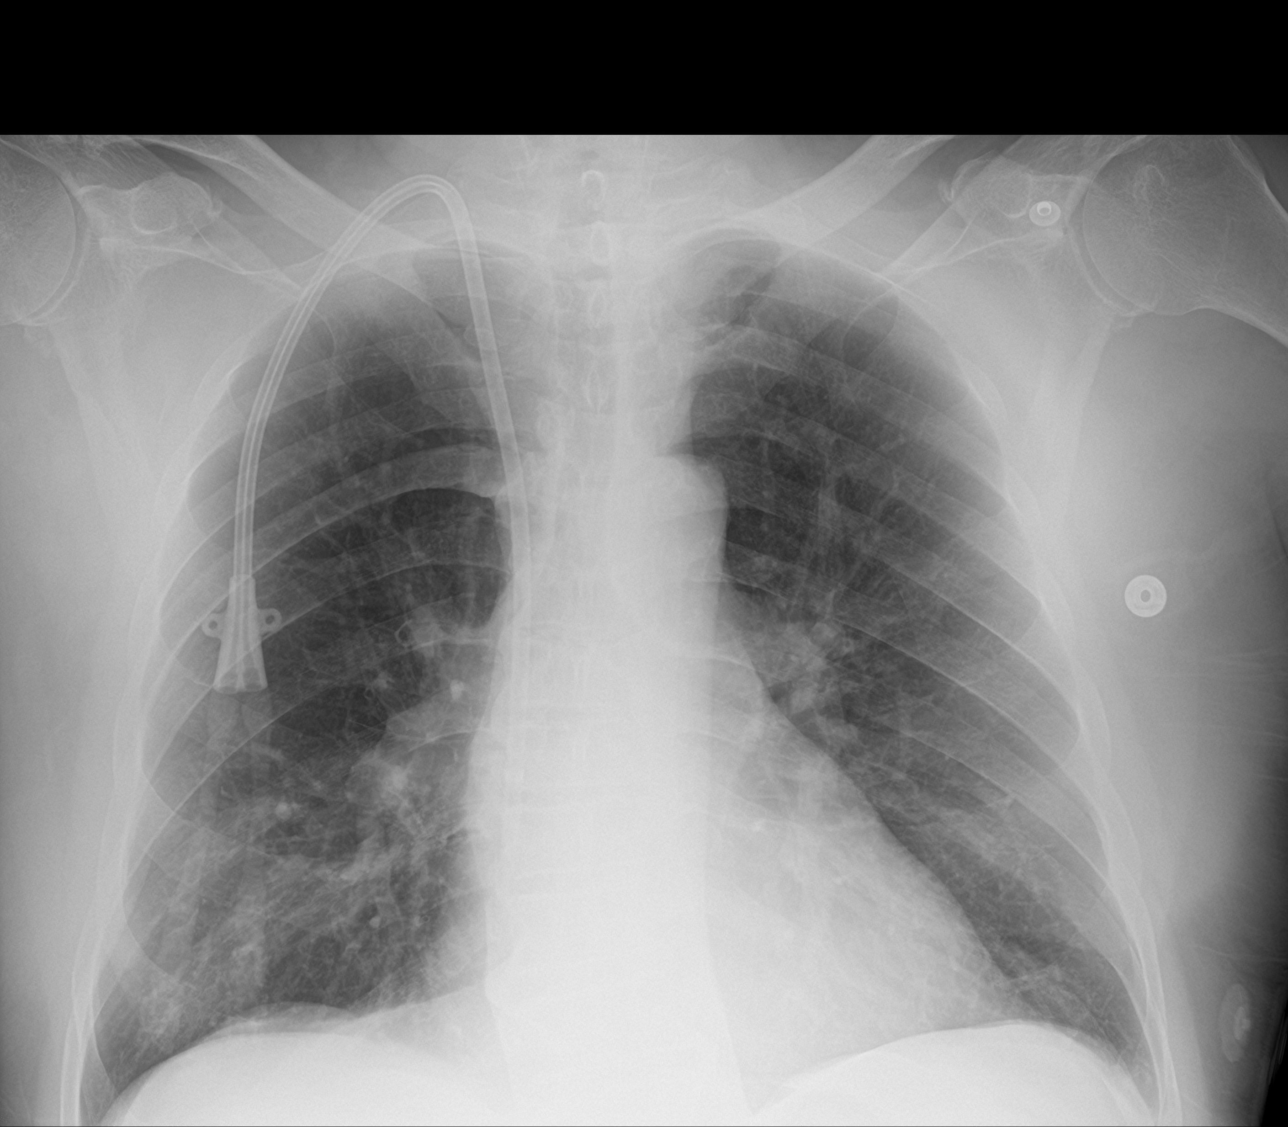

[chest ap (2 of 2)]
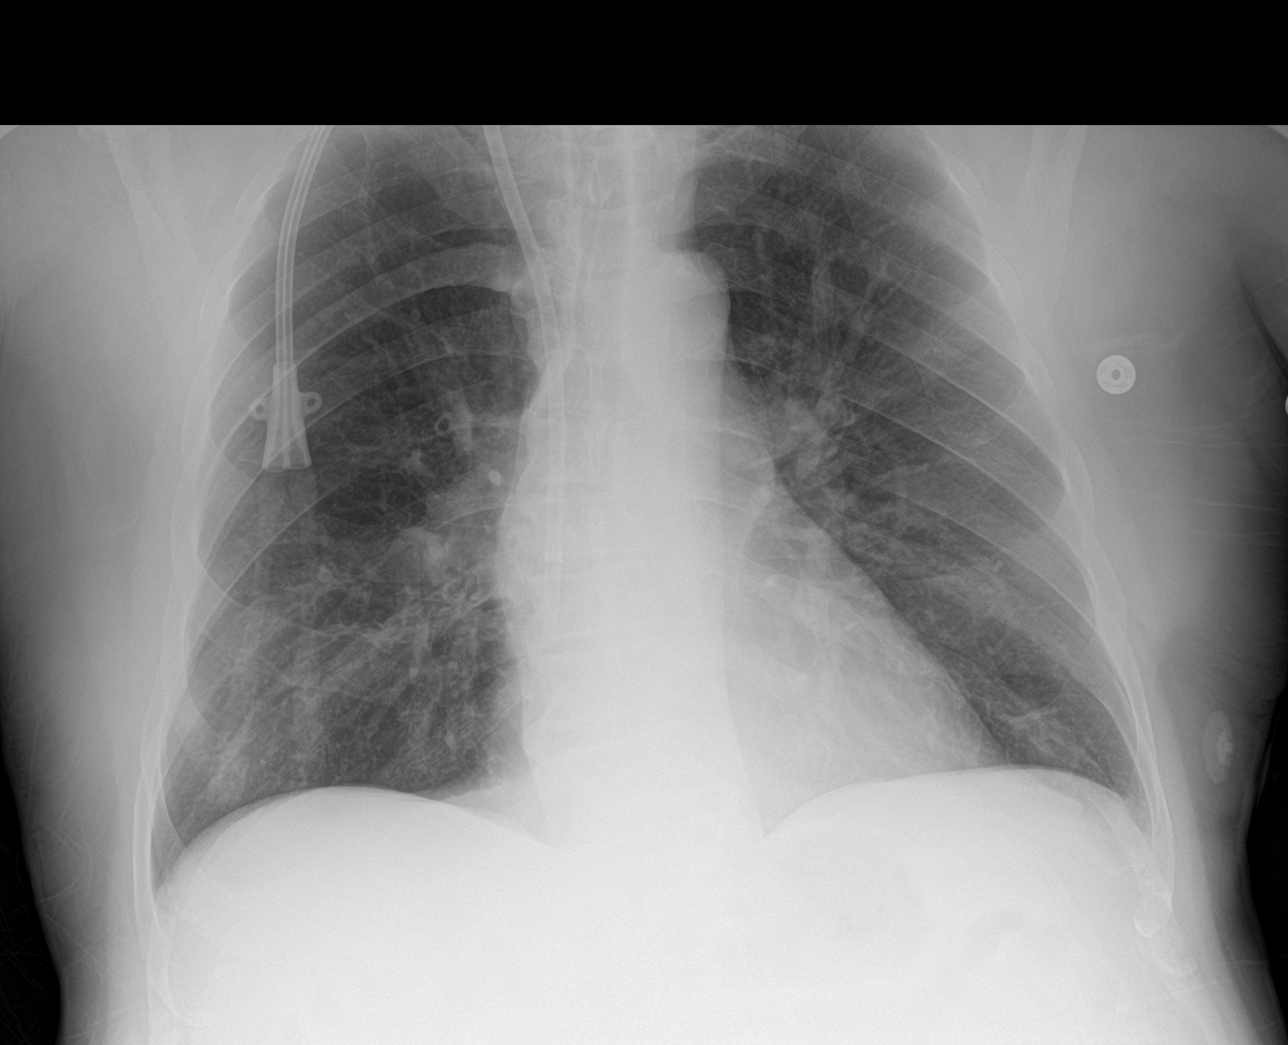

[3 of 3 positions shown; findings below may reference images not displayed]

FINDINGS: Interval placement of a dual-lumen right-sided central venous
catheter with the tip projecting over the cavoatrial junction. There
is no focal parenchymal opacity. There is no pleural effusion or
pneumothorax. The heart and mediastinal contours are unremarkable.

The osseous structures are unremarkable.
IMPRESSION: No active cardiopulmonary disease.

## 2019-08-19 IMAGING — DX DG CHEST 1V PORT
1 series · 1 of 1 positions shown · non-contrast
Comparison: 03/02/2016

CLINICAL DATA: Altered mental status. Patient found lying on floor
at home today pannus nonverbal to questions. Cough.

EXAM:
PORTABLE CHEST 1 VIEW

[chest ap]
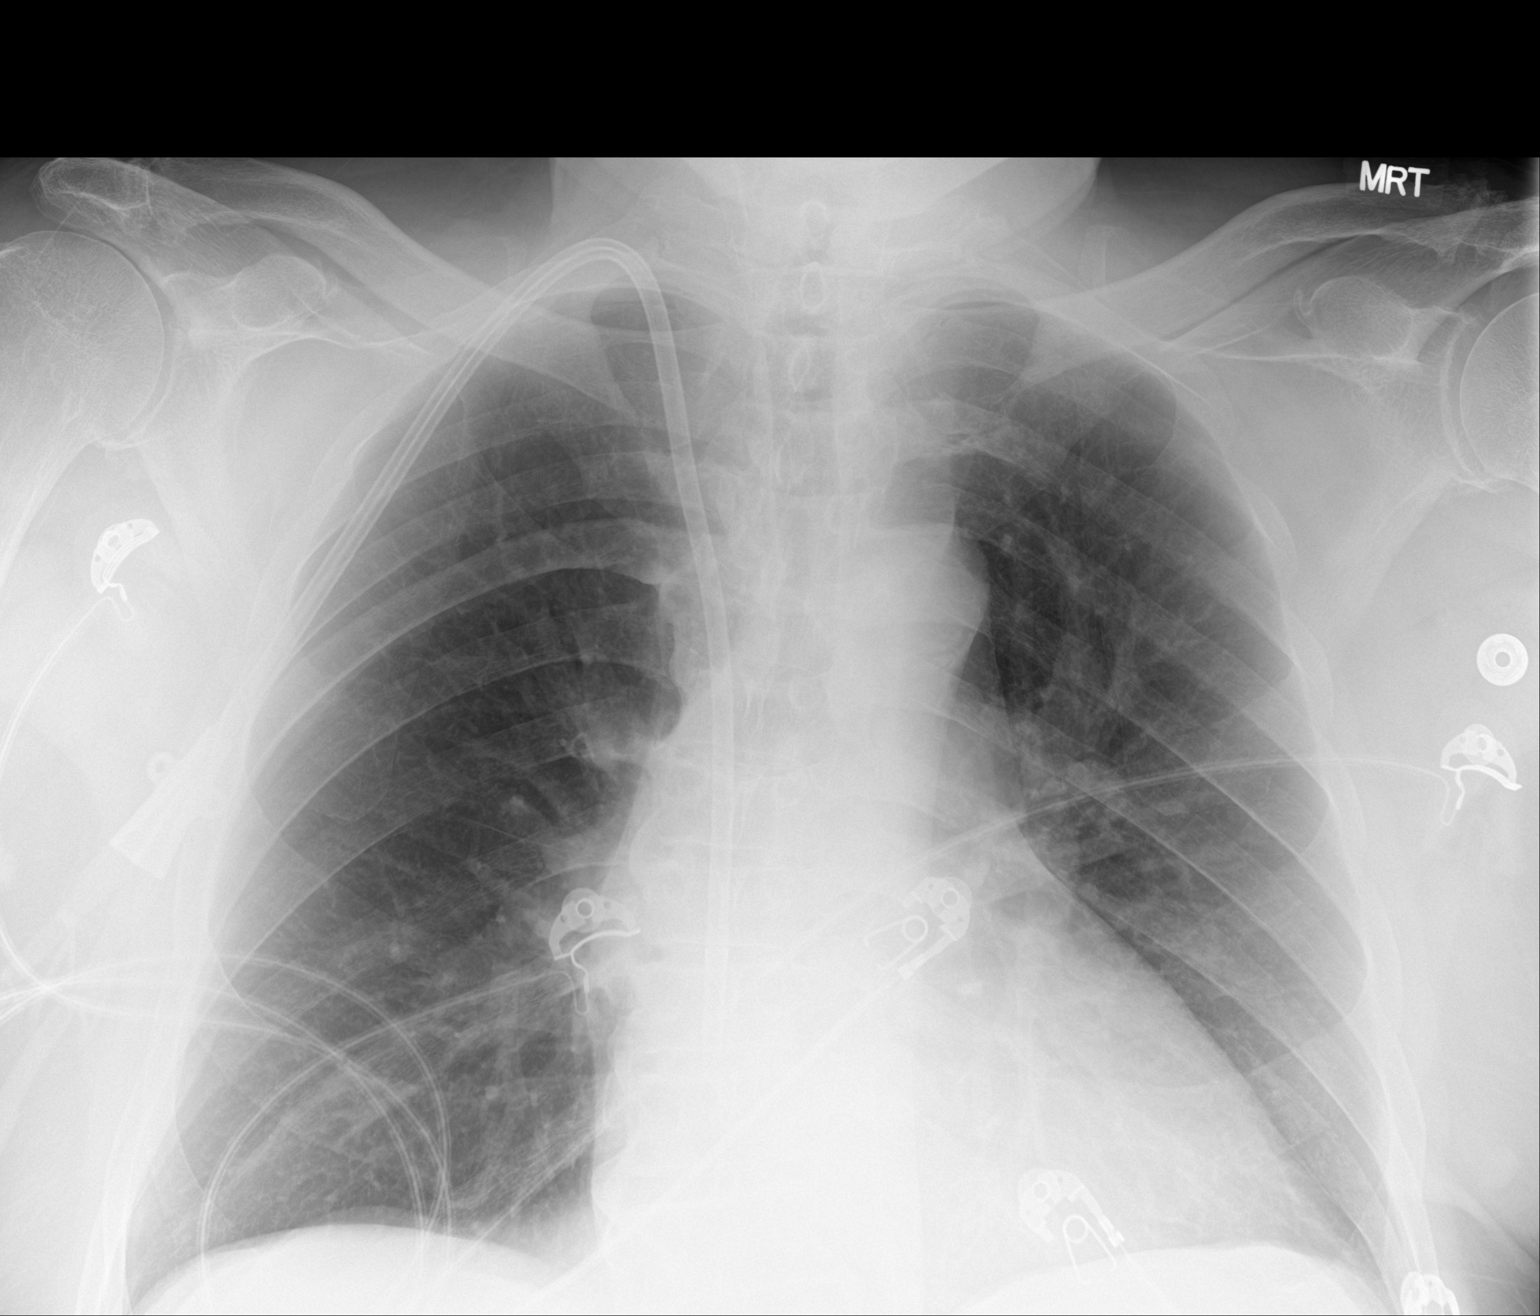

[1 of 1 positions shown; findings below may reference images not displayed]

FINDINGS: Stable cardiomegaly. Dialysis catheter tip from a right IJ approach
is noted in the proximal right atrium. No overt pulmonary edema. The
costophrenic angles are excluded on this study but no significant
pleural effusion is seen. There is no pneumothorax. No pneumonic
consolidation. No acute osseous abnormality. Osteoarthritis of both
AC and glenohumeral joints with spurring.
IMPRESSION: No active disease.

## 2019-08-31 IMAGING — DX DG KNEE 1-2V PORT*L*
2 series · 2 of 2 positions shown · non-contrast
Comparison: August 31, 2007

CLINICAL DATA: Pain after fall

EXAM:
PORTABLE LEFT KNEE - 1-2 VIEW

[knee ap]
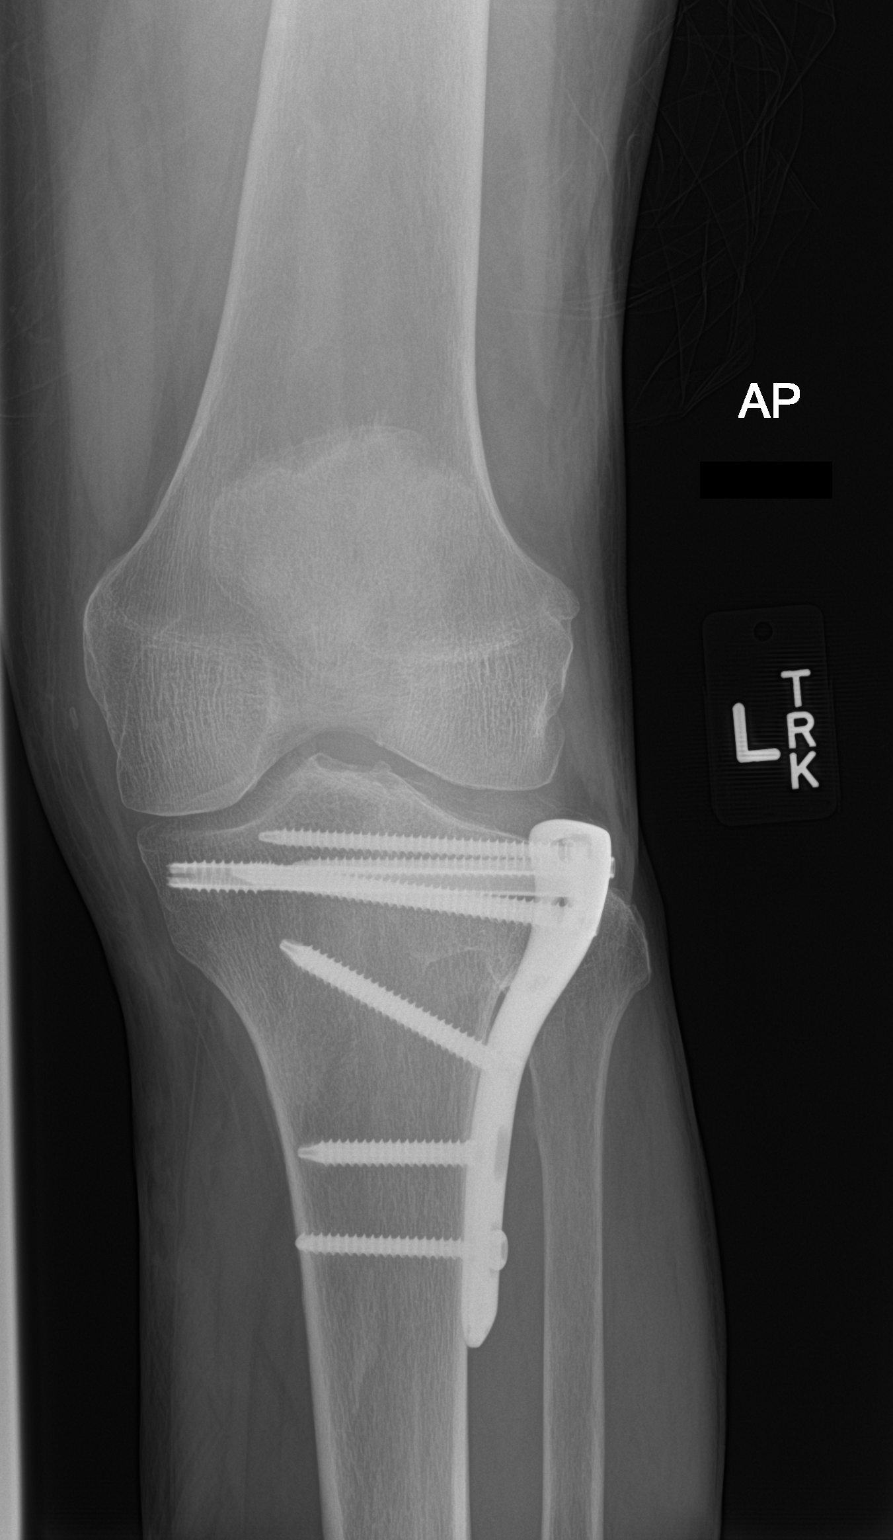

[knee lat]
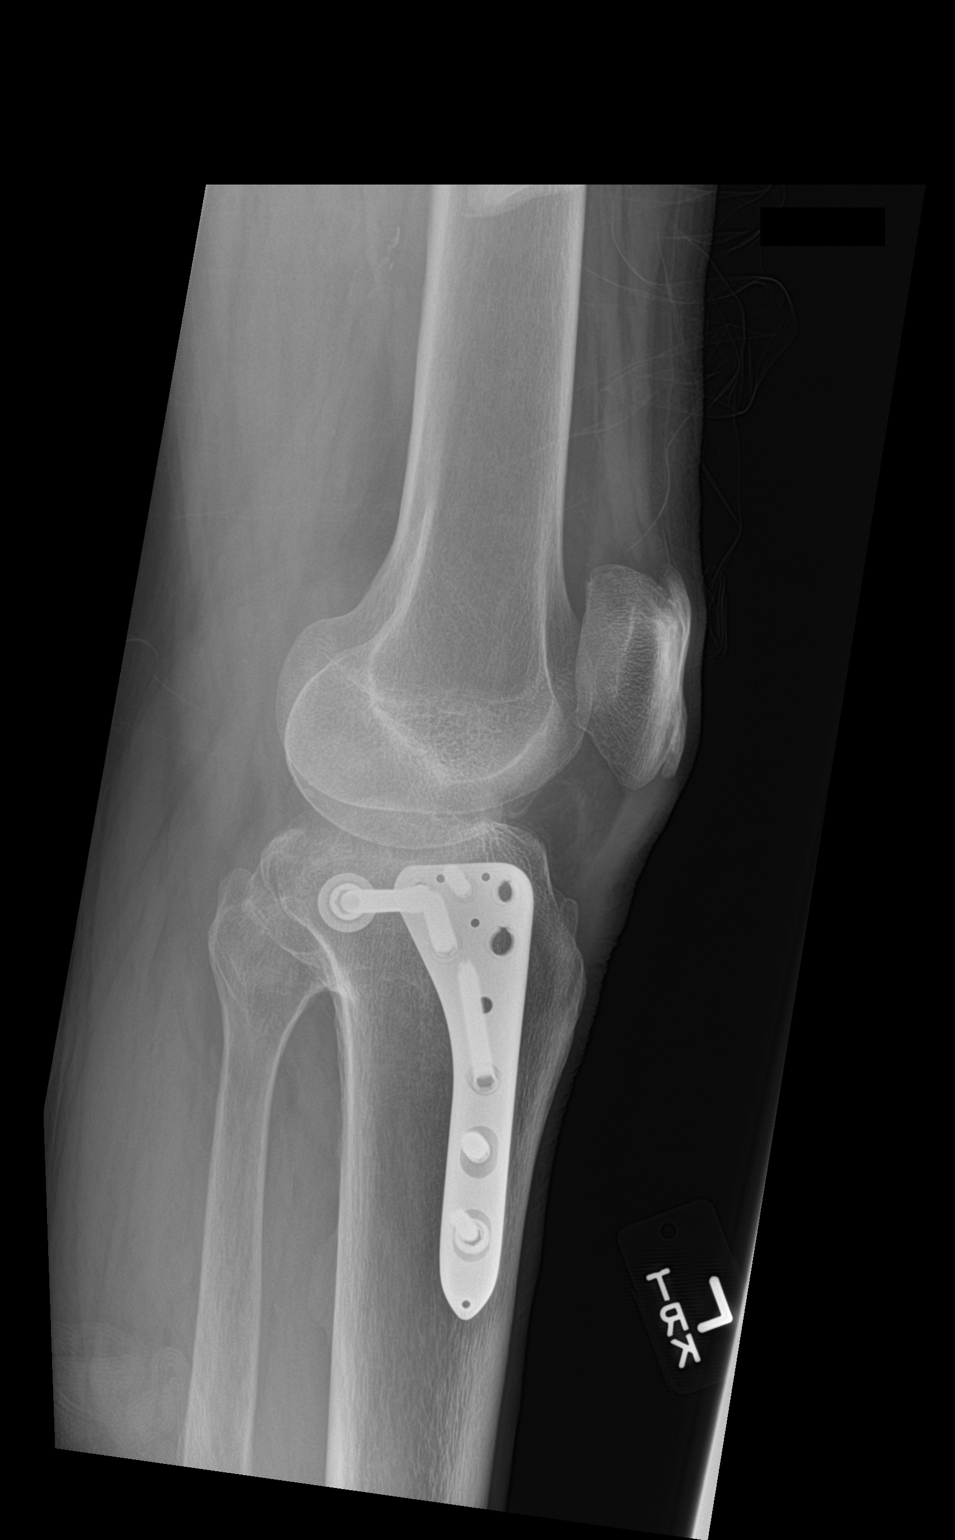

[2 of 2 positions shown; findings below may reference images not displayed]

FINDINGS: Plate and screws are affixed to the proximal lateral tibia.
Deformity of the proximal lateral tibia is at the site of a remote
tibial plateau fracture. The proximal tibia is otherwise normal. The
proximal fibula is normal. The distal femur is normal. No joint
effusion or lipohemarthrosis. A calcification in the soft tissues
medial to the distal femur are likely associated with a remote
medial collateral ligament injury.
IMPRESSION: 1. Deformity of the proximal medial tibia is at the site of a
previous tibial plateau fracture. Plate and screws cross this
region. The chronicity of the deformity is unclear. The lack of a
joint effusion or lipohemarthrosis is somewhat reassuring. However,
the sharp margins at the site of deformity and step-off make it
difficult to exclude an acute on chronic fracture in this location.
